# Patient Record
Sex: Male | Born: 1960 | Race: White | Hispanic: No | Smoking: Former smoker
Health system: Southern US, Community
[De-identification: ages and names within clinical notes are randomized; demographics above are authoritative.]

## PROBLEM LIST (undated history)

## (undated) DIAGNOSIS — K861 Other chronic pancreatitis: Secondary | ICD-10-CM

## (undated) DIAGNOSIS — L409 Psoriasis, unspecified: Secondary | ICD-10-CM

## (undated) DIAGNOSIS — K259 Gastric ulcer, unspecified as acute or chronic, without hemorrhage or perforation: Secondary | ICD-10-CM

## (undated) DIAGNOSIS — F329 Major depressive disorder, single episode, unspecified: Secondary | ICD-10-CM

## (undated) DIAGNOSIS — F1011 Alcohol abuse, in remission: Secondary | ICD-10-CM

## (undated) DIAGNOSIS — R29898 Other symptoms and signs involving the musculoskeletal system: Secondary | ICD-10-CM

## (undated) DIAGNOSIS — E785 Hyperlipidemia, unspecified: Secondary | ICD-10-CM

## (undated) DIAGNOSIS — F32A Depression, unspecified: Secondary | ICD-10-CM

## (undated) HISTORY — DX: Hyperlipidemia, unspecified: E78.5

## (undated) HISTORY — DX: Major depressive disorder, single episode, unspecified: F32.9

## (undated) HISTORY — DX: Psoriasis, unspecified: L40.9

## (undated) HISTORY — DX: Depression, unspecified: F32.A

## (undated) HISTORY — DX: Other chronic pancreatitis: K86.1

## (undated) HISTORY — DX: Alcohol abuse, in remission: F10.11

## (undated) HISTORY — DX: Other symptoms and signs involving the musculoskeletal system: R29.898

## (undated) HISTORY — PX: TYMPANOSTOMY TUBE PLACEMENT: SHX32

---

## 1997-11-15 ENCOUNTER — Inpatient Hospital Stay (HOSPITAL_COMMUNITY): Admission: EM | Admit: 1997-11-15 | Discharge: 1997-11-17 | Payer: Self-pay | Admitting: Emergency Medicine

## 1998-04-27 ENCOUNTER — Ambulatory Visit (HOSPITAL_COMMUNITY): Admission: RE | Admit: 1998-04-27 | Discharge: 1998-04-27 | Payer: Self-pay | Admitting: Family Medicine

## 1999-03-12 ENCOUNTER — Inpatient Hospital Stay (HOSPITAL_COMMUNITY): Admission: EM | Admit: 1999-03-12 | Discharge: 1999-03-18 | Payer: Self-pay | Admitting: Emergency Medicine

## 1999-03-12 ENCOUNTER — Encounter: Payer: Self-pay | Admitting: Emergency Medicine

## 1999-03-13 ENCOUNTER — Encounter: Payer: Self-pay | Admitting: Internal Medicine

## 1999-03-25 ENCOUNTER — Emergency Department (HOSPITAL_COMMUNITY): Admission: EM | Admit: 1999-03-25 | Discharge: 1999-03-25 | Payer: Self-pay

## 2000-02-13 ENCOUNTER — Inpatient Hospital Stay (HOSPITAL_COMMUNITY): Admission: EM | Admit: 2000-02-13 | Discharge: 2000-02-18 | Payer: Self-pay | Admitting: Emergency Medicine

## 2000-02-13 ENCOUNTER — Encounter: Payer: Self-pay | Admitting: Emergency Medicine

## 2000-02-26 ENCOUNTER — Emergency Department (HOSPITAL_COMMUNITY): Admission: EM | Admit: 2000-02-26 | Discharge: 2000-02-26 | Payer: Self-pay | Admitting: Emergency Medicine

## 2000-02-26 ENCOUNTER — Encounter: Payer: Self-pay | Admitting: Emergency Medicine

## 2000-02-28 ENCOUNTER — Encounter: Payer: Self-pay | Admitting: Internal Medicine

## 2000-02-28 ENCOUNTER — Inpatient Hospital Stay: Admission: EM | Admit: 2000-02-28 | Discharge: 2000-03-04 | Payer: Self-pay | Admitting: *Deleted

## 2000-03-21 ENCOUNTER — Inpatient Hospital Stay (HOSPITAL_COMMUNITY): Admission: EM | Admit: 2000-03-21 | Discharge: 2000-03-24 | Payer: Self-pay | Admitting: Emergency Medicine

## 2000-03-21 ENCOUNTER — Encounter: Payer: Self-pay | Admitting: Emergency Medicine

## 2000-03-25 ENCOUNTER — Emergency Department (HOSPITAL_COMMUNITY): Admission: EM | Admit: 2000-03-25 | Discharge: 2000-03-25 | Payer: Self-pay | Admitting: *Deleted

## 2000-05-19 ENCOUNTER — Inpatient Hospital Stay (HOSPITAL_COMMUNITY): Admission: EM | Admit: 2000-05-19 | Discharge: 2000-05-23 | Payer: Self-pay | Admitting: Emergency Medicine

## 2000-05-19 ENCOUNTER — Encounter: Payer: Self-pay | Admitting: Emergency Medicine

## 2000-05-20 ENCOUNTER — Encounter: Payer: Self-pay | Admitting: Internal Medicine

## 2000-05-22 ENCOUNTER — Encounter: Payer: Self-pay | Admitting: Internal Medicine

## 2000-07-13 ENCOUNTER — Encounter: Payer: Self-pay | Admitting: Emergency Medicine

## 2000-07-13 ENCOUNTER — Inpatient Hospital Stay (HOSPITAL_COMMUNITY): Admission: EM | Admit: 2000-07-13 | Discharge: 2000-07-16 | Payer: Self-pay | Admitting: Emergency Medicine

## 2000-07-19 ENCOUNTER — Inpatient Hospital Stay (HOSPITAL_COMMUNITY): Admission: EM | Admit: 2000-07-19 | Discharge: 2000-07-23 | Payer: Self-pay

## 2001-02-06 ENCOUNTER — Inpatient Hospital Stay (HOSPITAL_COMMUNITY): Admission: EM | Admit: 2001-02-06 | Discharge: 2001-02-10 | Payer: Self-pay | Admitting: Family Medicine

## 2001-02-17 ENCOUNTER — Inpatient Hospital Stay (HOSPITAL_COMMUNITY): Admission: EM | Admit: 2001-02-17 | Discharge: 2001-02-22 | Payer: Self-pay | Admitting: Emergency Medicine

## 2001-02-18 ENCOUNTER — Encounter: Payer: Self-pay | Admitting: Gastroenterology

## 2001-05-24 ENCOUNTER — Inpatient Hospital Stay (HOSPITAL_COMMUNITY): Admission: EM | Admit: 2001-05-24 | Discharge: 2001-05-27 | Payer: Self-pay | Admitting: Emergency Medicine

## 2001-06-20 ENCOUNTER — Emergency Department (HOSPITAL_COMMUNITY): Admission: EM | Admit: 2001-06-20 | Discharge: 2001-06-20 | Payer: Self-pay | Admitting: Emergency Medicine

## 2001-06-24 ENCOUNTER — Emergency Department (HOSPITAL_COMMUNITY): Admission: EM | Admit: 2001-06-24 | Discharge: 2001-06-25 | Payer: Self-pay | Admitting: Emergency Medicine

## 2001-07-06 ENCOUNTER — Inpatient Hospital Stay (HOSPITAL_COMMUNITY): Admission: EM | Admit: 2001-07-06 | Discharge: 2001-07-08 | Payer: Self-pay | Admitting: Psychiatry

## 2001-07-09 ENCOUNTER — Inpatient Hospital Stay (HOSPITAL_COMMUNITY): Admission: EM | Admit: 2001-07-09 | Discharge: 2001-07-19 | Payer: Self-pay | Admitting: *Deleted

## 2001-11-26 ENCOUNTER — Emergency Department (HOSPITAL_COMMUNITY): Admission: EM | Admit: 2001-11-26 | Discharge: 2001-11-26 | Payer: Self-pay | Admitting: Emergency Medicine

## 2002-01-13 ENCOUNTER — Emergency Department (HOSPITAL_COMMUNITY): Admission: EM | Admit: 2002-01-13 | Discharge: 2002-01-14 | Payer: Self-pay | Admitting: Emergency Medicine

## 2004-01-25 ENCOUNTER — Emergency Department (HOSPITAL_COMMUNITY): Admission: EM | Admit: 2004-01-25 | Discharge: 2004-01-25 | Payer: Self-pay | Admitting: Emergency Medicine

## 2004-02-16 ENCOUNTER — Emergency Department (HOSPITAL_COMMUNITY): Admission: EM | Admit: 2004-02-16 | Discharge: 2004-02-16 | Payer: Self-pay | Admitting: *Deleted

## 2004-03-10 ENCOUNTER — Emergency Department (HOSPITAL_COMMUNITY): Admission: EM | Admit: 2004-03-10 | Discharge: 2004-03-10 | Payer: Self-pay | Admitting: Emergency Medicine

## 2004-03-19 ENCOUNTER — Emergency Department (HOSPITAL_COMMUNITY): Admission: EM | Admit: 2004-03-19 | Discharge: 2004-03-19 | Payer: Self-pay | Admitting: Emergency Medicine

## 2004-06-18 ENCOUNTER — Emergency Department (HOSPITAL_COMMUNITY): Admission: EM | Admit: 2004-06-18 | Discharge: 2004-06-19 | Payer: Self-pay | Admitting: Emergency Medicine

## 2004-07-12 ENCOUNTER — Inpatient Hospital Stay (HOSPITAL_COMMUNITY): Admission: EM | Admit: 2004-07-12 | Discharge: 2004-07-19 | Payer: Self-pay | Admitting: Emergency Medicine

## 2004-07-25 ENCOUNTER — Inpatient Hospital Stay (HOSPITAL_COMMUNITY): Admission: EM | Admit: 2004-07-25 | Discharge: 2004-08-02 | Payer: Self-pay | Admitting: Emergency Medicine

## 2004-09-13 ENCOUNTER — Inpatient Hospital Stay (HOSPITAL_COMMUNITY): Admission: EM | Admit: 2004-09-13 | Discharge: 2004-09-15 | Payer: Self-pay | Admitting: Emergency Medicine

## 2004-10-06 ENCOUNTER — Emergency Department (HOSPITAL_COMMUNITY): Admission: EM | Admit: 2004-10-06 | Discharge: 2004-10-06 | Payer: Self-pay | Admitting: Emergency Medicine

## 2005-04-10 ENCOUNTER — Emergency Department (HOSPITAL_COMMUNITY): Admission: EM | Admit: 2005-04-10 | Discharge: 2005-04-11 | Payer: Self-pay | Admitting: Emergency Medicine

## 2005-06-25 ENCOUNTER — Emergency Department (HOSPITAL_COMMUNITY): Admission: EM | Admit: 2005-06-25 | Discharge: 2005-06-26 | Payer: Self-pay | Admitting: Emergency Medicine

## 2005-06-28 ENCOUNTER — Inpatient Hospital Stay (HOSPITAL_COMMUNITY): Admission: EM | Admit: 2005-06-28 | Discharge: 2005-07-02 | Payer: Self-pay | Admitting: Emergency Medicine

## 2005-06-28 ENCOUNTER — Encounter: Payer: Self-pay | Admitting: Internal Medicine

## 2005-07-01 ENCOUNTER — Ambulatory Visit: Payer: Self-pay | Admitting: Gastroenterology

## 2005-12-15 ENCOUNTER — Emergency Department (HOSPITAL_COMMUNITY): Admission: EM | Admit: 2005-12-15 | Discharge: 2005-12-15 | Payer: Self-pay | Admitting: Emergency Medicine

## 2005-12-17 ENCOUNTER — Inpatient Hospital Stay (HOSPITAL_COMMUNITY): Admission: EM | Admit: 2005-12-17 | Discharge: 2005-12-25 | Payer: Self-pay | Admitting: Emergency Medicine

## 2005-12-27 ENCOUNTER — Ambulatory Visit: Payer: Self-pay | Admitting: Family Medicine

## 2005-12-31 ENCOUNTER — Ambulatory Visit: Payer: Self-pay | Admitting: *Deleted

## 2006-05-27 ENCOUNTER — Ambulatory Visit (HOSPITAL_COMMUNITY): Admission: RE | Admit: 2006-05-27 | Discharge: 2006-05-27 | Payer: Self-pay | Admitting: *Deleted

## 2006-07-25 ENCOUNTER — Inpatient Hospital Stay (HOSPITAL_COMMUNITY): Admission: EM | Admit: 2006-07-25 | Discharge: 2006-08-01 | Payer: Self-pay | Admitting: Emergency Medicine

## 2006-07-25 ENCOUNTER — Ambulatory Visit: Payer: Self-pay | Admitting: *Deleted

## 2006-07-31 ENCOUNTER — Ambulatory Visit: Payer: Self-pay | Admitting: Gastroenterology

## 2006-12-28 ENCOUNTER — Emergency Department (HOSPITAL_COMMUNITY): Admission: EM | Admit: 2006-12-28 | Discharge: 2006-12-28 | Payer: Self-pay | Admitting: Emergency Medicine

## 2007-01-03 ENCOUNTER — Emergency Department (HOSPITAL_COMMUNITY): Admission: EM | Admit: 2007-01-03 | Discharge: 2007-01-03 | Payer: Self-pay | Admitting: Emergency Medicine

## 2007-04-23 DIAGNOSIS — F1010 Alcohol abuse, uncomplicated: Secondary | ICD-10-CM | POA: Insufficient documentation

## 2007-04-23 DIAGNOSIS — Z8719 Personal history of other diseases of the digestive system: Secondary | ICD-10-CM | POA: Insufficient documentation

## 2007-05-24 ENCOUNTER — Emergency Department (HOSPITAL_COMMUNITY): Admission: EM | Admit: 2007-05-24 | Discharge: 2007-05-25 | Payer: Self-pay | Admitting: Emergency Medicine

## 2007-07-26 ENCOUNTER — Emergency Department (HOSPITAL_COMMUNITY): Admission: EM | Admit: 2007-07-26 | Discharge: 2007-07-27 | Payer: Self-pay | Admitting: Emergency Medicine

## 2007-08-03 ENCOUNTER — Ambulatory Visit: Payer: Self-pay | Admitting: *Deleted

## 2007-08-03 ENCOUNTER — Inpatient Hospital Stay (HOSPITAL_COMMUNITY): Admission: EM | Admit: 2007-08-03 | Discharge: 2007-08-06 | Payer: Self-pay | Admitting: Emergency Medicine

## 2007-08-05 ENCOUNTER — Encounter (INDEPENDENT_AMBULATORY_CARE_PROVIDER_SITE_OTHER): Payer: Self-pay | Admitting: Internal Medicine

## 2007-08-12 ENCOUNTER — Ambulatory Visit: Payer: Self-pay | Admitting: Internal Medicine

## 2007-08-12 ENCOUNTER — Encounter (INDEPENDENT_AMBULATORY_CARE_PROVIDER_SITE_OTHER): Payer: Self-pay | Admitting: Internal Medicine

## 2007-08-13 DIAGNOSIS — E785 Hyperlipidemia, unspecified: Secondary | ICD-10-CM | POA: Insufficient documentation

## 2007-09-09 ENCOUNTER — Telehealth (INDEPENDENT_AMBULATORY_CARE_PROVIDER_SITE_OTHER): Payer: Self-pay | Admitting: Internal Medicine

## 2007-10-06 ENCOUNTER — Telehealth: Payer: Self-pay | Admitting: *Deleted

## 2007-10-08 ENCOUNTER — Emergency Department (HOSPITAL_COMMUNITY): Admission: EM | Admit: 2007-10-08 | Discharge: 2007-10-08 | Payer: Self-pay | Admitting: Emergency Medicine

## 2007-10-31 ENCOUNTER — Emergency Department (HOSPITAL_COMMUNITY): Admission: EM | Admit: 2007-10-31 | Discharge: 2007-11-01 | Payer: Self-pay | Admitting: Emergency Medicine

## 2007-11-02 ENCOUNTER — Ambulatory Visit: Payer: Self-pay | Admitting: *Deleted

## 2007-11-02 ENCOUNTER — Encounter (INDEPENDENT_AMBULATORY_CARE_PROVIDER_SITE_OTHER): Payer: Self-pay | Admitting: Internal Medicine

## 2007-11-18 ENCOUNTER — Emergency Department (HOSPITAL_COMMUNITY): Admission: EM | Admit: 2007-11-18 | Discharge: 2007-11-18 | Payer: Self-pay | Admitting: Emergency Medicine

## 2007-11-26 ENCOUNTER — Encounter (INDEPENDENT_AMBULATORY_CARE_PROVIDER_SITE_OTHER): Payer: Self-pay | Admitting: Internal Medicine

## 2007-11-26 ENCOUNTER — Ambulatory Visit: Payer: Self-pay | Admitting: Internal Medicine

## 2007-11-26 DIAGNOSIS — Z8719 Personal history of other diseases of the digestive system: Secondary | ICD-10-CM

## 2007-11-28 LAB — CONVERTED CEMR LAB
Cholesterol: 209 mg/dL — ABNORMAL HIGH (ref 0–200)
HDL: 44 mg/dL (ref >39)
LDL Cholesterol: 111 mg/dL — ABNORMAL HIGH (ref 0–99)
Triglycerides: 269 mg/dL — ABNORMAL HIGH (ref <150)

## 2007-12-01 ENCOUNTER — Telehealth (INDEPENDENT_AMBULATORY_CARE_PROVIDER_SITE_OTHER): Payer: Self-pay | Admitting: Internal Medicine

## 2007-12-03 ENCOUNTER — Ambulatory Visit (HOSPITAL_COMMUNITY): Admission: RE | Admit: 2007-12-03 | Discharge: 2007-12-03 | Payer: Self-pay | Admitting: Internal Medicine

## 2007-12-09 ENCOUNTER — Encounter (INDEPENDENT_AMBULATORY_CARE_PROVIDER_SITE_OTHER): Payer: Self-pay | Admitting: Internal Medicine

## 2007-12-09 ENCOUNTER — Ambulatory Visit: Payer: Self-pay | Admitting: *Deleted

## 2007-12-10 LAB — CONVERTED CEMR LAB: OCCULT 2: NEGATIVE

## 2007-12-21 ENCOUNTER — Telehealth: Payer: Self-pay | Admitting: *Deleted

## 2007-12-24 ENCOUNTER — Telehealth (INDEPENDENT_AMBULATORY_CARE_PROVIDER_SITE_OTHER): Payer: Self-pay | Admitting: Internal Medicine

## 2007-12-28 ENCOUNTER — Encounter (INDEPENDENT_AMBULATORY_CARE_PROVIDER_SITE_OTHER): Payer: Self-pay | Admitting: Internal Medicine

## 2007-12-28 ENCOUNTER — Ambulatory Visit: Payer: Self-pay | Admitting: Infectious Diseases

## 2008-01-15 ENCOUNTER — Emergency Department (HOSPITAL_COMMUNITY): Admission: EM | Admit: 2008-01-15 | Discharge: 2008-01-15 | Payer: Self-pay | Admitting: Emergency Medicine

## 2008-01-18 ENCOUNTER — Ambulatory Visit: Payer: Self-pay | Admitting: Gastroenterology

## 2008-01-18 ENCOUNTER — Encounter (INDEPENDENT_AMBULATORY_CARE_PROVIDER_SITE_OTHER): Payer: Self-pay | Admitting: Internal Medicine

## 2008-01-18 DIAGNOSIS — R195 Other fecal abnormalities: Secondary | ICD-10-CM

## 2008-01-20 ENCOUNTER — Telehealth (INDEPENDENT_AMBULATORY_CARE_PROVIDER_SITE_OTHER): Payer: Self-pay | Admitting: Internal Medicine

## 2008-01-22 ENCOUNTER — Encounter: Payer: Self-pay | Admitting: Internal Medicine

## 2008-01-26 ENCOUNTER — Telehealth (INDEPENDENT_AMBULATORY_CARE_PROVIDER_SITE_OTHER): Payer: Self-pay | Admitting: Internal Medicine

## 2008-01-26 ENCOUNTER — Encounter (INDEPENDENT_AMBULATORY_CARE_PROVIDER_SITE_OTHER): Payer: Self-pay | Admitting: Internal Medicine

## 2008-01-28 ENCOUNTER — Ambulatory Visit: Payer: Self-pay | Admitting: Gastroenterology

## 2008-02-10 ENCOUNTER — Ambulatory Visit: Payer: Self-pay | Admitting: Gastroenterology

## 2008-02-12 LAB — CONVERTED CEMR LAB
Fecal Occult Blood: NEGATIVE
OCCULT 2: POSITIVE — AB

## 2008-02-15 ENCOUNTER — Encounter: Payer: Self-pay | Admitting: Gastroenterology

## 2008-02-15 ENCOUNTER — Ambulatory Visit: Payer: Self-pay | Admitting: Gastroenterology

## 2008-02-15 ENCOUNTER — Telehealth (INDEPENDENT_AMBULATORY_CARE_PROVIDER_SITE_OTHER): Payer: Self-pay | Admitting: Internal Medicine

## 2008-02-15 LAB — CONVERTED CEMR LAB
Basophils Relative: 0.6 % (ref 0.0–3.0)
Eosinophils Relative: 2.1 % (ref 0.0–5.0)
HCT: 38.2 % — ABNORMAL LOW (ref 39.0–52.0)
Hemoglobin: 13.6 g/dL (ref 13.0–17.0)
Monocytes Absolute: 0.4 10*3/uL (ref 0.1–1.0)
Monocytes Relative: 3.7 % (ref 3.0–12.0)
Neutro Abs: 8.2 10*3/uL — ABNORMAL HIGH (ref 1.4–7.7)
RBC: 4.27 M/uL (ref 4.22–5.81)
WBC: 11.5 10*3/uL — ABNORMAL HIGH (ref 4.5–10.5)

## 2008-02-19 ENCOUNTER — Encounter: Payer: Self-pay | Admitting: Gastroenterology

## 2008-02-22 ENCOUNTER — Encounter (INDEPENDENT_AMBULATORY_CARE_PROVIDER_SITE_OTHER): Payer: Self-pay | Admitting: Internal Medicine

## 2008-02-24 ENCOUNTER — Ambulatory Visit: Payer: Self-pay | Admitting: Internal Medicine

## 2008-02-24 ENCOUNTER — Encounter (INDEPENDENT_AMBULATORY_CARE_PROVIDER_SITE_OTHER): Payer: Self-pay | Admitting: Internal Medicine

## 2008-02-24 DIAGNOSIS — K2960 Other gastritis without bleeding: Secondary | ICD-10-CM

## 2008-03-01 ENCOUNTER — Telehealth: Payer: Self-pay | Admitting: Licensed Clinical Social Worker

## 2008-03-07 ENCOUNTER — Telehealth: Payer: Self-pay | Admitting: Licensed Clinical Social Worker

## 2008-03-16 ENCOUNTER — Ambulatory Visit: Payer: Self-pay | Admitting: Internal Medicine

## 2008-03-16 ENCOUNTER — Inpatient Hospital Stay (HOSPITAL_COMMUNITY): Admission: EM | Admit: 2008-03-16 | Discharge: 2008-03-21 | Payer: Self-pay | Admitting: Emergency Medicine

## 2008-03-22 ENCOUNTER — Encounter (INDEPENDENT_AMBULATORY_CARE_PROVIDER_SITE_OTHER): Payer: Self-pay | Admitting: Internal Medicine

## 2008-03-22 ENCOUNTER — Telehealth (INDEPENDENT_AMBULATORY_CARE_PROVIDER_SITE_OTHER): Payer: Self-pay | Admitting: Internal Medicine

## 2008-03-29 ENCOUNTER — Telehealth: Payer: Self-pay | Admitting: Infectious Disease

## 2008-03-30 ENCOUNTER — Encounter: Payer: Self-pay | Admitting: Infectious Disease

## 2008-04-13 ENCOUNTER — Ambulatory Visit: Payer: Self-pay | Admitting: Internal Medicine

## 2008-04-13 ENCOUNTER — Encounter (INDEPENDENT_AMBULATORY_CARE_PROVIDER_SITE_OTHER): Payer: Self-pay | Admitting: Internal Medicine

## 2008-04-13 DIAGNOSIS — F10929 Alcohol use, unspecified with intoxication, unspecified: Secondary | ICD-10-CM

## 2008-04-13 DIAGNOSIS — K860 Alcohol-induced chronic pancreatitis: Secondary | ICD-10-CM

## 2008-04-29 ENCOUNTER — Inpatient Hospital Stay (HOSPITAL_COMMUNITY): Admission: EM | Admit: 2008-04-29 | Discharge: 2008-05-03 | Payer: Self-pay | Admitting: Emergency Medicine

## 2008-04-29 ENCOUNTER — Ambulatory Visit: Payer: Self-pay | Admitting: Internal Medicine

## 2008-05-17 ENCOUNTER — Encounter (INDEPENDENT_AMBULATORY_CARE_PROVIDER_SITE_OTHER): Payer: Self-pay | Admitting: Internal Medicine

## 2008-05-17 ENCOUNTER — Ambulatory Visit: Payer: Self-pay | Admitting: *Deleted

## 2008-05-17 LAB — CONVERTED CEMR LAB
BUN: 11 mg/dL (ref 6–23)
CO2: 18 meq/L — ABNORMAL LOW (ref 19–32)
Calcium: 9.2 mg/dL (ref 8.4–10.5)
Calcium: 9.4 mg/dL (ref 8.4–10.5)
Creatinine, Ser: 0.72 mg/dL (ref 0.40–1.50)
Glucose, Bld: 130 mg/dL — ABNORMAL HIGH (ref 70–99)
Sodium: 139 meq/L (ref 135–145)

## 2008-06-02 ENCOUNTER — Ambulatory Visit: Payer: Self-pay | Admitting: Internal Medicine

## 2008-06-02 ENCOUNTER — Encounter (INDEPENDENT_AMBULATORY_CARE_PROVIDER_SITE_OTHER): Payer: Self-pay | Admitting: Internal Medicine

## 2008-06-02 LAB — CONVERTED CEMR LAB
CO2: 19 meq/L (ref 19–32)
Calcium: 9.8 mg/dL (ref 8.4–10.5)
Chloride: 105 meq/L (ref 96–112)
Sodium: 141 meq/L (ref 135–145)

## 2008-06-08 ENCOUNTER — Encounter (INDEPENDENT_AMBULATORY_CARE_PROVIDER_SITE_OTHER): Payer: Self-pay | Admitting: Internal Medicine

## 2008-06-09 ENCOUNTER — Ambulatory Visit: Payer: Self-pay | Admitting: Internal Medicine

## 2008-06-15 ENCOUNTER — Telehealth (INDEPENDENT_AMBULATORY_CARE_PROVIDER_SITE_OTHER): Payer: Self-pay | Admitting: Internal Medicine

## 2008-06-28 ENCOUNTER — Emergency Department (HOSPITAL_COMMUNITY): Admission: EM | Admit: 2008-06-28 | Discharge: 2008-06-28 | Payer: Self-pay | Admitting: Emergency Medicine

## 2008-06-30 ENCOUNTER — Telehealth (INDEPENDENT_AMBULATORY_CARE_PROVIDER_SITE_OTHER): Payer: Self-pay | Admitting: Internal Medicine

## 2008-06-30 ENCOUNTER — Encounter (INDEPENDENT_AMBULATORY_CARE_PROVIDER_SITE_OTHER): Payer: Self-pay | Admitting: Internal Medicine

## 2008-07-13 ENCOUNTER — Telehealth: Payer: Self-pay | Admitting: *Deleted

## 2008-07-16 ENCOUNTER — Inpatient Hospital Stay (HOSPITAL_COMMUNITY): Admission: EM | Admit: 2008-07-16 | Discharge: 2008-07-21 | Payer: Self-pay | Admitting: Emergency Medicine

## 2008-07-16 ENCOUNTER — Ambulatory Visit: Payer: Self-pay | Admitting: *Deleted

## 2008-07-21 ENCOUNTER — Encounter: Payer: Self-pay | Admitting: Internal Medicine

## 2008-08-01 ENCOUNTER — Encounter: Payer: Self-pay | Admitting: Internal Medicine

## 2008-08-01 ENCOUNTER — Telehealth: Payer: Self-pay | Admitting: Internal Medicine

## 2008-08-02 ENCOUNTER — Encounter: Payer: Self-pay | Admitting: *Deleted

## 2008-08-18 ENCOUNTER — Telehealth (INDEPENDENT_AMBULATORY_CARE_PROVIDER_SITE_OTHER): Payer: Self-pay | Admitting: *Deleted

## 2008-08-18 ENCOUNTER — Emergency Department (HOSPITAL_COMMUNITY): Admission: EM | Admit: 2008-08-18 | Discharge: 2008-08-18 | Payer: Self-pay | Admitting: Emergency Medicine

## 2008-08-18 ENCOUNTER — Encounter (INDEPENDENT_AMBULATORY_CARE_PROVIDER_SITE_OTHER): Payer: Self-pay | Admitting: Internal Medicine

## 2008-08-25 ENCOUNTER — Telehealth: Payer: Self-pay | Admitting: *Deleted

## 2008-08-27 ENCOUNTER — Telehealth: Payer: Self-pay | Admitting: Internal Medicine

## 2008-08-27 ENCOUNTER — Emergency Department (HOSPITAL_COMMUNITY): Admission: EM | Admit: 2008-08-27 | Discharge: 2008-08-27 | Payer: Self-pay | Admitting: Emergency Medicine

## 2008-08-29 ENCOUNTER — Ambulatory Visit: Payer: Self-pay | Admitting: *Deleted

## 2008-08-29 ENCOUNTER — Encounter (INDEPENDENT_AMBULATORY_CARE_PROVIDER_SITE_OTHER): Payer: Self-pay | Admitting: Internal Medicine

## 2008-08-29 DIAGNOSIS — F419 Anxiety disorder, unspecified: Secondary | ICD-10-CM | POA: Insufficient documentation

## 2008-08-29 DIAGNOSIS — F411 Generalized anxiety disorder: Secondary | ICD-10-CM | POA: Insufficient documentation

## 2008-09-16 ENCOUNTER — Telehealth (INDEPENDENT_AMBULATORY_CARE_PROVIDER_SITE_OTHER): Payer: Self-pay | Admitting: Internal Medicine

## 2008-09-16 ENCOUNTER — Encounter (INDEPENDENT_AMBULATORY_CARE_PROVIDER_SITE_OTHER): Payer: Self-pay | Admitting: Internal Medicine

## 2008-09-19 ENCOUNTER — Encounter (INDEPENDENT_AMBULATORY_CARE_PROVIDER_SITE_OTHER): Payer: Self-pay | Admitting: Internal Medicine

## 2008-09-19 ENCOUNTER — Ambulatory Visit: Payer: Self-pay | Admitting: Internal Medicine

## 2008-10-03 ENCOUNTER — Telehealth: Payer: Self-pay | Admitting: *Deleted

## 2008-10-20 ENCOUNTER — Telehealth (INDEPENDENT_AMBULATORY_CARE_PROVIDER_SITE_OTHER): Payer: Self-pay | Admitting: Internal Medicine

## 2008-10-20 ENCOUNTER — Encounter (INDEPENDENT_AMBULATORY_CARE_PROVIDER_SITE_OTHER): Payer: Self-pay | Admitting: Internal Medicine

## 2008-10-20 ENCOUNTER — Ambulatory Visit: Payer: Self-pay | Admitting: Infectious Disease

## 2008-11-08 ENCOUNTER — Ambulatory Visit: Payer: Self-pay | Admitting: Gastroenterology

## 2008-11-08 LAB — CONVERTED CEMR LAB
Basophils Relative: 0 % (ref 0.0–3.0)
Eosinophils Absolute: 0 10*3/uL (ref 0.0–0.7)
Eosinophils Relative: 0.1 % (ref 0.0–5.0)
Hemoglobin: 13.4 g/dL (ref 13.0–17.0)
Lymphocytes Relative: 13.3 % (ref 12.0–46.0)
MCHC: 35.3 g/dL (ref 30.0–36.0)
MCV: 88.6 fL (ref 78.0–100.0)
Neutro Abs: 8.3 10*3/uL — ABNORMAL HIGH (ref 1.4–7.7)
Neutrophils Relative %: 85.6 % — ABNORMAL HIGH (ref 43.0–77.0)
RBC: 4.28 M/uL (ref 4.22–5.81)
WBC: 9.7 10*3/uL (ref 4.5–10.5)

## 2008-11-10 ENCOUNTER — Ambulatory Visit (HOSPITAL_COMMUNITY): Admission: RE | Admit: 2008-11-10 | Discharge: 2008-11-10 | Payer: Self-pay | Admitting: Gastroenterology

## 2008-11-17 ENCOUNTER — Encounter: Payer: Self-pay | Admitting: Internal Medicine

## 2008-11-17 ENCOUNTER — Telehealth (INDEPENDENT_AMBULATORY_CARE_PROVIDER_SITE_OTHER): Payer: Self-pay | Admitting: Internal Medicine

## 2008-11-18 ENCOUNTER — Ambulatory Visit: Payer: Self-pay | Admitting: Gastroenterology

## 2008-11-22 ENCOUNTER — Ambulatory Visit: Payer: Self-pay | Admitting: Gastroenterology

## 2008-11-24 ENCOUNTER — Telehealth: Payer: Self-pay | Admitting: Gastroenterology

## 2008-12-08 ENCOUNTER — Ambulatory Visit: Payer: Self-pay | Admitting: Gastroenterology

## 2008-12-15 ENCOUNTER — Telehealth: Payer: Self-pay | Admitting: *Deleted

## 2008-12-15 ENCOUNTER — Encounter: Payer: Self-pay | Admitting: Gastroenterology

## 2008-12-15 ENCOUNTER — Ambulatory Visit: Payer: Self-pay | Admitting: Gastroenterology

## 2008-12-15 ENCOUNTER — Encounter (INDEPENDENT_AMBULATORY_CARE_PROVIDER_SITE_OTHER): Payer: Self-pay | Admitting: Internal Medicine

## 2008-12-27 ENCOUNTER — Encounter (INDEPENDENT_AMBULATORY_CARE_PROVIDER_SITE_OTHER): Payer: Self-pay | Admitting: Internal Medicine

## 2008-12-27 ENCOUNTER — Ambulatory Visit: Payer: Self-pay | Admitting: Internal Medicine

## 2008-12-27 ENCOUNTER — Inpatient Hospital Stay (HOSPITAL_COMMUNITY): Admission: EM | Admit: 2008-12-27 | Discharge: 2008-12-30 | Payer: Self-pay | Admitting: Emergency Medicine

## 2008-12-28 ENCOUNTER — Encounter: Payer: Self-pay | Admitting: Gastroenterology

## 2008-12-28 ENCOUNTER — Encounter (INDEPENDENT_AMBULATORY_CARE_PROVIDER_SITE_OTHER): Payer: Self-pay | Admitting: *Deleted

## 2008-12-29 ENCOUNTER — Ambulatory Visit: Payer: Self-pay | Admitting: Gastroenterology

## 2008-12-29 ENCOUNTER — Encounter (INDEPENDENT_AMBULATORY_CARE_PROVIDER_SITE_OTHER): Payer: Self-pay | Admitting: Internal Medicine

## 2009-01-02 LAB — CONVERTED CEMR LAB
Fecal Occult Blood: POSITIVE
OCCULT 1: POSITIVE
OCCULT 2: POSITIVE
OCCULT 4: POSITIVE
OCCULT 5: POSITIVE

## 2009-01-13 ENCOUNTER — Telehealth (INDEPENDENT_AMBULATORY_CARE_PROVIDER_SITE_OTHER): Payer: Self-pay | Admitting: *Deleted

## 2009-01-13 ENCOUNTER — Encounter: Payer: Self-pay | Admitting: Internal Medicine

## 2009-02-10 ENCOUNTER — Telehealth: Payer: Self-pay | Admitting: *Deleted

## 2009-02-13 ENCOUNTER — Telehealth (INDEPENDENT_AMBULATORY_CARE_PROVIDER_SITE_OTHER): Payer: Self-pay | Admitting: *Deleted

## 2009-02-13 ENCOUNTER — Encounter: Payer: Self-pay | Admitting: Internal Medicine

## 2009-02-16 ENCOUNTER — Ambulatory Visit: Payer: Self-pay | Admitting: Internal Medicine

## 2009-02-16 LAB — CONVERTED CEMR LAB
BUN: 9 mg/dL (ref 6–23)
Calcium: 9.5 mg/dL (ref 8.4–10.5)
Chloride: 103 meq/L (ref 96–112)
Creatinine, Ser: 0.63 mg/dL (ref 0.40–1.50)

## 2009-03-09 ENCOUNTER — Ambulatory Visit: Payer: Self-pay | Admitting: Internal Medicine

## 2009-03-10 LAB — CONVERTED CEMR LAB
Cholesterol: 165 mg/dL (ref 0–200)
HDL: 28 mg/dL — ABNORMAL LOW (ref >39)
LDL Cholesterol: 77 mg/dL (ref 0–99)
Triglycerides: 300 mg/dL — ABNORMAL HIGH (ref <150)

## 2009-03-15 ENCOUNTER — Telehealth: Payer: Self-pay | Admitting: Internal Medicine

## 2009-03-16 ENCOUNTER — Encounter: Payer: Self-pay | Admitting: Internal Medicine

## 2009-03-17 ENCOUNTER — Ambulatory Visit: Payer: Self-pay | Admitting: Infectious Diseases

## 2009-04-04 ENCOUNTER — Emergency Department (HOSPITAL_COMMUNITY): Admission: EM | Admit: 2009-04-04 | Discharge: 2009-04-04 | Payer: Self-pay | Admitting: Emergency Medicine

## 2009-04-12 ENCOUNTER — Encounter: Payer: Self-pay | Admitting: Internal Medicine

## 2009-04-12 ENCOUNTER — Telehealth: Payer: Self-pay | Admitting: Internal Medicine

## 2009-05-05 ENCOUNTER — Ambulatory Visit: Payer: Self-pay | Admitting: Internal Medicine

## 2009-05-10 ENCOUNTER — Telehealth (INDEPENDENT_AMBULATORY_CARE_PROVIDER_SITE_OTHER): Payer: Self-pay | Admitting: *Deleted

## 2009-05-12 ENCOUNTER — Encounter: Payer: Self-pay | Admitting: Infectious Disease

## 2009-06-07 ENCOUNTER — Telehealth: Payer: Self-pay | Admitting: Internal Medicine

## 2009-06-08 ENCOUNTER — Encounter: Payer: Self-pay | Admitting: Internal Medicine

## 2009-06-09 ENCOUNTER — Emergency Department (HOSPITAL_COMMUNITY): Admission: EM | Admit: 2009-06-09 | Discharge: 2009-06-09 | Payer: Self-pay | Admitting: Emergency Medicine

## 2009-07-01 ENCOUNTER — Observation Stay (HOSPITAL_COMMUNITY): Admission: EM | Admit: 2009-07-01 | Discharge: 2009-07-01 | Payer: Self-pay | Admitting: Emergency Medicine

## 2009-07-06 ENCOUNTER — Telehealth: Payer: Self-pay | Admitting: Internal Medicine

## 2009-07-10 ENCOUNTER — Encounter: Payer: Self-pay | Admitting: Internal Medicine

## 2009-08-02 ENCOUNTER — Telehealth: Payer: Self-pay | Admitting: Internal Medicine

## 2009-08-07 ENCOUNTER — Telehealth: Payer: Self-pay | Admitting: Internal Medicine

## 2009-08-10 ENCOUNTER — Encounter: Payer: Self-pay | Admitting: Internal Medicine

## 2009-09-04 ENCOUNTER — Telehealth: Payer: Self-pay | Admitting: Internal Medicine

## 2009-09-05 ENCOUNTER — Encounter: Payer: Self-pay | Admitting: Internal Medicine

## 2009-10-05 ENCOUNTER — Ambulatory Visit: Payer: Self-pay | Admitting: Internal Medicine

## 2009-10-05 ENCOUNTER — Encounter: Payer: Self-pay | Admitting: Internal Medicine

## 2009-11-01 ENCOUNTER — Telehealth: Payer: Self-pay | Admitting: Internal Medicine

## 2009-11-03 ENCOUNTER — Ambulatory Visit: Payer: Self-pay | Admitting: Internal Medicine

## 2009-11-03 LAB — CONVERTED CEMR LAB
Amphetamine Screen, Ur: NEGATIVE
Barbiturate Quant, Ur: NEGATIVE
Cocaine Metabolites: NEGATIVE
Marijuana Metabolite: NEGATIVE
Methadone: NEGATIVE
Opiates: POSITIVE — AB

## 2009-11-29 ENCOUNTER — Telehealth: Payer: Self-pay | Admitting: Internal Medicine

## 2009-12-04 ENCOUNTER — Encounter: Payer: Self-pay | Admitting: Internal Medicine

## 2009-12-05 ENCOUNTER — Telehealth: Payer: Self-pay | Admitting: Internal Medicine

## 2009-12-05 ENCOUNTER — Emergency Department (HOSPITAL_COMMUNITY): Admission: EM | Admit: 2009-12-05 | Discharge: 2009-12-05 | Payer: Self-pay | Admitting: Emergency Medicine

## 2010-01-01 ENCOUNTER — Telehealth: Payer: Self-pay | Admitting: Internal Medicine

## 2010-01-05 ENCOUNTER — Encounter: Payer: Self-pay | Admitting: Internal Medicine

## 2010-01-29 ENCOUNTER — Telehealth: Payer: Self-pay | Admitting: Internal Medicine

## 2010-02-04 ENCOUNTER — Emergency Department (HOSPITAL_COMMUNITY): Admission: EM | Admit: 2010-02-04 | Discharge: 2010-02-05 | Payer: Self-pay | Admitting: Emergency Medicine

## 2010-02-05 ENCOUNTER — Encounter: Payer: Self-pay | Admitting: Internal Medicine

## 2010-02-05 ENCOUNTER — Telehealth: Payer: Self-pay | Admitting: Internal Medicine

## 2010-02-15 ENCOUNTER — Telehealth: Payer: Self-pay | Admitting: Gastroenterology

## 2010-02-20 ENCOUNTER — Ambulatory Visit: Payer: Self-pay | Admitting: Internal Medicine

## 2010-02-20 DIAGNOSIS — E875 Hyperkalemia: Secondary | ICD-10-CM

## 2010-02-20 LAB — CONVERTED CEMR LAB
Alkaline Phosphatase: 115 units/L (ref 39–117)
BUN: 7 mg/dL (ref 6–23)
CO2: 23 meq/L (ref 19–32)
Cholesterol: 165 mg/dL (ref 0–200)
Glucose, Bld: 107 mg/dL — ABNORMAL HIGH (ref 70–99)
HDL: 31 mg/dL — ABNORMAL LOW (ref >39)
LDL Cholesterol: 107 mg/dL — ABNORMAL HIGH (ref 0–99)
Sodium: 142 meq/L (ref 135–145)
Total Bilirubin: 0.3 mg/dL (ref 0.3–1.2)
Total Protein: 6.5 g/dL (ref 6.0–8.3)
Triglycerides: 133 mg/dL (ref <150)
VLDL: 27 mg/dL (ref 0–40)

## 2010-03-05 ENCOUNTER — Telehealth: Payer: Self-pay | Admitting: Internal Medicine

## 2010-03-08 ENCOUNTER — Encounter: Payer: Self-pay | Admitting: Internal Medicine

## 2010-03-23 ENCOUNTER — Observation Stay (HOSPITAL_COMMUNITY): Admission: EM | Admit: 2010-03-23 | Discharge: 2010-03-23 | Payer: Self-pay | Admitting: Emergency Medicine

## 2010-04-02 ENCOUNTER — Telehealth: Payer: Self-pay | Admitting: Internal Medicine

## 2010-04-05 ENCOUNTER — Encounter: Payer: Self-pay | Admitting: Internal Medicine

## 2010-04-07 ENCOUNTER — Emergency Department (HOSPITAL_COMMUNITY): Admission: EM | Admit: 2010-04-07 | Discharge: 2010-04-07 | Payer: Self-pay | Admitting: Emergency Medicine

## 2010-05-01 ENCOUNTER — Telehealth: Payer: Self-pay | Admitting: Internal Medicine

## 2010-05-07 ENCOUNTER — Encounter: Payer: Self-pay | Admitting: Internal Medicine

## 2010-05-08 ENCOUNTER — Encounter: Payer: Self-pay | Admitting: Internal Medicine

## 2010-05-08 ENCOUNTER — Ambulatory Visit: Payer: Self-pay | Admitting: Internal Medicine

## 2010-05-08 LAB — CONVERTED CEMR LAB
Amphetamine Screen, Ur: NEGATIVE
Barbiturate Quant, Ur: NEGATIVE
Cocaine Metabolites: NEGATIVE
Creatinine,U: 49.5 mg/dL
Methadone: NEGATIVE
Opiates: POSITIVE — AB

## 2010-06-01 ENCOUNTER — Telehealth: Payer: Self-pay | Admitting: Licensed Clinical Social Worker

## 2010-06-01 ENCOUNTER — Telehealth: Payer: Self-pay | Admitting: *Deleted

## 2010-06-06 ENCOUNTER — Encounter: Payer: Self-pay | Admitting: Internal Medicine

## 2010-06-28 ENCOUNTER — Encounter: Payer: Self-pay | Admitting: Licensed Clinical Social Worker

## 2010-06-29 ENCOUNTER — Encounter: Payer: Self-pay | Admitting: Licensed Clinical Social Worker

## 2010-07-02 ENCOUNTER — Telehealth: Payer: Self-pay | Admitting: Internal Medicine

## 2010-07-06 ENCOUNTER — Encounter: Payer: Self-pay | Admitting: Internal Medicine

## 2010-07-15 ENCOUNTER — Encounter: Payer: Self-pay | Admitting: *Deleted

## 2010-07-16 ENCOUNTER — Encounter: Payer: Self-pay | Admitting: Licensed Clinical Social Worker

## 2010-07-17 ENCOUNTER — Telehealth: Payer: Self-pay | Admitting: Internal Medicine

## 2010-07-24 NOTE — Letter (Signed)
Summary: Generic Letter  [facility]  [address]   [place], [place] [address]   Phone: [phone]  Fax: [phone]    10/05/2009  JAYD CADIEUX [address] [place], [place]  [address] To whom it may concern, This is to certify that Mr. Pate Aylward is a patient at the [facility] and under my care for last 2 years. 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 He also has several other chronic medical conditions. Due to severe financial constrains as well as expensive medications required for his medical care, he often has limited or no access to specialised medical care that he needs. It also puts lot of social and financial pressure and creates anxiety and depression. Any assistance which you could provide him would be greatly appreciated.  Please contact us if we can be of any further assistance.   Sincerely,   Riddhish Sherryll Burger MD

## 2010-07-24 NOTE — Medication Information (Signed)
Summary: MS.CONTIN/PERCOCET/  MS.CONTIN/PERCOCET/   Imported By: Margie Billet 03/06/2010 11:32:27  _____________________________________________________________________  External Attachment:    Type:   Image     Comment:   External Document

## 2010-07-24 NOTE — Medication Information (Signed)
Summary: PERCOCET/MS.CONTIN  PERCOCET/MS.CONTIN   Imported By: Margie Billet 03/08/2010 11:10:39  _____________________________________________________________________  External Attachment:    Type:   Image     Comment:   External Document

## 2010-07-24 NOTE — Medication Information (Signed)
Summary: Tax adviser   Imported By: Florinda Marker 08/10/2009 15:55:30  _____________________________________________________________________  External Attachment:    Type:   Image     Comment:   External Document

## 2010-07-24 NOTE — Medication Information (Signed)
Summary: PERCOCET/ MS.CONTIN  PERCOCET/ MS.CONTIN   Imported By: Margie Billet 12/05/2009 11:11:59  _____________________________________________________________________  External Attachment:    Type:   Image     Comment:   External Document

## 2010-07-24 NOTE — Progress Notes (Signed)
Summary: refill/gg     Phone Note Refill Request  on January 01, 2010 2:55 PM  Refills Requested: Medication #1:  PERCOCET 7.5-325 MG  TABS Take 1 tablet by mouth every 4 hours as needed for pain   Last Refilled: 12/04/2009  Medication #2:  MS CONTIN 60 MG XR12H-TAB Take 1 tablet by mouth two times a day   Last Refilled: 12/04/2009  Method Requested: Pick up at Office Initial call taken by: Merrie Roof RN,  January 01, 2010 2:56 PM  Follow-up for Phone Call        Not due till 15th.  Follow-up by: Clerance Lav MD,  January 03, 2010 12:37 AM     Appended Document: refill/gg       Clinical Lists Changes Scripts for one month given to patients mother. Julaine Fusi  DO  January 05, 2010 10:56 AM  Medications: Rx of PERCOCET 7.5-325 MG  TABS (OXYCODONE-ACETAMINOPHEN) Take 1 tablet by mouth every 4 hours as needed for pain;  #180 x 0;  Signed;  Entered by: Julaine Fusi  DO;  Authorized by: Julaine Fusi  DO;  Method used: Print then Give to Patient Rx of MS CONTIN 60 MG XR12H-TAB (MORPHINE SULFATE) Take 1 tablet by mouth two times a day;  #60 x 0;  Signed;  Entered by: Julaine Fusi  DO;  Authorized by: Julaine Fusi  DO;  Method used: Print then Give to Patient    Prescriptions: MS CONTIN 60 MG XR12H-TAB (MORPHINE SULFATE) Take 1 tablet by mouth two times a day  #60 x 0   Entered and Authorized by:   Julaine Fusi  DO   Signed by:   Julaine Fusi  DO on 01/05/2010   Method used:   Print then Give to Patient   RxID:   [id number] PERCOCET 7.5-325 MG  TABS (OXYCODONE-ACETAMINOPHEN) Take 1 tablet by mouth every 4 hours as needed for pain  #180 x 0   Entered and Authorized by:   Julaine Fusi  DO   Signed by:   Julaine Fusi  DO on 01/05/2010   Method used:   Print then Give to Patient   RxID:   [id number]

## 2010-07-24 NOTE — Medication Information (Signed)
Summary: Tax adviser   Imported By: Florinda Marker 07/11/2009 14:52:28  _____________________________________________________________________  External Attachment:    Type:   Image     Comment:   External Document

## 2010-07-24 NOTE — Medication Information (Signed)
Summary: [name]  PERCOCET /MSCONTIN   Imported By: Margie Billet 04/09/2010 14:56:25  _____________________________________________________________________  External Attachment:    Type:   Image     Comment:   External Document

## 2010-07-24 NOTE — Progress Notes (Signed)
Summary: refill/gg     Phone Note Refill Request  on August 07, 2009 1:32 PM  Refills Requested: Medication #1:  PERCOCET 7.5-325 MG  TABS Take 1 tablet by mouth every 4 hours as needed for pain   Last Refilled: 07/10/2009  Medication #2:  MS CONTIN 60 MG XR12H-TAB Take 1 tablet by mouth two times a day   Last Refilled: 07/10/2009 mom to pick up - alice   Method Requested: Pick up at Office Initial call taken by: Merrie Roof RN,  August 07, 2009 1:33 PM  Follow-up for Phone Call       Follow-up by: Clerance Lav MD,  August 10, 2009 10:19 AM    Prescriptions: MS CONTIN 60 MG XR12H-TAB (MORPHINE SULFATE) Take 1 tablet by mouth two times a day  #60 x 0   Entered and Authorized by:   Clerance Lav MD   Signed by:   Clerance Lav MD on 08/10/2009   Method used:   Print then Give to Patient   RxID:   [id number] PERCOCET 7.5-325 MG  TABS (OXYCODONE-ACETAMINOPHEN) Take 1 tablet by mouth every 4 hours as needed for pain  #180 x 0   Entered and Authorized by:   Clerance Lav MD   Signed by:   Clerance Lav MD on 08/10/2009   Method used:   Print then Give to Patient   RxID:   [id number]

## 2010-07-24 NOTE — Progress Notes (Signed)
Summary: pain issues/ hla  Phone Note Call from Patient   Summary of Call: pt's mother calls and states pt has been up all night "writhing" in the bed, states "i had some hydrocodone from my broken ribs and some phenergan and gave him some and it didn't do anything for him, he needs his medicine" reinterated that his narc scripts may be filled 6/15, placed her on hold and spoke w/ dr Phillips Odor, to instruct pt's mother to take pt to ED at [facility] and be evaluated there. pt's mother then states " i know we get this for free but he should not be treated bad because of that" i stated that payment or lack of ability to pay has nothing to do with the way that internal medicine pt's are treated and that she needs to take him to the ED. she states she will. Initial call taken by: Marin Roberts RN,  December 05, 2009 9:31 AM  Follow-up for Phone Call        Agree with plan. I have reviewed the history. This is recurring trend. Dr. Coralee Pesa spoke with patient yesterday. Meds are not due until tomorrow from pharmacy- he was given a post-dated prescription yesterday. If he is having this much pain then he needs to go to the ED for evaluation- ?ETOH, acute on chronic pancreatitis? Will also check [employer].  Follow-up by: Julaine Fusi  DO,  December 05, 2009 10:26 AM  Additional Follow-up for Phone Call Additional follow up Details #1::        Spoke with ED PA. Patient not in distress in ED. She will give some Ativan and send home. Additional Follow-up by: Julaine Fusi  DO,  December 05, 2009 11:30 AM     Appended Document: pain issues/ hla    Clinical Lists Changes  Directives: Removed directive of CHRONIC PANCREATITIS - CAN REFILL NARCOTICS EARLY, OTW - HE GETS MUCH WORSE AND IS ADMITTED

## 2010-07-24 NOTE — Progress Notes (Signed)
Summary: refill/ hla  Phone Note Refill Request Message from:  Patient on April 02, 2010 11:49 AM  Refills Requested: Medication #1:  PERCOCET 7.5-325 MG  TABS Take 1 tablet by mouth every 4 hours as needed for pain   Dosage confirmed as above?Dosage Confirmed   Last Refilled: 9/15  Medication #2:  MS CONTIN 60 MG XR12H-TAB Take 1 tablet by mouth two times a day   Dosage confirmed as above?Dosage Confirmed   Last Refilled: 9/15 last visit 01/2010  Initial call taken by: Marin Roberts RN,  April 02, 2010 11:49 AM  Follow-up for Phone Call        will refill and print on 10/13 Follow-up by: Clerance Lav MD,  April 03, 2010 9:13 PM  Additional Follow-up for Phone Call Additional follow up Details #1::        Rx printed with instruction not to fill earlier than 10/15    Prescriptions: MS CONTIN 60 MG XR12H-TAB (MORPHINE SULFATE) Take 1 tablet by mouth two times a day  #60 x 0   Entered and Authorized by:   Clerance Lav MD   Signed by:   Clerance Lav MD on 04/05/2010   Method used:   Print then Give to Patient   RxID:   [id number] PERCOCET 7.5-325 MG  TABS (OXYCODONE-ACETAMINOPHEN) Take 1 tablet by mouth every 4 hours as needed for pain  #180 x 0   Entered and Authorized by:   Clerance Lav MD   Signed by:   Clerance Lav MD on 04/05/2010   Method used:   Print then Give to Patient   RxID:   [id number]   Appended Document: refill/ hla RXs. ready to be pick up; pt. was called.

## 2010-07-24 NOTE — Progress Notes (Signed)
Summary: refill/ hla  Phone Note Refill Request Message from:  Patient on March 05, 2010 11:28 AM  Refills Requested: Medication #1:  PERCOCET 7.5-325 MG  TABS Take 1 tablet by mouth every 4 hours as needed for pain   Dosage confirmed as above?Dosage Confirmed   Last Refilled: 8/15  Medication #2:  MS CONTIN 60 MG XR12H-TAB Take 1 tablet by mouth two times a day   Dosage confirmed as above?Dosage Confirmed   Last Refilled: 8/15 Initial call taken by: Marin Roberts RN,  March 05, 2010 11:28 AM  Follow-up for Phone Call        Prescriptions given to pt. Follow-up by: Angelina Ok RN,  March 08, 2010 9:55 AM    Prescriptions: MS CONTIN 60 MG XR12H-TAB (MORPHINE SULFATE) Take 1 tablet by mouth two times a day  #60 x 0   Entered and Authorized by:   Zoila Shutter MD   Signed by:   Zoila Shutter MD on 03/08/2010   Method used:   Reprint   RxID:   [id number] PERCOCET 7.5-325 MG  TABS (OXYCODONE-ACETAMINOPHEN) Take 1 tablet by mouth every 4 hours as needed for pain  #180 x 0   Entered and Authorized by:   Zoila Shutter MD   Signed by:   Zoila Shutter MD on 03/08/2010   Method used:   Reprint   RxID:   [id number] MS CONTIN 60 MG XR12H-TAB (MORPHINE SULFATE) Take 1 tablet by mouth two times a day  #60 x 0   Entered by:   Zoila Shutter MD   Authorized by:   Clerance Lav MD   Signed by:   Zoila Shutter MD on 03/08/2010   Method used:   Print then Give to Patient   RxID:   [id number] PERCOCET 7.5-325 MG  TABS (OXYCODONE-ACETAMINOPHEN) Take 1 tablet by mouth every 4 hours as needed for pain  #180 x 0   Entered by:   Zoila Shutter MD   Authorized by:   Clerance Lav MD   Signed by:   Zoila Shutter MD on 03/08/2010   Method used:   Print then Give to Patient   RxID:   [id number]

## 2010-07-24 NOTE — Assessment & Plan Note (Signed)
Summary: EST-CK/FU/MEDS/CFB   Vital Signs:  Patient profile:   50 year old male Height:      68 inches (172.72 cm) Weight:      134.4 pounds (60.18 kg) BMI:     20.51 Temp:     97.4 degrees F (36.33 degrees C) oral Pulse rate:   105 / minute BP sitting:   125 / 85  (left arm) Cuff size:   regular  Vitals Entered By: Theotis Barrio NT II (October 05, 2009 9:10 AM) CC: MEDICATION REFILL- LEAVING FOR OUT OF TOWN -NEED PAIN MED(SON IS GRADUATING FROM [employer]) Is Patient Diabetic? No Pain Assessment Patient in pain? yes     Location: abdomen Intensity:       5 Type: HEAVY Onset of pain  Chronic  Have you ever been in a relationship where you felt threatened, hurt or afraid?No   Does patient need assistance? Functional Status Self care Ambulation Normal Comments MEDICATION REFILL  - LEAVING OUT OF TOWN TO DAY.   Primary Care Provider:  Clerance Lav MD  CC:  MEDICATION REFILL- LEAVING FOR OUT OF TOWN -NEED PAIN MED(SON IS GRADUATING FROM [employer]).  History of Present Illness: This is a  50  y/o man with PMH of chronic pancreatitis, h/o pancreatic pseudocyst, resolved by CT 2007 h/o CP during 07/2007 admission r/o for MI with neg. CE and EKG, tobacco abuse, emphysema psoriasis, heme + stool (followed by dr. Arlyce Dice) presenting for f/u. His psoriasis is progressive and itchy. He talks about how it impact his social life. His son is grduating from job [employer] and he needs to leave. he wants to pick up his prescription early.  he also requests financial hardship letter.   Preventive Screening-Counseling & Management  Alcohol-Tobacco     Alcohol drinks/day: 0     Smoking Status: current     Smoking Cessation Counseling: yes     Packs/Day: 1     Year Started: about 22 yrs  Caffeine-Diet-Exercise     Does Patient Exercise: yes     Type of exercise: WALKING     Times/week: 1-2  Current Medications (verified): 1)  Percocet 7.5-325 Mg  Tabs (Oxycodone-Acetaminophen) .... Take  1 Tablet By Mouth Every 4 Hours As Needed For Pain 2)  Promethazine Hcl 12.5 Mg  Tabs (Promethazine Hcl) .... Take 1 Tablet Every 4 Hours As Needed For Nausea 3)  Pancrease Mt 16 48-16-48 Mu  Cpep (Amylase-Lipase-Protease) .... Take5-6  Tablets Before Each Meal 4)  Pravachol 40 Mg  Tabs (Pravastatin Sodium) .... Take 1 Tablet By Mouth Once A Day 5)  Multivitamins   Tabs (Multiple Vitamin) .... Take 1 Tablet By Mouth Once A Day 6)  Fish Oil .... Take 3 Tablets By Mouth Once Daily 7)  Omeprazole 40 Mg Cpdr (Omeprazole) .... Take 1 Tablet By Mouth Two Times A Day 8)  Ms Contin 60 Mg Xr12h-Tab (Morphine Sulfate) .... Take 1 Tablet By Mouth Two Times A Day 9)  Ativan 0.5 Mg Tabs (Lorazepam) .... Take 1-2 Tablets By Mouth Every 2-4 Hours As Needed For Anxiety 10)  Amitriptyline Hcl 25 Mg Tabs (Amitriptyline Hcl) .... Take 1 Tablrt By Mouth At Bedtime For 1 Week, Then Increase To 2 Tablets A Day At Bedtime As Needed For Pain 11)  Ensure  Liqd (Nutritional Supplements) .... Take 1 Can By Mouth Three Times A Day (Strawberry) 12)  Clobetasol Propionate 0.05 % Oint (Clobetasol Propionate) .... Applied On Affected Area Twice A Day  Allergies (  verified): No Known Drug Allergies  Past History:  Past Medical History: Last updated: 01/18/2008 chronic pancreatitis h/o pancreatic pseudocyst, resolved by CT 2007 h/o CP during 07/2007 admission r/o for MI with neg. CE and EKG tobacco abuse  emphysema psoriazis et-oh abuse (quit 1.5 years ago) duodenitis by EGD 06/2005 RUE weakness and numbness likely from cervical spondylosis heme + stool Depression Hyperlipidemia  Past Surgical History: Last updated: 01/18/2008 Tubes in ears-age 22  Family History: Last updated: 01/18/2008 No FH of Colon Cancer: Family History of Diabetes: Mother Family History of Heart Disease: Father Father had ? kidney or liver cancer  Social History: Last updated: 11/22/2008 Occupation: unemployed Patient currently  smokes. -1 pack per day Alcohol Use - no Daily Caffeine Use-2 cups daily Illicit Drug Use - no Patient gets regular exercise.  Risk Factors: Alcohol Use: 0 (10/05/2009) Exercise: yes (10/05/2009)  Risk Factors: Smoking Status: current (10/05/2009) Packs/Day: 1 (10/05/2009)  Review of Systems      See HPI  Physical Exam  General:  alert and underweight appearing.   Head:  normocephalic and atraumatic.   Eyes:  vision grossly intact, pupils equal, pupils round, and pupils reactive to light.   Mouth:  pharynx pink and moist, no erythema, and no exudates.   Neck:  supple.   Lungs:  normal respiratory effort, no crackles, and no wheezes.  Distant heart sounds.  Heart:  regular rhythm, no murmur, no gallop, and tachycardia.  Distant HS.  Abdomen:  soft, normal bowel sounds, and no distention.  Diffusely TTP. Extremities:  no edema, extensive posriatic lesion over left legs, buttock, right elbow Neurologic:  No cranial nerve deficits noted. Station and gait are normal. Plantar reflexes are down-going bilaterally. DTRs are symmetrical throughout. Sensory, motor and coordinative functions appear intact.   Impression & Recommendations:  Problem # 1:  PSORIASIS (ICD-696.1) stable. no changes made. Financial hardship letter was given.   Problem # 2:  CHRONIC PANCREATITIS (ICD-577.1) stable. narcotic prescription given. I will do random drug screen on next refill.   Problem # 3:  TOBACCO ABUSE (ICD-305.1)  continues to smoke. I did cessation counseling. He beleives he is not ready to quit completely. I have offered help.   Complete Medication List: 1)  Percocet 7.5-325 Mg Tabs (Oxycodone-acetaminophen) .... Take 1 tablet by mouth every 4 hours as needed for pain 2)  Promethazine Hcl 12.5 Mg Tabs (Promethazine hcl) .... Take 1 tablet every 4 hours as needed for nausea 3)  Pancrease Mt 16 48-16-48 Mu Cpep (Amylase-lipase-protease) .... Take5-6  tablets before each meal 4)  Pravachol  40 Mg Tabs (Pravastatin sodium) .... Take 1 tablet by mouth once a day 5)  Multivitamins Tabs (Multiple vitamin) .... Take 1 tablet by mouth once a day 6)  Fish Oil  .... Take 3 tablets by mouth once daily 7)  Omeprazole 40 Mg Cpdr (Omeprazole) .... Take 1 tablet by mouth two times a day 8)  Ms Contin 60 Mg Xr12h-tab (Morphine sulfate) .... Take 1 tablet by mouth two times a day 9)  Ativan 0.5 Mg Tabs (Lorazepam) .... Take 1-2 tablets by mouth every 2-4 hours as needed for anxiety 10)  Amitriptyline Hcl 25 Mg Tabs (Amitriptyline hcl) .... Take 1 tablrt by mouth at bedtime for 1 week, then increase to 2 tablets a day at bedtime as needed for pain 11)  Ensure Liqd (Nutritional supplements) .... Take 1 can by mouth three times a day (strawberry) 12)  Clobetasol Propionate 0.05 % Oint (Clobetasol propionate) .Marland KitchenMarland KitchenMarland Kitchen  Applied on affected area twice a day  Patient Instructions: 1)  Please schedule a follow-up appointment in 3 months. Prescriptions: PERCOCET 7.5-325 MG  TABS (OXYCODONE-ACETAMINOPHEN) Take 1 tablet by mouth every 4 hours as needed for pain  #180 x 0   Entered and Authorized by:   Clerance Lav MD   Signed by:   Clerance Lav MD on 10/05/2009   Method used:   Print then Give to Patient   RxID:   [id number] MS CONTIN 60 MG XR12H-TAB (MORPHINE SULFATE) Take 1 tablet by mouth two times a day  #60 x 0   Entered and Authorized by:   Clerance Lav MD   Signed by:   Clerance Lav MD on 10/05/2009   Method used:   Print then Give to Patient   RxID:   [id number]    Prevention & Chronic Care Immunizations   Influenza vaccine: Not documented    Tetanus booster: Not documented   Tetanus booster due: 02/23/2015    Pneumococcal vaccine: Not documented  Other Screening   Smoking status: current  (10/05/2009)   Smoking cessation counseling: yes  (10/05/2009)  Lipids   Total Cholesterol: 165  (03/09/2009)   LDL: 77  (03/09/2009)   LDL Direct: Not documented   HDL: 28   (03/09/2009)   Triglycerides: 300  (03/09/2009)    SGOT (AST): Not documented   SGPT (ALT): Not documented   Alkaline phosphatase: Not documented   Total bilirubin: Not documented  Self-Management Support :   Personal Goals (by the next clinic visit) :      Personal LDL goal: 100  (03/17/2009)    Patient will work on the following items until the next clinic visit to reach self-care goals:     Medications and monitoring: take my medicines every day  (10/05/2009)     Eating: eat more vegetables, use fresh or frozen vegetables, eat foods that are low in salt, eat baked foods instead of fried foods, eat fruit for snacks and desserts, limit or avoid alcohol  (10/05/2009)     Activity: take a 30 minute walk every day  (10/05/2009)    Lipid self-management support: Resources for patients handout  (10/05/2009)        Resource handout printed.

## 2010-07-24 NOTE — Medication Information (Signed)
Summary: Tax adviser   Imported By: Louretta Parma 01/11/2010 16:18:09  _____________________________________________________________________  External Attachment:    Type:   Image     Comment:   External Document

## 2010-07-24 NOTE — Progress Notes (Signed)
Summary: refill/ hla  Phone Note Refill Request Message from:  Patient on July 06, 2009 5:10 PM  Refills Requested: Medication #1:  PERCOCET 7.5-325 MG  TABS Take 1 tablet by mouth every 4 hours as needed for pain   Last Refilled: 12/16  Medication #2:  MS CONTIN 60 MG XR12H-TAB Take 1 tablet by mouth two times a day   Last Refilled: 12/16 Initial call taken by: Marin Roberts RN,  July 06, 2009 5:10 PM  Follow-up for Phone Call        seems to be a stable regimen ok to refill. Come by attending office for scripts. Follow-up by: Julaine Fusi  DO,  July 07, 2009 12:55 PM    Prescriptions: MS CONTIN 60 MG XR12H-TAB (MORPHINE SULFATE) Take 1 tablet by mouth two times a day  #60 x 0   Entered and Authorized by:   Julaine Fusi  DO   Signed by:   Julaine Fusi  DO on 07/10/2009   Method used:   Print then Give to Patient   RxID:   [id number] PERCOCET 7.5-325 MG  TABS (OXYCODONE-ACETAMINOPHEN) Take 1 tablet by mouth every 4 hours as needed for pain  #180 x 0   Entered and Authorized by:   Julaine Fusi  DO   Signed by:   Julaine Fusi  DO on 07/10/2009   Method used:   Print then Give to Patient   RxID:   [id number]

## 2010-07-24 NOTE — Progress Notes (Signed)
Summary: Refill/gh  Phone Note Refill Request Message from:  Fax from Pharmacy on January 29, 2010 11:44 AM  Refills Requested: Medication #1:  AMITRIPTYLINE HCL 25 MG TABS take 1 tablrt by mouth at bedtime for 1 week   Last Refilled: 12/26/2009 Last office 10/05/2009.  Last labs were 02/16/2009.   Method Requested: Fax to Local Pharmacy Initial call taken by: Angelina Ok RN,  January 29, 2010 11:44 AM  Follow-up for Phone Call        Last seen in April. Was to F/U 3 months. Will refill. Sent flag to [facility] to make appt. Follow-up by: Blanch Media MD,  January 29, 2010 1:39 PM    New/Updated Medications: AMITRIPTYLINE HCL 25 MG TABS (AMITRIPTYLINE HCL) 2 tablets a day at bedtime by mouth as needed for pain Prescriptions: AMITRIPTYLINE HCL 25 MG TABS (AMITRIPTYLINE HCL) 2 tablets a day at bedtime by mouth as needed for pain  #62 x 2   Entered and Authorized by:   Blanch Media MD   Signed by:   Blanch Media MD on 01/29/2010   Method used:   Faxed to ...       [facility] (retail)       [address] [place]       [address]       [place], [place]  [address]       Ph: [phone]       Fax: [phone]   RxID:   [id number]

## 2010-07-24 NOTE — Assessment & Plan Note (Signed)
Summary: labs   Process Orders Check Orders Results:     [facility]: ABN not required for this insurance Tests Sent for requisitioning (May 08, 2010 11:26 AM):     05/08/2010: [facility] -- T- * Misc. Laboratory test [phone] (signed)     05/07/2010: [facility] -- T-Drug Screen-Urine, (single) [[id number]] (signed)

## 2010-07-24 NOTE — Progress Notes (Signed)
Summary: refill/ hla  Phone Note Refill Request Message from:  Fax from Pharmacy on August 02, 2009 2:01 PM  Refills Requested: Medication #1:  AMITRIPTYLINE HCL 25 MG TABS take 1 tablrt by mouth at bedtime for 1 week   Dosage confirmed as above?Dosage Confirmed   Last Refilled: 07/03/2009 Initial call taken by: Marin Roberts RN,  August 02, 2009 2:02 PM  Follow-up for Phone Call        Refill approved-nurse to complete Follow-up by: Clerance Lav MD,  August 03, 2009 9:32 AM    Prescriptions: AMITRIPTYLINE HCL 25 MG TABS (AMITRIPTYLINE HCL) take 1 tablrt by mouth at bedtime for 1 week, then increase to 2 tablets a day at bedtime as needed for pain  #60 x 5   Entered and Authorized by:   Clerance Lav MD   Signed by:   Clerance Lav MD on 08/03/2009   Method used:   Telephoned to ...       [facility] (retail)       [address] [place]       [address]       [place], [place]  [address]       Ph: [phone]       Fax: [phone]   RxID:   [id number]

## 2010-07-24 NOTE — Progress Notes (Signed)
Summary: refill/gg  Phone Note Refill Request  on February 05, 2010 11:16 AM  Refills Requested: Medication #1:  PERCOCET 7.5-325 MG  TABS Take 1 tablet by mouth every 4 hours as needed for pain   Last Refilled: 01/05/2010  Medication #2:  MS CONTIN 60 MG XR12H-TAB Take 1 tablet by mouth two times a day   Last Refilled: 01/05/2010 call when ready @  [phone] when ready.   Method Requested: Pick up at Office Initial call taken by: Merrie Roof RN,  February 05, 2010 11:17 AM  Additional Follow-up for Phone Call Additional follow up Details #1::        Pt called Rx ready Additional Follow-up by: Merrie Roof RN,  February 05, 2010 12:04 PM    Prescriptions: MS CONTIN 60 MG XR12H-TAB (MORPHINE SULFATE) Take 1 tablet by mouth two times a day  #60 x 0   Entered and Authorized by:   Zoila Shutter MD   Signed by:   Zoila Shutter MD on 02/05/2010   Method used:   Reprint   RxID:   [id number] PERCOCET 7.5-325 MG  TABS (OXYCODONE-ACETAMINOPHEN) Take 1 tablet by mouth every 4 hours as needed for pain  #180 x 0   Entered and Authorized by:   Zoila Shutter MD   Signed by:   Zoila Shutter MD on 02/05/2010   Method used:   Reprint   RxID:   [id number] MS CONTIN 60 MG XR12H-TAB (MORPHINE SULFATE) Take 1 tablet by mouth two times a day  #60 x 0   Entered by:   Zoila Shutter MD   Authorized by:   Clerance Lav MD   Signed by:   Zoila Shutter MD on 02/05/2010   Method used:   Print then Give to Patient   RxID:   [id number] PERCOCET 7.5-325 MG  TABS (OXYCODONE-ACETAMINOPHEN) Take 1 tablet by mouth every 4 hours as needed for pain  #180 x 0   Entered by:   Zoila Shutter MD   Authorized by:   Clerance Lav MD   Signed by:   Zoila Shutter MD on 02/05/2010   Method used:   Print then Give to Patient   RxID:   [id number]

## 2010-07-24 NOTE — Progress Notes (Signed)
Summary: refill/gg  Phone Note Refill Request  on Nov 01, 2009 3:51 PM  Refills Requested: Medication #1:  PERCOCET 7.5-325 MG  TABS Take 1 tablet by mouth every 4 hours as needed for pain   Last Refilled: 10/05/2009  Medication #2:  MS CONTIN 60 MG XR12H-TAB Take 1 tablet by mouth two times a day   Last Refilled: 10/05/2009 call when ready @ [phone]   Method Requested: Pick up at Office Initial call taken by: Merrie Roof RN,  Nov 01, 2009 3:52 PM  Follow-up for Phone Call        Will refill and get random UDS today as documented by Dr. Sherryll Burger at last visit. Follow-up by: Julaine Fusi  DO,  Nov 03, 2009 10:46 AM  Additional Follow-up for Phone Call Additional follow up Details #1::        Rx given to patient obtained urine for labs Additional Follow-up by: Merrie Roof RN,  Nov 03, 2009 11:00 AM    Prescriptions: MS CONTIN 60 MG XR12H-TAB (MORPHINE SULFATE) Take 1 tablet by mouth two times a day  #60 x 0   Entered and Authorized by:   Julaine Fusi  DO   Signed by:   Julaine Fusi  DO on 11/03/2009   Method used:   Print then Give to Patient   RxID:   [id number] PERCOCET 7.5-325 MG  TABS (OXYCODONE-ACETAMINOPHEN) Take 1 tablet by mouth every 4 hours as needed for pain  #180 x 0   Entered and Authorized by:   Julaine Fusi  DO   Signed by:   Julaine Fusi  DO on 11/03/2009   Method used:   Print then Give to Patient   RxID:   [id number]   Appended Document: Orders Update Pt pain contract dictates narcotics on 16th-17th. Last time I filled his prescription early with instruction to fill it on 16th. Thus he is not due till monday. Next time when he comes in, we will need to make sure that he is not early refilling his prescription. riddhish   Clinical Lists Changes  Orders: Added new Test order of T- * Misc. Laboratory test [phone]) - Signed      Process Orders Check Orders Results:     [facility]: ABN not required for this  insurance Tests Sent for requisitioning (Nov 03, 2009 11:20 AM):     11/03/2009: [facility] -- T- * Misc. Laboratory test [phone] (signed)   Appended Document: refill/gg Riddish- I would have been more than happy to write fill on the 16th on the script. I think perhaps a good way to know this would be for [place] to have a single documentation area under "chronic pain" and we should keep this info there. I did UDS him as you requested in your last note. In the long run 2-3 days wont make a huge deal- I get concerned when it is > or+ week before. I think we expect very mild alterations in how teh meds are taken. I get worried with the breakthrough meds getting filled early- which means we are definately not controlling his pain. Could I suggest going up on the dose of his long acting and significantly reducing the number of percocet pills you are giving him each month? Thanks, [employer]

## 2010-07-24 NOTE — Progress Notes (Signed)
Summary: Omeprazole refill  Phone Note Refill Request Message from:  Fax from Pharmacy on February 15, 2010 8:30 AM  Refills Requested: Medication #1:  OMEPRAZOLE 40 MG CPDR Take 1 tablet by mouth two times a day   Dosage confirmed as above?Dosage Confirmed   Brand Name Necessary? No   Supply Requested: 1 year  Method Requested: Electronic Initial call taken by: Merri Ray CMA (AAMA),  February 15, 2010 8:30 AM    New/Updated Medications: OMEPRAZOLE 40 MG CPDR (OMEPRAZOLE) Take 1 tablet by mouth two times a day Prescriptions: OMEPRAZOLE 40 MG CPDR (OMEPRAZOLE) Take 1 tablet by mouth two times a day  #60 x 11   Entered by:   Merri Ray CMA (AAMA)   Authorized by:   Louis Meckel MD   Signed by:   Merri Ray CMA (AAMA) on 02/15/2010   Method used:   Faxed to ...       [facility] (retail)       [address]       [address]       [place], [place]  [address]       Ph: [phone]       Fax: [phone]   RxID:   [id number]

## 2010-07-24 NOTE — Medication Information (Signed)
Summary: RX Folder  RX Folder   Imported By: Louretta Parma 01/11/2010 15:59:10  _____________________________________________________________________  External Attachment:    Type:   Image     Comment:   External Document

## 2010-07-24 NOTE — Medication Information (Signed)
Summary: Tax adviser   Imported By: Florinda Marker 09/11/2009 15:15:42  _____________________________________________________________________  External Attachment:    Type:   Image     Comment:   External Document

## 2010-07-24 NOTE — Progress Notes (Signed)
Summary: refill / hla    Phone Note Refill Request Message from:  Patient on May 01, 2010 2:18 PM  Refills Requested: Medication #1:  PERCOCET 7.5-325 MG  TABS Take 1 tablet by mouth every 4 hours as needed for pain   Dosage confirmed as above?Dosage Confirmed   Last Refilled: 10/13  Medication #2:  MS CONTIN 60 MG XR12H-TAB Take 1 tablet by mouth two times a day   Dosage confirmed as above?Dosage Confirmed   Last Refilled: 10/13 he was to have a f/u appt in november and has not scheduled this, last visit 01/2010  Initial call taken by: Marin Roberts RN,  May 01, 2010 2:18 PM  Follow-up for Phone Call        needs urine drug screen when comes to collect the script. Will write one on 11/13 Follow-up by: Clerance Lav MD,  May 04, 2010 2:18 PM    Prescriptions: AMITRIPTYLINE HCL 25 MG TABS (AMITRIPTYLINE HCL) 2 tablets a day at bedtime by mouth as needed for pain  #60 x 2   Entered and Authorized by:   Clerance Lav MD   Signed by:   Clerance Lav MD on 05/07/2010   Method used:   Print then Give to Patient   RxID:   [id number] MS CONTIN 60 MG XR12H-TAB (MORPHINE SULFATE) Take 1 tablet by mouth two times a day  #60 x 0   Entered and Authorized by:   Clerance Lav MD   Signed by:   Clerance Lav MD on 05/07/2010   Method used:   Print then Give to Patient   RxID:   [id number] PERCOCET 7.5-325 MG  TABS (OXYCODONE-ACETAMINOPHEN) Take 1 tablet by mouth every 4 hours as needed for pain  #180 x 0   Entered and Authorized by:   Clerance Lav MD   Signed by:   Clerance Lav MD on 05/07/2010   Method used:   Print then Give to Patient   RxID:   [id number]   Appended Document: refill / hla   Urine specimen collected; Rxs. given to pt.

## 2010-07-24 NOTE — Assessment & Plan Note (Signed)
Summary: FU VISIT/DS   Vital Signs:  Patient profile:   50 year old male Height:      68 inches (172.72 cm) Weight:      129.01 pounds (58.64 kg) BMI:     19.69 Temp:     97.5 degrees F (36.39 degrees C) oral Pulse rate:   120 / minute BP sitting:   118 / 82  (right arm) Cuff size:   regular  Vitals Entered By: Angelina Ok RN (February 20, 2010 8:34 AM) CC: Depression Is Patient Diabetic? No Pain Assessment Patient in pain? yes     Location: upper abdomen Intensity: 3 Type: aching Onset of pain  Constant Nutritional Status BMI of 19 -24 = normal  Have you ever been in a relationship where you felt threatened, hurt or afraid?No   Does patient need assistance? Functional Status Self care Ambulation Normal Comments  Said that his Potassium was high.  Needs labs for.  Needs refil on Phenergen.  Check up.    Primary Care Provider:  Clerance Lav MD  CC:  Depression.  History of Present Illness: This is a  50  y/o man with PMH of chronic pancreatitis, h/o pancreatic pseudocyst, resolved by CT 2007 h/o CP during 07/2007 admission r/o for MI with neg. CE and EKG, tobacco abuse, emphysema psoriasis, heme + stool (followed by dr. Arlyce Dice) presenting for f/u.He recently visited ER and told that he was hyperkalemic, however review of the lab data shows that the sample was hemolysed and he is never known to be heperkalemic.  His psoriasis is stable.  His pancreatic pain is stable.   Depression History:      The patient denies a depressed mood most of the day and a diminished interest in his usual daily activities.         Preventive Screening-Counseling & Management  Alcohol-Tobacco     Smoking Cessation Counseling: yes  Current Medications (verified): 1)  Percocet 7.5-325 Mg  Tabs (Oxycodone-Acetaminophen) .... Take 1 Tablet By Mouth Every 4 Hours As Needed For Pain 2)  Promethazine Hcl 12.5 Mg  Tabs (Promethazine Hcl) .... Take 1 Tablet Every 4 Hours As Needed For  Nausea 3)  Pancrease Mt 16 48-16-48 Mu  Cpep (Amylase-Lipase-Protease) .... Take5-6  Tablets Before Each Meal 4)  Multivitamins   Tabs (Multiple Vitamin) .... Take 1 Tablet By Mouth Once A Day 5)  Fish Oil .... Take 3 Tablets By Mouth Once Daily 6)  Omeprazole 40 Mg Cpdr (Omeprazole) .... Take 1 Tablet By Mouth Two Times A Day 7)  Ms Contin 60 Mg Xr12h-Tab (Morphine Sulfate) .... Take 1 Tablet By Mouth Two Times A Day 8)  Amitriptyline Hcl 25 Mg Tabs (Amitriptyline Hcl) .... 2 Tablets A Day At Bedtime By Mouth As Needed For Pain 9)  Clobetasol Propionate 0.05 % Oint (Clobetasol Propionate) .... Applied On Affected Area Twice A Day  Allergies (verified): No Known Drug Allergies  Past History:  Past Medical History: Last updated: 01/18/2008 chronic pancreatitis h/o pancreatic pseudocyst, resolved by CT 2007 h/o CP during 07/2007 admission r/o for MI with neg. CE and EKG tobacco abuse  emphysema psoriazis et-oh abuse (quit 1.5 years ago) duodenitis by EGD 06/2005 RUE weakness and numbness likely from cervical spondylosis heme + stool Depression Hyperlipidemia  Past Surgical History: Last updated: 01/18/2008 Tubes in ears-age 51  Family History: Last updated: 01/18/2008 No FH of Colon Cancer: Family History of Diabetes: Mother Family History of Heart Disease: Father Father had ? kidney or  liver cancer  Social History: Last updated: 11/22/2008 Occupation: unemployed Patient currently smokes. -1 pack per day Alcohol Use - no Daily Caffeine Use-2 cups daily Illicit Drug Use - no Patient gets regular exercise.  Risk Factors: Alcohol Use: 0 (10/05/2009) Exercise: yes (10/05/2009)  Risk Factors: Smoking Status: current (10/05/2009) Packs/Day: 1 (10/05/2009)  Review of Systems      See HPI  Physical Exam  General:  alert and underweight appearing.   Head:  normocephalic and atraumatic.   Eyes:  vision grossly intact, pupils equal, pupils round, and pupils reactive  to light.   Mouth:  pharynx pink and moist, no erythema, and no exudates.   Neck:  supple.   Lungs:  normal respiratory effort, no crackles, and no wheezes.  Distant heart sounds.  Heart:  regular rhythm, no murmur, no gallop, and tachycardia.  Distant HS.  Abdomen:  soft, normal bowel sounds, and no distention.  Diffusely TTP. Extremities:  no edema, extensive posriatic lesion over left legs, buttock, right elbow, no scaling at present Neurologic:  No cranial nerve deficits noted. Station and gait are normal. Plantar reflexes are down-going bilaterally. DTRs are symmetrical throughout. Sensory, motor and coordinative functions appear intact. Psych:  Oriented X3 and normally interactive.     Impression & Recommendations:  Problem # 1:  PSORIASIS (ICD-696.1) stable. somewhat improved. He is still waiting for the disability approval and does not have access to medications. Continue to use local steroids as needed.  Orders: T-CMP with Estimated GFR ([id number])  Problem # 2:  ABDOMINAL PAIN, CHRONIC (ICD-789.00) stable. occassional flares.   Problem # 3:  CHRONIC PANCREATITIS (ICD-577.1) stable. Nausea was prominent recently. Refilled anti-emetic.  Orders: T-CMP with Estimated GFR ([id number])  Problem # 4:  DYSLIPIDEMIA (ICD-272.4) LDL mildly elevated. No cardiac risk factor known. will not restart it.  The following medications were removed from the medication list:    Pravachol 40 Mg Tabs (Pravastatin sodium) .Marland Kitchen... Take 1 tablet by mouth once a day    HDL:28 (03/09/2009), 44 (11/26/2007)  LDL:77 (03/09/2009), 86  --  07/16/2008 (08/02/2008)  Chol:165 (03/09/2009), 209 (11/26/2007)  Trig:300 (03/09/2009), 269 (11/26/2007)  The following medications were removed from the medication list:    Pravachol 40 Mg Tabs (Pravastatin sodium) .Marland Kitchen... Take 1 tablet by mouth once a day  Orders: T-Lipid Profile ([id number])  Problem # 5:  HYPERKALEMIA (ICD-276.7) No evidence of hyperkalemia on  repeat testing. Continue to monitor.  Orders: T-CMP with Estimated GFR ([id number])  Complete Medication List: 1)  Percocet 7.5-325 Mg Tabs (Oxycodone-acetaminophen) .... Take 1 tablet by mouth every 4 hours as needed for pain 2)  Promethazine Hcl 12.5 Mg Tabs (Promethazine hcl) .... Take 1 tablet every 4 hours as needed for nausea 3)  Pancrease Mt 16 48-16-48 Mu Cpep (Amylase-lipase-protease) .... Take5-6  tablets before each meal 4)  Multivitamins Tabs (Multiple vitamin) .... Take 1 tablet by mouth once a day 5)  Fish Oil  .... Take 3 tablets by mouth once daily 6)  Omeprazole 40 Mg Cpdr (Omeprazole) .... Take 1 tablet by mouth two times a day 7)  Ms Contin 60 Mg Xr12h-tab (Morphine sulfate) .... Take 1 tablet by mouth two times a day 8)  Amitriptyline Hcl 25 Mg Tabs (Amitriptyline hcl) .... 2 tablets a day at bedtime by mouth as needed for pain 9)  Clobetasol Propionate 0.05 % Oint (Clobetasol propionate) .... Applied on affected area twice a day  Other Orders: Influenza Vaccine NON MCR ([id number])  Patient Instructions: 1)  Please schedule a follow-up appointment in 3 months. 2)  Tobacco is very bad for your health and your loved ones! You Should stop smoking!. 3)  Stop Smoking Tips: Choose a Quit date. Cut down before the Quit date. decide what you will do as a substitute when you feel the urge to smoke(gum,toothpick,exercise). Prescriptions: PROMETHAZINE HCL 12.5 MG  TABS (PROMETHAZINE HCL) take 1 tablet every 4 hours as needed for nausea  #30 x 3   Entered and Authorized by:   Clerance Lav MD   Signed by:   Clerance Lav MD on 02/20/2010   Method used:   Print then Give to Patient   RxID:   [id number]    Vital Signs:  Patient profile:   50 year old male Height:      68 inches (172.72 cm) Weight:      129.01 pounds (58.64 kg) BMI:     19.69 Temp:     97.5 degrees F (36.39 degrees C) oral Pulse rate:   120 / minute BP sitting:   118 / 82  (right arm) Cuff size:    regular  Vitals Entered By: Angelina Ok RN (February 20, 2010 8:34 AM)   Prevention & Chronic Care Immunizations   Influenza vaccine: Fluvax Non-MCR  (02/20/2010)    Tetanus booster: Not documented   Tetanus booster due: 02/23/2015    Pneumococcal vaccine: Not documented   Pneumococcal vaccine deferral: Not indicated  (02/20/2010)  Other Screening   Smoking status: current  (10/05/2009)   Smoking cessation counseling: yes  (02/20/2010)  Lipids   Total Cholesterol: 165  (03/09/2009)   Lipid panel action/deferral: Lipid Panel ordered   LDL: 77  (03/09/2009)   LDL Direct: Not documented   HDL: 28  (03/09/2009)   Triglycerides: 300  (03/09/2009)    SGOT (AST): Not documented   SGPT (ALT): Not documented   Alkaline phosphatase: Not documented   Total bilirubin: Not documented    Lipid flowsheet reviewed?: Yes   Progress toward LDL goal: Unchanged  Self-Management Support :   Personal Goals (by the next clinic visit) :      Personal LDL goal: 100  (03/17/2009)    Patient will work on the following items until the next clinic visit to reach self-care goals:     Medications and monitoring: take my medicines every day, bring all of my medications to every visit  (02/20/2010)     Eating: drink diet soda or water instead of juice or soda, eat more vegetables, use fresh or frozen vegetables, eat foods that are low in salt, eat baked foods instead of fried foods, eat fruit for snacks and desserts, limit or avoid alcohol  (02/20/2010)     Activity: take a 30 minute walk every day  (02/20/2010)    Lipid self-management support: Written self-care plan, Education handout, Pre-printed educational material, Resources for patients handout  (02/20/2010)   Lipid self-care plan printed.   Lipid education handout printed      Resource handout printed.   Nursing Instructions: Give Flu vaccine today   Process Orders Check Orders Results:     [facility]: ABN not  required for this insurance Tests Sent for requisitioning (February 21, 2010 7:28 AM):     02/20/2010: [facility] -- T-Lipid Profile [phone] (signed)     02/20/2010: [facility] -- T-CMP with Estimated GFR [[id number]] (signed)       Immunizations Administered:  Influenza Vaccine # 1:    Vaccine Type: Fluvax  Non-MCR    Site: right deltoid    Mfr: GlaxoSmithKline    Dose: 0.5 ml    Route: IM    Given by: Angelina Ok RN    Exp. Date: 12/22/2010    Lot #: [id number]    VIS given: 01/15/07 version given February 20, 2010.  Flu Vaccine Consent Questions:    Do you have a history of severe allergic reactions to this vaccine? no    Any prior history of allergic reactions to egg and/or gelatin? no    Do you have a sensitivity to the preservative Thimersol? no    Do you have a past history of Guillan-Barre Syndrome? no    Do you currently have an acute febrile illness? no    Have you ever had a severe reaction to latex? no    Vaccine information given and explained to patient? yes

## 2010-07-24 NOTE — Progress Notes (Signed)
Summary: refill/ hla  Phone Note Refill Request Message from:  Patient on September 04, 2009 10:11 AM  Refills Requested: Medication #1:  PERCOCET 7.5-325 MG  TABS Take 1 tablet by mouth every 4 hours as needed for pain   Last Refilled: 2/17  Medication #2:  MS CONTIN 60 MG XR12H-TAB Take 1 tablet by mouth two times a day   Last Refilled: 2/17 Initial call taken by: Marin Roberts RN,  September 04, 2009 10:12 AM  Follow-up for Phone Call       Follow-up by: Clerance Lav MD,  September 05, 2009 1:58 PM    Prescriptions: PERCOCET 7.5-325 MG  TABS (OXYCODONE-ACETAMINOPHEN) Take 1 tablet by mouth every 4 hours as needed for pain  #180 x 0   Entered and Authorized by:   Clerance Lav MD   Signed by:   Clerance Lav MD on 09/05/2009   Method used:   Print then Give to Patient   RxID:   [id number] MS CONTIN 60 MG XR12H-TAB (MORPHINE SULFATE) Take 1 tablet by mouth two times a day  #60 x 0   Entered and Authorized by:   Clerance Lav MD   Signed by:   Clerance Lav MD on 09/05/2009   Method used:   Print then Give to Patient   RxID:   [id number]

## 2010-07-24 NOTE — Medication Information (Signed)
Summary: PERCOCET /AMITRIPTYLINE/MS. CONTIN  PERCOCET /AMITRIPTYLINE/MS. CONTIN   Imported By: Margie Billet 05/11/2010 11:53:07  _____________________________________________________________________  External Attachment:    Type:   Image     Comment:   External Document

## 2010-07-24 NOTE — Progress Notes (Signed)
Summary: refill/gg       Phone Note Refill Request  on November 29, 2009 12:15 PM  Refills Requested: Medication #1:  PERCOCET 7.5-325 MG  TABS Take 1 tablet by mouth every 4 hours as needed for pain   Last Refilled: 11/03/2009  Medication #2:  MS CONTIN 60 MG XR12H-TAB Take 1 tablet by mouth two times a day   Last Refilled: 11/03/2009 call when ready   Method Requested: Pick up at Office Initial call taken by: Merrie Roof RN,  November 29, 2009 12:16 PM  Follow-up for Phone Call        Not due till 16th. See prior notes. He is pushing it 1-2 days early every time for various rasons. Please inform him that his next prescription will be available on 15th to be filled on 16th.  Follow-up by: Clerance Lav MD,  December 01, 2009 7:36 PM

## 2010-07-26 NOTE — Letter (Signed)
Summary: F/U Letter  [facility]  [address]   [place], [place] [address]   Phone: [phone]  Fax: [phone]    06/29/2010    EADEN HETTINGER [address] [place], [place]  [address]  Dear Mr. Muchmore,  Please call me at [phone] to reschedule your appointment for smoking cessation or we can do the session over the phone to see how ready you are to quit smoking and then figure out a plan.   I also want to share with you some great news about the [facility]uit[facility] which may of great help to you.  It's certainly OK if you are not ready to quit but if you still are, I want to support you in your efforts to get cigarettes out of your life!  Thanks and Happy New Year!!  Sincerely,    Dorothe Pea, LCSW [facility] Cessation Leader [phone]

## 2010-07-26 NOTE — Miscellaneous (Signed)
Summary: Smoking Cessation  Sent [name] a handout about the [facility]uit[facility] and that they are now offering free NRT--patches or lozenges.

## 2010-07-26 NOTE — Progress Notes (Signed)
Summary: refill/gg    due 1/14  Phone Note Refill Request  on July 02, 2010 11:53 AM  Refills Requested: Medication #1:  PERCOCET 7.5-325 MG  TABS Take 1 tablet by mouth every 4 hours as needed for pain   Last Refilled: 06/13/2010  Medication #2:  MS CONTIN 60 MG XR12H-TAB Take 1 tablet by mouth two times a day   Last Refilled: 06/06/2010 Call pt @ [phone]   Pt wants to get Friday    Method Requested: Pick up at Office Initial call taken by: Merrie Roof RN,  July 02, 2010 11:53 AM  Follow-up for Phone Call       Follow-up by: Clerance Lav MD,  July 06, 2010 2:13 PM    Prescriptions: MS CONTIN 60 MG XR12H-TAB (MORPHINE SULFATE) Take 1 tablet by mouth two times a day  #60 x 0   Entered by:   Clerance Lav MD   Authorized by:   Merrie Roof RN   Signed by:   Clerance Lav MD on 07/06/2010   Method used:   Print then Give to Patient   RxID:   [id number] PERCOCET 7.5-325 MG  TABS (OXYCODONE-ACETAMINOPHEN) Take 1 tablet by mouth every 4 hours as needed for pain  #180 x 0   Entered by:   Clerance Lav MD   Authorized by:   Merrie Roof RN   Signed by:   Clerance Lav MD on 07/06/2010   Method used:   Print then Give to Patient   RxID:   [id number]

## 2010-07-26 NOTE — Progress Notes (Signed)
  Phone Note Outgoing Call   Summary of Call: 15 minutes.  Discussed smoking cessation counseling and tips with [name].  He is interested in the patch.  He will meet with me on Jan. 5th at 10 Am for counseling and planning for a quit date.

## 2010-07-26 NOTE — Medication Information (Signed)
Summary: MSCONTIN/PERCOCET  MSCONTIN/PERCOCET   Imported By: Margie Billet 06/12/2010 13:31:13  _____________________________________________________________________  External Attachment:    Type:   Image     Comment:   External Document

## 2010-07-26 NOTE — Progress Notes (Signed)
Summary: refill/ hla  Phone Note Refill Request Message from:  Patient on June 01, 2010 11:10 AM  Refills Requested: Medication #1:  PERCOCET 7.5-325 MG  TABS Take 1 tablet by mouth every 4 hours as needed for pain   Dosage confirmed as above?Dosage Confirmed   Last Refilled: 11/15  Medication #2:  MS CONTIN 60 MG XR12H-TAB Take 1 tablet by mouth two times a day   Dosage confirmed as above?Dosage Confirmed   Last Refilled: 11/15 Initial call taken by: Marin Roberts RN,  June 01, 2010 11:11 AM  Additional Follow-up for Phone Call Additional follow up Details #1::        Rx given to patient Additional Follow-up by: Marin Roberts RN,  June 06, 2010 5:37 PM    Prescriptions: MS CONTIN 60 MG XR12H-TAB (MORPHINE SULFATE) Take 1 tablet by mouth two times a day  #60 x 0   Entered by:   Zoila Shutter MD   Authorized by:   Clerance Lav MD   Signed by:   Zoila Shutter MD on 06/06/2010   Method used:   Print then Give to Patient   RxID:   [id number] PERCOCET 7.5-325 MG  TABS (OXYCODONE-ACETAMINOPHEN) Take 1 tablet by mouth every 4 hours as needed for pain  #180 x 0   Entered by:   Zoila Shutter MD   Authorized by:   Clerance Lav MD   Signed by:   Zoila Shutter MD on 06/06/2010   Method used:   Print then Give to Patient   RxID:   [id number]

## 2010-07-26 NOTE — Medication Information (Signed)
Summary: PERCOCET/MSCONTIN  PERCOCET/MSCONTIN   Imported By: Margie Billet 07/11/2010 11:34:09  _____________________________________________________________________  External Attachment:    Type:   Image     Comment:   External Document

## 2010-07-26 NOTE — Miscellaneous (Signed)
Summary: Smoking Cessation  [name] did not show up for his smoking cessation appmt today.  I will follow-up with him re: the [place] Quitline.

## 2010-07-26 NOTE — Progress Notes (Signed)
Summary: Refill/gh  Phone Note Refill Request Message from:  Fax from Pharmacy on July 17, 2010 2:15 PM  Refills Requested: Medication #1:  CLOBETASOL PROPIONATE 0.05 % OINT applied on affected area twice a day. Original prescription written 03/17/2009 is out of date.   Method Requested: Fax to Local Pharmacy Initial call taken by: Angelina Ok RN,  July 17, 2010 2:15 PM  Follow-up for Phone Call       Follow-up by: Clerance Lav MD,  July 17, 2010 2:58 PM    Prescriptions: CLOBETASOL PROPIONATE 0.05 % OINT (CLOBETASOL PROPIONATE) applied on affected area twice a day  #1 x 6   Entered and Authorized by:   Clerance Lav MD   Signed by:   Clerance Lav MD on 07/17/2010   Method used:   Faxed to ...       [facility] (retail)       [address] [place]       [address]       [place], [place]  [address]       Ph: [phone]       Fax: [phone]   RxID:   [id number]

## 2010-07-27 ENCOUNTER — Encounter: Payer: Self-pay | Admitting: Internal Medicine

## 2010-07-27 ENCOUNTER — Ambulatory Visit: Admit: 2010-07-27 | Payer: Self-pay

## 2010-07-27 ENCOUNTER — Ambulatory Visit (INDEPENDENT_AMBULATORY_CARE_PROVIDER_SITE_OTHER): Payer: Self-pay | Admitting: Internal Medicine

## 2010-07-27 VITALS — BP 119/83 | HR 113 | Temp 97.7°F | Ht 68.0 in | Wt 124.7 lb

## 2010-07-27 DIAGNOSIS — K861 Other chronic pancreatitis: Secondary | ICD-10-CM

## 2010-07-27 DIAGNOSIS — F1010 Alcohol abuse, uncomplicated: Secondary | ICD-10-CM

## 2010-07-27 DIAGNOSIS — F17200 Nicotine dependence, unspecified, uncomplicated: Secondary | ICD-10-CM

## 2010-07-27 DIAGNOSIS — E785 Hyperlipidemia, unspecified: Secondary | ICD-10-CM

## 2010-07-27 DIAGNOSIS — L408 Other psoriasis: Secondary | ICD-10-CM

## 2010-07-27 NOTE — Progress Notes (Deleted)
Psoriasis Patient complains of psoriasis.  Symptoms have been ongoing for about a few years and have been gradually worsening. The patient reports symptoms of scaling, primarily affecting the arms, knees, legs, scalp, fingernails, trunk, feet. Lesions appear to be exacerbated by stress. Treatments tried so far include topical steroid (***), result partial clearing, with inadequate improvement. History of other significant skin problems: no. Family History of skin disease: no.  Pancreatitis Patient complains of chronic pancreatitis. Symptoms have been present 5 years. Symptoms include abdominal pain. Symptoms have been gradually worsening.  The pain is located in the epigastrium, is described as throbbing, moderate, and is rated worst day -7/ 10. The pain radiates to the LUQ. The patient states that the pain is relieved by meditation. Aggravating factors include food ingestion. The patient has tried analgesics with moderate relief.  Alcohol history:none.   No Known Allergies   Past Medical History  Diagnosis Date  . Chronic pancreatitis     No family history on file.

## 2010-07-27 NOTE — Patient Instructions (Signed)
Return in 1 month

## 2010-07-27 NOTE — Assessment & Plan Note (Signed)
Stable, no recent hospitalization. Taking replacement enzymes.

## 2010-07-27 NOTE — Assessment & Plan Note (Addendum)
Progressing over period of time, worsening over extremities. He is unable to pay for expensive therapy without disability. He is hoping to qualify for medicare but it will take long time. I will resend him to the Child psychotherapist and see if he can qualify for medicaid/medicare somehow with new health care law provisions. He is on topical creams (steroid) but that only helps control scaling. He has extensive itching and I am afraid he will get secondary infection from it.

## 2010-07-27 NOTE — Assessment & Plan Note (Signed)
Has not had any drink in last few years.

## 2010-07-27 NOTE — Assessment & Plan Note (Signed)
Patient is trying to quit on his own and has not been successful yet. Provided counseling and encouragement.

## 2010-07-27 NOTE — Progress Notes (Signed)
  Subjective:    Patient ID: [name], male    DOB: 06-08-1961, 50 y.o.   MRN: [id number]  HPI Patient complains of psoriasis. Symptoms have been ongoing for about a few years and have been gradually worsening. The patient reports symptoms of scaling, primarily affecting the arms, knees, legs, scalp, fingernails, trunk, feet. Lesions appear to be exacerbated by stress. Treatments tried so far include topical steroid, result partial clearing, with inadequate improvement. History of other significant skin problems: no. Family History of skin disease: no.  Pancreatitis  Patient complains of chronic pancreatitis. Symptoms have been present 5 years. Symptoms include abdominal pain. Symptoms have been gradually worsening. The pain is located in the epigastrium, is described as throbbing, moderate, and is rated worst day -7/ 10. The pain radiates to the LUQ. The patient states that the pain is relieved by meditation. Aggravating factors include food ingestion. The patient has tried analgesics with moderate relief. Alcohol history:none.    Review of Systems Negative, per HPI    Objective:   Physical Exam  Constitutional: He is oriented to person, place, and time. He appears well-developed.  HENT:  Head: Normocephalic and atraumatic.  Eyes: EOM are normal. Pupils are equal, round, and reactive to light.  Neck: Normal range of motion. Neck supple.  Cardiovascular: Normal rate and regular rhythm.   Pulmonary/Chest: Effort normal. He has no wheezes. He has no rales. He exhibits no tenderness.  Abdominal: Soft. He exhibits no distension and no mass. There is tenderness. There is no rebound and no guarding.  Neurological: He is alert and oriented to person, place, and time.  Skin: Rash noted.          Multiple areas of scaling and rash on flexor surfaces of arm extending to forearm, thigh on both lower extremities           Assessment & Plan:

## 2010-07-27 NOTE — Assessment & Plan Note (Signed)
Stable. Not on any meds.

## 2010-07-28 LAB — DRUGS OF ABUSE SCREEN W/O ALC, ROUTINE URINE
Benzodiazepines.: POSITIVE — AB
Methadone: NEGATIVE
Propoxyphene: NEGATIVE

## 2010-08-01 LAB — BENZODIAZEPINE, QUANTITATIVE, URINE
Alprazolam (GC/LC/MS), ur confirm: NEGATIVE NG/ML
Lorazepam: NEGATIVE NG/ML
Temazapam: NEGATIVE NG/ML

## 2010-08-01 LAB — OPIATE, QUANTITATIVE, URINE
Codeine Urine: NEGATIVE NG/ML
Morphine Urine: 22066 NG/ML — ABNORMAL HIGH
Oxymorphone: 1992 NG/ML — ABNORMAL HIGH

## 2010-08-02 ENCOUNTER — Other Ambulatory Visit: Payer: Self-pay | Admitting: *Deleted

## 2010-08-03 MED ORDER — MORPHINE SULFATE CR 60 MG PO TB12: 60.0000 mg | ORAL_TABLET | Freq: Two times a day (BID) | ORAL | Status: DC

## 2010-08-03 MED ORDER — OXYCODONE-ACETAMINOPHEN 7.5-325 MG PO TABS: 1.0000 | ORAL_TABLET | ORAL | Status: DC | PRN

## 2010-08-06 ENCOUNTER — Other Ambulatory Visit: Payer: Self-pay | Admitting: Internal Medicine

## 2010-08-06 MED ORDER — OXYCODONE-ACETAMINOPHEN 7.5-325 MG PO TABS: 1.0000 | ORAL_TABLET | ORAL | Status: DC | PRN

## 2010-08-06 NOTE — Progress Notes (Signed)
Patient given script for wrong number. New script given #180.

## 2010-08-29 ENCOUNTER — Other Ambulatory Visit: Payer: Self-pay | Admitting: *Deleted

## 2010-08-30 MED ORDER — OXYCODONE-ACETAMINOPHEN 7.5-325 MG PO TABS: 1.0000 | ORAL_TABLET | ORAL | Status: DC | PRN

## 2010-08-30 MED ORDER — MORPHINE SULFATE CR 60 MG PO TB12: 60.0000 mg | ORAL_TABLET | Freq: Two times a day (BID) | ORAL | Status: DC

## 2010-09-05 LAB — COMPREHENSIVE METABOLIC PANEL
ALT: 8 U/L (ref 0–53)
AST: 14 U/L (ref 0–37)
Alkaline Phosphatase: 96 U/L (ref 39–117)
CO2: 20 mEq/L (ref 19–32)
Chloride: 110 mEq/L (ref 96–112)
GFR calc Af Amer: >60 mL/min (ref >60)
GFR calc non Af Amer: >60 mL/min (ref >60)
Sodium: 137 mEq/L (ref 135–145)
Total Bilirubin: 0.4 mg/dL (ref 0.3–1.2)

## 2010-09-05 LAB — CBC
Hemoglobin: 14.3 g/dL (ref 13.0–17.0)
MCH: 31.9 pg (ref 26.0–34.0)
RBC: 4.48 MIL/uL (ref 4.22–5.81)

## 2010-09-05 LAB — LIPASE, BLOOD: Lipase: 42 U/L (ref 11–59)

## 2010-09-05 LAB — DIFFERENTIAL
Monocytes Absolute: 0.5 10*3/uL (ref 0.1–1.0)
Monocytes Relative: 5 % (ref 3–12)

## 2010-09-05 LAB — ETHANOL: Alcohol, Ethyl (B): <5 mg/dL (ref 0–10)

## 2010-09-05 LAB — URINALYSIS, ROUTINE W REFLEX MICROSCOPIC
Ketones, ur: NEGATIVE mg/dL
Nitrite: NEGATIVE
Protein, ur: NEGATIVE mg/dL

## 2010-09-05 LAB — PROTIME-INR: Prothrombin Time: 13.3 seconds (ref 11.6–15.2)

## 2010-09-05 LAB — APTT: aPTT: 33 seconds (ref 24–37)

## 2010-09-06 LAB — CBC
MCV: 91 fL (ref 78.0–100.0)
Platelets: 241 10*3/uL (ref 150–400)
RBC: 4.65 MIL/uL (ref 4.22–5.81)
WBC: 18 10*3/uL — ABNORMAL HIGH (ref 4.0–10.5)

## 2010-09-06 LAB — URINALYSIS, ROUTINE W REFLEX MICROSCOPIC
Bilirubin Urine: NEGATIVE
Glucose, UA: NEGATIVE mg/dL
Ketones, ur: NEGATIVE mg/dL
Leukocytes, UA: NEGATIVE
Nitrite: NEGATIVE
Protein, ur: NEGATIVE mg/dL
Specific Gravity, Urine: 1.02 (ref 1.005–1.030)
Urobilinogen, UA: 0.2 mg/dL (ref 0.0–1.0)
pH: 6 (ref 5.0–8.0)

## 2010-09-06 LAB — COMPREHENSIVE METABOLIC PANEL
ALT: <8 U/L (ref 0–53)
AST: 18 U/L (ref 0–37)
Albumin: 4 g/dL (ref 3.5–5.2)
Alkaline Phosphatase: 112 U/L (ref 39–117)
BUN: 13 mg/dL (ref 6–23)
Chloride: 109 mEq/L (ref 96–112)
GFR calc Af Amer: >60 mL/min (ref >60)
Potassium: 3.1 mEq/L — ABNORMAL LOW (ref 3.5–5.1)
Sodium: 140 mEq/L (ref 135–145)
Total Bilirubin: 0.6 mg/dL (ref 0.3–1.2)
Total Protein: 7.3 g/dL (ref 6.0–8.3)

## 2010-09-06 LAB — POCT CARDIAC MARKERS
CKMB, poc: <1 ng/mL — ABNORMAL LOW (ref 1.0–8.0)
Myoglobin, poc: 28.6 ng/mL (ref 12–200)
Troponin i, poc: <0.05 ng/mL (ref 0.00–0.09)

## 2010-09-06 LAB — DIFFERENTIAL
Basophils Absolute: 0 10*3/uL (ref 0.0–0.1)
Basophils Relative: 0 % (ref 0–1)
Eosinophils Absolute: 0.1 10*3/uL (ref 0.0–0.7)
Eosinophils Relative: 1 % (ref 0–5)
Monocytes Absolute: 0.8 10*3/uL (ref 0.1–1.0)
Monocytes Relative: 5 % (ref 3–12)

## 2010-09-06 LAB — GLUCOSE, CAPILLARY

## 2010-09-06 LAB — URINE MICROSCOPIC-ADD ON

## 2010-09-06 LAB — BASIC METABOLIC PANEL
CO2: 25 mEq/L (ref 19–32)
GFR calc Af Amer: >60 mL/min (ref >60)
Glucose, Bld: 54 mg/dL — ABNORMAL LOW (ref 70–99)
Potassium: 3.2 mEq/L — ABNORMAL LOW (ref 3.5–5.1)
Sodium: 143 mEq/L (ref 135–145)

## 2010-09-07 LAB — COMPREHENSIVE METABOLIC PANEL
ALT: 10 U/L (ref 0–53)
AST: 30 U/L (ref 0–37)
Albumin: 4.4 g/dL (ref 3.5–5.2)
CO2: 23 mEq/L (ref 19–32)
Calcium: 9.6 mg/dL (ref 8.4–10.5)
Chloride: 106 mEq/L (ref 96–112)
Creatinine, Ser: 0.64 mg/dL (ref 0.4–1.5)
GFR calc Af Amer: >60 mL/min (ref >60)
GFR calc non Af Amer: >60 mL/min (ref >60)
Sodium: 138 mEq/L (ref 135–145)

## 2010-09-07 LAB — CBC
Hemoglobin: 15.6 g/dL (ref 13.0–17.0)
MCH: 31.6 pg (ref 26.0–34.0)
MCHC: 34.7 g/dL (ref 30.0–36.0)
Platelets: 238 10*3/uL (ref 150–400)
RBC: 4.94 MIL/uL (ref 4.22–5.81)

## 2010-09-07 LAB — DIFFERENTIAL
Eosinophils Absolute: 0 10*3/uL (ref 0.0–0.7)
Eosinophils Relative: 0 % (ref 0–5)
Lymphocytes Relative: 20 % (ref 12–46)
Lymphs Abs: 2.1 10*3/uL (ref 0.7–4.0)
Monocytes Absolute: 0.5 10*3/uL (ref 0.1–1.0)

## 2010-09-07 LAB — URINALYSIS, ROUTINE W REFLEX MICROSCOPIC
Bilirubin Urine: NEGATIVE
Glucose, UA: NEGATIVE mg/dL
Hgb urine dipstick: NEGATIVE
Ketones, ur: 15 mg/dL — AB
Nitrite: NEGATIVE
Specific Gravity, Urine: 1.021 (ref 1.005–1.030)
pH: 6.5 (ref 5.0–8.0)

## 2010-09-07 LAB — RAPID URINE DRUG SCREEN, HOSP PERFORMED
Amphetamines: NOT DETECTED
Benzodiazepines: NOT DETECTED
Cocaine: NOT DETECTED
Opiates: POSITIVE — AB
Tetrahydrocannabinol: NOT DETECTED

## 2010-09-07 LAB — ETHANOL: Alcohol, Ethyl (B): <5 mg/dL (ref 0–10)

## 2010-09-09 LAB — DIFFERENTIAL
Basophils Relative: 1 % (ref 0–1)
Eosinophils Relative: 1 % (ref 0–5)
Lymphocytes Relative: 36 % (ref 12–46)
Monocytes Absolute: 0.4 10*3/uL (ref 0.1–1.0)
Monocytes Relative: 5 % (ref 3–12)
Neutro Abs: 4.7 10*3/uL (ref 1.7–7.7)

## 2010-09-09 LAB — CBC
HCT: 43.9 % (ref 39.0–52.0)
Platelets: 302 10*3/uL (ref 150–400)
RDW: 15.2 % (ref 11.5–15.5)
WBC: 8.2 10*3/uL (ref 4.0–10.5)

## 2010-09-09 LAB — COMPREHENSIVE METABOLIC PANEL
AST: 18 U/L (ref 0–37)
Albumin: 3.8 g/dL (ref 3.5–5.2)
Alkaline Phosphatase: 108 U/L (ref 39–117)
BUN: 13 mg/dL (ref 6–23)
GFR calc Af Amer: >60 mL/min (ref >60)
Potassium: 3.7 mEq/L (ref 3.5–5.1)
Total Protein: 7.5 g/dL (ref 6.0–8.3)

## 2010-09-10 LAB — POCT I-STAT, CHEM 8
BUN: 12 mg/dL (ref 6–23)
Chloride: 113 mEq/L — ABNORMAL HIGH (ref 96–112)
Creatinine, Ser: 1 mg/dL (ref 0.4–1.5)
Sodium: 144 mEq/L (ref 135–145)

## 2010-09-10 LAB — HEPATIC FUNCTION PANEL
AST: 20 U/L (ref 0–37)
Albumin: 4.9 g/dL (ref 3.5–5.2)
Alkaline Phosphatase: 118 U/L — ABNORMAL HIGH (ref 39–117)
Total Protein: 8.6 g/dL — ABNORMAL HIGH (ref 6.0–8.3)

## 2010-09-10 LAB — DIFFERENTIAL
Basophils Relative: 0 % (ref 0–1)
Eosinophils Absolute: 0 10*3/uL (ref 0.0–0.7)
Lymphs Abs: 1.5 10*3/uL (ref 0.7–4.0)
Neutro Abs: 9.8 10*3/uL — ABNORMAL HIGH (ref 1.7–7.7)
Neutrophils Relative %: 86 % — ABNORMAL HIGH (ref 43–77)

## 2010-09-10 LAB — CBC
MCV: 92.7 fL (ref 78.0–100.0)
Platelets: 275 10*3/uL (ref 150–400)
RBC: 5.27 MIL/uL (ref 4.22–5.81)
WBC: 11.4 10*3/uL — ABNORMAL HIGH (ref 4.0–10.5)

## 2010-09-10 LAB — LIPASE, BLOOD: Lipase: 34 U/L (ref 11–59)

## 2010-09-24 LAB — DIFFERENTIAL
Basophils Absolute: 0.1 10*3/uL (ref 0.0–0.1)
Basophils Relative: 1 % (ref 0–1)
Monocytes Absolute: 0.3 10*3/uL (ref 0.1–1.0)
Neutro Abs: 6.4 10*3/uL (ref 1.7–7.7)
Neutrophils Relative %: 70 % (ref 43–77)

## 2010-09-24 LAB — COMPREHENSIVE METABOLIC PANEL
Albumin: 4 g/dL (ref 3.5–5.2)
Alkaline Phosphatase: 111 U/L (ref 39–117)
BUN: 6 mg/dL (ref 6–23)
CO2: 21 mEq/L (ref 19–32)
Chloride: 108 mEq/L (ref 96–112)
GFR calc non Af Amer: >60 mL/min (ref >60)
Glucose, Bld: 92 mg/dL (ref 70–99)
Potassium: 4.2 mEq/L (ref 3.5–5.1)
Total Bilirubin: 0.6 mg/dL (ref 0.3–1.2)

## 2010-09-24 LAB — CBC
HCT: 46.1 % (ref 39.0–52.0)
Hemoglobin: 15.8 g/dL (ref 13.0–17.0)
WBC: 9.1 10*3/uL (ref 4.0–10.5)

## 2010-09-24 LAB — ETHANOL: Alcohol, Ethyl (B): <5 mg/dL (ref 0–10)

## 2010-09-27 ENCOUNTER — Encounter: Payer: Self-pay | Admitting: Internal Medicine

## 2010-09-27 ENCOUNTER — Ambulatory Visit (INDEPENDENT_AMBULATORY_CARE_PROVIDER_SITE_OTHER): Payer: Self-pay | Admitting: Internal Medicine

## 2010-09-27 VITALS — BP 123/94 | HR 110 | Temp 97.1°F | Ht 68.0 in | Wt 126.9 lb

## 2010-09-27 DIAGNOSIS — L408 Other psoriasis: Secondary | ICD-10-CM

## 2010-09-27 DIAGNOSIS — K861 Other chronic pancreatitis: Secondary | ICD-10-CM

## 2010-09-27 LAB — URINALYSIS, ROUTINE W REFLEX MICROSCOPIC
Glucose, UA: NEGATIVE mg/dL
Hgb urine dipstick: NEGATIVE
Ketones, ur: 15 mg/dL — AB
pH: 5.5 (ref 5.0–8.0)

## 2010-09-27 LAB — DIFFERENTIAL
Eosinophils Absolute: 0.1 10*3/uL (ref 0.0–0.7)
Eosinophils Relative: 1 % (ref 0–5)
Lymphs Abs: 2.7 10*3/uL (ref 0.7–4.0)
Monocytes Absolute: 0.5 10*3/uL (ref 0.1–1.0)
Monocytes Relative: 5 % (ref 3–12)

## 2010-09-27 LAB — COMPREHENSIVE METABOLIC PANEL
ALT: 8 U/L (ref 0–53)
AST: 15 U/L (ref 0–37)
CO2: 21 mEq/L (ref 19–32)
Calcium: 9.1 mg/dL (ref 8.4–10.5)
GFR calc Af Amer: >60 mL/min (ref >60)
GFR calc non Af Amer: >60 mL/min (ref >60)
Sodium: 138 mEq/L (ref 135–145)
Total Protein: 7.3 g/dL (ref 6.0–8.3)

## 2010-09-27 LAB — CBC
MCHC: 34.1 g/dL (ref 30.0–36.0)
RBC: 4.55 MIL/uL (ref 4.22–5.81)

## 2010-09-27 MED ORDER — MORPHINE SULFATE CR 60 MG PO TB12: 60.0000 mg | ORAL_TABLET | Freq: Two times a day (BID) | ORAL | Status: DC

## 2010-09-27 MED ORDER — OXYCODONE-ACETAMINOPHEN 7.5-325 MG PO TABS: 1.0000 | ORAL_TABLET | ORAL | Status: DC | PRN

## 2010-09-27 NOTE — Assessment & Plan Note (Signed)
He continues to have extensive psoriatic skin lesions. This involves this feet and hands. Patient is using steroid cream to apply topically. Patient reports that there is some benefit to it. Overall patient is waiting for disability so that he can get medical coverage. Then he can refer to dermatologist and would be able to afford medications.

## 2010-09-27 NOTE — Patient Instructions (Signed)
Follow up as needed

## 2010-09-27 NOTE — Assessment & Plan Note (Signed)
Patient continues to have chronic of bowel pain secondary to chronic pancreatitis. He has some nausea. His pain medications are working for him at this time. He says his pain is 5/10. Continue the current regimen

## 2010-09-27 NOTE — Progress Notes (Signed)
  Subjective:    Patient ID: [name], male    DOB: 1960/08/23, 50 y.o.   MRN: [id number]  HPI 50 year old male with a history of chronic pancreatitis, abdominal pain, and extensive psoriatic skin lesions presence for follow. A. persistent abdominal pain is well-controlled. He continues to have some nausea but able to function at his baseline. His final visit his grandchildren this week. Request for refill of his narcotic pain medications.  He also brings in a letter from his lawyer that he is for her filing disability. It appears that the roommate next month and decide what further paperwork as needed for filing his disability claim. He continues to have psoriatic skin lesions.   Review of Systems  All other systems reviewed and are negative.       Objective:   Physical Exam BP 123/94  Pulse 110  Temp(Src) 97.1 F (36.2 C) (Oral)  Ht 5\' 8"  (1.727 m)  Wt 126 lb 14.4 oz (57.561 kg)  BMI 19.29 kg/m2  General Appearance:    Alert, cooperative, no distress, appears stated age, thing appearing  Head:    Normocephalic, without obvious abnormality, atraumatic  Eyes:    PERRL, conjunctiva/corneas clear, EOM's intact, fundi    benign, both eyes       Ears:    Normal TM's and external ear canals, both ears  Nose:   Nares normal, septum midline, mucosa normal, no drainage   or sinus tenderness  Throat:   Lips, mucosa, and tongue normal; teeth and gums normal  Neck:   Supple, symmetrical, trachea midline, no adenopathy;       thyroid:  No enlargement/tenderness/nodules; no carotid   bruit or JVD  Back:     Symmetric, no curvature, ROM normal, no CVA tenderness  Lungs:     Clear to auscultation bilaterally, respirations unlabored  Chest wall:    No tenderness or deformity  Heart:    Tachycardia with regular rhythm, S1 and S2 normal, no murmur, rub  or gallop  Abdomen:     Soft,  Mildly tender, bowel sounds active all four quadrants,    no masses, no organomegaly          Extremities:   Extremities normal except skin lesions, atraumatic, no cyanosis or edema  Pulses:   2+ and symmetric all extremities  Skin:   Extensive, dry, shiny, scales of psoriatic skin disease on arms, feet, chest and thigh.   Lymph nodes:   Cervical, supraclavicular, and axillary nodes normal  Neurologic:   CNII-XII intact. Normal strength, sensation and reflexes      throughout         Assessment & Plan:

## 2010-09-30 LAB — CBC
HCT: 36.2 % — ABNORMAL LOW (ref 39.0–52.0)
HCT: 38.3 % — ABNORMAL LOW (ref 39.0–52.0)
Hemoglobin: 12.5 g/dL — ABNORMAL LOW (ref 13.0–17.0)
Hemoglobin: 14.8 g/dL (ref 13.0–17.0)
MCHC: 34.5 g/dL (ref 30.0–36.0)
MCHC: 35.1 g/dL (ref 30.0–36.0)
MCV: 90.9 fL (ref 78.0–100.0)
MCV: 91.1 fL (ref 78.0–100.0)
Platelets: 236 10*3/uL (ref 150–400)
RBC: 3.98 MIL/uL — ABNORMAL LOW (ref 4.22–5.81)
RBC: 4.64 MIL/uL (ref 4.22–5.81)
RDW: 14.7 % (ref 11.5–15.5)
RDW: 14.9 % (ref 11.5–15.5)
WBC: 10.1 10*3/uL (ref 4.0–10.5)
WBC: 7.9 10*3/uL (ref 4.0–10.5)

## 2010-09-30 LAB — TESTOSTERONE: Testosterone: 280.14 ng/dL — ABNORMAL LOW (ref 350–890)

## 2010-09-30 LAB — DIFFERENTIAL
Basophils Absolute: 0.1 10*3/uL (ref 0.0–0.1)
Basophils Absolute: 0.1 10*3/uL (ref 0.0–0.1)
Basophils Relative: 1 % (ref 0–1)
Basophils Relative: 1 % (ref 0–1)
Eosinophils Absolute: 0.3 10*3/uL (ref 0.0–0.7)
Eosinophils Relative: 3 % (ref 0–5)
Lymphs Abs: 3.3 10*3/uL (ref 0.7–4.0)
Monocytes Absolute: 0.4 10*3/uL (ref 0.1–1.0)
Monocytes Relative: 5 % (ref 3–12)
Neutrophils Relative %: 58 % (ref 43–77)

## 2010-09-30 LAB — BASIC METABOLIC PANEL
BUN: 3 mg/dL — ABNORMAL LOW (ref 6–23)
CO2: 22 mEq/L (ref 19–32)
CO2: 27 mEq/L (ref 19–32)
Calcium: 8.9 mg/dL (ref 8.4–10.5)
Chloride: 109 mEq/L (ref 96–112)
Chloride: 109 mEq/L (ref 96–112)
Creatinine, Ser: 0.54 mg/dL (ref 0.4–1.5)
GFR calc Af Amer: >60 mL/min (ref >60)
GFR calc Af Amer: >60 mL/min (ref >60)
GFR calc non Af Amer: >60 mL/min (ref >60)
Glucose, Bld: 94 mg/dL (ref 70–99)
Glucose, Bld: 95 mg/dL (ref 70–99)
Potassium: 4.2 mEq/L (ref 3.5–5.1)
Potassium: 4.3 mEq/L (ref 3.5–5.1)
Sodium: 137 mEq/L (ref 135–145)
Sodium: 140 mEq/L (ref 135–145)

## 2010-09-30 LAB — COMPREHENSIVE METABOLIC PANEL
ALT: 9 U/L (ref 0–53)
CO2: 26 mEq/L (ref 19–32)
Calcium: 9.3 mg/dL (ref 8.4–10.5)
Chloride: 99 mEq/L (ref 96–112)
GFR calc non Af Amer: >60 mL/min (ref >60)
Glucose, Bld: 115 mg/dL — ABNORMAL HIGH (ref 70–99)
Sodium: 133 mEq/L — ABNORMAL LOW (ref 135–145)
Total Bilirubin: 0.3 mg/dL (ref 0.3–1.2)

## 2010-09-30 LAB — URINALYSIS, ROUTINE W REFLEX MICROSCOPIC
Bilirubin Urine: NEGATIVE
Hgb urine dipstick: NEGATIVE
Specific Gravity, Urine: 1.024 (ref 1.005–1.030)
pH: 5.5 (ref 5.0–8.0)

## 2010-09-30 LAB — RAPID URINE DRUG SCREEN, HOSP PERFORMED
Barbiturates: NOT DETECTED
Benzodiazepines: POSITIVE — AB

## 2010-09-30 LAB — TSH: TSH: 2.474 u[IU]/mL (ref 0.350–4.500)

## 2010-09-30 LAB — LIPASE, BLOOD: Lipase: 24 U/L (ref 11–59)

## 2010-09-30 LAB — FOLLICLE STIMULATING HORMONE: FSH: 4.8 m[IU]/mL (ref 1.4–18.1)

## 2010-09-30 LAB — CORTISOL: Cortisol, Plasma: 7.6 ug/dL

## 2010-09-30 LAB — MAGNESIUM: Magnesium: 1.9 mg/dL (ref 1.5–2.5)

## 2010-10-04 LAB — COMPREHENSIVE METABOLIC PANEL
ALT: <8 U/L (ref 0–53)
AST: 16 U/L (ref 0–37)
CO2: 20 mEq/L (ref 19–32)
Calcium: 9.4 mg/dL (ref 8.4–10.5)
Creatinine, Ser: 0.74 mg/dL (ref 0.4–1.5)
GFR calc Af Amer: >60 mL/min (ref >60)
GFR calc non Af Amer: >60 mL/min (ref >60)
Sodium: 142 mEq/L (ref 135–145)
Total Protein: 6.4 g/dL (ref 6.0–8.3)

## 2010-10-04 LAB — CBC
MCHC: 35 g/dL (ref 30.0–36.0)
MCV: 92.8 fL (ref 78.0–100.0)
Platelets: 222 10*3/uL (ref 150–400)
RBC: 4.32 MIL/uL (ref 4.22–5.81)
RDW: 14.4 % (ref 11.5–15.5)

## 2010-10-04 LAB — DIFFERENTIAL
Eosinophils Absolute: 0.3 10*3/uL (ref 0.0–0.7)
Eosinophils Relative: 3 % (ref 0–5)
Lymphocytes Relative: 24 % (ref 12–46)
Lymphs Abs: 2.6 10*3/uL (ref 0.7–4.0)
Monocytes Relative: 4 % (ref 3–12)
Neutrophils Relative %: 68 % (ref 43–77)

## 2010-10-04 LAB — LIPASE, BLOOD: Lipase: 21 U/L (ref 11–59)

## 2010-10-08 LAB — URINALYSIS, ROUTINE W REFLEX MICROSCOPIC
Bilirubin Urine: NEGATIVE
Glucose, UA: NEGATIVE mg/dL
Hgb urine dipstick: NEGATIVE
Hgb urine dipstick: NEGATIVE
Nitrite: NEGATIVE
Protein, ur: NEGATIVE mg/dL
Protein, ur: NEGATIVE mg/dL
Specific Gravity, Urine: 1.024 (ref 1.005–1.030)
Urobilinogen, UA: 0.2 mg/dL (ref 0.0–1.0)

## 2010-10-08 LAB — DIFFERENTIAL
Basophils Absolute: 0.4 10*3/uL — ABNORMAL HIGH (ref 0.0–0.1)
Basophils Absolute: 0.2 10*3/uL — ABNORMAL HIGH (ref 0.0–0.1)
Basophils Relative: 3 % — ABNORMAL HIGH (ref 0–1)
Eosinophils Absolute: 0.4 10*3/uL (ref 0.0–0.7)
Eosinophils Relative: 3 % (ref 0–5)
Lymphocytes Relative: 27 % (ref 12–46)
Neutro Abs: 8.2 10*3/uL — ABNORMAL HIGH (ref 1.7–7.7)
Neutrophils Relative %: 66 % (ref 43–77)

## 2010-10-08 LAB — CBC
HCT: 42.4 % (ref 39.0–52.0)
HCT: 35.3 % — ABNORMAL LOW (ref 39.0–52.0)
Hemoglobin: 14.1 g/dL (ref 13.0–17.0)
Hemoglobin: 11.9 g/dL — ABNORMAL LOW (ref 13.0–17.0)
Hemoglobin: 13 g/dL (ref 13.0–17.0)
MCHC: 34.1 g/dL (ref 30.0–36.0)
MCHC: 34.4 g/dL (ref 30.0–36.0)
MCV: 91.2 fL (ref 78.0–100.0)
MCV: 89.9 fL (ref 78.0–100.0)
MCV: 90.1 fL (ref 78.0–100.0)
MCV: 90.9 fL (ref 78.0–100.0)
MCV: 91.6 fL (ref 78.0–100.0)
Platelets: 253 10*3/uL (ref 150–400)
Platelets: 284 10*3/uL (ref 150–400)
Platelets: 289 10*3/uL (ref 150–400)
RBC: 3.86 MIL/uL — ABNORMAL LOW (ref 4.22–5.81)
RBC: 4.02 MIL/uL — ABNORMAL LOW (ref 4.22–5.81)
RBC: 4.17 MIL/uL — ABNORMAL LOW (ref 4.22–5.81)
RBC: 4.23 MIL/uL (ref 4.22–5.81)
RDW: 16.2 % — ABNORMAL HIGH (ref 11.5–15.5)
WBC: 12.5 10*3/uL — ABNORMAL HIGH (ref 4.0–10.5)
WBC: 10.3 10*3/uL (ref 4.0–10.5)
WBC: 10.6 10*3/uL — ABNORMAL HIGH (ref 4.0–10.5)
WBC: 12.8 10*3/uL — ABNORMAL HIGH (ref 4.0–10.5)
WBC: 8.8 10*3/uL (ref 4.0–10.5)

## 2010-10-08 LAB — COMPREHENSIVE METABOLIC PANEL
ALT: <8 U/L (ref 0–53)
AST: 15 U/L (ref 0–37)
Alkaline Phosphatase: 80 U/L (ref 39–117)
BUN: 11 mg/dL (ref 6–23)
CO2: 25 mEq/L (ref 19–32)
Chloride: 110 mEq/L (ref 96–112)
Chloride: 109 mEq/L (ref 96–112)
Creatinine, Ser: 0.73 mg/dL (ref 0.4–1.5)
Creatinine, Ser: 0.6 mg/dL (ref 0.4–1.5)
GFR calc Af Amer: >60 mL/min (ref >60)
GFR calc non Af Amer: >60 mL/min (ref >60)
GFR calc non Af Amer: >60 mL/min (ref >60)
Glucose, Bld: 101 mg/dL — ABNORMAL HIGH (ref 70–99)
Potassium: 3.5 mEq/L (ref 3.5–5.1)
Total Bilirubin: 0.3 mg/dL (ref 0.3–1.2)
Total Bilirubin: 0.1 mg/dL — ABNORMAL LOW (ref 0.3–1.2)

## 2010-10-08 LAB — BASIC METABOLIC PANEL
BUN: 3 mg/dL — ABNORMAL LOW (ref 6–23)
BUN: 4 mg/dL — ABNORMAL LOW (ref 6–23)
CO2: 24 mEq/L (ref 19–32)
CO2: 25 mEq/L (ref 19–32)
Calcium: 8.9 mg/dL (ref 8.4–10.5)
Chloride: 104 mEq/L (ref 96–112)
Chloride: 108 mEq/L (ref 96–112)
Chloride: 111 mEq/L (ref 96–112)
Creatinine, Ser: 0.6 mg/dL (ref 0.4–1.5)
Creatinine, Ser: 0.61 mg/dL (ref 0.4–1.5)
Creatinine, Ser: 0.65 mg/dL (ref 0.4–1.5)
Creatinine, Ser: 0.7 mg/dL (ref 0.4–1.5)
GFR calc Af Amer: >60 mL/min (ref >60)
GFR calc Af Amer: >60 mL/min (ref >60)
GFR calc Af Amer: >60 mL/min (ref >60)
GFR calc Af Amer: >60 mL/min (ref >60)
GFR calc non Af Amer: >60 mL/min (ref >60)
Glucose, Bld: 99 mg/dL (ref 70–99)
Potassium: 4 mEq/L (ref 3.5–5.1)
Sodium: 141 mEq/L (ref 135–145)

## 2010-10-08 LAB — RAPID URINE DRUG SCREEN, HOSP PERFORMED
Amphetamines: NOT DETECTED
Barbiturates: NOT DETECTED

## 2010-10-08 LAB — URINE CULTURE
Culture: NO GROWTH
Special Requests: NEGATIVE

## 2010-10-08 LAB — LIPID PANEL
HDL: 27 mg/dL — ABNORMAL LOW (ref >39)
LDL Cholesterol: 86 mg/dL (ref 0–99)
Triglycerides: 272 mg/dL — ABNORMAL HIGH (ref <150)
VLDL: 54 mg/dL — ABNORMAL HIGH (ref 0–40)

## 2010-10-08 LAB — CULTURE, RESPIRATORY W GRAM STAIN: Culture: NORMAL

## 2010-10-08 LAB — ACTH STIMULATION, 3 TIME POINTS: Cortisol, 60 Min: 14.3 ug/dL (ref >20)

## 2010-10-08 LAB — PROTIME-INR: INR: 1 (ref 0.00–1.49)

## 2010-10-08 LAB — LIPASE, BLOOD
Lipase: 27 U/L (ref 11–59)
Lipase: 28 U/L (ref 11–59)

## 2010-10-08 LAB — CULTURE, BLOOD (ROUTINE X 2)
Culture: NO GROWTH
Culture: NO GROWTH

## 2010-10-08 LAB — LACTATE DEHYDROGENASE: LDH: 145 U/L (ref 94–250)

## 2010-10-08 LAB — LACTIC ACID, PLASMA: Lactic Acid, Venous: 1.2 mmol/L (ref 0.5–2.2)

## 2010-10-08 LAB — LEGIONELLA ANTIGEN, URINE

## 2010-10-08 LAB — EXPECTORATED SPUTUM ASSESSMENT W GRAM STAIN, RFLX TO RESP C

## 2010-10-09 LAB — DIFFERENTIAL
Basophils Relative: 1 % (ref 0–1)
Lymphs Abs: 2.8 10*3/uL (ref 0.7–4.0)
Monocytes Relative: 3 % (ref 3–12)
Neutro Abs: 13.4 10*3/uL — ABNORMAL HIGH (ref 1.7–7.7)
Neutrophils Relative %: 78 % — ABNORMAL HIGH (ref 43–77)

## 2010-10-09 LAB — CBC
HCT: 42.6 % (ref 39.0–52.0)
MCV: 92.2 fL (ref 78.0–100.0)
RBC: 4.62 MIL/uL (ref 4.22–5.81)
WBC: 17.2 10*3/uL — ABNORMAL HIGH (ref 4.0–10.5)

## 2010-10-09 LAB — COMPREHENSIVE METABOLIC PANEL
AST: 33 U/L (ref 0–37)
Alkaline Phosphatase: 86 U/L (ref 39–117)
CO2: 19 mEq/L (ref 19–32)
Chloride: 111 mEq/L (ref 96–112)
Creatinine, Ser: 0.67 mg/dL (ref 0.4–1.5)
GFR calc Af Amer: >60 mL/min (ref >60)
GFR calc non Af Amer: >60 mL/min (ref >60)
Potassium: 5.2 mEq/L — ABNORMAL HIGH (ref 3.5–5.1)
Total Bilirubin: 1.3 mg/dL — ABNORMAL HIGH (ref 0.3–1.2)

## 2010-10-14 ENCOUNTER — Encounter: Payer: Self-pay | Admitting: Internal Medicine

## 2010-10-23 ENCOUNTER — Other Ambulatory Visit: Payer: Self-pay | Admitting: *Deleted

## 2010-10-23 DIAGNOSIS — K861 Other chronic pancreatitis: Secondary | ICD-10-CM

## 2010-10-25 ENCOUNTER — Other Ambulatory Visit: Payer: Self-pay | Admitting: *Deleted

## 2010-10-25 DIAGNOSIS — K861 Other chronic pancreatitis: Secondary | ICD-10-CM

## 2010-10-26 ENCOUNTER — Other Ambulatory Visit: Payer: Self-pay | Admitting: Internal Medicine

## 2010-10-26 ENCOUNTER — Telehealth: Payer: Self-pay | Admitting: *Deleted

## 2010-10-26 DIAGNOSIS — K861 Other chronic pancreatitis: Secondary | ICD-10-CM

## 2010-10-26 MED ORDER — MORPHINE SULFATE CR 60 MG PO TB12: 60.0000 mg | ORAL_TABLET | Freq: Two times a day (BID) | ORAL | Status: DC

## 2010-10-26 MED ORDER — OXYCODONE-ACETAMINOPHEN 7.5-325 MG PO TABS: 1.0000 | ORAL_TABLET | ORAL | Status: DC | PRN

## 2010-10-26 NOTE — Telephone Encounter (Signed)
Pt's mom here to pick up medications.  Said that pt's skin condition has worsened.  Skin is cracking all over his body.  Said that pt is in a lot of pain when he showers.  Skin is itching, red and peeling as well with burning..  Pt's mom said it looks like a fungus.  Offered an appointment said that she could only bring pt in next Friday.  Would like to get a Dermatology referral if possible.

## 2010-10-26 NOTE — Telephone Encounter (Signed)
He has bad psoriasis, so I doubt the lesions are fungal infection. He will need to be seen. As far as derm referral, I am happy to do it but he has no insurance, he is trying for disability. He will have to be seen in the clinic before making further decision on what he needs. As far as pain meds, again he needs to be evaluated.

## 2010-11-06 NOTE — Discharge Summary (Signed)
[name], CASSETTA NO.:  [id number]   MEDICAL RECORD NO.:  [id number]          PATIENT TYPE:  INP   LOCATION:  6715                         FACILITY:  [facility]   PHYSICIAN:  Alvester Morin, M.D.  DATE OF BIRTH:  05/05/1961   DATE OF ADMISSION:  04/29/2008  DATE OF DISCHARGE:  05/03/2008                               DISCHARGE SUMMARY   CONTINUITY DOCTOR:  Carlus Pavlov, M.D.   CONSULTANTS:  None.   DISCHARGE DIAGNOSIS:  Exacerbation of acute on chronic pancreatitis.   OTHER DIAGNOSES:  1. Chronic obstructive pulmonary disease by CT chest.  2. History of alcohol abuse.  3. Gastritis, duodenitis with a recent gastrointestinal endoscopy 2      months ago.  4. History of anxiety and panic disorder.  5. Psoriasis.  6. Degenerative disk disease.   DISCHARGE MEDICATIONS:  1. Percocet 7.5/325 mg 1-2 tablets p.o. q.4 h. as needed for pain.  2. MS Contin 30 mg p.o. q.12 h.  3. Omeprazole 40 mg p.o. twice daily.  4. Pravachol 40 mg p.o. daily.  5. Promethazine 12.5 q.4 h. as needed for nausea.  6. Pancrease 16-48-6, 5 tablets by mouth before each meals.  7. Multivitamins one tablet p.o. daily.   DISPOSITION AND FOLLOWUP:  [name] has an appointment at the  Outpatient Clinic on May 17, 2008, at 3 p.m.  He will follow up  with Dr. Elvera Lennox.  During that appointment, cortisone level need to be  repeated, ACTH level also.  If values are abnormal, consider  corticotropin stimulation test.  Also, pain controlled with opioids need  to be adjusted to avoid frequent admissions.   PROCEDURE PERFORMED:  None.   HISTORY OF PRESENT ILLNESS:  A 50 year old man with past medical history  significant for chronic pancreatitis with the last admission on  March 15, 2008.  He has history of pseudocyst that resolved CT  abdomen in 2007.  He has history of alcohol abuse.  Per records the  patient has been sober for 1 year.  History of gastritis, esophagitis,  and duodenitis.  He had endoscopy and colonoscopy done in 2009 who  presents complaining of abdominal pain.  He describes the abdominal pain  as sharp 10/10 in intensity radiating to his back.  No aggravating  factor.  He relates that the pain has started 1 day prior to admission.  The pain is similar to his pancreatitis pain.  He has not been eating  for the last 2 days.  He relates nausea and couple of episodes of  vomiting.  He denied blood in the stool.  He denies also melena, chest  pain.  He does relate mild shortness of breath for the last 2 weeks now  that is getting better.  He relates worsening cough for the last 2 weeks  that is getting better also.  He has chronic cough due to emphysema.  He  relates his usual diarrhea.   PHYSICAL EXAMINATION:  VITAL SIGNS:  Temperature 97.4, blood pressure  87/61, pulse 73, respirations 20, sat 100% on room air.  GENERAL:  The patient  was lying in bed in no acute distress.  EYES:  Pupils equal and reactive to light.  Extraocular muscles intact.  ENT:  Mucous membrane moist.  No tonsillar enlargement or erythema.  RESPIRATION:  Bilateral air movement.  No wheezing, no rales.  Mild fine  rhonchi.  CARDIOVASCULAR:  S1 and S2 normal, regular rate.  No murmur.  GASTROINTESTINAL:  Bowel sounds are positive, soft, no rigidity, no  guarding, and tenderness on the epigastric area.   LABORATORY DATA:  Lipase 27, mag 2.0, INR 1.0, PT 12.9, and sodium 137,  potassium 3.9, chloride 108, bicarb 22, BUN 14, creatinine 0.75, glucose  100, white blood cell 9.5, hemoglobin 13.2, hematocrit 39.5, platelets  255, ANC is 6.2, MCV 90.1, bilirubin 0.6, alkaline phosphatase 75, AST  19, ALT 9, protein 6.2, albumin 3.4, and calcium 9.1.   HOSPITAL COURSE:  1. Acute on chronic pancreatitis.  Mr. Colbie presents complaining of      abdominal pain.  Abdominal x-ray was ordered, which showed no free      air.  No sign of perforation.  We considered that his  abdominal      pain was an exacerbation of his acute pancreatitis.  He relate that      he ran out of his pain medication prior to hospitalization.  He was      started on IV fluid for pain control.  He was n.p.o.  It is not      clear the cause of his exacerbation.  He denies alcohol.  IgG was      ordered to rule out any autoimmune cause of chronic pancreatitis      and IgG was within normal limits.  He has had a prior abdominal      ultrasound that was negative for gallstone recently.  We also      started him on Protonix 40 mg IV b.i.d. due to his history of      gastritis and duodenitis.  These also could explain his abdominal      pain.  During hospitalization, his pain was controlled with IV      fluid and pain medications and he was feeling much better on the      day prior to discharge.  Pain was controlled.  2. Hypotension.  It was considered to be secondary to dehydration.  He      relates that he was not drinking or eating for the last 2 days.      The patient responds to IV fluids.  He received a total of 2 liter.      Also, adrenal insufficiency was considered in the differential      diagnosis for hypotension.  Cortisone level was drawn as it was at      the beginning.  The first cortisol was 2.5, it was really low.  So,      we decided to do the corticotropin stimulation test.  Repeat      cortisone level was 7.0 before the corticotropin stimulation test      and then after the corticotropin stimulation test, cortisone 30      minutes after was 18.1, cortisone 60 minutes after was 21.9.  We      were not able to conclude anything from these test results because      the ACTH level was not drawn before the stimulation test.  We gave      him a simulation dose of cortisone.  We will  need to repeat      cortisone level and ACTH level as an outpatient basis.  The      patient's blood pressure increased before he received a stress dose      of cortisone.  So his blood pressure  from 87/61 increased to 113/77      only with IV fluids.  His blood pressure remained in the range of      122/82, during his hospitalization.  3. Gastritis.  We will continue with his Protonix.  4. Alcohol abuse.  We have started him on CIWA protocol, thiamine, and      folate.   DISCHARGE LABORATORY AND VITALS:  On the day of discharge, Mr. Turton was  feeling at baseline.  His abdominal pain pretty much resolved.  Vitals:  Temperature 98.1, blood pressure 136/80, pulse 69, respirations 18, sat  96% on room air.  White blood cell 7.9, hemoglobin 12.1, platelets 235.  Sodium 140, potassium 3.1, chloride 109, bicarb 23, BUN 3, creatinine  0.59, glucose 156.  The patient received potassium replacement before  discharge.      Hartley Barefoot, MD  Electronically Signed      Alvester Morin, M.D.  Electronically Signed    BR/MEDQ  D:  06/03/2008  T:  06/03/2008  Job:  [id number]

## 2010-11-06 NOTE — Discharge Summary (Signed)
[name], [name] NO.:  [id number]   MEDICAL RECORD NO.:  [id number]          PATIENT TYPE:  INP   LOCATION:  5032                         FACILITY:  [facility]   PHYSICIAN:  Alvester Morin, M.D.  DATE OF BIRTH:  Apr 25, 1961   DATE OF ADMISSION:  12/26/2008  DATE OF DISCHARGE:  12/30/2008                               DISCHARGE SUMMARY   DISCHARGE DIAGNOSES:  1. 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  2. Question of adrenal insufficiency - diagnostic evaluation negative      during this admission.  3. Duodenitis and esophagitis per esophagogastroduodenoscopy on December 15, 2008.  4. Psoriasis.  5. Remote alcohol abuse.  6. Dyslipidemia.  7. Degenerative disk disease.  8. Anxiety.  9. Tobacco dependence.   DISCHARGE MEDICATIONS:  1. Percocet 7.5/325 one tablet p.o. q.4 h. p.r.n.  2. Phenergan 12.5 mg 1 tablet p.o. q.4 h. p.r.n.  3. Pancrease 4 tabs q.a.c.  4. Pravachol 40 mg p.o. daily.  5. Fish oil 3 tabs p.o. daily.  6. Omeprazole 40 mg p.o. b.i.d.  7. MS Contin 60 mg p.o. b.i.d.  8. Ativan 0.5 mg 1-2 tablets q.8 h. p.r.n.  9. Amitriptyline 50 mg p.o. at bedtime.  10.Nicotine patch 21 mg daily.   DISPOSITION/FOLLOWUP:  The patient is to follow up with Dr. Clerance Lav in the Outpatient Clinic on February 16, 2009 at 10:20 a.m.  At that  time, a BMET needs to be checked to evaluate his potassium.  The patient  also needs routine health maintenance and laboratory work done, such as  lipids, during that office visit.  His chronic abdominal pain also needs  to be evaluated.   PROCEDURES PERFORMED:  No procedures were performed.   CONSULTATIONS:  No consultations were obtained.   ADMITTING HISTORY AND PHYSICAL:  The patient is a 50 year old male with  past medical history of chronic pancreatitis with chronic calcifications  per MRCP in May 2010, remote alcohol abuse, and recent workup for  GI  bleeding who presented to the emergency room complaining of abdominal  pain.  Over the last 2-3 days prior to admission, it radiated to his  back and feels exactly like my pancreas pain.  It has been associated  with nausea and bilious vomiting along with diarrhea.  He denies  hematemesis, hematochezia, fevers, chills, dizziness, or other systemic  symptoms.  He does feel weak and is concerned he may be starting to get  dehydrated.  He denies any recent alcohol or illegal drug use.   PHYSICAL EXAMINATION:  VITAL SIGNS:  At admission, temperature 98.4,  blood pressure 129/97, heart rate 112, respiratory rate 18, O2 sat 95%  on room air.  GENERAL:  He is alert, oriented x3, in no acute distress.  HEENT:  Eyes are anicteric.  Pupils are equally round and reactive to  light.  Extraocular motions intact.  Mouth and pharynx is pink and  moist.  No erythema or exudates.  NECK:  Supple, no thyromegaly, no JVD, no lymphadenopathy.  LUNGS:  Clear to auscultation bilaterally with normal respiratory  effort.  HEART:  Borderline tachycardic.  Regular rhythm.  No murmurs, no rubs,  no gallops.  ABDOMEN:  Positive bowel sounds, soft, tender to palpation in the upper  abdomen, worse in the epigastrium without rebound or guarding.  No  ecchymoses noted around the flanks.  EXTREMITIES:  No clubbing, cyanosis, or edema.  Eczematous changes  noted.  NEURO:  Alert and oriented x3.  Cranial nerves II through XII intact.  Strength normal in all extremities.   ADMISSION LABORATORY DATA:  White blood cell count 10.1, hemoglobin  14.8, MCV 91.1, platelet count 301, sodium 133, potassium 3.4, chloride  99, bicarb 26, BUN 14, creatinine 0.70, glucose 115, total bilirubin  0.3, alkaline phosphatase 106, AST 18, ALT 9, total protein 7.6, albumin  3.8, calcium 9.3, lipase 24.  Urinalysis completely within normal  limits.  Urine drug screen positive for benzodiazepines and opiates.  Magnesium 1.9, TSH 2.474.   Random cortisol 7.6, FSH 4.8.   DIAGNOSTIC IMAGING:  Chest x-ray was obtained to evaluate PICC line  placement and notable for emphysema without acute findings.   HOSPITAL COURSE:  1. Acute-on-chronic pancreatitis.  The patient's pain and history was      most consistent with a flare of chronic pancreatitis.  He has known      pancreatic tail calcifications per MRCP in May 2010 and has had      multiple pseudocysts in the past.  The patient was treated      conservatively with aggressive IV fluid hydration, n.p.o., and a      morphine PCA pump.  He was continued on this regimen for greater      than 24 hours and after this time was subsequently converted to      p.o. pain medicines and started on clear liquids.  He tolerated      this without problem and was subsequently discharged.  At the time      of discharge, he was tolerating a full diet and was on his home      regimen of narcotics.  2. Question of adrenal insufficiency.  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  Repeat diagnostic workup for adrenal deficiency including a      random cortisol level along with other evidence of pituitary      insufficiency such as a TSH and FSH were unrevealing and so it was      felt like the patient did not likely have adrenal insufficiency and      so he was not started on steroids.  3. Duodenitis/esophagitis per EGD.  The patient was continued on a PPI      during this hospitalization.  4. Tobacco abuse.  The patient was provided a nicotine patch during      this hospitalization and counseled on cessation.  5. Anxiety.  The patient was started on intravenous benzo's p.r.n. and      subsequently converted to oral at his home dose once he was      tolerating p.o..   DISCHARGE VITAL SIGNS:  Temperature 98.1, blood pressure 98/66, heart  rate 70,  respiratory rate 18, and O2 sat 96% on room air.   DISCHARGE LABORATORY DATA:  White blood cell count  7.9, hemoglobin 13.4,  MCV 90.1, platelets 236.  Sodium 140, potassium 4.3, chloride 109,  bicarb 27, BUN 3, creatinine 0.54, glucose 95, and calcium 8.9.      Joaquin Courts, MD  Electronically Signed      Alvester Morin, M.D.  Electronically Signed    VW/MEDQ  D:  01/03/2009  T:  01/03/2009  Job:  [id number]   cc:   Clerance Lav, MD PhD  Barbette Hair. Arlyce Dice, MD,FACG

## 2010-11-06 NOTE — Discharge Summary (Signed)
NAMEJARREL, KNOKE NO.:  [id number]   MEDICAL RECORD NO.:  [id number]          PATIENT TYPE:  INP   LOCATION:  5126                         FACILITY:  [facility]   PHYSICIAN:  Alvester Morin, M.D.  DATE OF BIRTH:  [birth year]   DATE OF ADMISSION:  03/15/2008  DATE OF DISCHARGE:  03/21/2008                               DISCHARGE SUMMARY   ATTENDING PHYSICIAN:  Steele Berg Phifer, MD   DISCHARGE DIAGNOSES:  1. Acute-on-chronic pancreatitis.  2. Normocytic anemia.  3. Tobacco dependence.  4. Chronic obstructive pulmonary disease.  5. History of alcohol abuse.  6. Gastritis/esophagitis/duodenitis with recent gastrointestinal bleed      2 months ago status post esophagogastroduodenoscopy and      colonoscopy.  7. Peptic ulcer disease.  8. History of depression and generalized anxiety with panic disorder.  9. Dyslipidemia.  10.Psoriasis.  11.Degenerative disk disease.  12.Drug reaction: history of significant hypotension with morphine.   Discharge medications are as follows:  1. Percocet 7.5/325 one-two tablets p.o. q.4 p.r.n.  2. OxyContin 15 mg 1 tablet p.o. b.i.d.  3. Pancrease 4-5 tablets p.o. with each meal.  4. Fish oil 1 tablet p.o. daily.  5. Ambien 5 mg 1 tablet p.o. nightly p.r.n.  6. Phenergan 25 mg 1 pill p.r.n. for nausea.  7. Omeprazole 20 mg 1 pill p.o. b.i.d.  8. Pravachol 40 mg 1 tablet p.o. daily.  9. Aspirin 81 mg 1 tablet p.o. daily.   DISPOSITION AND FOLLOWUP:  The patient is discharged home with followup  the patient at [facility] Medicine Outpatient Clinic at [facility] with  Dr. Robb Matar on April 13, 2008, at 2 o'clock p.m. to follow up on his  abdominal pain.   Mr. Swigart is a 50 year old male with a longstanding history of chronic  pancreatitis secondary to alcohol abuse, also with history of  esophagitis, gastritis, and duodenitis with a recent GI bleed in July.  He presented to the emergency department complaining of worsening  abdominal pain over the course of 3 days that was radiating to his back  as well as associated diarrhea, nausea, and vomiting.  He stated that  the pain worsens after any p.o. intake and was no longer controlled with  his home medications of OxyContin and Percocet.  He denies using any  alcohol and states that he quit over a year ago.  He describes the pain  as being similar to his pancreatitis flares in the past.  He denied any  fevers, chills, dizziness, syncope, or any other associated signs and  symptoms.   Vital Signs on admission are as follows:  Temperature 98.9, blood  pressure 105/68, heart rate 99, respiratory rate 18, O2 saturation 98%  on room air   Laboratory studies obtained in the emergency room are as follows:  Lipase of 34, CBC with white blood count of 10.5 and CE of 7.3,  hemoglobin of 13.2, hematocrit 93.6, MCV 90.4, platelets 285.  Basic  metabolic panel:  Sodium 143, potassium 3.9, chloride 111, CO2 25, BUN  12, creatinine 0.65, glucose 110.  Liver function tests:  Bilirubin  0.5,  alk phos 74, AST 17, ALT 10, total protein 6.6, albumin 3.4, calcium  9.4.  Mr. Kroh was subsequently admitted to a regular bed at inpatient  status to Internal Medicine Teaching Team at [facility].  He was placed  n.p.o. with IV fluids.  He was started on morphine PCA for pain control  and started on the Zofran and Reglan for nausea.   HOSPITAL COURSE:  1. Acute-on-chronic pancreatitis.  Pt had an acute abdominal series      and abdominal ultrasound that was unrevealing.  The patient      continued to improve throughout the course of his hospital stay.      His pain was adequately controlled with a morphine PCA and he did      not develop any vomiting or diarrhea or worsening abdominal pain.      He was placed on Phenergan for persistent nausea, as this is the      medication he takes at home and that worked well to control his      symptoms.  He developed a transient leukocytosis  with a white count      of 14.7 but remained afebrile with stable vital signs and his white      count ultimately came down over the course of 24 hours.  He also      developed some hypokalemia likely secondary to his n.p.o. status      that was adequately treated with p.o. potassium reactive and was      stable at the time of discharge.  2. Normocytic anemia.  His hemoglobin and hematocrit remained mildly      decreased throughout the course of this hospital stay.  Folate,      ferritin, and B12 were all checked and found to be within normal      limits.  It was felt that his anemia could be secondary to his      recent GI bleed as well as history of chronic gastritis,      esophagitis, and duodenitis.  hemoglobin was stable during this      admission.  3. Tobacco dependence.  The patient was placed on the nicotine patch.      He tolerated this well but did not elect to continue it at the time      of discharge.  4. COPD.  This was considered a stable and chronic condition.  Mr.      Lagace was given albuterol nebs as well as Atrovent nebulizer      treatments but did not develop any shortness of breath or acute      COPD exacerbation.  Discussed the importance of smoking cessation.  5. History of alcohol abuse.  Mr. Cloe states that he quit      approximately a year ago and no longer consumes alcohol.  6. Esophagitis/gastritis/duodenitis with history of recent GI bleed.      Mr. Acebo remained stable without any acute symptoms of GI      bleeding.  He was given Protonix for his gastritis throughout the      course of admission and resumed his omeprazole and discharged home.      He has a followup appointment with his gastroenterologist, Dr.      Arlyce Dice next month.  7. Peptic ulcer disease.  This was a stable condition.  Mr. Routh will      follow up with his gastroenterologist in 1 month.  8. History of  depression/generalized anxiety disorder.  Mr. Parke      states he is currently  doing well while not on any treatment for      depression or anxiety.  His affect was within normal limits      throughout the course of his hospital stay.  9. Dyslipidemia.  Cholesterol panel was obtained with the following      results:  Cholesterol 148, triglycerides 285, HDL 19, LDL 72.  He      is on Pravachol 40 mg 1 tab daily.  This was held throughout the      course of the hospitalization, as it was not known that he was      taking any medication for his dyslipidemia.  He will follow up      regarding this in the outpatient setting.   Vital signs on discharge are as follows:  Temperature 97.8, blood  pressure 97/66, heart rate 84, respiratory rate 18, O2 saturation 96% on  room air.   Dictation by Nelda Bucks, MS IV for Joaquin Courts, MD.      Joaquin Courts, MD  Electronically Signed      Alvester Morin, M.D.  Electronically Signed    VW/MEDQ  D:  03/21/2008  T:  03/22/2008  Job:  [id number]   cc:   Barbette Hair. Arlyce Dice, MD,FACG

## 2010-11-06 NOTE — Discharge Summary (Signed)
NAMEBREYLIN, DOM NO.:  [id number]   MEDICAL RECORD NO.:  [id number]          PATIENT TYPE:  INP   LOCATION:  5531                         FACILITY:  [facility]   PHYSICIAN:  Manning Charity, MD     DATE OF BIRTH:  [birth year]   DATE OF ADMISSION:  08/03/2007  DATE OF DISCHARGE:  08/06/2007                               DISCHARGE SUMMARY   There were no consultants.   DISCHARGE DIAGNOSES:  1. Acute on chronic pancreatitis.  2. Pancreatic pseudocyst (discovered by CT in 2007 and resolved by CT      on July 26, 2006).  3. Chest pain during this admission with negative cardiac enzymes and      EKG.  4. Tobacco abuse.  5. Emphysema.  6. Psoriasis.  7. Ethanol abuse (quit 1-1/2 years ago).  8. Duodenitis by esophagogastroduodenoscopy on June 24, 2005.  9. Hypotension during this admission, likely secondary to pain      medication, resolved.  10.Right upper extremity weakness and numbness, likely secondary to      cervical spondylosis.   MEDICATIONS AT DISCHARGE:  1. Pepcid 40 mg p.o. nightly.  2. Percocet 7.5/500 p.o. q.4h. p.r.n. pain.  3. Phenergan 12.5 mg p.o. q.4h. p.r.n. nausea.  4. OxyContin 10 mg p.o. b.i.d. p.r.n. chest pain.  5. Aspirin 81 mg p.o. daily.  6. Pancrease 1 to 3 tablets p.o. before meals.   The patient will have an appointment with Dr. Carlus Pavlov in the  outpatient clinic on August 12, 2007, at 2:30 p.m.  At that time, Dr.  Elvera Lennox should start anticholesterol medication.   CONDITION AT DISCHARGE:  Much improved.   PROCEDURES:  Chest x-ray on admission showed severe chronic lung disease  and no identifiable acute findings.   CHIEF COMPLAINT:  Nausea, vomiting, abdominal pain.   HISTORY OF PRESENT ILLNESS:  This is a 50 year old white man with  history of chronic pancreatitis and pseudocyst formation (resolved on a  CT in 2008) with calcification presumed secondary to alcohol abuse and  multiple admissions for acute  exacerbation of his chronic pancreatitis  in the past.  He presented to [facility] ED with 1 week's history of  epigastric and right upper quadrant abdominal pain radiating to the back  in a band-like fashion, also had dry heaving for a week.  He has not  been able to keep any p.o. down.  He does not have regular medical care,  and actually, he ran out of his pain medicines 2 days prior to symptoms  starting.   ALLERGIES:  No known drug allergies.   PHYSICAL EXAMINATION:  VITAL SIGNS:  Temperature 96.9, blood pressure  154/98, pulse 87, respiratory rate 20, oxygen saturation 100% on room  air.  GENERAL:  He was alert and oriented x3, nauseous, in mild distress.  HEENT:  Eyes:  PERRLA, extraocular movements intact.  Clear  oropharyngeal membranes.  NECK:  Supple neck, no JVD.  RESPIRATORY:  Good air entry, no wheezes, rhonchi or rales.  CARDIOVASCULAR:  Very difficult to auscultate, no murmurs, rubs, or  gallops.  GI:  Tender  to palpation in epigastrium and right upper quadrant,  nondistended, plus voluntary guarding, negative Murphy's sign, no  rebound, hyperactive bowel sounds.  EXTREMITIES:  No edema.  SKIN:  Psoriasis, more pronounced in upper extremities.  LYMPHS:  No peripheral lymphadenopathy.  NEURO:  Grossly intact, nonfocal.  PSYCH:  Appropriate.   LABORATORIES:  Sodium 137, potassium 4, chloride 105, bicarb 24, BUN 12,  creatinine 0.8, glucose 101, anion gap 8, bilirubin 0.7, alkaline  phosphatase 64, AST 20, ALT 12, protein 7, albumin 3.7, calcium 9.4,  white blood count 10.9 with an ANC of 7.6, hemoglobin 14.9, platelets  227, MCV 97.1, lipase normal at 34.  UDS was positive for propoxyphene,  benzodiazepines and opiates, all expected (patient ran out of his  clonazepam several days before admission).  Urinalysis was negative.  Three sets of cardiac enzymes were negative.  Blood alcohol level was  less than 5, BNP less than 30, lactic acid 1.  Fasting lipid profile:   Cholesterol 154, triglycerides 363, HDL 30, LDL 51, DLDL 73.  Magnesium  was 2 the day post-admission.   ASSESSMENT/PLAN:  1. Acute on chronic pancreatitis.  The patient had a normal lipase on      admission.  However, this was considered to be an acute      exacerbation due to the presence of his chronic pancreatitis and      typical symptoms, abdominal pain radiating to the back.  He also      had negative Murphy's sign.  Of note, a right upper quadrant      ultrasound during the previous admission in February, 2008, was      normal.  We admitted the patient to regular floor.  We placed him      on nothing per mouth.  We placed him on morphine patient-controlled      analgesia pump for pain control.  We gave him Zofran and then      Phenergan for control of nausea.  We started him on Protonix      intravenously for possible peptic ulcer disease (patient has a      history of duodenitis).  We considered reordering a CT of the      abdomen if the pain would not improve during this hospitalization.      However, this was not the case.  The patient gradually improved,      and by the time of discharge, he was tolerating his food.  2. Hypotension.  The patient's blood pressure went as low as 88/52.      However, this was considered to be secondary effect of his morphine      treatment, and the blood pressure was easily restored with fluid      boluses (D5 normal saline or normal saline).  3. Chest pain/shortness of breath.  The patient complained of chest      pain, probably as radiation from his right upper quadrant abdominal      pain.  He also has a history of chronic obstructive pulmonary      disease/emphysema and gastroesophageal reflux disease.  He has a      family history of early coronary artery disease in father who died      in his 71s from a massive acute myocardial infarction.  He also is      a smoker.  So, we checked cardiac enzymes and EKG, and they were      negative.  A  lipid panel was  abnormal, and this should be addressed      as outpatient.  He also may need a cardiac evaluation as outpatient      due to his risk factors.  4. Right upper extremity weakness and numbness.  This is stable.  He      might need cervical spine x-ray to rule out spondylosis (as      outpatient).  5. Transient hypokalemia, likely secondary to nausea and vomiting,      resolved by the time of discharge.   Vital signs at discharge:  Temperature 98.1, blood pressure 115/74,  pulse 65 to 79, respiration rate 18 to 20, oxygen saturation 92% to 97%  on room air.  White blood count 7.2, hemoglobin 14, hematocrit 40.2,  sodium 138, potassium 4, chloride 103, bicarb 27, BUN 3, creatinine 0.7,  glucose 102, calcium 9.2.      Carlus Pavlov, M.D.  Electronically Signed      Manning Charity, MD  Electronically Signed    CG/MEDQ  D:  08/10/2007  T:  08/11/2007  Job:  [id number]

## 2010-11-06 NOTE — Discharge Summary (Signed)
[name], [name] NO.:  [id number]   MEDICAL RECORD NO.:  [id number]          PATIENT TYPE:  INP   LOCATION:  5125                         FACILITY:  [facility]   PHYSICIAN:  Samuel Dickens, MD     DATE OF BIRTH:  [birth year]   DATE OF ADMISSION:  07/16/2008  DATE OF DISCHARGE:  07/21/2008                               DISCHARGE SUMMARY   DISCHARGE DIAGNOSES:  1. Acute on chronic pancreatitis.  2. Adrenal insufficiency.  3. Psoriasis.  4. Gastritis.  5. History of anxiety and panic disorder.  6. Degenerative disk disease.  7. Alcohol abuse.   DISCHARGE MEDICATIONS:  1. Percocet 7.5/325 mg 1 tablet p.o. q.4 h. as needed for pain.  2. Promethazine 12.5 mg 1 tablet p.o. q.4 h. as needed for nausea.  3. Pravachol 40 mg 1 tablet p.o. daily.  4. Multivitamin 1 tablet p.o. daily.  5. Fish oil 3 tablets p.o. daily.  6. Omeprazole 40 mg 1 tablet p.o. b.i.d.  7. MS Contin 30 mg 1 tablet p.o. b.i.d.  8. Dexamethasone 0.5 mg 1 tablet po daily  9. Pancrease 48/16/48 five-six tablets by mouth with each meal.   CONDITION AT DISCHARGE:  Stable and medically ready to be discharged.   The patient will follow up at [facility] with Dr. Elvera Lennox  on August 05, 2008, at 2:30 p.m.   PROCEDURES:  1. CT of the abdomen with contrast.  Impression:  a.  Negative for acute abdominal process.  b.  Stable course of pancreatic head calcification consistent with  chronic pancreatitis.   1. CT of the pelvis with contrast.  Impression:  a.  Normal appendix.  b.  Arterial calcifications.   1. Chest x-ray.  Impression:  1. Stable chronic lung disease.  2. No acute cardiopulmonary abnormalities.   HISTORY OF PRESENT ILLNESS:  A 50 year old man with past medical history  of chronic pancreatitis, gastritis, adrenal insufficiency comes to the  ED complaining of abdominal pain.  Abdominal pain started the day before  admission, located in epigastric region and radiated  to the back, 10/10  in intensity, pressure like, progressively got worsened the morning of  admission.  Associated with nausea, 3 episodes of diarrhea, and no blood  in stool.  Three episodes of vomiting, no blood.  The patient stated  that he has had a cough with greenish sputum production since Tuesday  for which he is taking Mucinex.  Denies fever, chills, chest pain,  palpitations, wheezing, or dysuria.   PHYSICAL EXAMINATION:  VITAL SIGNS:  Temperature 96.9, blood pressure  107/72, pulse 91, respiratory rate 18, O2 sat 100 on room air.  GENERAL:  Distress secondary to abdominal pain.  EYES:  EOMI, PERRLA, anicteric.  ENT:  Dry mucous membranes, nonerythematous pharynx.  NECK:  No JVD, no carotid bruits.  RESPIRATORY:  Clear to auscultation bilaterally.  CARDIOVASCULAR:  Regular rate and rhythm.  No murmurs, rubs, or gallops.  GI:  Bowel sounds are positive.  Voluntary guarding, tender to palpation  in epigastric and right upper quadrant, no rebound.  EXTREMITIES:  No  edema.  Pulses +2 bilaterally.  MUSCULOSKELETAL:  Normal range of motion throughout.  NEUROLOGIC:  Alert and oriented x3.  Cranial nerves II through XII  intact.  A 5/5 strength throughout.  Normal light touch sensation.  Cerebellar intact.   ADMISSION LABORATORY:  Sodium 139, potassium 3.8, chloride 109, bicarb  25, BUN 10, creatinine 0.6, glucose 85, white blood cells 12.8,  hemoglobin 13, hematocrit 38.4, platelets 307.  Alcohol level less than  5.  Lipase 28.   HOSPITAL COURSE:  1. Acute on chronic pancreatitis.  Etiology most likely due to      cigarette smoking versus idiopathic.  No history of trauma.  No      signs of ductal obstruction on CT of the abdomen.  Alcohol level      less than 5.  The patient did not seem to have systemic disease.      The patient was placed n.p.o., given aggressive IV fluids and      appropriate pain control.  On 4th day of admission, the patient's      pain had decreased,  and the patient was willing to start liquid      diet, which he tolerated well.  Then the patient's diet was      advanced, which he tolerated well.  At day of discharge, the      patient states that his abdominal pain was at baseline, it was 4-5      in intensity.  The patient will be discharged on home pain regimen.      The patient will follow up at [facility].  At that      time, abdominal pain will be reassessed.  2. Adrenal insufficiency.  The patient was kept on Decadron throughout      the hospitalization.  ACTH stimulation test was done to      differentiate between primary versus secondary.  Results as stated      in system.   DISCHARGE VITALS:  Temperature 97.6, blood pressure 94/64, pulse 73,  respiratory rate 18, O2 sat 97 on room air.   DISCHARGE LABORATORIES:  Sodium 140, potassium 3.6, chloride 104, bicarb  26, BUN 5, creatinine 0.7, glucose 121.  White blood cells 8.8,  hemoglobin 12.8, hematocrit 38.2, platelets 253.      Danne Harbor, MD  Electronically Signed      Samuel Dickens, MD  Electronically Signed    RV/MEDQ  D:  09/05/2008  T:  09/06/2008  Job:  [id number]

## 2010-11-08 ENCOUNTER — Other Ambulatory Visit: Payer: Self-pay | Admitting: *Deleted

## 2010-11-08 NOTE — Telephone Encounter (Signed)
RTC to pt message left for pt to call the Clinics.

## 2010-11-09 MED ORDER — AMITRIPTYLINE HCL 25 MG PO TABS: 50.0000 mg | ORAL_TABLET | Freq: Every evening | ORAL | Status: DC | PRN

## 2010-11-09 NOTE — Discharge Summary (Signed)
[name], [name] NO.:  [id number]   MEDICAL RECORD NO.:  [id number]          PATIENT TYPE:  INP   LOCATION:  5504                         FACILITY:  [facility]   PHYSICIAN:  Lonia Blood, M.D.DATE OF BIRTH:  1960/08/08   DATE OF PROCEDURE:  DATE OF DISCHARGE:                      STAT - MUST CHANGE TO CORRECT WORK TYPE   Date of discharge is to be determined.   PRIMARY CARE PHYSICIAN:  Unassigned.   PAST MEDICAL HISTORY:  1.  Acute flare of chronic pancreatitis with multiple hospitalizations for      same.  2.  Pancreatic pseudocyst in the pancreatic head.  3.  Pancreatic nodule near the pancreatic tail.      1.  Unable to define with CT or MRI clearly.      2.  Gastrin level pending to evaluate for possible Zollinger-Ellison.  4.  Chronic anxiety.  5.  Psoriasis.  6.  Depression.  7.  Long-standing history of alcohol abuse/alcoholism.  8.  Peptic ulcer disease.   DISCHARGE MEDICATIONS:  To be determined at the time of discharge.   CONSULTATIONS:  None.   PROCEDURES:  1.  Ultrasound of the abdomen on December 16, 2005 - no acute abnormalities.      Inhomogeneous cystic and solid area in the region of head of the      pancreas as noted on prior CT scans. No biliary ductal dilatation. Mild      fatty infiltrate of the liver.  2.  CT of abdomen and pelvis on December 17, 2005 - interval enlargement cystic      lesion between the head of the pancreas and the duodenum which is most      likely a pancreatic pseudocyst. Measures 3.3 x 2.8 cm. No evidence for      an acute pancreatitis. Questionable small hypervascular lesion in the      pancreatic tail near the splenic hilum.  No pelvic findings.  3.  MRI of the abdomen on December 19, 2005 - cystic lesion between the      pancreatic head and duodenum is most likely a pancreatic pseudocyst.      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 Not      well seen on post-contrast images. Follow  up dedicated pancreatic CT in      4 months recommended.   HISTORY OF PRESENT ILLNESS:  For details concerning this patient's  presentation to the hospital, please see dictated history and physical from  December 16, 2005 (job #[id number]).   HOSPITAL COURSE:  [name] was admitted for severe crampy abdominal  pain. This pain was worse with eating but present constantly. This was felt  to be an acute exacerbation of his known chronic pancreatitis.  The patient  did freely admit that he continues to abuse alcohol on a daily basis. He was  advised of the direct connection between his alcohol abuse and his pain and  chronic pancreatitis.  He was advised of the multiple other deleterious  effects of ongoing alcohol abuse. He was advised that he should discontinue  alcohol abuse immediately. He was offered counseling and assistance in doing  so. Work-up began with an ultrasound of the abdomen. This revealed the  patient's known pancreatic pseudocyst. The question was raised of a region  in the tail of the pancreas.  CT scan was obtained but was not able to  provide further information regarding this region. As a result, MRI of the  abdomen was obtained.  This lesion is sufficiently small but unfortunately  it was not able to be further defined on MRI.  Recommendation was for  reevaluation in 3 to 4 months with a dedicated pancreatic CT scan.  This  small nodular area in this patient with his symptom complex raises the  concern of a possibility of a gastrinoma/Zollinger-Ellison syndrome. As a  result, a fasting gastrin level was obtained. At the time of this dictation  it is not yet available.  Remainder of hospitalization covered by this  dictation was dedicated to the titration of pain medications.  Pancreatic  enzymes replacement was added as was Elavil for a synergistic effect.   At the time of this dictation the patient is not yet able to tolerate a full  diet and continues to be IV  fluid dependent. We are further titrating his  medications.  At such time that he is able to tolerate dietary intake, he  will be deemed stable for discharge. We will attempt to set the patient up  at [facility]   This patient has been specifically counseled by this physician that he needs  to discontinue alcohol and tobacco use immediately. He has been educated on  the multiple deleterious effects of both of these substances.  Social work  has also assisted in educating the patient on resources available to  discontinue both of these vices.      Lonia Blood, M.D.  Electronically Signed     JTM/MEDQ  D:  12/22/2005  T:  12/22/2005  Job:  [id number]

## 2010-11-09 NOTE — Discharge Summary (Signed)
[name], [name] NO.:  [id number]   MEDICAL RECORD NO.:  [id number]          PATIENT TYPE:  INP   LOCATION:  5504                         FACILITY:  [facility]   PHYSICIAN:  Elliot Cousin, M.D.    DATE OF BIRTH:  03-13-1961   DATE OF ADMISSION:  12/16/2005  DATE OF DISCHARGE:  12/25/2005                                 DISCHARGE SUMMARY   ADDENDUM:  Please see the previous discharge summary dictated by Dr. Jetty Duhamel on [phone].   HOSPITAL COURSE:  1.  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  Over the past few days, the patient's abdominal pain subsided      but did not completely resolve. A KUB was ordered yesterday to evaluate      the persistence of the patient's abdominal discomfort.  The KUB revealed      constipation, mild ileus, and a mildly distended bladder.  The patient      was therefore treated with a series of laxatives with good results.      Following the laxative therapy, the patient's abdominal discomfort      resolved.  As previously stated by Dr. Sharon Seller, a gastrin level was      ordered.  It was within normal limits at 52 (13 to 115-normal range).      The patient will need to have a follow-up CT scan of the abdomen in      three to four months for reevaluation of the pancreatic pseudocyst and      nodule. He was advised to follow up with his new primary care physician,      Dr. Audria Nine at the [facility] to establish continuity of care.   1.  CONSTIPATION/MILD ILEUS.  As stated above, the KUB performed on      July3,2007 revealed a mild ileus and stool in the colon.  The patient      was started on laxative therapy with good results.  Although the KUB      revealed that the patient had a distended bladder, he had no difficulty      voiding.   1.  RIGHT PERIHILAR DENSITY (REPEAT CHEST X-RAY OR CT SCAN OF THE  CHEST      RECOMMENDED IN THREE MONTHS).  The patient developed a fever two days      ago.  Blood cultures were ordered as well as a chest x-ray and      urinalysis.  The blood cultures have been negative thus far.  The      urinalysis was completely negative.  The chest x-ray, however, did      reveal severe COPD and a right perihilar density.  Given this finding,      the patient was treated for pneumonia with Levaquin 500 mg IV daily.      Following the initiation of Levaquin, the patient's fever completely      resolved.  At the time  of hospital discharge, he was completely      afebrile.  The patient was advised to stop smoking.  A follow-up/repeat      chest x-ray and/or CT scan of the chest is recommended in three months.      This will be deferred to the patient's new primary care physician at      [facility].   DISCHARGE MEDICATIONS:  1.  Phenergan 25 mg every six hours as needed.  2.  Amitriptyline 25 mg q.h.s.  3.  Percocet 7.5 mg every four hours as needed.  4.  Pepcid 20 mg b.i.d.  5.  Pancrease/Creon 10, one to three tablets t.i.d. with meals.  6.  Levaquin 500 mg for an additional five more days.  7.  Milk of magnesia and/or Senokot-S as needed and as directed.   The case manager was consulted to assist the patient with medications.  The  patient received several days of all medications with exception of Percocet.  Given that the patient is unemployed and uninsured, MS Contin was  discontinued as he could not afford it.  The patient has been receiving help  with acquiring Percocet from his mother  and grandmother and, therefore, this medication was continued for pain  relief.  The patient was strongly encouraged and advised to discontinue  alcohol and tobacco.  An appointment was made for him to follow up with Dr.  Audria Nine at the [facility] on Friday,  July6,2007 at 9:15 a.m.Marland Kitchen      Elliot Cousin, M.D.  Electronically Signed     DF/MEDQ  D:   12/25/2005  T:  12/25/2005  Job:  [id number]   cc:   Maurice March, M.D.  Fax: [phone]

## 2010-11-09 NOTE — Consult Note (Signed)
[name], Samuel NO.:  [name]   MEDICAL RECORD NO.:  [id number]          PATIENT TYPE:  INP   LOCATION:  5729                         FACILITY:  [facility]   PHYSICIAN:  Rachael Fee, MD   DATE OF BIRTH:  [birth year]   DATE OF CONSULTATION:  DATE OF DISCHARGE:                                 CONSULTATION   CONSULTATIONS:  Consulted by the teaching service, Dr. Darrick Huntsman.   REASON FOR CONSULTATION:  Abdominal pain, history of chronic  pancreatitis.   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  Imaging done in 2007, showed calcifications  in the head of his pancreas.  He had a waxing and waning pseudocyst in  the region of the head of this pancreas.  First seen in January 2007.  Imaging this month shows this has resolved.  He was found to have a  small 9 mm tail of the pancreas density, approximately one year ago.  Imaging this admission showed this is unchanged.  He quit drinking  completely 6 months ago when he heard about these cysts in his pancreas  and has successfully abstained from drinking since then.  He has chronic  abdominal discomfort for which he takes Endocet 2 to 3, sometimes 4  times a day.  For the most part he can manage at home, but sometimes the  pain gets so intense that he requires hospitalization with IV medicine.  Nothing particular seems to bring on the pains and as I had said above,  he has not had anything to drink in over 6 months.  His weight has been  essentially stable.  He does have fairly loose stools regularly.  He has  tried pancreatic enzymes, but says when he takes this he gets too  constipated.   One year ago, he had an EGD by Dr. Stan Head who was covering the  teaching service.  This found a somewhat inflamed duodenum that was  somewhat stenotic, this was attributed to the nearby pancreatic  fluid  collection and pancreatitis.   Beginning 4 to 5 days ago, he started to have his usual flare of pain.  He tried to cut this out for a couple of days with oral pain medicines,  but eventually presented to the emergency room.   LABORATORY DATA:  Initial lab tests show a normal CBC and normal  complete metabolic profile, normal amylase and lipase.   IMAGING STUDIES:  Showed the head of the pancreas fluid collection had  resolved to two new calcifications in the head of his pancreas, a stable  tail of the pancreas lesion (stable over approximately one year).   Since admission he has been on IV fluids with bowel rest.  He is slowly  improving.  Today he took some clear Jello and says this went down okay.   REVIEW OF SYSTEMS:  Notable for stable weight.  No fevers or chills.  No  rashes or lesions on skin.  No shortness of breath.  No chest pains.  No  eye symptoms.  No dysuria.   PAST MEDICAL HISTORY:  1. Chronic calcific pancreatitis as above.  2. Borderline diabetes.  3. Tobacco abuse.  4. psoriasis.  5. Emphysema.   HOME MEDICATIONS:  1. Percocet.  2. Pepcid.  3. Pancrease, which he does not take very regularly.   ALLERGIES:  NO KNOWN DRUG ALLERGIES.   SOCIAL HISTORY:  Divorced on disability, unable to keep a job due to his  intermittent pain.  Pain associated admissions.  Quit drinking 6 months  ago.  Smokes cigarettes.   FAMILY HISTORY:  No pancreatic disease in the family.   PHYSICAL EXAMINATION:  VITAL SIGNS:  Temperature 98.7, pulse 86,  respirations 16, blood pressure 103/71, satting 97% on room air.  CONSTITUTIONAL:  Generally well-appearing, pleasant man.  NEUROLOGIC:  Alert and oriented x3.  EYES:  Extraocular movements are intact.  MOUTH:  Oropharynx moist, no lesions.  NECK:  Supple.  No lymphadenopathy.  CARDIOVASCULAR:  Heart regular rate and rhythm.  LUNGS:  Clear to auscultation bilaterally.  ABDOMEN:  Soft, mildly tender midepigastrium.   Nondistended.  Normal  bowel sounds.  No ascites.  EXTREMITIES:  No lower extremity edema.  SKIN:  No rash or lesions on visible extremities.   ASSESSMENT:  A 50 year old man with chronic calcific pancreatitis.   I think most important here is that he get on a regular chronic pain  regimen. He would be best suited to be referred to the pain clinic.  I  think until he gets situated there, perhaps starting a fentanyl patch or  getting him on longer-acting OxyContin twice a day is reasonable.  He is  unable to hold down a job because of his admissions for his intermittent  pain and really seems to be trying to do everything right.  He in fact  quit drinking 6 months ago and seems very committed to continuing that.  The pseudocyst has resolved in the head of his pancreas.  The tail of  the pancreas lesion has been stable for over a year and do not think  there is anymore investigating given no interval growth.  He did have  some edema in his duodenum, possibly associated with the pancreatitis in  the head of his pancreas.  If he is not able to get back on a normal  diet in the next couple of days, I think an EGD should be performed to  reevaluate this area.  Perhaps he has some more significant stenosis.  I  think for now, advancing his diet as tolerated and continuing him on  pain medicines, weaning him to a better chronic pain regimen is  reasonable.      Rachael Fee, MD  Electronically Signed     DPJ/MEDQ  D:  07/27/2006  T:  07/28/2006  Job:  [id number]

## 2010-11-09 NOTE — Consult Note (Signed)
NAMEMIHCAEL, LEDEE NO.:  [id number]   MEDICAL RECORD NO.:  [id number]          PATIENT TYPE:  INP   LOCATION:  5737                         FACILITY:  [facility]   PHYSICIAN:  Iva Boop, M.D. LHCDATE OF BIRTH:  1961/04/15   DATE OF CONSULTATION:  06/28/2005  DATE OF DISCHARGE:                                   CONSULTATION   REQUESTING PHYSICIAN:  Dr. Sharon Seller   REASON FOR CONSULTATION:  Abnormal duodenum on CT.   ASSESSMENT:  Chronic recurrent pancreatitis admitted with abdominal pain.  There is an abnormal area of the duodenum with soft tissue changes in the  second and third portion of the duodenum with a dilated stomach.  This may  represent edema from pancreatitis versus pseudo cyst impression as there is  a fluid collection adjacent to the head of the pancreas.  The radiologist  could not discern whether this is a choledochocyst or a pseudo cyst, but  most likely would be a pseudo cyst.  There is no intrahepatic biliary duct  dilation.  He also has calcification in the head of the pancreas compatible  with chronic pancreatitis.  I have reviewed this scan personally.   RECOMMENDATIONS AND PLAN:  1.  Proceed with an EGD to investigate the abnormal duodenum whether or not      there is a mass there.  Look for gastric outlet obstruction.  2.  May need endoscopic ultrasound versus MRI versus MRCP.  I do not think      an ERCP is indicated at this time.  3.  He needs to stop drinking.  He has been told that.  4.  He has been on pancreatic enzymes in the past and they have not really      helped.  5.  Given his chronic alcohol history we will check his LFTs.  He certainly      could have some alcoholic hepatitis as well.  The liver did appear      normal on the CT scan.  6.  He has had some rectal bleeding, apparently.  A rectal examination shows      brown hemoccult-negative stool today.  There are some changes of      psoriasis around the buttocks and  that may have caused the bright red      blood.  It is possible he has had some melena per history, though he had      brown stool today again.  His hemoglobin is 12 so it is unlikely he has      had any major GI bleed.  The EGD will help sort that out.   HISTORY:  This is a 50 year old white man that moved to [place] from  [place] recently to look for work.  He is chronic alcoholic with known  chronic recurrent pancreatitis and history of peptic ulcer disease in the  late 1990s.  He has been on Phenergan and Percocet chronically.  There is  chronic nausea, intermittent vomiting, mainly dry heaves.  He has been on a  binge lately, most recently in October prior  to that.  Over the past few  months he has lost from 144 to 136 pounds.  He says his symptoms really are  about the same but he came in with some problems with increasing pain and  the CT scan showed the changes as outlined above.  Describes some bright red  blood per rectum at times and a question of dark stools.   ALLERGIES:  None known.   MEDICATIONS:  1.  Phenergan p.r.n.  2.  Morphine p.r.n.  3.  Protonix 40 mg twice daily.  4.  Reglan p.r.n.   SOCIAL HISTORY:  He is unemployed.  Smokes one pack per day.  Divorced.  Alcoholic binge over New Year's.   FAMILY HISTORY:  Father had an MI at age 36.  Father had renal cell  carcinoma as well.  Mother had diabetes.   REVIEW OF SYSTEMS:  Itching of the feet and legs.  Skin problems with the  elbows with the psoriasis.  Has had a tattoo.  Weight loss as described  above.  Some upper abdominal pain as described above.  All other systems  appear negative at this time.   PAST MEDICAL HISTORY:  1.  Chronic pancreatitis with apparent history of pseudo cysts.  2.  Peptic Ulcer Disease  3.  Anxiety.  4.  Psoriasis versus eczema.   No surgery or trauma.   PHYSICAL EXAMINATION:  GENERAL:  Somewhat chronically ill-appearing man  looking a little bit older than stated age.   VITAL SIGNS:  Weight 136 pounds, blood pressure 111/73, temperature 97.4,  pulse 71, respirations 20.  HEENT:  Anicteric.  Mouth:  He has missing teeth.  Pharynx and oral mucosa  otherwise clear.  NECK:  Supple.  No mass.  CHEST:  Decreased breath sounds at the bases, but otherwise clear.  HEART:  S1, S2.  I hear no rubs, murmurs, or gallops.  ABDOMEN:  He has some moderate right upper quadrant and side tenderness  without organomegaly or mass.  RECTAL:  Brown hemoccult-negative stool without mass.  Prostate is normal.  SKIN:  Changes of psoriasis in the elbows and presacral area, i.e., he has  pustular excoriated lesions, mainly erythematous with crusts and scabs.  NEUROLOGIC:  He is alert and oriented x3.  No asterixis.  There are no  spider angiomata or signs of chronic liver disease in the skin.  He has some  tattoos.  He has some excoriations in the lower legs and his dorsal feet as  well.   LABORATORY DATA:  Hemoglobin 12.4, white count 6.6.  Normal LFTs.  BMET  normal.  Alcohol level less than 5.  Positive opiates on his tox screen,  though he does take Percocet.  Amylase and lipase are 69 and 24,  respectively.  Albumin 3.1.  Coags normal.   I appreciate the opportunity to care for this patient.      Iva Boop, M.D. [facility]  Electronically Signed     CEG/MEDQ  D:  06/28/2005  T:  06/28/2005  Job:  [id number]

## 2010-11-09 NOTE — Discharge Summary (Signed)
[name], POLICE NO.:  [id number]   MEDICAL RECORD NO.:  [id number]          [name] TYPE:  INP   LOCATION:  5729                         FACILITY:  [facility]   PHYSICIAN:  [name], D.O.  DATE OF BIRTH:  1961/03/06   DATE OF ADMISSION:  07/25/2006  DATE OF DISCHARGE:  08/01/2006                               DISCHARGE SUMMARY   DISCHARGE DIAGNOSES:  1. Abdominal pain and nausea likely secondary to an acute exacerbation      of chronic pancreatitis.  2. Pancreatic pseudocyst.  3. Stable pancreatic lesion.  4. Chest pain.  5. Emphysema.  6. Psoriasis.  7. Tobacco abuse.   DISCHARGE MEDICATIONS:  Include:  1. Aspirin 81 mg p.o. daily.  2. Pancrease 1-3 tablets p.o. q.a.c. as needed for diarrhea.  3. MS Contin 30 mg 1 tab p.o. q.12 h. as needed for pain.  4. Percocet 5/325 mg 1 tablet up to every four hours as needed for      breakthrough pain.  5. Dovonex cream applied daily to psoriatic rash.  6. Protonix 40 mg take one tab p.o. daily.   PROCEDURES:  While in house [name] underwent:  1. A CT of his chest on July 25, 2006 which revealed chronic lung      disease with emphysema and multiple large paraseptal blebs.  2. No evidence of superimposed right perihilar mass or infiltrate and      Samuel aeration in this area appears improved from radiographs done      December 24, 2005.  He also had an ultrasound of Samuel abdomen performed      July 25, 2006 which revealed mild dilatation of Samuel common duct      but was an otherwise negative exam.  He had a CT of Samuel abdomen      performed on February 2 which revealed interval resolution of Samuel      pseudocyst adjacent to Samuel pancreatic head on Samuel prior study.  3. No evidence for progression of a mass lesion in Samuel tail of Samuel      pancreas.  4. No new or acute interval findings.   CONSULTATIONS:  During this hospitalization a consult was obtained by  Dr. Rob [name] for refractory abdominal  pain.   ADMITTING HISTORY AND PHYSICAL:  [name] is a 50 year old white [name]  with a past medical history significant for chronic pancreatitis who  presents with worsening abdominal pain and inability to tolerate p.o.  for several days prior to admission.  He attributes this increased pain  to Samuel lack of pain medication as he ran out of his prescription two  days previous.  He has been unable to keep down any solid foods and has  also developed diarrhea.  For more detailed assessment, refer to Samuel  admitting H&P.   HOSPITAL COURSE:  1. Abdominal pain and nausea.  This was attributed to an acute      exacerbation of his chronic pancreatitis; however, during Samuel      initial workup an ultrasound of Samuel right upper quadrant was  obtained to evaluate for cholecystitis.  Also during this      hospitalization a CT of Samuel abdomen was obtained to look for any      other acute abdominal process; however, none was found.  His acute      pancreatitis was treated by maintaining [name] n.p.o. and      treating with appropriate analgesia.  This included a morphine PCA      pump which was eventually  transitioned to oral medication, MS      Contin.  During Samuel course of this hospitalization, he was      successfully transitioned to p.o. medications and his diet was      advanced as tolerated.  His nausea was treated initially      symptomatically with Phenergan and this also eventually resolved.      Of note his admission labs:  His CBC includes a white blood cell      count of 9.5, an H&H of 15.6 and 45.1, platelets of 199, MCV of      96.3.  His basic metabolic panel includes sodium of 137, a      potassium of 4.3, a chloride of 104, bicarb of 21, BUN of 11,      creatinine 0.8, glucose of 113, bilirubin of 0.7, alkaline      phosphatase of 87, AST of 24, ALT of 14, protein of 7, albumin of      4, calcium of 9.5, lipase of 28.  His UA was remarkable for 15 of      ketones.  His UDS  was remarkable for positive opiates.  He was      hemoccult negative.  His lipid profile revealed a total cholesterol      of 213, triglycerides of 353, HDL of 50 and an LDL of 92.  2. Chest pain.  During Samuel course of this [name]'s hospitalization,      he noted a midsternal pressure which was exacerbated by exertion      simply walking to Samuel nurses' desk.  He has not experienced a      similar type of chest pain before and this pain was relieved by      rest.  [name] has a significant family history with Samuel father      undergoing an MI at age 45, so [name] was worked up for an MI;      however, EKGs and three sets of cardiac enzymes were negative.      Thus, [name] was arranged followup for an outpatient stress      test as well as an outpatient cardiology appointment as there did      not appear to be any reason for an in house consultation.  During      Samuel remainder of his hospitalization, [name] experienced no      repeat episodes of this chest pain and was discharged pain free.  3. Psoriasis.  This skin condition was stable throughout his      hospitalization.  [name] was discharged on his home      medications.  4. Pancreatic pseudocyst.  This was shown to be resolved by both      ultrasound and CT of his abdomen.  5. Pancreatic lesion, approximately 9 mm in size in Samuel tail of Samuel      pancreas.  This was unchanged from previous CT scans and was      determined  to be likely benign in nature.  6. Emphysema.  [name]'s CT of his chest reveals multiple bullae      consistent with emphysema.  [name] has not yet received a      diagnosis of emphysema.  This needs to be followed up by his      outpatient physician, and he likely needs to undergo PFTs to      definitively diagnose COPD as well as starting on any necessary      medications.  7. Tobacco abuse.  [name] was counseled on Samuel importance of     smoking cessation.  8. Lung nodule.   There is previous mention of a solitary lung nodule      on a chest x-ray.  However, Samuel CT of Samuel chest revealed complete      resolution of this finding.   On discharge [name]'s labs included a white blood cell count of  7.7, an H&H of 14.5 and 40.7, platelet count of 212, sodium of 140,  potassium of 3.9, chloride of 107, a bicarb of 28, a BUN of 2,  creatinine of 0.8, glucose of 105, calcium of 9.1.  [name] was  instructed to follow up with Dr. Dow Adolph on February 11 at  10:30, and [facility] was to call him to set up an outpatient  stress test.     ______________________________  Gypsy Decant    ______________________________  [name], D.O.    CS/MEDQ  D:  08/02/2006  T:  08/03/2006  Job:  [id number]   cc:   Maurice March, M.D.  [facility]

## 2010-11-09 NOTE — H&P (Signed)
NAMEHELAMAN, MECCA NO.:  [id number]   MEDICAL RECORD NO.:  [id number]          PATIENT TYPE:  INP   LOCATION:  1825                         FACILITY:  [facility]   PHYSICIAN:  Hillery Aldo, M.D.   DATE OF BIRTH:  23-Mar-1961   DATE OF ADMISSION:  12/16/2005  DATE OF DISCHARGE:                                HISTORY & PHYSICAL   PRIMARY CARE PHYSICIAN:  The patient is unassigned.   CHIEF COMPLAINT:  Pain and nausea.   HISTORY OF PRESENT ILLNESS:  The patient is a 50 year old male with a past  medical history of chronic pancreatitis who originally presented to the  emergency department last p.m. with complaints of abdominal pain accompanied  by nausea and vomiting.  He was discharged from the emergency department,  and, despite being sent out with antiemetics and pain medications, he  returns with ongoing problems with abdominal pain and nausea.  He states he  is no longer vomiting because he has not been able to eat anything.  He  denies any fever or chills.  He denies any melena or hematochezia.   PAST MEDICAL HISTORY:  1.  Chronic pancreatitis with multiple hospitalizations for acute      exacerbations.  2.  Pancreatic pseudocyst formation.  3.  Chronic anxiety.  4.  Psoriasis.  5.  Depression.  6.  Alcohol abuse with history of delirium tremens.  7.  Polysubstance abuse.  8.  History of peptic ulcer disease.   FAMILY HISTORY:  The patient's father died at age 69 from cancer of the lung  that metastasized to the liver.  He also had an MI at age 34.  The patient's  mother is alive at age 52 and has thyroid disease.  He has several siblings,  and he reports they are all healthy.  He has 1 son who is also healthy.   SOCIAL HISTORY:  The patient is divorced and lives in [place] with his  mother.  He has a long-standing history of polysubstance abuse.  He  currently admits that his last alcohol use was last week when he had 6  beers.  He is currently a  sporadic social drinker, but reports that he has  considerably cut down on his alcohol intake.  He used to drink a 12-pack of  beer on a fairly regular basis, and now he only drinks socially and in less  quantity.  He currently smokes a pack of cigarettes daily.  He has had a 1-2  pack history of smoking.   DRUG ALLERGIES:  None.   CURRENT MEDICATIONS:  None, although he has been prescribed Percocet and  Phenergan in the past, he has not taken these secondary not having any  prescription drug coverage.   REVIEW OF SYSTEMS:  The patient admits to weight loss secondary to decreased  appetite.  Denies any chest pain.  He has occasional shortness of breath and  cough, which he attributes to his smoking.  No dysuria, no musculoskeletal  components.   PHYSICAL EXAMINATION:  VITAL SIGNS:  Temperature of 97.6, pulse 85,  respirations 20, blood  pressure 104/67, O2 saturations 99% on room air.  GENERAL:  Well-developed, well-nourished male in no acute distress.  HEENT:  Normocephalic and atraumatic.  Pupils equal, round and reactive to  light.  Extraocular movements intact.  Oropharynx is clear.  NECK:  Supple.  No thyromegaly.  No lymphadenopathy.  No jugular venous  distention.  CHEST:  Decreased breath sounds but clear.  HEART:  Regular rate and rhythm.  No murmurs, rubs or gallops.  ABDOMEN:  Diffusely tender, especially in the epigastric area.  He has  hyperactive bowel sounds.  No masses.  EXTREMITIES:  No clubbing, edema or cyanosis.  SKIN:  Warm and dry.  No rashes.  NEUROLOGIC:  The patient is alert and oriented x3.  Nonfocal.   DATA REVIEWED:  Laboratory data revealed alcohol level of less than 5.  Urine drug screen positive for opiates.  Lipase is 40.  White blood cell  count is 9.8, hemoglobin 13.7, hematocrit 39.9, platelet count 214 with an  MCV of 99.1 and absolute neutrophil count of 6.5.  Sodium of 137, potassium  3.8, chloride 107, bicarbonate 26, BUN 14, creatinine 0.7,  glucose 115,  calcium 8.8, total protein 6, albumin 3.7.  Total bilirubin of 0.6.  AST 19,  ALT 12, alkaline phosphatase 53.   ASSESSMENT AND PLAN:  1.  Acute exacerbation of chronic pancreatitis.  We will admit the patient      and attempt conservative therapy.  We will keep the patient NPO and      administer IV fluids, pain medications, and antiemetics.  We will      continue to monitor his clinical course, and if indicated pursue a GI      consultation.  2.  Alcohol abuse.  We will watch the patient closely for signs of alcohol      withdrawal.  We will supplement him with thiamine and folic acid.  We      will obtain a care management consult for consideration of substance      abuse counseling.  3.  Tobacco abuse.  Will initiate tobacco cessation counseling.  4.  Prophylaxis.  Will initial deep vein thrombosis and gastrointestinal      prophylaxis with PAS hose and Protonix.      Hillery Aldo, M.D.  Electronically Signed     CR/MEDQ  D:  12/17/2005  T:  12/17/2005  Job:  [id number]

## 2010-11-09 NOTE — Discharge Summary (Signed)
NAMEYU, PEGGS NO.:  [id number]   MEDICAL RECORD NO.:  [id number]          PATIENT TYPE:  INP   LOCATION:  5737                         FACILITY:  [facility]   PHYSICIAN:  Michaelyn Barter, M.D. DATE OF BIRTH:  01-Dec-1960   DATE OF ADMISSION:  06/27/2005  DATE OF DISCHARGE:  07/02/2005                                 DISCHARGE SUMMARY   PRIMARY CARE PHYSICIAN:  Unassigned.   FINAL DIAGNOSES AT THE TIME OF DISCHARGE:  1.  Acute on chronic pancreatitis.  2.  History of alcoholism.  3.  Pancreatic pseudocyst.  4.  Duodenal edema seen on esophagogastroduodenoscopy.   CONSULTATIONS:  Gastroenterology with Dr. Iva Boop.   PROCEDURE:  1.  EGD completed on June 28, 2005, by Dr. Leone Payor.  2.  CT of the abdomen and pelvis completed on June 27, 2005.   HISTORY OF PRESENT ILLNESS:  Mr. Riebel is a 50 year old gentleman who  arrived with a chief complaint of abdominal pain, accompanied by nausea and  vomiting over the 24 hours leading up to this admission.  He went on to  state that he drank heavily over [place] Year[place] Day and developed pain right  after he began to drink.   PAST MEDICAL HISTORY:  Please see that dictated by Dr. Danae Chen  on June 28, 2005.   HOSPITAL COURSE:  Problem #1:  Acute on chronic pancreatitis. Following the  patient's presentation he had a CAT scan completed of his abdomen which was  done on June 27, 2005, and revealed:  (1) Soft tissue lesion along the  second and third portions of the duodenum, appearances worrisome for mass.  His stomach was also noted to be markedly distended.  There was also an  abnormal-appearing fluid collection on the head of the pancreas which may  have represented a choledochocyst; a pseudocyst  could also not be excluded;  and there was also a calcification noted at the head compatible with chronic  pancreatitis. There was no evidence of acute pancreatitis on that CT scan of  the abdomen.  The patient was admitted for pain control.  On June 28, 2005,  he underwent an EGD performed by Dr. Leone Payor.  His final assessment was that  the proximal esophagus, and distal esophagus revealed an irregular Z-line at  36 cm. Otherwise, the region extending from the fundus to the antrum  appeared to be normal. There was mucosal abnormality observed at the  duodenal bulb to the duodenal apex. Edema was present. Edema and stenosis  were seen with a final impression that there was stenotic and edematous  duodenum likely from pancreatitis and probable pseudocyst  effects. The  patient was started on PTIs empirically the following day. GI note indicated  that the patient suffered from chronic calcific pancreatitis with a  questionable pseudocyst versus choledochocyst. The patient had been started  on IV morphine on a p.r.n. basis and he began to consume a full liquid diet.  It was noted that he required continuous p.r.n. dosages of his morphine  sulfate.  He was started on a morphine PCA pump  on June 30, 2005,  secondary to his constant request for pain medication. By January 9th, GI  discontinued the patient's PCA pump, starting him on p.r.n. Tylox. He was  provided with a Duragesic patch and his diet was recommended to be advanced.  They went on to state that the patient would need a repeat CT scan in eight  weeks and recommended no additional workup while the patient was in the  hospital. Later in the day, July 02, 2005, the patient signed himself out  of the hospital AMA.   Problem #2:  History of alcoholism.  The patient was monitored very closely  over the course of his hospitalization signs of alcohol withdrawal. He did  not show any overt signs of alcohol withdrawal throughout the course of his  hospitalization. He was counselled regarding ceasing his alcohol  consumption.   Problem #3:  Anemia. The patient's hemoglobin remained stable.      Michaelyn Barter, M.D.   Electronically Signed     OR/MEDQ  D:  08/26/2005  T:  08/27/2005  Job:  [id number]

## 2010-11-09 NOTE — H&P (Signed)
[name], FRASIER NO.:  [id number]   MEDICAL RECORD NO.:  [id number]          PATIENT TYPE:  EMS   LOCATION:  MAJO                         FACILITY:  [facility]   PHYSICIAN:  Danae Chen, M.D.DATE OF BIRTH:  1961-05-11   DATE OF ADMISSION:  06/27/2005  DATE OF DISCHARGE:                                HISTORY & PHYSICAL   CHIEF COMPLAINT:  Abdominal pain.   HISTORY OF PRESENT ILLNESS:  The patient is a 50 year old white gentleman  with a history of recurrent acute on chronic pancreatitis, who presents with  complaints of abdominal pain, nausea, vomiting of 24 hours' duration.  He  has recently moved to the area from [place] and is not established with a  doctor here.  He reports that in 2003, he had his first episode of  pancreatitis.  This was related to heavy alcohol use that he admits to.  He  said that he has cut back on his drinking, but did drink over the [place]  and developed the pain right after that.  He has been told that he has some  gallbladder sludge, but never has had any evidence of gallstones.  No  medications that he has been on that he can recall that could have caused  pancreatitis.  No trauma to the area as well.   PAST MEDICAL HISTORY:  He has no significant past medical history other than  his general anxiety, panic attacks and the recurring chronic pancreatitis.   PAST SURGICAL HISTORY:  None.   FAMILY HISTORY:  Cancer only.   SOCIAL HISTORY:  He does continue to smoke one to two packs per day.  He is  currently unemployed.  He has one son.  He is divorced.  He denies any  alcohol use, but as noted, did drink over the holidays.   REVIEW OF SYSTEMS:  Reports that he has had some hematemesis in the past and  has had some dark-colored stool recently with some bright red blood as well.  He has had weight loss as well and positive for nausea and vomiting as  noted.   MEDICATIONS:  1.  Hydromorphone p.r.n.  2.  Reflux  medications as needed.   ALLERGIES:  No known drug allergies.   PHYSICAL EXAMINATION:  GENERAL:  He is alert and oriented.  Afebrile with  temperature 98.1 degrees, blood pressure 112/74, pulse 68, O2 saturation 98%  on room air.  HEENT:  Oropharynx clear.  Pupils equal and reactive.  Head is normocephalic  and atraumatic.  NECK:  Supple, no lymphadenopathy.  HEART:  Heart rate is regular.  Normal S1 and S2.  LUNGS:  Lung fields are clear to auscultation.  ABDOMEN:  He has diffuse abdominal tenderness, mostly around the mid-  epigastric area.  He has decreased bowel sounds.  No rebound, no guarding.  He has some slight peri-umbilical distention.  EXTREMITIES:  With 2+ femoral pulses, 2+ dorsalis pedis pulses.  No  peripheral edema.  NEUROLOGIC:  Nonfocal exam.   LABORATORY DATA:  INR 0.9.  White count 9900, hemoglobin 14.1, platelets  209.  Sodium 138, potassium 3.8, BUN 11, creatinine 0.7.  AST 19, ALT 11,  alkaline phosphatase 53, amylase 69, lipase 24.  Urine drug screen and  alcohol level are pending.   A CT of the abdomen shows a duodenal mass lesion in the upper two-thirds  part of the duodenum, read as a choledocho-cyst versus pancreatic pseudo-  cyst.  Followup with EGD is recommended.   IMPRESSION:  A 50 year old with a history of recurrent acute on chronic  pancreatitis with now CT evidence of possible pseudo-cyst versus choledocho-  cyst.   PLAN:  We will admit the patient for IV hydration and pain control.  It  appears that his pancreatic enzyme levels are not elevated, but this could  be related to the fact that his pancreas is burnt out, and that is why it  is not elevated.  It is most likely that he has had longstanding heavy  alcohol use which has caused this, but he certainly could have concomitant  gallstones and sludge which are exacerbating the problem.  In relation to  his hematemesis, he may have some evidence of gastritis as well.  At this  point will  recommend a GI consultation in the morning for an EGD, possible  ultrasound and possible ultrasound-guided paracentesis and/or ultrasound-  guided biopsy of the pancreas pseudo-cyst for possible drainage.      Danae Chen, M.D.  Electronically Signed     RLK/MEDQ  D:  06/28/2005  T:  06/28/2005  Job:  [id number]

## 2010-11-15 ENCOUNTER — Encounter: Payer: Self-pay | Admitting: Internal Medicine

## 2010-11-15 ENCOUNTER — Ambulatory Visit (INDEPENDENT_AMBULATORY_CARE_PROVIDER_SITE_OTHER): Payer: Self-pay | Admitting: Internal Medicine

## 2010-11-15 VITALS — BP 118/79 | HR 99 | Temp 96.0°F | Ht 68.0 in | Wt 133.0 lb

## 2010-11-15 DIAGNOSIS — L408 Other psoriasis: Secondary | ICD-10-CM

## 2010-11-15 DIAGNOSIS — L409 Psoriasis, unspecified: Secondary | ICD-10-CM

## 2010-11-15 DIAGNOSIS — K861 Other chronic pancreatitis: Secondary | ICD-10-CM

## 2010-11-15 NOTE — Assessment & Plan Note (Signed)
Pain is still the same about 7/10. Continues to take MS Contin and Percocet.

## 2010-11-15 NOTE — Assessment & Plan Note (Signed)
Lesions getting worse and so as planned before by Dr. Sherryll Burger, we'll refer to dermatology. He has [employer] and is waiting for his disability approval. At present he is using steroid ointment.

## 2010-11-15 NOTE — Patient Instructions (Signed)
Please make a f/u appointment in 2-3 months. Please make to the appointment with Dermatology for your Psoriasis. Please continue taking all your medications regularly.

## 2010-11-15 NOTE — Progress Notes (Signed)
  Subjective:    Patient ID: [name], male    DOB: Jun 04, 1961, 50 y.o.   MRN: [id number]  HPI [name] is a pleasant 50 year old man with a past with a history of chronic pancreatitis, psoriasis who comes to the clinic for dermatology referral for his extensive psoriasis. He is having psoriatic skin lesions for about 4 years now. He started with his elbows and knee and then spread to his forearm, eyes, back, legs. He itches constantly and is also painful when he walks due stretching of the chronic dry lesions. He says that taking shower is getting painful lately and so he wants to get evaluated and treated by dermatologist. He was considered for dermatologist her for but due to lack of insurance and financial issue it was deferred. At present he has [employer] card and is waiting for his disability to come throug. He also complaints of cough and phlegm production along with that which is going on for long time and he attributes that to his smoking, which he says is not able to quit. Denies any fever, chills, recent weight loss, night sweats, chest pain, headache, vision changes, muscle weakness, sensation changes. He gets short of breath while walking up in the mailbox very frequently now, and is not diagnosed of COPD.    Review of Systems    As per history of present illness  Objective:   Physical Exam    Constitutional: Vital signs reviewed.  Patient is a well-developed and well-nourished in no acute distress and cooperative with exam. Alert and oriented x3.  Head: Normocephalic and atraumatic Mouth: no erythema or exudates, MMM Eyes: PERRL, EOMI, conjunctivae normal, No scleral icterus.  Neck: Supple, Trachea midline normal ROM, No JVD Cardiovascular: RRR, S1 normal, S2 normal, no MRG, pulses symmetric and intact bilaterally Pulmonary/Chest: CTAB, no wheezes, rales, or rhonchi Abdominal: Soft. Mildly tender to palpation in epigastric region. non-distended, bowel sounds are normal, no  masses, organomegaly, or guarding present.  Musculoskeletal: No joint deformities, erythema, or stiffness, ROM full and no nontender Hematology: no cervical, inginal, or axillary adenopathy.  Neurological: A&O x3, Strenght is normal and symmetric bilaterally, cranial nerve II-XII are grossly intact, no focal motor deficit, sensory intact to light touch bilaterally.  Skin: Dry scaly, chronic lesions of psoriasis on the extensor surface of the elbow, forearm, thighs, legs, back.       Assessment & Plan:

## 2010-11-21 ENCOUNTER — Other Ambulatory Visit: Payer: Self-pay | Admitting: *Deleted

## 2010-11-21 DIAGNOSIS — K861 Other chronic pancreatitis: Secondary | ICD-10-CM

## 2010-11-21 MED ORDER — OXYCODONE-ACETAMINOPHEN 7.5-325 MG PO TABS: 1.0000 | ORAL_TABLET | ORAL | Status: DC | PRN

## 2010-11-21 MED ORDER — MORPHINE SULFATE CR 60 MG PO TB12: 60.0000 mg | ORAL_TABLET | Freq: Two times a day (BID) | ORAL | Status: DC

## 2010-11-21 NOTE — Telephone Encounter (Signed)
Pt may pick up friday

## 2010-11-23 ENCOUNTER — Inpatient Hospital Stay (HOSPITAL_COMMUNITY)
Admission: EM | Admit: 2010-11-23 | Discharge: 2010-11-27 | DRG: 440 | Disposition: A | Discharge status: Home / Self Care | Payer: Medicaid Other | Attending: Internal Medicine | Admitting: Internal Medicine

## 2010-11-23 ENCOUNTER — Emergency Department (HOSPITAL_COMMUNITY): Payer: Medicaid Other

## 2010-11-23 ENCOUNTER — Encounter: Payer: Self-pay | Admitting: Internal Medicine

## 2010-11-23 DIAGNOSIS — Z79899 Other long term (current) drug therapy: Secondary | ICD-10-CM

## 2010-11-23 DIAGNOSIS — K861 Other chronic pancreatitis: Secondary | ICD-10-CM | POA: Diagnosis present

## 2010-11-23 DIAGNOSIS — K859 Acute pancreatitis without necrosis or infection, unspecified: Principal | ICD-10-CM | POA: Diagnosis present

## 2010-11-23 DIAGNOSIS — R1110 Vomiting, unspecified: Secondary | ICD-10-CM

## 2010-11-23 DIAGNOSIS — K319 Disease of stomach and duodenum, unspecified: Secondary | ICD-10-CM | POA: Diagnosis present

## 2010-11-23 DIAGNOSIS — R1900 Intra-abdominal and pelvic swelling, mass and lump, unspecified site: Secondary | ICD-10-CM

## 2010-11-23 DIAGNOSIS — E876 Hypokalemia: Secondary | ICD-10-CM | POA: Diagnosis present

## 2010-11-23 DIAGNOSIS — K5900 Constipation, unspecified: Secondary | ICD-10-CM | POA: Diagnosis present

## 2010-11-23 DIAGNOSIS — J4489 Other specified chronic obstructive pulmonary disease: Secondary | ICD-10-CM | POA: Diagnosis present

## 2010-11-23 DIAGNOSIS — J449 Chronic obstructive pulmonary disease, unspecified: Secondary | ICD-10-CM | POA: Diagnosis present

## 2010-11-23 DIAGNOSIS — F411 Generalized anxiety disorder: Secondary | ICD-10-CM | POA: Diagnosis present

## 2010-11-23 DIAGNOSIS — R109 Unspecified abdominal pain: Secondary | ICD-10-CM

## 2010-11-23 LAB — CBC
Hemoglobin: 13.8 g/dL (ref 13.0–17.0)
MCH: 30.9 pg (ref 26.0–34.0)
RBC: 4.46 MIL/uL (ref 4.22–5.81)

## 2010-11-23 LAB — DIFFERENTIAL
Basophils Relative: 1 % (ref 0–1)
Lymphs Abs: 2.9 10*3/uL (ref 0.7–4.0)
Monocytes Relative: 5 % (ref 3–12)
Neutro Abs: 4.8 10*3/uL (ref 1.7–7.7)
Neutrophils Relative %: 57 % (ref 43–77)

## 2010-11-23 LAB — COMPREHENSIVE METABOLIC PANEL
ALT: 6 U/L (ref 0–53)
AST: 15 U/L (ref 0–37)
Alkaline Phosphatase: 98 U/L (ref 39–117)
CO2: 22 mEq/L (ref 19–32)
Chloride: 105 mEq/L (ref 96–112)
Creatinine, Ser: 0.72 mg/dL (ref 0.4–1.5)
GFR calc Af Amer: >60 mL/min (ref >60)
GFR calc non Af Amer: >60 mL/min (ref >60)
Potassium: 3.9 mEq/L (ref 3.5–5.1)
Total Bilirubin: 0.2 mg/dL — ABNORMAL LOW (ref 0.3–1.2)

## 2010-11-23 LAB — URINALYSIS, ROUTINE W REFLEX MICROSCOPIC
Hgb urine dipstick: NEGATIVE
Ketones, ur: NEGATIVE mg/dL
Protein, ur: NEGATIVE mg/dL
Urobilinogen, UA: 0.2 mg/dL (ref 0.0–1.0)

## 2010-11-23 MED ORDER — IOHEXOL 300 MG/ML  SOLN
80.0000 mL | Freq: Once | INTRAMUSCULAR | Status: AC | PRN
  Administered 2010-11-23: 80 mL via INTRAVENOUS

## 2010-11-23 NOTE — Progress Notes (Signed)
Hospital Admission Note Date: 11/23/2010  Patient name: [name] Medical record number: [id number] Date of birth: 08-28-1960 Age: 50 y.o. Gender: male PCP: Clerance Lav, MD  Medical Service: IMTS  Attending physician:  Dr. Anderson Malta  Pager: Resident [phone]): Dr. Gilford Rile   Pager: [phone] Resident (R1): Dr. Anselm Jungling    Pager: [phone]  Chief Complaint: abdominal pain/n/v  History of Present Illness: Patient is a 50 y/o man w/ h/o chronic pancreatitis with recurrent admissions for acute flares, depression and HL presenting with abdominal pain, nausea and vomiting that started this am. Patient describes the abdominal pain as sharp, located diffusely but more so around the epigastrium and associated with nausea, 2-3 episodes of non bloody non bilious emesis and 1 episode of watery diarrhea, now resolved. He cannot identify any triggers and has not had any alcohol in the past 5 years. He states that this feels like his previous episodes of acute flares. He denies any recent trauma to the area, fever, chills, sob, chest pain, diaphoresis, melena, hematochezia, dysuria, headache, dizziness or any other systemic symptoms. He takes MS Contin and Percocet at home for his chronic pain but reported there was no relief this time around 2/2 vomiting and so decided to come into the ED for further evaluation.  No current facility-administered medications for this visit.   Current Outpatient Prescriptions  Medication Sig Dispense Refill  . amitriptyline (ELAVIL) 25 MG tablet Take 2 tablets (50 mg total) by mouth at bedtime as needed. For pain.   60 tablet  5  . amylase-lipase-protease (PANGESTYME MT) 48-16-48 MU per capsule Take 5-6 capsules by mouth. Before each meal.       . clobetasol (TEMOVATE) 0.05 % ointment Apply topically 2 (two) times daily. To the affected area.        Marland Kitchen morphine (MS CONTIN) 60 MG 12 hr tablet Take 1 tablet (60 mg total) by mouth 2 (two) times daily.  60 tablet  0  .  Multiple Vitamin (MULTIVITAMIN) tablet Take 1 tablet by mouth daily.        . Omega-3 Fatty Acids (FISH OIL CONCENTRATE PO) Take 3 tablets by mouth daily.        Marland Kitchen omeprazole (PRILOSEC) 40 MG capsule Take 40 mg by mouth 2 (two) times daily.        Marland Kitchen oxyCODONE-acetaminophen (PERCOCET) 7.5-325 MG per tablet Take 1 tablet by mouth every 4 (four) hours as needed for pain. For pain.  180 tablet  0  . promethazine (PHENERGAN) 12.5 MG tablet Take 12.5 mg by mouth every 4 (four) hours as needed. For nausea.         Facility-Administered Medications Ordered in Other Visits  Medication Dose Route Frequency Provider Last Rate Last Dose  . iohexol (OMNIPAQUE) 300 MG/ML injection 80 mL  80 mL Intravenous Once PRN Medication Radiologist   80 mL at 11/23/10 2104    Allergies: Review of patient's allergies indicates no known allergies.  Past Medical History  Diagnosis Date  . Chronic pancreatitis   . Psoriasis   . Chronic pancreatitis   . Psoriasis     extensive, responds to strong steroids, unable to start molecular therapies due to cost  . Depression   . Hyperlipidemia   . Heme positive stool     neg colonoscopies 11/2008 and 01/2008  . Duodenitis     by EGD 1/07  . RUE weakness     2/2 cervical spondylosis  . Emphysema     unclear diagnosis noted in  previous EMR  . Smoking   . History of alcohol abuse   ? History of adrenal insufficiency - is/was supposed to be on Decadron on outpatient basis. Workup for this   During 2010 admission was non revealing (random cortisol, ACTH stim test, TSH, FSH)  Past Surgical History  Procedure Date  . Tympanoplasty     Family History  Problem Relation Age of Onset  . Diabetes Mother   . Heart disease Father   . Cancer Father     ?kidney or liver cancer    History   Social History  . Marital Status: Legally Separated    Spouse Name: N/A    Number of Children: N/A  . Years of Education: N/A   Occupational History  . Not on file.   Social  History Main Topics  . Smoking status: Current Everyday Smoker -- 1.0 packs/day for 15 years    Types: Cigarettes  . Smokeless tobacco: Not on file   Comment: PATIENT HAS CUT DOWN ON THE AMOUNT HE SMOKES  . Alcohol Use: No     quit about 2-3 years ago  . Drug Use: Yes    Special: Marijuana  . Sexually Active: Not on file     trying for disability to gain medicare coverage, has pain contract and warned that if tested positive again for cocaine will not be given narcotics   Other Topics Concern  . Not on file   Social History Narrative   Financial assistance approved for 100% discount at [facility] and [facility]  March 08, 2010 2:17 PM    Review of Systems: Pertinent items are noted in HPI.  Physical Exam: Vitals: t 96.9, p 60-80, rr 18, bp 90-117/59-69, O2 sat 97%RA General: Vital signs reviewed and noted. Well-developed, well-nourished, in no acute distress; alert, appropriate and cooperative throughout examination.  Head: Normocephalic, atraumatic.        Throat: Oropharynx nonerythematous, no exudate appreciated.   Neck: No deformities, masses, or tenderness noted.  Lungs:  Normal respiratory effort. Clear to auscultation BL without crackles or wheezes.  Heart: RRR. S1 and S2 normal without gallop, murmur, or rubs.  Abdomen:  BS hypoactive. Soft, Nondistended, tender to palpation diffusely with voluntary guarding.  No masses or organomegaly.  Extremities: No pretibial edema.    Lab results:   WBC                                      8.4               4.0-10.5         K/uL  RBC                                      4.46              4.22-5.81        MIL/uL  Hemoglobin (HGB)                         13.8              13.0-17.0        g/dL  Hematocrit (HCT)  40.1              39.0-52.0        %  MCV                                      89.9              78.0-100.0       fL  MCH -                                    30.9               26.0-34.0        pg  MCHC                                     34.4              30.0-36.0        g/dL  RDW                                      12.8              11.5-15.5        %  Platelet Count (PLT)                     237               150-400          K/uL  Neutrophils, %                           57                43-77            %  Lymphocytes, %                           35                12-46            %  Monocytes, %                             5                 3-12             %  Eosinophils, %                           3                 0-5              %  Basophils, %                             1  0-1              %  Neutrophils, Absolute                    4.8               1.7-7.7          K/uL  Lymphocytes, Absolute                    2.9               0.7-4.0          K/uL  Monocytes, Absolute                      0.4               0.1-1.0          K/uL  Eosinophils, Absolute                    0.3               0.0-0.7          K/uL  Basophils, Absolute                      0.0               0.0-0.1          K/uL   Sodium (NA)                              139               135-145          mEq/L  Potassium (K)                            3.9               3.5-5.1          mEq/L  Chloride                                 105               96-112           mEq/L  CO2                                      22                19-32            mEq/L  Glucose                                  121        h      70-99            mg/dL  BUN  12                6-23             mg/dL  Creatinine                               0.72              0.4-1.5          mg/dL  GFR, Est Non African American            >60               >60              mL/min  GFR, Est African American                >60               >60              mL/min    Oversized comment, see footnote  1  Bilirubin, Total                         0.2        l      0.3-1.2           mg/dL  Alkaline Phosphatase                     98                39-117           U/L  SGOT (AST)                               15                0-37             U/L  SGPT (ALT)                               6                 0-53             U/L  Total  Protein                           7.4               6.0-8.3          g/dL  Albumin-Blood                            3.8               3.5-5.2          g/dL  Calcium                                  9.4               8.4-10.5         mg/dL   Lipase  33                11-59            U/L   Appearance                               CLEAR             CLEAR  Specific Gravity                         1.042      h      1.005-1.030  pH                                       5.0               5.0-8.0  Urine Glucose                            NEGATIVE          NEG              mg/dL  Bilirubin                                SMALL      a      NEG  Ketones                                  NEGATIVE          NEG              mg/dL  Blood                                    NEGATIVE          NEG  Protein                                  NEGATIVE          NEG              mg/dL  Urobilinogen                             0.2               0.0-1.0          mg/dL  Nitrite                                  NEGATIVE          NEG  Leukocytes                               NEGATIVE          NEG    MICROSCOPIC NOT DONE ON URINES WITH NEGATIVE PROTEIN, BLOOD, LEUKOCYTES,  NITRITE, OR GLUCOSE <1000 mg/dL.  Imaging results:   CT ABDOMEN AND PELVIS WITH CONTRAST    Technique:  Multidetector CT imaging of the abdomen and pelvis was   performed following the standard protocol during bolus   administration of intravenous contrast.    Contrast: 80 ml Omnipaque-300    Comparison: CT abdomen pelvis of 03/23/2010    Findings: Linear scarring in the right lower lobe and bullous   formation in the right middle lobe appear  stable.  The liver   enhances with no focal abnormality and no ductal dilatation is   seen.  The gallbladder is contracted and no calcified gallstones   are seen.  There is calcification within the head of the pancreas   in this patient with history of chronic pancreatitis.  The more   distal pancreas is unremarkable and the pancreatic duct is not   significantly dilated.  The adrenal glands and spleen are   unremarkable.  The stomach is moderately fluid distended with   contrast and air.  However there is a rounded filling defect within   the distal antrum of the stomach which may extend in the to the   duodenum.  Although this could represent retained food debris, the   attenuation is very similar to adjacent hepatic and splenic   parenchyma, and a soft tissue mass cannot be excluded.  If further   assessment is warranted endoscopy would be recommended.  The   kidneys enhance with no calculus or mass and no hydronephrosis is   seen.  The abdominal aorta is normal in caliber with moderate   atheromatous change.    The appendix appears normal within the right lower quadrant,   filling with air.  The terminal ileum also is unremarkable.  The   urinary bladder is moderately urine distended and unremarkable.   The prostate is normal in size.  No pelvic mass or fluid is seen.   Degenerative disc disease is present at the L5-S1 level.    IMPRESSION:    1.  Rounded soft tissue mass-like opacity within the distal antrum   of the stomach possibly extending into the duodenum suspicious for   soft tissue mass.  Correlate clinically and consider endoscopy to   assess further if warranted.   2.  Changes of chronic calcific pancreatitis.   Assessment & Plan by Problem:  1. Abdominal pain/ nausea/vomiting - Likely 2/2 acute on chronic pancreatitis, although etiology unclear. LFTs also normal, ruling out possible hepatitis or cholecystitis/choledocholithiasis. This could also be 2/2  GERD/duodenitis (EGD in 2007 consistent with this).  He is also hemodynamically stable, afebrile and without a white count, so infection/ infectious colitis is less likely. However, CT scan is concerning for a possible mass, although clinical exam is not overwhelmingly concerning for an obstruction. Discussed with radiology on call attending, suggested an acute abdominal series to further evaluate. - admit to regular bed - Conservative management with IVF-NS @ 125cc/hr, bowel rest, Morphine PCA for pain - discuss with attending plans for ?abdominal mass - check UDS, alcohol level and FLP (triglyceride level 133 in 01/2010) - Protonix 40mg  IV BID - monitor for worsening signs of obstruction and obtain EGD/abdominal series as indicated  2. H/O Gastritis - continue PPI  3. Anxiety - Ativan 1mg  IV q4h prn and switch to po once able to tolerate po  4. ? H/o COPD - not in respiratory distress, no is he symptomatic - Albuterol inhaler prn - can be worked up  further in the outpatient setting as indicated  5. DVT ppx - Heparin

## 2010-11-24 LAB — LIPID PANEL
Cholesterol: 157 mg/dL (ref 0–200)
LDL Cholesterol: 86 mg/dL (ref 0–99)
VLDL: 51 mg/dL — ABNORMAL HIGH (ref 0–40)

## 2010-11-24 LAB — CBC
HCT: 34.6 % — ABNORMAL LOW (ref 39.0–52.0)
Hemoglobin: 11.8 g/dL — ABNORMAL LOW (ref 13.0–17.0)
MCH: 30.8 pg (ref 26.0–34.0)
MCHC: 34.1 g/dL (ref 30.0–36.0)
RBC: 3.83 MIL/uL — ABNORMAL LOW (ref 4.22–5.81)

## 2010-11-24 LAB — APTT: aPTT: 30 seconds (ref 24–37)

## 2010-11-24 LAB — TYPE AND SCREEN

## 2010-11-24 LAB — DRUGS OF ABUSE SCREEN W/O ALC, ROUTINE URINE
Amphetamine Screen, Ur: NEGATIVE
Barbiturate Quant, Ur: NEGATIVE
Cocaine Metabolites: NEGATIVE
Creatinine,U: 47.7 mg/dL
Methadone: NEGATIVE
Opiate Screen, Urine: POSITIVE — AB

## 2010-11-24 LAB — BASIC METABOLIC PANEL
BUN: 9 mg/dL (ref 6–23)
Calcium: 7.9 mg/dL — ABNORMAL LOW (ref 8.4–10.5)
Creatinine, Ser: 0.65 mg/dL (ref 0.4–1.5)
GFR calc Af Amer: >60 mL/min (ref >60)

## 2010-11-24 LAB — ABO/RH: ABO/RH(D): O POS

## 2010-11-24 LAB — PROTIME-INR: Prothrombin Time: 13.1 seconds (ref 11.6–15.2)

## 2010-11-25 LAB — BASIC METABOLIC PANEL
BUN: 5 mg/dL — ABNORMAL LOW (ref 6–23)
CO2: 18 mEq/L — ABNORMAL LOW (ref 19–32)
Calcium: 8.1 mg/dL — ABNORMAL LOW (ref 8.4–10.5)
Creatinine, Ser: 0.55 mg/dL (ref 0.4–1.5)
GFR calc non Af Amer: >60 mL/min (ref >60)
Glucose, Bld: 79 mg/dL (ref 70–99)

## 2010-11-25 LAB — CBC
HCT: 36.8 % — ABNORMAL LOW (ref 39.0–52.0)
Hemoglobin: 12.9 g/dL — ABNORMAL LOW (ref 13.0–17.0)
MCH: 31 pg (ref 26.0–34.0)
MCHC: 35.1 g/dL (ref 30.0–36.0)
MCV: 88.5 fL (ref 78.0–100.0)
RDW: 12.3 % (ref 11.5–15.5)

## 2010-11-26 DIAGNOSIS — R1900 Intra-abdominal and pelvic swelling, mass and lump, unspecified site: Secondary | ICD-10-CM

## 2010-11-26 DIAGNOSIS — R109 Unspecified abdominal pain: Secondary | ICD-10-CM

## 2010-11-26 DIAGNOSIS — R1110 Vomiting, unspecified: Secondary | ICD-10-CM

## 2010-11-26 LAB — CBC
Hemoglobin: 12.6 g/dL — ABNORMAL LOW (ref 13.0–17.0)
MCH: 30.1 pg (ref 26.0–34.0)
MCHC: 34.3 g/dL (ref 30.0–36.0)
Platelets: 225 10*3/uL (ref 150–400)
RDW: 12.5 % (ref 11.5–15.5)

## 2010-11-26 LAB — BASIC METABOLIC PANEL
CO2: 24 mEq/L (ref 19–32)
Calcium: 8.3 mg/dL — ABNORMAL LOW (ref 8.4–10.5)
Creatinine, Ser: 0.52 mg/dL (ref 0.4–1.5)
GFR calc Af Amer: >60 mL/min (ref >60)
GFR calc non Af Amer: >60 mL/min (ref >60)
Sodium: 139 mEq/L (ref 135–145)

## 2010-11-27 ENCOUNTER — Inpatient Hospital Stay (HOSPITAL_COMMUNITY): Payer: Medicaid Other

## 2010-11-27 DIAGNOSIS — R109 Unspecified abdominal pain: Secondary | ICD-10-CM

## 2010-11-27 DIAGNOSIS — R1900 Intra-abdominal and pelvic swelling, mass and lump, unspecified site: Secondary | ICD-10-CM

## 2010-11-27 DIAGNOSIS — R1110 Vomiting, unspecified: Secondary | ICD-10-CM

## 2010-11-27 LAB — CBC
MCH: 31 pg (ref 26.0–34.0)
Platelets: 223 10*3/uL (ref 150–400)
RBC: 4.03 MIL/uL — ABNORMAL LOW (ref 4.22–5.81)

## 2010-11-27 LAB — BASIC METABOLIC PANEL
Calcium: 8.4 mg/dL (ref 8.4–10.5)
Chloride: 108 mEq/L (ref 96–112)
Creatinine, Ser: 0.67 mg/dL (ref 0.4–1.5)
GFR calc Af Amer: >60 mL/min (ref >60)
Sodium: 140 mEq/L (ref 135–145)

## 2010-11-28 ENCOUNTER — Telehealth: Payer: Self-pay | Admitting: *Deleted

## 2010-11-28 LAB — OPIATE, QUANTITATIVE, URINE
Codeine Urine: NEGATIVE NG/ML
Hydromorphone GC/MS Conf: 833 NG/ML — ABNORMAL HIGH
Morphine, Confirm: 11644 NG/ML — ABNORMAL HIGH

## 2010-11-28 NOTE — Telephone Encounter (Signed)
Pt. was given  rxs for Percocet 7.5/325 and Morphine 60mg  upon d/c from the hospital. He just received rxs. From the clinic so his mother bought the ones from the hospital  to the clinic to be destroyed.  Also not to violate his contract. Rxs destroyed;witnessed by Angelina Ok.

## 2010-12-06 ENCOUNTER — Encounter: Payer: Self-pay | Admitting: Internal Medicine

## 2010-12-06 ENCOUNTER — Ambulatory Visit (INDEPENDENT_AMBULATORY_CARE_PROVIDER_SITE_OTHER): Payer: Self-pay | Admitting: Internal Medicine

## 2010-12-06 DIAGNOSIS — L408 Other psoriasis: Secondary | ICD-10-CM

## 2010-12-06 DIAGNOSIS — K861 Other chronic pancreatitis: Secondary | ICD-10-CM

## 2010-12-06 NOTE — Assessment & Plan Note (Signed)
Using steroid creams. Applied for disability and medicaid. Hopefully he will get approved and then can be started on new biological agents to treat his psoriasis.

## 2010-12-06 NOTE — Assessment & Plan Note (Signed)
Stable after discharge. Continue on current pain regimen

## 2010-12-06 NOTE — Progress Notes (Signed)
  Subjective:    Patient ID: [name], male    DOB: 1960-09-12, 50 y.o.   MRN: [id number]  HPI  Mr. Filippi is a pleasant 50 year old man with a past with a history of chronic pancreatitis, psoriasis who comes to the clinic for follow up. He was recently hospitalised for pancreatitis and is doing well after the discharge. He is having psoriatic skin lesions for about 4 years now. He started with his elbows and knee and then spread to his forearm, eyes, back, legs. He itches constantly and is also painful when he walks due stretching of the chronic dry lesions. He says that taking shower is getting painful lately and so he wants to get evaluated and treated by dermatologist. His referral is still in works.  he is smoking, which he says is not able to quit. Denies any fever, chills, recent weight loss, night sweats, chest pain, headache, vision changes, muscle weakness, sensation changes. He gets short of breath while walking up in the mailbox very frequently now, and is not diagnosed of COPD.    Review of Systems     As per history of present illness  Objective:   Physical Exam     Constitutional: Vital signs reviewed.  Patient is a well-developed and well-nourished in no acute distress and cooperative with exam. Alert and oriented x3.  Head: Normocephalic and atraumatic Mouth: no erythema or exudates, MMM Eyes: PERRL, EOMI, conjunctivae normal, No scleral icterus.  Neck: Supple, Trachea midline normal ROM, No JVD Cardiovascular: RRR, S1 normal, S2 normal, no MRG, pulses symmetric and intact bilaterally Pulmonary/Chest: CTAB, no wheezes, rales, or rhonchi Abdominal: Soft. Mildly tender to palpation in epigastric region. non-distended, bowel sounds are normal, no masses, organomegaly, or guarding present.  Musculoskeletal: No joint deformities, erythema, or stiffness, ROM full and no nontender Hematology: no cervical, inginal, or axillary adenopathy.  Neurological: A&O x3, Strenght  is normal and symmetric bilaterally, cranial nerve II-XII are grossly intact, no focal motor deficit, sensory intact to light touch bilaterally.  Skin: Dry scaly, chronic lesions of psoriasis on the extensor surface of the elbow, forearm, thighs, legs, back.       Assessment & Plan:

## 2010-12-06 NOTE — Patient Instructions (Signed)
Return in one month.  

## 2010-12-19 ENCOUNTER — Other Ambulatory Visit: Payer: Self-pay | Admitting: *Deleted

## 2010-12-19 DIAGNOSIS — K861 Other chronic pancreatitis: Secondary | ICD-10-CM

## 2010-12-19 MED ORDER — OXYCODONE-ACETAMINOPHEN 7.5-325 MG PO TABS: 1.0000 | ORAL_TABLET | ORAL | Status: DC | PRN

## 2010-12-19 MED ORDER — MORPHINE SULFATE CR 60 MG PO TB12: 60.0000 mg | ORAL_TABLET | Freq: Two times a day (BID) | ORAL | Status: DC

## 2010-12-24 ENCOUNTER — Other Ambulatory Visit: Payer: Self-pay | Admitting: Internal Medicine

## 2010-12-24 DIAGNOSIS — K861 Other chronic pancreatitis: Secondary | ICD-10-CM

## 2010-12-24 MED ORDER — MORPHINE SULFATE CR 60 MG PO TB12: 60.0000 mg | ORAL_TABLET | Freq: Two times a day (BID) | ORAL | Status: DC

## 2010-12-24 MED ORDER — OXYCODONE-ACETAMINOPHEN 7.5-325 MG PO TABS: 1.0000 | ORAL_TABLET | ORAL | Status: DC | PRN

## 2011-01-09 NOTE — Discharge Summary (Signed)
[name], [name] NO.:  [id number]  MEDICAL RECORD NO.:  [id number]  LOCATION:  5125                         FACILITY:  [facility]  PHYSICIAN:  [name], D.O.DATE OF BIRTH:  12-Mar-1961  DATE OF ADMISSION:  11/23/2010 DATE OF DISCHARGE:  11/27/2010                              DISCHARGE SUMMARY   DISCHARGE DIAGNOSES: 1. Acute on chronic pancreatitis. 2. History of gastritis. 3. Anxiety. 4. Questionable gastric mass which was likely secondary to food or     debris as confirmed with upper gastrointestinal series.  DISCHARGE MEDICATIONS: 1. Phenergan 12 mg p.o. q.6 h. as needed for nausea and vomiting. 2. Sorbitol 70% solution take 1 teaspoon with water or juice every     morning for constipation, the patient may take a maximum of 5     teaspoons per day if needed for constipation. 3. Pancrease 5 capsules t.i.d. with meals. 4. Amitriptyline 25 mg 2 tablets by mouth at bedtime p.r.n. 5. Fish oil 1 tablet by mouth daily. 6. Multivitamin daily. 7. Morphine sulfate CR 60 mg p.o. b.i.d. 8. Prilosec 40 mg p.o. daily. 9. Percocet 7.5/325 p.o. q.4 h. p.r.n. for pain. 10.Xanax 0.5 mg b.i.d. p.r.n. anxiety.  DISPOSITION AND FOLLOWUP:  [name] was discharged from [facility] on November 27, 2010, in stable and improved condition.  His acute abdominal pain has resolved.  No nausea, vomiting, or fever.  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 He will have an appointment with his primary care physician, [name], on December 06, 2010, at 11:15 a.m.  At that time, he will need to reevaluate his abdominal pain.  Please consider starting medication for his elevated triglycerides of 255 as the patient does not have insurance at this time.  CONSULTATION:  None.  PROCEDURE PERFORMED: 1. Acute abdomen on November 23, 2010, shows COPD/chronic  changes.  No     acute finding in the abdomen. 2. November 23, 2010, CT of abdomen and pelvis with contrast shows rounded     soft tissue mass like opacity within the distal antrum of his     stomach, possibly extending into the duodenum, suspicious for soft     tissue mass.  Correlate clinically and consider endoscopy to assess     further if warranted.  Changes of chronic calcific pancreatitis. 3. Upper GI series on November 27, 2010, shows prominent cricopharyngeus     muscle impression.  No Zenker diverticulum.  On the previous CT     examination, there was filling defect within the distal stomach and     the proximal duodenum.  Apparently, this represented food debris.     No filling defect could be identified on examination of the stomach     or duodenum with barium and air.  Images were obtained of this area     of the distal stomach and proximal duodenum with contrast alone and     distended with air with double contrast technique.  No filling     defects or mass could be appreciated.  Mucosal pattern of the  stomach and duodenum appeared normal.  No mass, inflammation,     obstruction, or ulceration was evident.  ADMISSION HISTORY:  [name] is a 50 year old man with past medical history of chronic pancreatitis with recurrent admission for acute flare, depression, hyperlipidemia, psoriasis, and history of gastritis presents for abdominal pain, nausea, and vomiting that started on the day of admission.  He described the abdominal pain as sharp, located diffusely but more so around the epigastrium and associated with nausea, 2 to 3 episode of nonbloody and nonbilious emesis and one episodes of watery diarrhea, now resolved.  He cannot identify any triggers and has not had any alcohol in the past 5 years.  He states that this feels like his previous episode of acute flare.  He denies any recent trauma to the area, fever, chills, shortness of breath, chest pain, diaphoresis, melena,  hematochezia, dysuria, headache, dizziness, or any systemic symptoms.  He takes MS Contin and Percocet at home with this chronic pain but reported there was no relief this time secondary to vomiting.  ADMISSION PHYSICAL EXAMINATION:  VITAL SIGNS:  Temperature 96.9, pulse 60 to 80, respiration 18, blood pressure 90 to 117 over 59 to 69, O2 sat 97% on room air. GENERAL:  Well developed, well-nourished, in no acute distress, alert, and appropriate and cooperative throughout examination HEAD:  Normocephalic, atraumatic. THROAT:  Oropharynx nonerythematous.  No exudate appreciated. NECK:  No deformities, masses, or tenderness noted. LUNGS:  Normal respiratory effort, clear to auscultation bilaterally without crackle, wheezes. HEART:  Regular rate and rhythm.  S1 and S2 normal without murmur, gallop, or rub. ABDOMEN:  Bowel sounds hypoactive, soft, nondistended, tender to palpation diffusely with voluntary guarding.  No masses or organomegaly. EXTREMITIES:  No edema or cyanosis. NEUROLOGIC:  Alert and oriented x3, cranial nerves II through XII grossly intact.  Motor strength 5/5 upper and lower extremity bilaterally, intact sensation to light touch.  Normal reflexes.  ADMISSION LABS:  WBC 8.4, hemoglobin 13.8, hematocrit 40.1, MCV 89.9, platelet 239.  Differential was within normal limits.  Sodium 139, potassium 3.9, chloride 105, bicarb 22, BUN 12, creatinine 0.72, glucose 121.  Total bili 0.2, alk phos 98, AST 15, ALT 6, total protein 7.4, albumin 3.8, calcium 9.4, lipase 33.  Urinalysis shows small bilirubin.  HOSPITAL COURSE: 1. Abdominal pain/nausea and vomiting likely secondary to acute on     chronic pancreatitis.  LFTs were within normal limits ruling out     possible hepatitis or cholecystitis, choledocholelithiasis.  His     presentation could also be secondary to GERD, duodenitis (EGD in     2007 consistent with this).  The patient was hemodynamically     stable, remained  afebrile and without a leukocytosis throughout the     hospital course, therefore, infectious etiology was less likely.     His CT scan on admission shows a questionable soft tissue mass at     the distal of the stomach and the proximal duodenum, which likely     represents food or debris.  Subsequently, we obtained a upper GI     series on November 27, 2010, for further evaluation which shows no     filling defects or mass.  The patient was placed on PCA morphine     pump for pain control which was later switched to his home pain     medication regimen which includes MS Contin 60 mg p.o. b.i.d. and     Percocet 7.5/325 q.4 h. p.r.n. pain.  His pain continued to improve     as well as nausea and vomiting, which was controlled with Zofran.     The patient tolerated clear liquids and subsequently regular diet     on day 2 and 3 of hospital admission, and that his acute abdominal     pain has resolved and is now back to his chronic baseline.  We also     checked his UDS which only shows positive for opiates.  Alcohol     level was less than 11 and urinalysis showed small bilirubin.  The     patient was also placed on Protonix 40 mg IV b.i.d.  His abdominal     pain can also be secondary to constipation which can also be seen     on his acute abdomen x-ray.  The patient was started on sorbitol     70% 1 teaspoon up to maximum of 5 teaspoons q.a.m. to relieve his     constipation.  The patient also reports relief after he has bowel     movements.  Sorbitol may be indicated for long-term treatment as     the patient is on chronic narcotics opiates.  We also checked     fasting lipid profile panel which shows a cholesterol of 157,     triglyceride 255, HDL 20, LDL 86. 2. History of gastritis.  We continued PPI. 3. Anxiety.  We gave Ativan 1 mg IV q.4 h. p.r.n. for anxiety. 4. DVT prophylaxis, heparin 5000 units subcu t.i.d. 5. Hypokalemia, which is likely from poor oral intake.  Because on      admission his potassium was within normal limit of 3.9, the patient     was placed on bowel rest and n.p.o. for his upper GI series,     therefore he had poor oral intake.  We repleted potassium chloride 40 mEq     x1.  DISCHARGE VITALS:  Temperature 98.1, pulse 75, blood pressure 121/69, respiration 18, pulse 75, O2 sat 94% on room air.  DISCHARGE LABS:  CBC, WBC 7.2, hemoglobin 12.5, hematocrit 35.5, platelet 223.  Sodium 140, potassium 3.4, chloride 108, bicarb 24, BUN 6, creatinine 0.67, glucose 114, calcium 8.4.    ______________________________ Carrolyn Meiers, MD   ______________________________ [name], D.O.    MH/MEDQ  D:  11/27/2010  T:  11/28/2010  Job:  [id number]  cc:   Clerance Lav, MD PhD  Electronically Signed by Carrolyn Meiers MD on 12/11/2010 01:53:16 PM Electronically Signed by Anderson Malta D.O. on 01/09/2011 08:57:56 AM

## 2011-01-21 ENCOUNTER — Other Ambulatory Visit: Payer: Self-pay | Admitting: *Deleted

## 2011-01-21 DIAGNOSIS — K861 Other chronic pancreatitis: Secondary | ICD-10-CM

## 2011-01-21 MED ORDER — OXYCODONE-ACETAMINOPHEN 7.5-325 MG PO TABS: 1.0000 | ORAL_TABLET | ORAL | Status: DC | PRN

## 2011-01-21 MED ORDER — MORPHINE SULFATE CR 60 MG PO TB12: 60.0000 mg | ORAL_TABLET | Freq: Two times a day (BID) | ORAL | Status: DC

## 2011-01-21 NOTE — Telephone Encounter (Signed)
Pt would like to pick up on Friday.

## 2011-02-01 ENCOUNTER — Other Ambulatory Visit: Payer: Self-pay | Admitting: *Deleted

## 2011-02-01 MED ORDER — PROMETHAZINE HCL 12.5 MG PO TABS: 12.5000 mg | ORAL_TABLET | ORAL | Status: DC | PRN

## 2011-02-01 NOTE — Telephone Encounter (Signed)
Last refill 6/5 

## 2011-02-18 ENCOUNTER — Other Ambulatory Visit: Payer: Self-pay | Admitting: *Deleted

## 2011-02-18 DIAGNOSIS — K861 Other chronic pancreatitis: Secondary | ICD-10-CM

## 2011-02-18 NOTE — Telephone Encounter (Signed)
States he would like scripts early, he is going out of town

## 2011-02-19 NOTE — Telephone Encounter (Signed)
No early refills. This is a red flag for misuse. He has received early scripts before for this reason.

## 2011-02-19 NOTE — Telephone Encounter (Signed)
If he HAS to pick it up early to go out of town we will not fill until 5 days later the next time. Will also require pill bottle for count.

## 2011-02-21 ENCOUNTER — Telehealth: Payer: Self-pay | Admitting: Internal Medicine

## 2011-02-21 MED ORDER — OXYCODONE-ACETAMINOPHEN 7.5-325 MG PO TABS: ORAL_TABLET | ORAL | Status: DC

## 2011-02-21 NOTE — Telephone Encounter (Signed)
Patient's mother arrived to pick up his medication. Last UDS shoed no oxycodone, was positive for hydrocodone, metabolites oxymorphone and hydromorphone were both positive. Before this he has multiple inappropriate UDS screens. I called patient on speaker phone and also spoke with his mother about concerns about his opiate prescriptions.  1. His narcotic database report does not indicate that he is filling his medications- [facility] has supplied a list of his medications-obviously [facility] is not reporting to the [place] database or his scripts are disappearing? I cannot explain. 2. Hydrocodone was in his urine not prescribed. Urine did not have primary meds only small amopunts of metabolites-this may suggest misuse or psuedoaddiction.  I elected to prescribe him a taper of opiates and that we would need to get him into a pain center for pain management. I discussed our opiate policy in detail with he and his mother-they seemed strangely calm- his mother looked slightly tearful. He wanted to know why we have been prescribing to him for 8 years and all of a sudden would stop-I told him we had a new policy regarding prescription opiates and much tighter guidelines. He will need to discuss this with Dr. Tonny Branch at his next scheduled [facility] appointment,

## 2011-02-21 NOTE — Telephone Encounter (Signed)
Pt's mother came in to get narcotic Rx's early.  Talked with Dr Phillips Odor and she talked with pt and mother about early refill, last UDS results data base findings.  Rx was given for percocet taper, signed out by mother.  Pt has scheduled appointment 9/20 in clinic.  See note per Dr Nehemiah Massed.

## 2011-03-06 ENCOUNTER — Emergency Department (HOSPITAL_COMMUNITY)
Admission: EM | Admit: 2011-03-06 | Discharge: 2011-03-06 | Disposition: A | Discharge status: Home / Self Care | Payer: Medicaid Other | Attending: Emergency Medicine | Admitting: Emergency Medicine

## 2011-03-06 DIAGNOSIS — Z8711 Personal history of peptic ulcer disease: Secondary | ICD-10-CM | POA: Insufficient documentation

## 2011-03-06 DIAGNOSIS — R112 Nausea with vomiting, unspecified: Secondary | ICD-10-CM | POA: Insufficient documentation

## 2011-03-06 DIAGNOSIS — R1013 Epigastric pain: Secondary | ICD-10-CM | POA: Insufficient documentation

## 2011-03-06 DIAGNOSIS — R05 Cough: Secondary | ICD-10-CM | POA: Insufficient documentation

## 2011-03-06 DIAGNOSIS — R059 Cough, unspecified: Secondary | ICD-10-CM | POA: Insufficient documentation

## 2011-03-06 LAB — DIFFERENTIAL
Eosinophils Relative: 2 % (ref 0–5)
Lymphocytes Relative: 30 % (ref 12–46)
Lymphs Abs: 2.6 10*3/uL (ref 0.7–4.0)
Monocytes Absolute: 0.4 10*3/uL (ref 0.1–1.0)

## 2011-03-06 LAB — COMPREHENSIVE METABOLIC PANEL
Albumin: 3.6 g/dL (ref 3.5–5.2)
BUN: 11 mg/dL (ref 6–23)
Calcium: 9.5 mg/dL (ref 8.4–10.5)
Creatinine, Ser: 0.72 mg/dL (ref 0.50–1.35)
Total Bilirubin: 0.2 mg/dL — ABNORMAL LOW (ref 0.3–1.2)
Total Protein: 7.2 g/dL (ref 6.0–8.3)

## 2011-03-06 LAB — LIPASE, BLOOD: Lipase: 24 U/L (ref 11–59)

## 2011-03-06 LAB — CBC
HCT: 42 % (ref 39.0–52.0)
MCH: 30.9 pg (ref 26.0–34.0)
MCHC: 35 g/dL (ref 30.0–36.0)
MCV: 88.2 fL (ref 78.0–100.0)
RDW: 13.5 % (ref 11.5–15.5)

## 2011-03-08 ENCOUNTER — Emergency Department (HOSPITAL_COMMUNITY)
Admission: EM | Admit: 2011-03-08 | Discharge: 2011-03-08 | Disposition: A | Discharge status: Home / Self Care | Payer: Medicaid Other | Attending: Emergency Medicine | Admitting: Emergency Medicine

## 2011-03-08 DIAGNOSIS — R197 Diarrhea, unspecified: Secondary | ICD-10-CM | POA: Insufficient documentation

## 2011-03-08 DIAGNOSIS — Z8711 Personal history of peptic ulcer disease: Secondary | ICD-10-CM | POA: Insufficient documentation

## 2011-03-08 DIAGNOSIS — R112 Nausea with vomiting, unspecified: Secondary | ICD-10-CM | POA: Insufficient documentation

## 2011-03-08 DIAGNOSIS — R630 Anorexia: Secondary | ICD-10-CM | POA: Insufficient documentation

## 2011-03-08 DIAGNOSIS — R1013 Epigastric pain: Secondary | ICD-10-CM | POA: Insufficient documentation

## 2011-03-08 DIAGNOSIS — G8929 Other chronic pain: Secondary | ICD-10-CM | POA: Insufficient documentation

## 2011-03-09 ENCOUNTER — Encounter: Payer: Self-pay | Admitting: Internal Medicine

## 2011-03-09 ENCOUNTER — Observation Stay (HOSPITAL_COMMUNITY)
Admission: EM | Admit: 2011-03-09 | Discharge: 2011-03-10 | DRG: 440 | Disposition: A | Discharge status: Home / Self Care | Payer: Medicaid Other | Attending: Internal Medicine | Admitting: Internal Medicine

## 2011-03-09 DIAGNOSIS — E869 Volume depletion, unspecified: Secondary | ICD-10-CM | POA: Insufficient documentation

## 2011-03-09 DIAGNOSIS — K861 Other chronic pancreatitis: Secondary | ICD-10-CM | POA: Insufficient documentation

## 2011-03-09 DIAGNOSIS — Z23 Encounter for immunization: Secondary | ICD-10-CM | POA: Insufficient documentation

## 2011-03-09 DIAGNOSIS — R109 Unspecified abdominal pain: Secondary | ICD-10-CM

## 2011-03-09 DIAGNOSIS — R195 Other fecal abnormalities: Secondary | ICD-10-CM | POA: Insufficient documentation

## 2011-03-09 DIAGNOSIS — I959 Hypotension, unspecified: Secondary | ICD-10-CM | POA: Insufficient documentation

## 2011-03-09 DIAGNOSIS — F1010 Alcohol abuse, uncomplicated: Secondary | ICD-10-CM | POA: Insufficient documentation

## 2011-03-09 DIAGNOSIS — R1013 Epigastric pain: Principal | ICD-10-CM | POA: Insufficient documentation

## 2011-03-09 DIAGNOSIS — F17200 Nicotine dependence, unspecified, uncomplicated: Secondary | ICD-10-CM | POA: Insufficient documentation

## 2011-03-09 LAB — COMPREHENSIVE METABOLIC PANEL
ALT: 8 U/L (ref 0–53)
AST: 18 U/L (ref 0–37)
CO2: 19 mEq/L (ref 19–32)
Chloride: 100 mEq/L (ref 96–112)
Creatinine, Ser: 0.65 mg/dL (ref 0.50–1.35)
GFR calc non Af Amer: >60 mL/min (ref >60)
Total Bilirubin: 0.3 mg/dL (ref 0.3–1.2)

## 2011-03-09 LAB — DIFFERENTIAL
Eosinophils Absolute: 0.1 10*3/uL (ref 0.0–0.7)
Lymphocytes Relative: 31 % (ref 12–46)
Lymphs Abs: 3.4 10*3/uL (ref 0.7–4.0)
Neutrophils Relative %: 63 % (ref 43–77)

## 2011-03-09 LAB — CBC
MCV: 87.5 fL (ref 78.0–100.0)
Platelets: 234 10*3/uL (ref 150–400)
RBC: 5.05 MIL/uL (ref 4.22–5.81)
WBC: 11 10*3/uL — ABNORMAL HIGH (ref 4.0–10.5)

## 2011-03-09 NOTE — H&P (Signed)
Hospital Admission Note Date: 03/09/2011  Patient name: [name] Medical record number: [id number] Date of birth: 05/17/1961 Age: 50 y.o. Gender: male PCP: Blanca Friend, MD, MD  Medical Service: Teaching Service  Attending physician: Dr. Phillips Odor Resident (R2): Dr. Anselm Jungling    Pager: [phone] Resident (R1): Dr. Dorise Hiss    Pager: [phone]  Chief Complaint: Abdominal pain, "pancreatitis"  History of Present Illness: Patient is a 50 year old man with PMH of chronic pancreatitis, gallstones, nephrolithiasis, PUD p/w complaints of abdominal pain which he attributes to pancreatitis.  He notes that the pain has been worsening x 5 days.  He reports coming to the ED 5 days prior to admission and received 20 pills of percocet, which has managed his pain well, but he has now completed that supply.  He notes that the pain is constant & nonradiating but is unable to clarify quality.  The pain is 10+/10 at worst, 3/10 at best (he notes that because of chronic pancreatitis he is at 3/10 baseline) and 8/10 currently in the ED, after receiving 5mg  dilaudid/3h.  He reports that his last episode of acute pancreatitis 1 year ago.  He has not eaten anything since pain onset. He reports cold sweats, but no fever.  He endorses dry heaving and tarry diarrhea 5 days ago.  His last bout of diarrhea was 4 days ago.  He has not had any alcohol in 4 years.    Of note, he has not been taking supplementary pancreatic enzymes x 1 year 2/2 financial constraints, but his medicaid has just started.  He has been on a pain regimen from the [facility] that he reports to be long acting morphine 60mg  BID with percocet for breakthrough pain, but he notes that he has recently been taken off of the pain medications.  Meds: Non compliant with medications currently.  Current Outpatient Prescriptions  Medication Sig Dispense Refill  . amitriptyline (ELAVIL) 25 MG tablet Take 2 tablets (50 mg total) by mouth at bedtime as needed. For pain.  60 tablet  5  . amylase-lipase-protease (PANGESTYME MT) 48-16-48 MU per capsule Take 5-6 capsules by mouth. Before each meal.       . clobetasol (TEMOVATE) 0.05 % ointment Apply topically 2 (two) times daily. To the affected area.        Marland Kitchen morphine (MS CONTIN) 60 MG 12 hr tablet Take 1 tablet (60 mg total) by mouth 2 (two) times daily. May fill 01/23/11  60 tablet  0  . Multiple Vitamin (MULTIVITAMIN) tablet Take 1 tablet by mouth daily.        . Omega-3 Fatty Acids (FISH OIL CONCENTRATE PO) Take 3 tablets by mouth daily.        Marland Kitchen omeprazole (PRILOSEC) 40 MG capsule Take 40 mg by mouth 2 (two) times daily.        Marland Kitchen oxyCODONE-acetaminophen (PERCOCET) 7.5-325 MG per tablet Use 1 tablet every 4 hours for 1 week, 1 tablet every 8 hours for 1 week, then 1 tablet every 12 hours for 1 week, then 1 tablet daily for 1 week then stop.  98 tablet  0  . promethazine (PHENERGAN) 12.5 MG tablet Take 1 tablet (12.5 mg total) by mouth every 4 (four) hours as needed. For nausea.   30 tablet  3    Allergies: Review of patient's allergies indicates no known allergies.  Past Medical History  Diagnosis Date  . Chronic pancreatitis   . Psoriasis     extensive, responds to strong  steroids, unable to start molecular therapies due to cost  . Depression   . Hyperlipidemia   . Heme positive stool     neg colonoscopies 11/2008 and 01/2008  . Duodenitis     by EGD 1/07  . RUE weakness     2/2 cervical spondylosis  . Emphysema     unclear diagnosis noted in previous EMR  . Smoking   . History of alcohol abuse    Past Surgical History  Procedure Date  . Tympanoplasty    Family History  Problem Relation Age of Onset  . Diabetes Mother   . Heart disease Father   . Cancer Father     ?kidney or liver cancer   History   Social History  . Marital Status: Legally Separated   Occupational History  . On disability, pipe fitter in the past   Social History Main Topics  . Smoking status: Current Everyday  Smoker -- 1.0 packs/day for 15 years    Types: Cigarettes  . Smokeless tobacco: Not on file   Comment: PATIENT HAS CUT DOWN ON THE AMOUNT HE SMOKES  . Alcohol Use: No     quit about 4 years ago  . Drug Use: Yes    Special: Marijuana   Social History Narrative   [facility] assistance [facility] and [facility]  March 08, 2010 2:17 PM    Review of Systems: General: no fevers, changes in weight, changes in appetite Skin: no rash HEENT: no blurry vision, hearing changes, sore throat Pulm: no dyspnea, chronic cough, no wheezing CV: no chest pain, palpitations, shortness of breath GU: no dysuria, hematuria, polyuria Ext: no arthralgias, myalgias Neuro: no weakness, numbness, or tingling  Physical Exam: Vitals: T:97.9   HR:97   BP:104/64   RR:18   O2 saturation: 96% RA General: resting in bed HEENT: PERRL, EOMI, no scleral icterus, no conjunctival pallor or injection Cardiac: RRR, no rubs, murmurs or gallops Pulm: clear to auscultation bilaterally, moving normal volumes of air Abd: soft, nondistended, BS normoactive, tender to palpation of epigastric area Ext: warm and well perfused, no pedal edema Neuro: alert and oriented X3, cranial nerves II-XII grossly intact, strength and sensation to light touch equal in bilateral upper and lower extremities  Lab results: Basic Metabolic Panel:  Basename 03/09/11 1811  NA 135  K 4.2  CL 100  CO2 19  GLUCOSE 75  BUN 11  CREATININE 0.65  CALCIUM 9.6  MG --  PHOS --   Liver Function Tests:  Basename 03/09/11 1811  AST 18  ALT 8  ALKPHOS 100  BILITOT 0.3  PROT 7.8  ALBUMIN 4.1    Basename 03/09/11 1811  LIPASE 29  AMYLASE --   CBC:  Basename 03/09/11 1811  WBC 11.0*  NEUTROABS 6.9  HGB 15.6  HCT 44.2  MCV 87.5  PLT 234   Imaging results:  CT Abd/Pelvis with contrast (November 23, 2010):     Findings: Linear scarring in the right lower lobe and bullous   formation in the right  middle lobe appear stable.  The liver   enhances with no focal abnormality and no ductal dilatation is   seen.  The gallbladder is contracted and no calcified gallstones   are seen.  There is calcification within the head of the pancreas   in this patient with history of chronic pancreatitis.  The more   distal pancreas is unremarkable and the pancreatic duct is not  significantly dilated.  The adrenal glands and spleen are   unremarkable.  The stomach is moderately fluid distended with   contrast and air.  However there is a rounded filling defect within   the distal antrum of the stomach which may extend in the to the   duodenum.  Although this could represent retained food debris, the   attenuation is very similar to adjacent hepatic and splenic   parenchyma, and a soft tissue mass cannot be excluded.  If further   assessment is warranted endoscopy would be recommended.  The   kidneys enhance with no calculus or mass and no hydronephrosis is   seen.  The abdominal aorta is normal in caliber with moderate   atheromatous change.    The appendix appears normal within the right lower quadrant,   filling with air.  The terminal ileum also is unremarkable.  The   urinary bladder is moderately urine distended and unremarkable.   The prostate is normal in size.  No pelvic mass or fluid is seen.   Degenerative disc disease is present at the L5-S1 level.    IMPRESSION:    1.  Rounded soft tissue mass-like opacity within the distal antrum   of the stomach possibly extending into the duodenum suspicious for   soft tissue mass.  Correlate clinically and consider endoscopy to   assess further if warranted.   2.  Changes of chronic calcific pancreatitis.  Assessment & Plan by Problem: 50 y/o m with pmh of chronic pancreatitis from alcoholism with recurrent acute episodes and hospital admissions presents with acute epigastric pain   1. Abdominal pain: Most likely acute pancreatitis given  symptoms similar to previous episodes.  - Bowel rest, Iv dilaudid Q2 prn, may change to pca if requiring more pain medications.  - IVF at 200 cc/hr  - May restart pancrease once taking po. Patient has not been taking these for over a year due to cost but has got medicaid now and will be able to afford it outpatient.  - no abdominal imaging needed at this time as had CT abd/pelvis in 6/12  - check UDS and blood alcohol level.   2. Hypotension- probably secondary to volume loss from diarrhea, vomiting and poor po intake.  There is a mention of adrenal insufficiency in his records, will check random cortisol.  - IV fluids at 200 cc/hr, caution with dilaudid.   3. Smoking: SW consult   4. Tarry stool: check FOBT. had colonoscopy before which showed hemorrhoids and EGD which showed gastritis. Will give iv ppi   5. DVT Px- lovenox  R3______________________________ (Dr. Scot Dock, [phone])   R1________________________________ (Dr. Milbert Coulter, [phone])  ATTENDING: I performed and/or observed a history and physical examination of the patient.  I discussed the case with the residents as noted and reviewed the residents' notes.  I agree with the findings and plan--please refer to the attending physician note for more details.  Signature________________________________  Printed Name_____________________________

## 2011-03-10 LAB — CBC
MCH: 30.7 pg (ref 26.0–34.0)
MCHC: 34.1 g/dL (ref 30.0–36.0)
MCV: 90.2 fL (ref 78.0–100.0)
Platelets: 210 10*3/uL (ref 150–400)
RDW: 13.6 % (ref 11.5–15.5)
WBC: 8.5 10*3/uL (ref 4.0–10.5)

## 2011-03-10 LAB — BASIC METABOLIC PANEL
Chloride: 106 mEq/L (ref 96–112)
Creatinine, Ser: 0.67 mg/dL (ref 0.50–1.35)
GFR calc Af Amer: >60 mL/min (ref >60)
Potassium: 4 mEq/L (ref 3.5–5.1)
Sodium: 138 mEq/L (ref 135–145)

## 2011-03-10 LAB — CORTISOL: Cortisol, Plasma: 1.3 ug/dL

## 2011-03-13 NOTE — Discharge Summary (Signed)
Pt seen for abdominal pain/ran out of pain meds. Will be put on pain contract and get to pain management clinic. Pt current dose is updated in meds, ms contin 60 mg BID, percocet 7.5/325 mg 3-4 per day. Sign pain contract with him and work on pain management clinic. Dr. Phillips Odor saw him and is okay with this dose, we are restarting pancrease which he has been off of for 1 yr, now has medicaid and can afford it. Hopefully will lessen pain medication burden.

## 2011-03-14 ENCOUNTER — Encounter: Payer: Self-pay | Admitting: Internal Medicine

## 2011-03-14 ENCOUNTER — Ambulatory Visit (INDEPENDENT_AMBULATORY_CARE_PROVIDER_SITE_OTHER): Payer: Medicaid Other | Admitting: Internal Medicine

## 2011-03-14 VITALS — BP 127/82 | HR 130 | Temp 97.1°F | Ht 68.0 in | Wt 135.8 lb

## 2011-03-14 DIAGNOSIS — K861 Other chronic pancreatitis: Secondary | ICD-10-CM

## 2011-03-14 MED ORDER — MORPHINE SULFATE CR 60 MG PO TB12: 60.0000 mg | ORAL_TABLET | Freq: Two times a day (BID) | ORAL | Status: DC

## 2011-03-14 MED ORDER — PANCRELIPASE (LIP-PROT-AMYL) 4200 UNITS PO CPEP: 1.0000 | ORAL_CAPSULE | Freq: Three times a day (TID) | ORAL | Status: DC

## 2011-03-14 MED ORDER — OXYCODONE-ACETAMINOPHEN 7.5-325 MG PO TABS: 1.0000 | ORAL_TABLET | ORAL | Status: DC | PRN

## 2011-03-14 NOTE — Patient Instructions (Signed)
We will get the referral to the pain management clinic.  If you haven't had an appointment with them before the medication runs out please call the clinic to be seen.  Follow up in about 3 weeks to see how things are going at the new pain clinic and how the enzymes are working.

## 2011-03-14 NOTE — Progress Notes (Signed)
Subjective:   Patient ID: [name] male   DOB: 1961-04-24 50 y.o.   MRN: [id number]  HPI: [name] is a 50 y.o. man who presents to clinic today for hospital follow up.  He has a PMH of chronic pancreatitis and has been on a medication contract.  On 8/30 his mother had come to pick up his prescription and Dr. Phillips Odor was concerned because his UDS had hydrocodone and very little oxycodone and metabolites in it.  It was discussed that he was in violation of his contract and that he was to be given a tapering dose of opiates.  He subsequently ran out of his medication and was admitted for pain control.  During his hospitalization his UDS and contract were discussed and it was decided to refer him to a pain clinic at this visit.  He states that he isn't sure why his contract was violated.  He does state that has been taking his mother Vicodin because of an abscessed tooth.  He was previously on MS contin 60 mg BID and Percocet 7.5/325 every 4 hours for his pain.    Past Medical History  Diagnosis Date  . Chronic pancreatitis   . Psoriasis   . Chronic pancreatitis   . Psoriasis     extensive, responds to strong steroids, unable to start molecular therapies due to cost  . Depression   . Hyperlipidemia   . Heme positive stool     neg colonoscopies 11/2008 and 01/2008  . Duodenitis     by EGD 1/07  . RUE weakness     2/2 cervical spondylosis  . Emphysema     unclear diagnosis noted in previous EMR  . Smoking   . History of alcohol abuse    Current Outpatient Prescriptions  Medication Sig Dispense Refill  . amitriptyline (ELAVIL) 25 MG tablet Take 2 tablets (50 mg total) by mouth at bedtime as needed. For pain.   60 tablet  5  . amylase-lipase-protease (PANGESTYME MT) 48-16-48 MU per capsule Take 5-6 capsules by mouth. Before each meal.       . morphine (MS CONTIN) 60 MG 12 hr tablet Take 1 tablet (60 mg total) by mouth 2 (two) times daily. May fill 01/23/11  60 tablet  0  .  Multiple Vitamin (MULTIVITAMIN) tablet Take 1 tablet by mouth daily.        Marland Kitchen oxyCODONE-acetaminophen (PERCOCET) 7.5-325 MG per tablet 1 tablet every 4 (four) hours as needed. Dr. Phillips Odor preascribed taper, now using up to 3 per day. I gave #20 for 5 days until clinic visit. This was the taper he was supposed to do: Use 1 tablet every 4 hours for 1 week, 1 tablet every 8 hours for 1 week, then 1 tablet every 12 hours for 1 week, then 1 tablet daily for 1 week then stop.        Family History  Problem Relation Age of Onset  . Diabetes Mother   . Heart disease Father   . Cancer Father     ?kidney or liver cancer   History   Social History  . Marital Status: Legally Separated    Spouse Name: N/A    Number of Children: N/A  . Years of Education: N/A   Social History Main Topics  . Smoking status: Current Everyday Smoker -- 1.0 packs/day for 15 years    Types: Cigarettes  . Smokeless tobacco: None   Comment: PATIENT HAS CUT DOWN ON THE AMOUNT HE  SMOKES  . Alcohol Use: No     quit about 2-3 years ago  . Drug Use: Yes    Special: Marijuana  . Sexually Active: None     trying for disability to gain medicare coverage, has pain contract and warned that if tested positive again for cocaine will not be given narcotics   Other Topics Concern  . None   Social History Narrative   Financial assistance approved for 100% discount at [facility] and has [facility] card[facility]  March 08, 2010 2:17 PM   Review of Systems: Negative except for as noted in the HPI.    Objective:  Physical Exam: Filed Vitals:   03/14/11 1118  BP: 127/82  Pulse: 130  Temp: 97.1 F (36.2 C)  TempSrc: Oral  Height: 5\' 8"  (1.727 m)  Weight: 135 lb 12.8 oz (61.598 kg)   Constitutional: Vital signs reviewed.  Patient is a thin, well developed man in no acute distress and cooperative with exam. Alert and oriented x3.  Head: Normocephalic and atraumatic Ear: TM normal bilaterally Mouth: no erythema or exudates,  MMM Eyes: PERRL, EOMI, conjunctivae normal, No scleral icterus.  Neck: Supple, Trachea midline normal ROM, No JVD, mass, thyromegaly, or carotid bruit present.  Cardiovascular: RRR, S1 normal, S2 normal, no MRG, pulses symmetric and intact bilaterally Pulmonary/Chest: CTAB, no wheezes, rales, or rhonchi Abdominal: Soft. Mild diffuse tenderness, non-distended, bowel sounds are normal, no masses, organomegaly, or guarding present.  GU: no CVA tenderness Musculoskeletal: No joint deformities, erythema, or stiffness, ROM full and no nontender Hematology: no cervical, inginal, or axillary adenopathy.  Neurological: A&O x3, Strenght is normal and symmetric bilaterally, cranial nerve II-XII are grossly intact, no focal motor deficit, sensory intact to light touch bilaterally.  Skin: Warm, dry and intact. No rash, cyanosis, or clubbing.  Psychiatric: Normal mood and affect. speech and behavior is normal. Judgment and thought content normal. Cognition and memory are normal.   Assessment & Plan:

## 2011-03-15 LAB — DIFFERENTIAL
Basophils Relative: 1
Basophils Relative: 0
Eosinophils Absolute: 0.3
Eosinophils Absolute: 0.1
Eosinophils Relative: 2
Lymphs Abs: 2.6
Monocytes Absolute: 0.6
Monocytes Absolute: 0.4
Monocytes Relative: 5
Monocytes Relative: 3
Neutrophils Relative %: 69
Neutrophils Relative %: 73

## 2011-03-15 LAB — CBC
HCT: 38.7 — ABNORMAL LOW
Hemoglobin: 13.5
Hemoglobin: 14
Hemoglobin: 14.9
Hemoglobin: 14.8
MCHC: 34.7
MCHC: 34
MCV: 97.7
Platelets: 219
Platelets: 220
RBC: 4.11 — ABNORMAL LOW
RBC: 4.14 — ABNORMAL LOW
RBC: 4.45
RDW: 12.7
RDW: 12.4
WBC: 7.2
WBC: 7.7
WBC: 7.8

## 2011-03-15 LAB — LACTIC ACID, PLASMA: Lactic Acid, Venous: 1

## 2011-03-15 LAB — CARDIAC PANEL(CRET KIN+CKTOT+MB+TROPI)
CK, MB: 1
Relative Index: INVALID
Relative Index: INVALID
Total CK: 71
Troponin I: 0.03
Troponin I: <0.01

## 2011-03-15 LAB — URINE CULTURE
Colony Count: NO GROWTH
Culture: NO GROWTH

## 2011-03-15 LAB — RAPID URINE DRUG SCREEN, HOSP PERFORMED
Amphetamines: NOT DETECTED
Barbiturates: NOT DETECTED
Benzodiazepines: POSITIVE — AB
Tetrahydrocannabinol: NOT DETECTED

## 2011-03-15 LAB — URINALYSIS, ROUTINE W REFLEX MICROSCOPIC
Glucose, UA: NEGATIVE
Glucose, UA: NEGATIVE
Hgb urine dipstick: NEGATIVE
Hgb urine dipstick: NEGATIVE
Ketones, ur: NEGATIVE
Protein, ur: NEGATIVE
Protein, ur: NEGATIVE
pH: 5.5
pH: 5.5

## 2011-03-15 LAB — DRUGS OF ABUSE SCREEN W/O ALC, ROUTINE URINE
Benzodiazepines.: POSITIVE — AB
Cocaine Metabolites: NEGATIVE
Methadone: NEGATIVE
Opiate Screen, Urine: POSITIVE — AB
Phencyclidine (PCP): NEGATIVE

## 2011-03-15 LAB — BASIC METABOLIC PANEL
CO2: 26
CO2: 27
Calcium: 8.6
Calcium: 9.2
Chloride: 103
Chloride: 110
Creatinine, Ser: 0.77
GFR calc Af Amer: >60
GFR calc Af Amer: >60
GFR calc Af Amer: >60
GFR calc non Af Amer: >60
Potassium: 3.4 — ABNORMAL LOW
Potassium: 3.9
Sodium: 138
Sodium: 138

## 2011-03-15 LAB — COMPREHENSIVE METABOLIC PANEL
ALT: 12
ALT: 11
AST: 20
Albumin: 3.7
Albumin: 4.2
Alkaline Phosphatase: 64
Alkaline Phosphatase: 78
Calcium: 9.4
GFR calc Af Amer: >60
Glucose, Bld: 101 — ABNORMAL HIGH
Glucose, Bld: 122 — ABNORMAL HIGH
Potassium: 4
Potassium: 3.9
Sodium: 137
Sodium: 138
Total Protein: 7
Total Protein: 7.4

## 2011-03-15 LAB — BENZODIAZEPINE, QUANTITATIVE, URINE
Alprazolam (GC/LC/MS), ur confirm: 300 ng/mL
Flurazepam GC/MS Conf: NEGATIVE
Nordiazepam GC/MS Conf: NEGATIVE

## 2011-03-15 LAB — PROPOXYPHENE, CONFIRMATION: Propoxyphene or Metab. GC/MS: 2300 ng/mL

## 2011-03-15 LAB — MAGNESIUM
Magnesium: 1.8
Magnesium: 2

## 2011-03-15 LAB — B-NATRIURETIC PEPTIDE (CONVERTED LAB): Pro B Natriuretic peptide (BNP): <30

## 2011-03-15 LAB — LIPID PANEL
Total CHOL/HDL Ratio: 5.1
VLDL: 73 — ABNORMAL HIGH

## 2011-03-15 LAB — OPIATE, QUANTITATIVE, URINE: Oxycodone, ur: NEGATIVE ng/mL

## 2011-03-19 LAB — COMPREHENSIVE METABOLIC PANEL
ALT: 12
AST: 17
Alkaline Phosphatase: 63
CO2: 21
Calcium: 9.2
GFR calc Af Amer: >60
Potassium: 4.1
Sodium: 137
Total Protein: 7.1

## 2011-03-19 LAB — DIFFERENTIAL
Basophils Relative: 0
Eosinophils Absolute: 0.2
Eosinophils Relative: 2
Lymphs Abs: 3
Monocytes Absolute: 0.6
Monocytes Relative: 5

## 2011-03-19 LAB — URINALYSIS, ROUTINE W REFLEX MICROSCOPIC
Glucose, UA: NEGATIVE
Hgb urine dipstick: NEGATIVE
Ketones, ur: NEGATIVE
pH: 5

## 2011-03-19 LAB — CBC
MCHC: 35.2
RBC: 3.41 — ABNORMAL LOW
RDW: 13.1

## 2011-03-20 LAB — COMPREHENSIVE METABOLIC PANEL
ALT: 12
BUN: 9
CO2: 22
CO2: 21
Calcium: 9.1
Calcium: 8.7
Creatinine, Ser: 0.72
GFR calc non Af Amer: >60
GFR calc non Af Amer: >60
Glucose, Bld: 96
Glucose, Bld: 96
Sodium: 138
Total Protein: 6.2

## 2011-03-20 LAB — DIFFERENTIAL
Basophils Relative: 1
Eosinophils Absolute: 0.3
Lymphocytes Relative: 17
Lymphs Abs: 3.2
Lymphs Abs: 2.7
Monocytes Relative: 3
Neutro Abs: 12.3 — ABNORMAL HIGH
Neutrophils Relative %: 54
Neutrophils Relative %: 78 — ABNORMAL HIGH

## 2011-03-20 LAB — CBC
HCT: 37.5 — ABNORMAL LOW
HCT: 34.8 — ABNORMAL LOW
Hemoglobin: 12.8 — ABNORMAL LOW
MCHC: 34.2
MCHC: 35.9
MCV: 96
MCV: 93.5
RBC: 3.91 — ABNORMAL LOW
RDW: 12.9
WBC: 15.7 — ABNORMAL HIGH

## 2011-03-20 LAB — URINALYSIS, ROUTINE W REFLEX MICROSCOPIC
Ketones, ur: NEGATIVE
Nitrite: NEGATIVE
Protein, ur: NEGATIVE
pH: 5.5

## 2011-03-20 LAB — LIPASE, BLOOD
Lipase: 23
Lipase: 27

## 2011-03-20 NOTE — Assessment & Plan Note (Signed)
He has been on pain medication for sometime due to his chronic pancreatitis but has some concern for misuse.  His last UDS opiate screen had hydrocodone on it which he was not prescribed and his levels of oxycodone indicate that he is not taking the amount that he states he is.  He was prescribed a tapering dose and then presented to the ER because of lack of pain control.  Because of his narcotic contract we are forced to stop his narcotics from our clinic and refer him to a pain clinic which he is agreeable to.  We will give him a supply of medication to make it to his appointment with the pain medicine clinic to avoid withdrawal but if he makes the appointment and misses it we will have to give him a taper off of his long acting medication until he is either seen in the pain medicine clinic or he is off opiate pain medication.

## 2011-03-22 LAB — CBC
Hemoglobin: 14
RBC: 4.43
RDW: 14.1
WBC: 14.2 — ABNORMAL HIGH

## 2011-03-22 LAB — DIFFERENTIAL
Basophils Relative: 1
Eosinophils Absolute: 0.2
Monocytes Absolute: 0.4
Monocytes Relative: 3
Neutrophils Relative %: 73

## 2011-03-22 LAB — URINALYSIS, ROUTINE W REFLEX MICROSCOPIC
Ketones, ur: NEGATIVE
Nitrite: NEGATIVE
Specific Gravity, Urine: 1.023
pH: 5.5

## 2011-03-22 LAB — COMPREHENSIVE METABOLIC PANEL
ALT: 12
Alkaline Phosphatase: 77
Chloride: 110
Glucose, Bld: 100 — ABNORMAL HIGH
Potassium: 3.8
Sodium: 139
Total Protein: 7.1

## 2011-03-25 ENCOUNTER — Telehealth: Payer: Self-pay | Admitting: *Deleted

## 2011-03-25 LAB — CBC
HCT: 34.7 — ABNORMAL LOW
HCT: 38.4 — ABNORMAL LOW
HCT: 38.5 — ABNORMAL LOW
Hemoglobin: 11.7 — ABNORMAL LOW
Hemoglobin: 12.9 — ABNORMAL LOW
MCHC: 34
MCHC: 34.1
MCHC: 34.7
MCV: 90.4
MCV: 91.1
Platelets: 233
Platelets: 285
RBC: 3.74 — ABNORMAL LOW
RBC: 4.19 — ABNORMAL LOW
RBC: 4.22
RDW: 15.2
RDW: 15.3
WBC: 6.3
WBC: 6.6

## 2011-03-25 LAB — MAGNESIUM: Magnesium: 2

## 2011-03-25 LAB — COMPREHENSIVE METABOLIC PANEL
ALT: 9
AST: 17
Albumin: 2.8 — ABNORMAL LOW
Albumin: 3.4 — ABNORMAL LOW
Alkaline Phosphatase: 61
Calcium: 8.4
Calcium: 9.4
Creatinine, Ser: 0.65
GFR calc Af Amer: >60
GFR calc Af Amer: >60
Potassium: 3.2 — ABNORMAL LOW
Sodium: 139
Total Protein: 5.4 — ABNORMAL LOW

## 2011-03-25 LAB — BASIC METABOLIC PANEL
BUN: 2 — ABNORMAL LOW
CO2: 28
Chloride: 107
Creatinine, Ser: 0.69
GFR calc Af Amer: >60
GFR calc Af Amer: >60
GFR calc non Af Amer: >60
GFR calc non Af Amer: >60
Glucose, Bld: 103 — ABNORMAL HIGH
Potassium: 3.4 — ABNORMAL LOW
Potassium: 3.7
Potassium: 3.9
Sodium: 139
Sodium: 139

## 2011-03-25 LAB — DIFFERENTIAL
Basophils Relative: 1
Eosinophils Absolute: 0.4
Eosinophils Relative: 1
Lymphocytes Relative: 24
Lymphs Abs: 2.5
Lymphs Abs: 2.6
Monocytes Absolute: 0.3
Monocytes Absolute: 0.4
Monocytes Relative: 5

## 2011-03-25 LAB — LIPID PANEL
HDL: 19 — ABNORMAL LOW
Total CHOL/HDL Ratio: 7.8
Triglycerides: 285 — ABNORMAL HIGH
VLDL: 57 — ABNORMAL HIGH

## 2011-03-25 LAB — URINALYSIS, ROUTINE W REFLEX MICROSCOPIC
Bilirubin Urine: NEGATIVE
Hgb urine dipstick: NEGATIVE
Nitrite: NEGATIVE
Specific Gravity, Urine: 1.031 — ABNORMAL HIGH
pH: 6

## 2011-03-25 LAB — LIPASE, BLOOD: Lipase: 34

## 2011-03-25 LAB — VITAMIN B12: Vitamin B-12: 396 (ref 211–911)

## 2011-03-25 NOTE — Telephone Encounter (Signed)
Aneta Mins calls from [facility] to get a PA done for pt on pancreatic med, ph# [phone]. Will try to do this pm if time permits. Sending to [place]

## 2011-03-26 LAB — IRON AND TIBC
Iron: 60
UIBC: 166

## 2011-03-26 LAB — CBC
HCT: 34.8 — ABNORMAL LOW
HCT: 35.7 — ABNORMAL LOW
HCT: 37.9 — ABNORMAL LOW
HCT: 39.5
Hemoglobin: 13.2
MCHC: 33.5
MCHC: 34.5
MCHC: 34.9
MCV: 89
MCV: 89.3
MCV: 91
Platelets: 235
RBC: 4 — ABNORMAL LOW
RBC: 4.16 — ABNORMAL LOW
RDW: 14.2
WBC: 7.3
WBC: 7.9
WBC: 8.1

## 2011-03-26 LAB — BASIC METABOLIC PANEL
BUN: 3 — ABNORMAL LOW
CO2: 21
CO2: 23
Chloride: 109
Chloride: 109
Chloride: 111
Creatinine, Ser: 0.59
Creatinine, Ser: 0.63
Creatinine, Ser: 0.64
GFR calc Af Amer: >60
GFR calc Af Amer: >60
GFR calc non Af Amer: >60
Glucose, Bld: 166 — ABNORMAL HIGH
Potassium: 3 — ABNORMAL LOW
Potassium: 3.1 — ABNORMAL LOW
Potassium: 3.8

## 2011-03-26 LAB — LIPASE, BLOOD: Lipase: 27

## 2011-03-26 LAB — CORTISOL
Cortisol, Plasma: 2.5
Cortisol, Plasma: 7

## 2011-03-26 LAB — URINALYSIS, ROUTINE W REFLEX MICROSCOPIC
Bilirubin Urine: NEGATIVE
Ketones, ur: NEGATIVE
Nitrite: NEGATIVE
Protein, ur: NEGATIVE
Urobilinogen, UA: 0.2

## 2011-03-26 LAB — RETICULOCYTES
RBC.: 4.32
Retic Count, Absolute: 43.2
Retic Ct Pct: 1

## 2011-03-26 LAB — DIFFERENTIAL
Basophils Absolute: 0
Basophils Relative: 0
Monocytes Relative: 4
Neutro Abs: 6.2
Neutrophils Relative %: 65

## 2011-03-26 LAB — COMPREHENSIVE METABOLIC PANEL
Alkaline Phosphatase: 75
BUN: 14
Calcium: 9.1
Glucose, Bld: 100 — ABNORMAL HIGH
Potassium: 3.9
Total Protein: 6.2

## 2011-03-26 LAB — PROTIME-INR
INR: 1
Prothrombin Time: 12.9

## 2011-03-26 LAB — VITAMIN B12: Vitamin B-12: 734 (ref 211–911)

## 2011-03-26 LAB — MAGNESIUM
Magnesium: 1.7
Magnesium: 1.9

## 2011-03-26 LAB — RAPID URINE DRUG SCREEN, HOSP PERFORMED
Barbiturates: NOT DETECTED
Benzodiazepines: POSITIVE — AB
Cocaine: NOT DETECTED

## 2011-03-26 LAB — ACTH STIMULATION, 3 TIME POINTS: Cortisol, 60 Min: 21.9 (ref >20)

## 2011-04-01 NOTE — Discharge Summary (Signed)
NAMESCOTTI, KOSTA NO.:  [id number]  MEDICAL RECORD NO.:  [id number]  LOCATION:  [facility]                         FACILITY:  [facility]  PHYSICIAN:  Dr. Phillips Odor            DATE OF BIRTH:  [birth year]  DATE OF ADMISSION:  03/09/2011 DATE OF DISCHARGE:  03/10/2011                              DISCHARGE SUMMARY   DISCHARGE DIAGNOSES: 1. Abdominal pain. 2. Hypotension. 3. Smoking abuse. 4. Tarry stools.  DISCHARGE MEDICATIONS: 1. Pancrease 42,000 units, take 16109 lipase units with meals at every     meal by mouth 3 times a day before meals. 2. Amitriptyline 25 mg 2 capsules by mouth daily at bedtime. 3. Multivitamin 1 tablet by mouth daily. 4. Morphine sulfate controlled release 60 mg one tablet by mouth twice     daily. I did prescribe 12. 5. Percocet 7.5/325 mg 1 tablet by mouth every 4 hours as needed, I     did prescribe 20.  DISPOSITION AND FOLLOWUP:  He will see Dr. Tonny Branch at clinic on March 14, 2011, at 11:15 a.m.  I will try to make a referral to [facility] with Dr. Vear Clock.  At this point, Dr. Tonny Branch should please talk to the patient about his medication contract and please sign his medication contract with him, please inform him he is only to go to one pharmacy.  UDS will be done.  Please be firm with him that he does need to go the pain clinic.  I will start the paper work. I will send Dr. Tonny Branch a message detailing what he needs to do to get this pain management referal done.  PROCEDURES PERFORMED:  None.  COMPLICATIONS:  None.  BRIEF ADMITTING HISTORY AND PHYSICAL:  This is a 50 year old man with past medical history of chronic pancreatitis, gallstones, nephrolithiasis, PUD, complains of abdominal pain, he attributes to pancreatitis, he notes the pain has been worsening for 5 days, so he came to the emergency department 5 days ago and got 20 pills which did manage his pain well but now he has run out of pills.  He states  the pain is constant, nonradiating,  unable to clarify quality, pain 10/10 at worse, 3/10 at best due to his chronic pancreatitis.  It was primarily controlled on his Pancrease, however for 1 year, he has not been on it due to financial constraints, however, he does have Medicaid now and so he will be able to start taking it again.  Currently an 8/10 pain after getting 5 mg of Dilaudid.  The last episode of acute pancreatitis that he knows was 1 year ago.  He has not eaten anything since the pain started.  He reports of cold sweats, no fevers.  He states he has been having dry heaves and he did have some tarry diarrhea 5 days ago.  No diarrhea for 4 days prior to admission.  No alcohol in 4 years per him.  He states that recently he was taken off his chronic pain regimen.  Medications not really compliant with medications. home medications are Elavil 25 mg 2 tablets at bedtime as needed.  His Pancrease which he has  been off for 1 year, morphine 60 mg 1 tablet by mouth twice daily, controlled release and multivitamin, he is supposed to be taking omeprazole 40 mg p.o. 2 times daily, Percocet 7.5/325 one every 4 hours with a taper, so 1 tablet every 4 hours for 1 week, 1 tablet every 8 hours for 1 week, then 1 tablet every 12 hours for 1 week, and then 1 tablet daily for 1 week and then stop.  He also was supposed to be taking Phenergan 12.5 mg 1 tablet by mouth every 4 hours as needed for nausea.  ALLERGIES:  No known drug allergies.  PAST MEDICAL HISTORY:  Chronic pancreatitis supposedly some psoriasis, depression, hyperlipidemia, emphysema, smoking, history of alcohol abuse, so alcohol use is current.  PHYSICAL EXAMINATION:  VITAL SIGNS:  Temperature 97.9, heart rate 97, blood pressure 104/64, respirations 18, O2 saturation 96% on room air. GENERAL:  Resting in bed. HEENT:  Pupils equal, round, reactive to light and accommodation. Extraocular motions intact.  No scleral icterus.  No  conjunctival pallor or injection. CARDIAC:  Regular rate and rhythm.  No rubs, murmurs, or gallops. PULMONARY:  Moving good amounts of air, clear to auscultation bilaterally. ABDOMEN:  Soft and nondistended.  Bowel sounds are normoactive, tender to palpation at the epigastric area. EXTREMITIES:  Warm, well-perfused.  No pedal edema. NEUROLOGICAL:  Awake and alert x3.  Cranial nerves II through XII are intact, equal strength.  Sensation to light touch in bilateral upper and lower extremities.  LABORATORY RESULTS:  Sodium 135, potassium 4.2, chloride 100, bicarb 19, BUN 11, creatinine 0.65, glucose 75, calcium 9.6.  Liver function test AST 18, ALT 8, alk phos 100, total bilirubin 0.3, protein 7.8, albumin 4.2, lipase was 29.  CBC, white count 11, hemoglobin 16.6, and platelets 234.  Imaging results, shows a CT of abdomen and pelvis that did show rounded soft tissue mass like opacity within distal antrum of the stomach possibly turning into duodenum suspicious for soft tissue mass and changes of chronic calcific pancreatitis.  HOSPITAL COURSE: 1. Abdominal pain, most likely acute pancreatitis.  Possibly pain     seeking medication behavior.  We did get bowel rest, IV Dilaudid,     and we did start IV fluids.  We did send him home with Pancreas.     Once he was taking p.o., we did switch him over to p.o. in the next     day or so, his pain was resolved well and was able to take p.o. No     abdominal imaging was needed because he did just have the CT     abdomen.  We did check UDS and blood alcohol level.  During this     hospitalization, his pain did resolve significantly fairly quickly,     once he was getting his pain medications and we did restart his     pain medications on discharge with a plan that he will be seen in     our clinic, and he will be on very strict pain contract and he is     open to this.  We did talk to him about seeing a pain clinic and he     is also open to  this possibility, so we will refer him to the     pain management. Hopefully once he gets started on Pancrease again he will need less and less of pain medication chronically.     The patient was stable at the time of  discharge. 2. Hypotension was secondary to volume loss.  We did check a random     cortisol, which was normal.  We did start IV fluids and hold     parameters, he did become normotensive throughout this     administration and did not require any further intervention.  Was     stable at the time of discharge. 3. Tobacco abuse.  We did actually counsel him that he should stop     smoking.  He does not wish to quit at this time.  He was stable at     the time of discharge. 4. Tarry stools.  We did check FOBT which was positive.  We gave IV     PPI during this hospitalization.  He has had colonoscopy in the     past which showed hemorrhoids and EGD which showed gastritis,     probably exacerbated by his alcohol drinking at home, and we did     advise him to be off the alcohol however, if this is a consistent     problem in the future it may warrant outpatient evaluation. 5. Alcohol abuse.  We did strongly encourage him to stop drinking at     home.  However, we will see if this happens.  The patient was stable at the time of discharge, not requiring any sedation and did     not have any withdrawal symptoms.  DISCHARGE LABS AND VITALS:  Vitals on the day of discharge, temperature 97.7, pulse 84, respirations 18, blood pressure 116/74, satting 96% on room air.  Other laboratory results during this admission;  random cortisol was 1.3.  Basic metabolic sodium of 138, potassium 4, chloride 106, bicarb 18, BUN 14, creatinine 0.67, glucose 51, calcium 8.2, amylase was 40 which was normal and alcohol level was less than 11 which was undetected.  CBC:  White count 8.5, hemoglobin 12.5, and a platelet count 210.  There were no other imaging results during this admission. At this point,  the patient was stable for discharge.  He will be seen in clinic in followup this Thursday with Dr. Tonny Branch and we will start the referral process for pain management clinic.    ______________________________ Genella Mech, MD   ______________________________ Dr. Phillips Odor    EK/MEDQ  D:  03/11/2011  T:  03/11/2011  Job:  [id number]  cc:   Outpatient Clinic  Electronically Signed by Genella Mech MD on 03/13/2011 82:95:62 AM Electronically Signed by Anderson Malta D.O. on 04/01/2011 04:55:47 PM

## 2011-04-02 ENCOUNTER — Telehealth: Payer: Self-pay | Admitting: *Deleted

## 2011-04-02 ENCOUNTER — Other Ambulatory Visit: Payer: Self-pay | Admitting: *Deleted

## 2011-04-02 DIAGNOSIS — K861 Other chronic pancreatitis: Secondary | ICD-10-CM

## 2011-04-02 LAB — COMPREHENSIVE METABOLIC PANEL
ALT: 16
AST: 25
CO2: 21
Calcium: 9.2
Creatinine, Ser: 0.73
GFR calc Af Amer: >60
GFR calc non Af Amer: >60
Glucose, Bld: 106 — ABNORMAL HIGH
Sodium: 139
Total Protein: 7

## 2011-04-02 LAB — DIFFERENTIAL
Lymphocytes Relative: 32
Lymphs Abs: 3
Monocytes Relative: 5
Neutrophils Relative %: 61

## 2011-04-02 LAB — CBC
MCHC: 34.5
MCV: 97.4
RBC: 4.26
RDW: 13.5

## 2011-04-02 LAB — LIPASE, BLOOD: Lipase: 44

## 2011-04-02 NOTE — Telephone Encounter (Signed)
I'm okay with the 5000U enteric coated capsules 1 with each meal daily.  I will forward this to Dr. Yaakov Guthrie and if he agrees he can change it since this is his patient.

## 2011-04-02 NOTE — Telephone Encounter (Signed)
Pancreaze 4200U is on the non-preferred list. Creon and Pancrelipase are the only two on the preferred list.  Pharmacy unable to locate the generic pancrelipase 4200U.  The 4200U sold under brand name Pancreaze only according to the pharmacy.  He was able to find the generic pancrelipase 5000U if MD would like to change to it.  Will forward message to both ordering Dr Tonny Branch and PCP Dr Yaakov Guthrie for review.  If pt is to stay on the 4200U dose one? capsule TID (please confirm dose). Then MD will need to complete a prior authorization form.  Pt would like to get medication today. Please advise.Samuel Spittle Cassady10/9/20129:59 AM

## 2011-04-02 NOTE — Telephone Encounter (Signed)
Received fax from pharmacy stating pt's insurance will not pay for Pancreaze 4200U #900 because it's on the

## 2011-04-03 ENCOUNTER — Telehealth: Payer: Self-pay | Admitting: *Deleted

## 2011-04-03 DIAGNOSIS — K861 Other chronic pancreatitis: Secondary | ICD-10-CM

## 2011-04-03 MED ORDER — PANCRELIPASE (LIP-PROT-AMYL) 5000 UNITS PO CPEP: 1.0000 | ORAL_CAPSULE | Freq: Three times a day (TID) | ORAL | Status: DC

## 2011-04-03 MED ORDER — PANCRELIPASE (LIP-PROT-AMYL) 6000-19000 UNITS PO CPEP: 1.0000 | ORAL_CAPSULE | Freq: Three times a day (TID) | ORAL | Status: DC

## 2011-04-03 NOTE — Telephone Encounter (Signed)
Spoke to Dr Phillips Odor (who was attending when opioids were restarted) who would like pt to have monthly appts with PCP to receive controlled substances. I continue to refuse to prescribe controlled substances myself at this time, therefore pt will have to go without until Monday's appt as that is the earliest appt available. We will need a special contract that he must come every month to an appt and will not phone in for refills.   Will request that front desk make an appt with Dr Denton Meek on Monday and monthly appt with PCP thereafter.

## 2011-04-03 NOTE — Telephone Encounter (Signed)
Prescription for Enzymes fixed.

## 2011-04-03 NOTE — Telephone Encounter (Signed)
i have spoken to pt's mother, she is not happy with the message but did not argue greatly, she stated that it was going to be hard for her to get pt here on all the scheduled appts through jan because she is sure she will have to work on those days. i suggested medicaid transportation or pt coming to work w/ her and sitting in the lobby at the hospital and reading or if weather permits sitting outside or finding a friend to help out, she states it would be inconvenient for him to have to do that, i informed her these were the only dates that were available for pt and i was sorry but maybe if pt starts now he could find a route more convenient for him to be here those dates. i had also spoken to dr Tonny Branch and changed his panreatic meds to 5000 from 6000, i relayed this to phillip at the pharmacy

## 2011-04-03 NOTE — Telephone Encounter (Signed)
With active FYI of no controlled substances from the [facility] I am uncomfortable prescribing controlled substances as I do not know the pt and have not been involved in his care. I have reviewed the D.C summary and last office note and opioids were prescribed on both occassions for chronic pancreatitis with the intention of transferring opioid prescribing to pain control. However I checked with Debbie and pain mgmt denied the pt. There is a possibility that another pain mgmt center might accept the patient but I am concerned that this is unlikely to happen, esp in a timely manner. Therefore, I personally will not prescribe controlled substances but will forward to other providers involved in his care who can individually decide whether to take the responsibility for prescribing opioids, in which could turn out to be an long term situation.

## 2011-04-03 NOTE — Telephone Encounter (Signed)
Pt's mother calls and ask for narc scripts, she was informed of the answer to the request and it was explained that at last appt pt was to taper off, she states he became ill because of the taper she took him to the hosp and was told by the dr's there that "you all can't take his medicine away from him because it would make him sick every time" she states that she is tired of pt not getting what he needs, like his panrelipase meds and his pain clinic appt. Now he is being refused his pain meds and " he will be in the hospital" She would like these script written today and she will pick them up

## 2011-04-03 NOTE — Telephone Encounter (Signed)
I wrote him a script at his last appointment that should last him 20 days which I figured would be long enough to get him in to see a pain clinic doctor.  If he doesn't have an appointment the arrangement was to prescribe him medication until he gets his appointment at which point we would stop prescribing them.  Please check to see if he has his appointment for his pain clinic.  If he has an appointment then we can prescribe him enough to make it to that appointment.  As for his lipase enzymes I have sent a note to his PCP about that.  I'm okay with using the Creon 5000U one tablet.    I can send through the Creon 5000U for him but the narcotics will have to be written by the attending once it has been confirmed he has an appointment with the pain clinic.

## 2011-04-04 ENCOUNTER — Other Ambulatory Visit: Payer: Self-pay | Admitting: *Deleted

## 2011-04-04 MED ORDER — OMEPRAZOLE 40 MG PO CPDR: 40.0000 mg | DELAYED_RELEASE_CAPSULE | Freq: Two times a day (BID) | ORAL | Status: DC

## 2011-04-04 NOTE — Telephone Encounter (Signed)
Refilled pts Omeprazole with 1 refill. Explained to pt he needs to be seen in the office for any further refills Pt stated he has been without insurance and just have gotten Medicaid. Will call back to schedule an appointment

## 2011-04-08 ENCOUNTER — Encounter: Payer: Self-pay | Admitting: Internal Medicine

## 2011-04-08 ENCOUNTER — Ambulatory Visit (INDEPENDENT_AMBULATORY_CARE_PROVIDER_SITE_OTHER): Payer: Medicaid Other | Admitting: Internal Medicine

## 2011-04-08 VITALS — BP 130/86 | HR 106 | Temp 97.3°F | Ht 68.0 in | Wt 135.6 lb

## 2011-04-08 DIAGNOSIS — K861 Other chronic pancreatitis: Secondary | ICD-10-CM

## 2011-04-08 DIAGNOSIS — K860 Alcohol-induced chronic pancreatitis: Secondary | ICD-10-CM

## 2011-04-08 NOTE — Progress Notes (Signed)
  Subjective:    Patient ID: [name], male    DOB: 09-11-1960, 50 y.o.   MRN: [id number]  HPI  1. Patient is here "to get his vicodin." Hx of alcoholic pancreatitis. States that has been sober for 4 years. Reports chroic abdominal pain 9/10 in intensity and constant.  Patient states 'that nothing relieves his pain except Vicodin or percocet."  Patient was referred to pain clinic ->states that "they never called" him. States that his last Vicodin dose administration was on 04/05/11. Patient seems to be irritated by the questions asked both by myself and Dr. Carie Caddy.  Review of Systems  Constitutional: Positive for fatigue. Negative for fever, chills, diaphoresis, activity change, appetite change and unexpected weight change.  Eyes: Negative for visual disturbance.  Respiratory: Negative for cough, chest tightness, shortness of breath and wheezing.   Cardiovascular: Negative for chest pain, palpitations and leg swelling.  Gastrointestinal: Positive for abdominal pain. Negative for nausea, vomiting, diarrhea, constipation, blood in stool, abdominal distention and anal bleeding.  Genitourinary: Negative for dysuria, urgency, hematuria, flank pain, decreased urine volume, discharge, penile swelling, enuresis, difficulty urinating, genital sores, penile pain and testicular pain.  Musculoskeletal: Negative.   Neurological: Negative.  Negative for dizziness, tremors, seizures, syncope, weakness, light-headedness, numbness and headaches.  Hematological: Negative.   Psychiatric/Behavioral: Negative for suicidal ideas, hallucinations, behavioral problems, confusion, sleep disturbance, self-injury, dysphoric mood, decreased concentration and agitation. The patient is not nervous/anxious and is not hyperactive.    .ros    Objective:   Physical Exam Vitals: reviewed General: alert, well-developed, and cooperative to examination.  Head: normocephalic and atraumatic.  Eyes: vision grossly intact,  pupils equal, pupils round, pupils reactive to light, no injection and anicteric.  Mouth: pharynx pink and moist, no erythema, and no exudates.  Neck: supple, full ROM, no thyromegaly, no JVD, and no carotid bruits.  Lungs: normal respiratory effort, no accessory muscle use, normal breath sounds, no crackles, and no wheezes. Heart: normal rate, regular rhythm, no murmur, no gallop, and no rub.  Abdomen: soft, non-tender, normal bowel sounds, no distention, no guarding, no rebound tenderness, no hepatomegaly, and no splenomegaly.  Msk: no joint swelling, no joint warmth, and no redness over joints.  Pulses: 2+ DP/PT pulses bilaterally Extremities: No cyanosis, clubbing, edema Neurologic: alert & oriented X3, cranial nerves II-XII intact, strength normal in all extremities, sensation intact to light touch, and gait normal.  Skin: turgor normal and no rashes.  Psych: Oriented X3, memory intact for recent and remote, normally interactive, good eye contact, not anxious appearing, and not depressed appearing.          Assessment & Plan:  1. Chronic alcoholic pancreatitis.  - no objective findings of acute flare. -Reviewed patient's records: Hx of narcotic abuse and inconsistent UDS tests. Spoke with Dr. Rogelia Boga and Dr. Coralee Pesa. - Will NOT  Refill "Morphine, percocet and/or Vicodin" per patient's request despite his statements "that he will go into withdrawal and end up in the emergency room." - He was also re-referred to a different pain management clinic to reevaluate of his pain management needs. (1st pain clinic refused to accept  the patient).  - Patient was assured that he would get the best care possible if needed whether in ED, Urgent care or our clinic. Also reiterated that our clinic would always be available for his health care needs with an exception of Opioids prescriptions.

## 2011-04-08 NOTE — Patient Instructions (Signed)
Please, note that our clinic WILL NOT be prescribing pain medication. You are welcome to call pain management clinics on your own. Our clinic will be happy to take care of your other health care needs. Please, call with any questions.

## 2011-04-08 NOTE — Progress Notes (Signed)
  Subjective:    Patient ID: [name], male    DOB: 06/19/1961, 50 y.o.   MRN: [id number]  HPI    Review of Systems     Objective:   Physical Exam        Assessment & Plan:

## 2011-04-09 ENCOUNTER — Telehealth: Payer: Self-pay | Admitting: *Deleted

## 2011-04-09 LAB — DIFFERENTIAL
Basophils Absolute: 0
Basophils Relative: 0
Eosinophils Absolute: 0.2
Eosinophils Relative: 2
Lymphs Abs: 2.6
Monocytes Absolute: 0.4
Monocytes Relative: 6
Neutro Abs: 6
Neutrophils Relative %: 70

## 2011-04-09 LAB — CBC
HCT: 48.6
Hemoglobin: 16.8
MCHC: 34.9
MCV: 95.9
RBC: 4.62
WBC: 11.2 — ABNORMAL HIGH
WBC: 8.5

## 2011-04-09 LAB — COMPREHENSIVE METABOLIC PANEL
ALT: 16
AST: 20
Alkaline Phosphatase: 85
BUN: 11
CO2: 22
CO2: 23
Calcium: 9.6
Chloride: 100
Creatinine, Ser: 0.81
GFR calc Af Amer: >60
GFR calc non Af Amer: >60
GFR calc non Af Amer: >60
Glucose, Bld: 109 — ABNORMAL HIGH
Potassium: 3.9
Potassium: 4
Sodium: 141
Total Bilirubin: 0.7
Total Protein: 7.1

## 2011-04-09 LAB — LIPASE, BLOOD: Lipase: 19

## 2011-04-09 NOTE — Telephone Encounter (Signed)
Pt's mother called and wants to know why pt was taken off all pain meds.  Also questions why he was told he needed to find a pain clinic on his own. I talked with Dr Denton Meek to verified that all pain meds were stopped.  I asked pt's mother to talk with pt about the reasons his meds were stopped, as it was explained to him.  Also I told her we were working on getting him into a pain clinic. She seemed satisfied with the conversation and asked if we had tried [facility] - pain clinic.  I will pass this on to nurse working on referral.

## 2011-04-11 ENCOUNTER — Encounter: Payer: Medicaid Other | Admitting: Internal Medicine

## 2011-04-17 NOTE — Progress Notes (Signed)
Addended by: Neomia Dear on: 04/17/2011 06:48 PM   Modules accepted: Orders

## 2011-04-17 NOTE — Progress Notes (Signed)
Addended by: Neomia Dear on: 04/17/2011 06:49 PM   Modules accepted: Orders

## 2011-04-18 NOTE — Telephone Encounter (Signed)
Agree, patient will need to demonstrate responsibility for his pain control or it is not safe for [place] to prescribe at t5hese dose - he need to be seen. It is no longer safe for his mother to continue to pick up his medications.

## 2011-05-13 ENCOUNTER — Encounter: Payer: Medicaid Other | Admitting: Internal Medicine

## 2011-05-20 ENCOUNTER — Encounter: Payer: Self-pay | Admitting: Internal Medicine

## 2011-06-10 ENCOUNTER — Encounter: Payer: Medicaid Other | Admitting: Internal Medicine

## 2011-07-09 ENCOUNTER — Encounter: Payer: Medicaid Other | Admitting: Internal Medicine

## 2011-09-20 ENCOUNTER — Other Ambulatory Visit: Payer: Self-pay | Admitting: Nephrology

## 2011-09-20 ENCOUNTER — Ambulatory Visit
Admission: RE | Admit: 2011-09-20 | Discharge: 2011-09-20 | Disposition: A | Discharge status: Home / Self Care | Payer: Medicaid Other | Source: Ambulatory Visit | Attending: Nephrology | Admitting: Nephrology

## 2011-09-20 DIAGNOSIS — M549 Dorsalgia, unspecified: Secondary | ICD-10-CM

## 2012-02-02 ENCOUNTER — Emergency Department (HOSPITAL_COMMUNITY)
Admission: EM | Admit: 2012-02-02 | Discharge: 2012-02-02 | Disposition: A | Discharge status: Home / Self Care | Payer: Medicaid Other | Attending: Emergency Medicine | Admitting: Emergency Medicine

## 2012-02-02 ENCOUNTER — Encounter (HOSPITAL_COMMUNITY): Payer: Self-pay | Admitting: *Deleted

## 2012-02-02 DIAGNOSIS — F17200 Nicotine dependence, unspecified, uncomplicated: Secondary | ICD-10-CM | POA: Insufficient documentation

## 2012-02-02 DIAGNOSIS — L408 Other psoriasis: Secondary | ICD-10-CM | POA: Insufficient documentation

## 2012-02-02 DIAGNOSIS — K861 Other chronic pancreatitis: Secondary | ICD-10-CM | POA: Insufficient documentation

## 2012-02-02 DIAGNOSIS — R1013 Epigastric pain: Secondary | ICD-10-CM

## 2012-02-02 DIAGNOSIS — F329 Major depressive disorder, single episode, unspecified: Secondary | ICD-10-CM | POA: Insufficient documentation

## 2012-02-02 DIAGNOSIS — F3289 Other specified depressive episodes: Secondary | ICD-10-CM | POA: Insufficient documentation

## 2012-02-02 DIAGNOSIS — E785 Hyperlipidemia, unspecified: Secondary | ICD-10-CM | POA: Insufficient documentation

## 2012-02-02 DIAGNOSIS — R112 Nausea with vomiting, unspecified: Secondary | ICD-10-CM

## 2012-02-02 LAB — CBC WITH DIFFERENTIAL/PLATELET
Basophils Absolute: 0.1 10*3/uL (ref 0.0–0.1)
Basophils Relative: 1 % (ref 0–1)
Eosinophils Relative: 2 % (ref 0–5)
HCT: 44.6 % (ref 39.0–52.0)
MCHC: 33.6 g/dL (ref 30.0–36.0)
MCV: 91.2 fL (ref 78.0–100.0)
Monocytes Absolute: 0.4 10*3/uL (ref 0.1–1.0)
RDW: 13.8 % (ref 11.5–15.5)

## 2012-02-02 LAB — BASIC METABOLIC PANEL
Calcium: 9.5 mg/dL (ref 8.4–10.5)
Creatinine, Ser: 0.75 mg/dL (ref 0.50–1.35)
GFR calc Af Amer: >90 mL/min (ref >90)

## 2012-02-02 LAB — HEPATIC FUNCTION PANEL
ALT: 5 U/L (ref 0–53)
Albumin: 3.8 g/dL (ref 3.5–5.2)
Alkaline Phosphatase: 99 U/L (ref 39–117)
Total Protein: 7.4 g/dL (ref 6.0–8.3)

## 2012-02-02 LAB — LIPASE, BLOOD: Lipase: 30 U/L (ref 11–59)

## 2012-02-02 MED ORDER — MORPHINE SULFATE 4 MG/ML IJ SOLN
4.0000 mg | Freq: Once | INTRAMUSCULAR | Status: AC
  Administered 2012-02-02: 4 mg via INTRAVENOUS
  Filled 2012-02-02: qty 1

## 2012-02-02 MED ORDER — ONDANSETRON HCL 4 MG/2ML IJ SOLN
4.0000 mg | Freq: Once | INTRAMUSCULAR | Status: AC
  Administered 2012-02-02: 4 mg via INTRAVENOUS
  Filled 2012-02-02: qty 2

## 2012-02-02 MED ORDER — SODIUM CHLORIDE 0.9 % IV BOLUS (SEPSIS)
1000.0000 mL | Freq: Once | INTRAVENOUS | Status: AC
  Administered 2012-02-02: 1000 mL via INTRAVENOUS

## 2012-02-02 MED ORDER — PANTOPRAZOLE SODIUM 40 MG IV SOLR
40.0000 mg | Freq: Once | INTRAVENOUS | Status: AC
  Administered 2012-02-02: 40 mg via INTRAVENOUS
  Filled 2012-02-02: qty 40

## 2012-02-02 NOTE — ED Notes (Signed)
Pt medicated for pain, ride on the way to hospital. To be discharged when ride arrives. Pt had no further questions. States he feels much better.

## 2012-02-02 NOTE — ED Notes (Signed)
Patient with onset of abd pain and n/v on Friday.  He states he is unable to keep his medications down due to sx.

## 2012-02-02 NOTE — ED Notes (Signed)
The patient is AOx4 and comfortable with his discharge instructions.  His ride home should arrive in "five to ten minutes."

## 2012-02-02 NOTE — ED Notes (Signed)
Pt presents to department for evaluation of diffuse abdominal pain. States chronic pancreatitis. Onset yesterday. 10/10 pain at the time. Also states nausea/vomiting. Denies urinary symptoms. Denies fever. He is alert and oriented x4. States he is taking medications, but no relief.

## 2012-02-02 NOTE — ED Provider Notes (Signed)
Medical screening examination/treatment/procedure(s) were conducted as a shared visit with non-physician practitioner(s) and myself.  I personally evaluated the patient during the encounter Has history of pancreatitis, and peptic ulcer disease.  He complains of abdominal pain, with nausea, and vomiting.  No diarrhea.  No respiratory symptoms, or fever.  He has not had any alcohol for the past 5 years.  On, examination.  He is in no distress.  He has mild epigastric tenderness, with no peritoneal signs.  His lipase is normal.  His electrolytes are normal.  We will give him analgesics and release him to home.  Cheri Guppy, MD 02/02/12 [phone]

## 2012-02-02 NOTE — ED Notes (Signed)
Pt states he feels a little better. 5/10 abdominal pain. Requesting pain medication. Vital signs stable. No nausea/vomiting. No signs of distress noted.

## 2012-02-02 NOTE — ED Provider Notes (Signed)
History     CSN: [id number]  Arrival date & time 02/02/12  1458   First MD Initiated Contact with Patient 02/02/12 1738      Chief Complaint  Patient presents with  . Abdominal Pain    (Consider location/radiation/quality/duration/timing/severity/associated sxs/prior treatment) HPI Comments: Patient reports gradual onset of NVD and abdominal pain that started yesterday. He reports the pain being located in his epigastric area and also RUQ. It does not radiate and is dull and cramping in nature. He rates the pain 8/10 currently. He has a medical history of chronic pancreatitis that is managed on an outpatient basis by Dr. Jamey Ripa. He has not had an acute exacerbation of his pancreatitis "in years." He says this is what his acute pancreatitis feels like. He reports associated nausea, vomiting, and diarrhea.   Patient is a 51 y.o. male presenting with abdominal pain.  Abdominal Pain The primary symptoms of the illness include abdominal pain, nausea, vomiting and diarrhea. The primary symptoms of the illness do not include fever, fatigue, shortness of breath or dysuria.  Symptoms associated with the illness do not include chills, diaphoresis, constipation or back pain.    Past Medical History  Diagnosis Date  . Chronic pancreatitis   . Psoriasis   . Chronic pancreatitis   . Psoriasis     extensive, responds to strong steroids, unable to start molecular therapies due to cost  . Depression   . Hyperlipidemia   . Heme positive stool     neg colonoscopies 11/2008 and 01/2008  . Duodenitis     by EGD 1/07  . RUE weakness     2/2 cervical spondylosis  . Emphysema     unclear diagnosis noted in previous EMR  . Smoking   . History of alcohol abuse     Past Surgical History  Procedure Date  . Tympanoplasty     Family History  Problem Relation Age of Onset  . Diabetes Mother   . Heart disease Father   . Cancer Father     ?kidney or liver cancer    History  Substance Use  Topics  . Smoking status: Current Everyday Smoker -- 1.0 packs/day for 15 years    Types: Cigarettes  . Smokeless tobacco: Not on file   Comment: PATIENT HAS CUT DOWN ON THE AMOUNT HE SMOKES  . Alcohol Use: No     quit about 2-3 years ago      Review of Systems  Constitutional: Negative for fever, chills, diaphoresis and fatigue.  Respiratory: Negative for cough, shortness of breath and wheezing.   Cardiovascular: Negative for chest pain.  Gastrointestinal: Positive for nausea, vomiting, abdominal pain and diarrhea. Negative for constipation and blood in stool.  Genitourinary: Negative for dysuria and flank pain.  Musculoskeletal: Negative for back pain.  Skin: Negative for rash and wound.  Neurological: Negative for dizziness, weakness, light-headedness and headaches.    Allergies  Review of patient's allergies indicates no known allergies.  Home Medications   Current Outpatient Rx  Name Route Sig Dispense Refill  . MORPHINE SULFATE ER 60 MG PO TB12 Oral Take 1 tablet (60 mg total) by mouth 2 (two) times daily. 40 tablet 0  . ADULT MULTIVITAMIN W/MINERALS CH Oral Take 1 tablet by mouth daily.    Marland Kitchen OMEPRAZOLE 20 MG PO CPDR Oral Take 20 mg by mouth 2 (two) times daily.    . OXYCODONE-ACETAMINOPHEN 7.5-325 MG PO TABS Oral Take 1 tablet by mouth every 4 (four) hours as needed.  For pain    . PANCRELIPASE (LIP-PROT-AMYL) PO Oral Take 5 capsules by mouth 3 (three) times daily with meals.      BP 107/69  Pulse 109  Temp 98.2 F (36.8 C) (Oral)  Resp 14  Ht 5\' 8"  (1.727 m)  Wt 127 lb (57.607 kg)  BMI 19.31 kg/m2  SpO2 98%  Physical Exam  Nursing note and vitals reviewed. Constitutional: He is oriented to person, place, and time. He appears well-developed. No distress.       Thin male who appears uncomfortable.   HENT:  Head: Normocephalic and atraumatic.  Mouth/Throat: No oropharyngeal exudate.  Eyes: Conjunctivae are normal. Pupils are equal, round, and reactive to  light. No scleral icterus.  Neck: Normal range of motion.  Cardiovascular: Normal rate and regular rhythm.  Exam reveals no gallop and no friction rub.   No murmur heard. Pulmonary/Chest: Effort normal. No respiratory distress. He has no wheezes. He has no rales. He exhibits no tenderness.  Abdominal: Soft. He exhibits no distension. There is no rebound and no guarding.       Notable tenderness to palpation in epigastrium and RUQ. Soft and nontender in all other quadrants.   Musculoskeletal: Normal range of motion.  Neurological: He is alert and oriented to person, place, and time.  Skin: Skin is warm and dry. He is not diaphoretic.  Psychiatric: He has a normal mood and affect. His behavior is normal.    ED Course  Procedures (including critical care time)  Labs Reviewed  BASIC METABOLIC PANEL - Abnormal; Notable for the following:    Glucose, Bld 129 (*)     All other components within normal limits  CBC WITH DIFFERENTIAL  LIPASE, BLOOD  HEPATIC FUNCTION PANEL   No results found.   No diagnosis found.    MDM  6:21 PM Patient given morphine for pain control. Lipase not elevated. I will check a hepatic function panel.   7:00 PM Labs are all in normal range. Low suspicion for acute pancreatitis with labs in normal range. Patient can be discharged home with pain control and follow up with Dr. Jamey Ripa for outpatient management of chronic pancreatitis. Plan discussed with Dr. Weldon Inches who is agreeable.        Emilia Beck, PA-C 02/02/12 1901

## 2012-02-03 NOTE — ED Provider Notes (Signed)
Medical screening examination/treatment/procedure(s) were conducted as a shared visit with non-physician practitioner(s) and myself.  I personally evaluated the patient during the encounter  Cheri Guppy, MD 02/03/12 [phone]

## 2012-08-11 ENCOUNTER — Emergency Department (HOSPITAL_COMMUNITY)
Admission: EM | Admit: 2012-08-11 | Discharge: 2012-08-11 | Disposition: A | Discharge status: Home / Self Care | Payer: Medicaid Other | Attending: Emergency Medicine | Admitting: Emergency Medicine

## 2012-08-11 ENCOUNTER — Encounter (HOSPITAL_COMMUNITY): Payer: Self-pay | Admitting: Cardiology

## 2012-08-11 DIAGNOSIS — K861 Other chronic pancreatitis: Secondary | ICD-10-CM | POA: Insufficient documentation

## 2012-08-11 DIAGNOSIS — Z8659 Personal history of other mental and behavioral disorders: Secondary | ICD-10-CM | POA: Insufficient documentation

## 2012-08-11 DIAGNOSIS — R197 Diarrhea, unspecified: Secondary | ICD-10-CM | POA: Insufficient documentation

## 2012-08-11 DIAGNOSIS — Z8719 Personal history of other diseases of the digestive system: Secondary | ICD-10-CM | POA: Insufficient documentation

## 2012-08-11 DIAGNOSIS — R1013 Epigastric pain: Secondary | ICD-10-CM | POA: Insufficient documentation

## 2012-08-11 DIAGNOSIS — R059 Cough, unspecified: Secondary | ICD-10-CM | POA: Insufficient documentation

## 2012-08-11 DIAGNOSIS — Z872 Personal history of diseases of the skin and subcutaneous tissue: Secondary | ICD-10-CM | POA: Insufficient documentation

## 2012-08-11 DIAGNOSIS — R112 Nausea with vomiting, unspecified: Secondary | ICD-10-CM | POA: Insufficient documentation

## 2012-08-11 DIAGNOSIS — R05 Cough: Secondary | ICD-10-CM | POA: Insufficient documentation

## 2012-08-11 DIAGNOSIS — R109 Unspecified abdominal pain: Secondary | ICD-10-CM

## 2012-08-11 DIAGNOSIS — Z8639 Personal history of other endocrine, nutritional and metabolic disease: Secondary | ICD-10-CM | POA: Insufficient documentation

## 2012-08-11 DIAGNOSIS — Z862 Personal history of diseases of the blood and blood-forming organs and certain disorders involving the immune mechanism: Secondary | ICD-10-CM | POA: Insufficient documentation

## 2012-08-11 DIAGNOSIS — F17200 Nicotine dependence, unspecified, uncomplicated: Secondary | ICD-10-CM | POA: Insufficient documentation

## 2012-08-11 LAB — CBC WITH DIFFERENTIAL/PLATELET
Basophils Absolute: 0 10*3/uL (ref 0.0–0.1)
Basophils Relative: 0 % (ref 0–1)
Eosinophils Absolute: 0.1 10*3/uL (ref 0.0–0.7)
Hemoglobin: 14.2 g/dL (ref 13.0–17.0)
MCH: 30.9 pg (ref 26.0–34.0)
MCHC: 34.3 g/dL (ref 30.0–36.0)
Monocytes Relative: 5 % (ref 3–12)
Neutro Abs: 4.2 10*3/uL (ref 1.7–7.7)
Neutrophils Relative %: 65 % (ref 43–77)
Platelets: 137 10*3/uL — ABNORMAL LOW (ref 150–400)

## 2012-08-11 LAB — COMPREHENSIVE METABOLIC PANEL
ALT: 10 U/L (ref 0–53)
AST: 19 U/L (ref 0–37)
CO2: 23 mEq/L (ref 19–32)
Calcium: 9.6 mg/dL (ref 8.4–10.5)
Chloride: 101 mEq/L (ref 96–112)
Creatinine, Ser: 0.6 mg/dL (ref 0.50–1.35)
GFR calc Af Amer: >90 mL/min (ref >90)
GFR calc non Af Amer: >90 mL/min (ref >90)
Glucose, Bld: 101 mg/dL — ABNORMAL HIGH (ref 70–99)
Total Bilirubin: 0.2 mg/dL — ABNORMAL LOW (ref 0.3–1.2)

## 2012-08-11 LAB — LIPASE, BLOOD: Lipase: 29 U/L (ref 11–59)

## 2012-08-11 MED ORDER — OXYCODONE-ACETAMINOPHEN 5-325 MG PO TABS: 1.0000 | ORAL_TABLET | ORAL | Status: DC | PRN

## 2012-08-11 MED ORDER — OXYCODONE-ACETAMINOPHEN 5-325 MG PO TABS
2.0000 | ORAL_TABLET | Freq: Once | ORAL | Status: AC
  Administered 2012-08-11: 2 via ORAL
  Filled 2012-08-11: qty 2

## 2012-08-11 MED ORDER — ONDANSETRON HCL 8 MG PO TABS: 8.0000 mg | ORAL_TABLET | ORAL | Status: DC | PRN

## 2012-08-11 MED ORDER — HYDROMORPHONE HCL PF 2 MG/ML IJ SOLN
2.0000 mg | Freq: Once | INTRAMUSCULAR | Status: AC
  Administered 2012-08-11: 2 mg via INTRAMUSCULAR
  Filled 2012-08-11: qty 1

## 2012-08-11 MED ORDER — ONDANSETRON 4 MG PO TBDP
8.0000 mg | ORAL_TABLET | Freq: Once | ORAL | Status: AC
  Administered 2012-08-11: 8 mg via ORAL
  Filled 2012-08-11: qty 2

## 2012-08-11 NOTE — ED Notes (Signed)
Pt reports abd pain and n/v that started on Sunday. No distress noted at this time. Reports hx of pancreatitis.

## 2012-08-11 NOTE — ED Provider Notes (Signed)
History     CSN: [id number]  Arrival date & time 08/11/12  0819   First MD Initiated Contact with Patient 08/11/12 [phone]      Chief Complaint  Patient presents with  . Abdominal Pain     Patient is a 52 y.o. male presenting with abdominal pain. The history is provided by the patient.  Abdominal Pain Pain location:  Epigastric Pain quality: sharp   Pain radiation: back. Onset quality:  Gradual Duration:  3 days Timing:  Constant Progression:  Worsening Chronicity:  Recurrent Relieved by:  Nothing Exacerbated by: palpation. Associated symptoms: cough, diarrhea, nausea and vomiting   Associated symptoms: no chest pain, no fever, no hematemesis, no hematochezia, no melena and no shortness of breath   pt reports he has h/o chronic pancreatitis He reports it "flared up" about 3 days ago No cp/sob Denies etoh use recently   Past Medical History  Diagnosis Date  . Chronic pancreatitis   . Psoriasis   . Chronic pancreatitis   . Psoriasis     extensive, responds to strong steroids, unable to start molecular therapies due to cost  . Depression   . Hyperlipidemia   . Heme positive stool     neg colonoscopies 11/2008 and 01/2008  . Duodenitis     by EGD 1/07  . RUE weakness     2/2 cervical spondylosis  . Emphysema     unclear diagnosis noted in previous EMR  . Smoking   . History of alcohol abuse     Past Surgical History  Procedure Laterality Date  . Tympanoplasty      Family History  Problem Relation Age of Onset  . Diabetes Mother   . Heart disease Father   . Cancer Father     ?kidney or liver cancer    History  Substance Use Topics  . Smoking status: Current Every Day Smoker -- 1.00 packs/day for 15 years    Types: Cigarettes  . Smokeless tobacco: Not on file     Comment: PATIENT HAS CUT DOWN ON THE AMOUNT HE SMOKES  . Alcohol Use: No     Comment: quit about 2-3 years ago      Review of Systems  Constitutional: Negative for fever.  Respiratory:  Positive for cough. Negative for shortness of breath.   Cardiovascular: Negative for chest pain.  Gastrointestinal: Positive for nausea, vomiting, abdominal pain and diarrhea. Negative for melena, hematochezia and hematemesis.  All other systems reviewed and are negative.    Allergies  Review of patient's allergies indicates no known allergies.  Home Medications   Current Outpatient Rx  Name  Route  Sig  Dispense  Refill  . morphine (MS CONTIN) 60 MG 12 hr tablet   Oral   Take 1 tablet (60 mg total) by mouth 2 (two) times daily.   40 tablet   0   . Multiple Vitamin (MULTIVITAMIN WITH MINERALS) TABS   Oral   Take 1 tablet by mouth daily.         Marland Kitchen omeprazole (PRILOSEC) 20 MG capsule   Oral   Take 20 mg by mouth 2 (two) times daily.         Marland Kitchen oxyCODONE-acetaminophen (PERCOCET) 7.5-325 MG per tablet   Oral   Take 1 tablet by mouth every 4 (four) hours as needed. For pain         . PANCRELIPASE, LIP-PROT-AMYL, PO   Oral   Take 5 capsules by mouth 3 (three) times daily with  meals.           BP 106/79  Pulse 101  Temp(Src) 97.8 F (36.6 C) (Oral)  SpO2 92%  Physical Exam CONSTITUTIONAL:thin appearing HEAD AND FACE: Normocephalic/atraumatic EYES: EOMI/PERRL, no icterus ENMT: Mucous membranes moist NECK: supple no meningeal signs SPINE:entire spine nontender CV: S1/S2 noted, no murmurs/rubs/gallops noted LUNGS: Lungs are clear to auscultation bilaterally, no apparent distress ABDOMEN: soft, epigastric tenderness, tenderness is moderate, no rebound or guarding GU:no cva tenderness NEURO: Pt is awake/alert, moves all extremitiesx4 EXTREMITIES: pulses normal, full ROM SKIN: warm, color normal PSYCH: no abnormalities of mood noted  ED Course  Procedures   Labs Reviewed  COMPREHENSIVE METABOLIC PANEL  CBC WITH DIFFERENTIAL  LIPASE, BLOOD     8:46 AM Pt with h/o chronic pancreatitis He admits he recent ran out of pain meds, and this is confirmed via  narcotic database He reports this pain is similar to prior episodes of his pancreatitis He has f/u next month with new PCP for pain control Will check labs/ekg and reassess Do not feel imaging warranted at this time He reports h/o COPD, he has had some cough but lungs clear.  No signs of resp. Distress at this time  11:33 AM Pt improved, he is stable I did write short term Rx for pain meds while he is awaiting new physician  MDM  Nursing notes including past medical history and social history reviewed and considered in documentation Labs/vital reviewed and considered Previous records reviewed and considered - h/o chronic pancreatitis Narcotic database reviewed       Date: 08/11/2012  Rate: 85  Rhythm: normal sinus rhythm  QRS Axis: normal  Intervals: normal  ST/T Wave abnormalities: normal  Conduction Disutrbances:none  Narrative Interpretation:   Old EKG Reviewed: unchanged    Joya Gaskins, MD 08/11/12 1133

## 2012-11-04 ENCOUNTER — Emergency Department (HOSPITAL_COMMUNITY)
Admission: EM | Admit: 2012-11-04 | Discharge: 2012-11-04 | Disposition: A | Discharge status: Home / Self Care | Payer: Medicaid Other | Attending: Emergency Medicine | Admitting: Emergency Medicine

## 2012-11-04 ENCOUNTER — Encounter (HOSPITAL_COMMUNITY): Payer: Self-pay | Admitting: Emergency Medicine

## 2012-11-04 DIAGNOSIS — G8929 Other chronic pain: Secondary | ICD-10-CM | POA: Insufficient documentation

## 2012-11-04 DIAGNOSIS — Z79899 Other long term (current) drug therapy: Secondary | ICD-10-CM | POA: Insufficient documentation

## 2012-11-04 DIAGNOSIS — Z8719 Personal history of other diseases of the digestive system: Secondary | ICD-10-CM | POA: Insufficient documentation

## 2012-11-04 DIAGNOSIS — Z8659 Personal history of other mental and behavioral disorders: Secondary | ICD-10-CM | POA: Insufficient documentation

## 2012-11-04 DIAGNOSIS — Z8709 Personal history of other diseases of the respiratory system: Secondary | ICD-10-CM | POA: Insufficient documentation

## 2012-11-04 DIAGNOSIS — R109 Unspecified abdominal pain: Secondary | ICD-10-CM | POA: Insufficient documentation

## 2012-11-04 DIAGNOSIS — R197 Diarrhea, unspecified: Secondary | ICD-10-CM | POA: Insufficient documentation

## 2012-11-04 DIAGNOSIS — Z872 Personal history of diseases of the skin and subcutaneous tissue: Secondary | ICD-10-CM | POA: Insufficient documentation

## 2012-11-04 DIAGNOSIS — Z862 Personal history of diseases of the blood and blood-forming organs and certain disorders involving the immune mechanism: Secondary | ICD-10-CM | POA: Insufficient documentation

## 2012-11-04 DIAGNOSIS — F329 Major depressive disorder, single episode, unspecified: Secondary | ICD-10-CM | POA: Insufficient documentation

## 2012-11-04 DIAGNOSIS — R110 Nausea: Secondary | ICD-10-CM | POA: Insufficient documentation

## 2012-11-04 DIAGNOSIS — Z8639 Personal history of other endocrine, nutritional and metabolic disease: Secondary | ICD-10-CM | POA: Insufficient documentation

## 2012-11-04 DIAGNOSIS — F3289 Other specified depressive episodes: Secondary | ICD-10-CM | POA: Insufficient documentation

## 2012-11-04 DIAGNOSIS — F17200 Nicotine dependence, unspecified, uncomplicated: Secondary | ICD-10-CM | POA: Insufficient documentation

## 2012-11-04 LAB — CBC WITH DIFFERENTIAL/PLATELET
Basophils Absolute: 0 10*3/uL (ref 0.0–0.1)
Eosinophils Relative: 1 % (ref 0–5)
Lymphocytes Relative: 34 % (ref 12–46)
Lymphs Abs: 2.6 10*3/uL (ref 0.7–4.0)
Neutro Abs: 4.6 10*3/uL (ref 1.7–7.7)
Neutrophils Relative %: 60 % (ref 43–77)
Platelets: 164 10*3/uL (ref 150–400)
RBC: 4.65 MIL/uL (ref 4.22–5.81)
RDW: 13 % (ref 11.5–15.5)
WBC: 7.8 10*3/uL (ref 4.0–10.5)

## 2012-11-04 LAB — COMPREHENSIVE METABOLIC PANEL
ALT: 8 U/L (ref 0–53)
AST: 16 U/L (ref 0–37)
Alkaline Phosphatase: 111 U/L (ref 39–117)
CO2: 22 mEq/L (ref 19–32)
Calcium: 9.3 mg/dL (ref 8.4–10.5)
Chloride: 108 mEq/L (ref 96–112)
GFR calc non Af Amer: >90 mL/min (ref >90)
Glucose, Bld: 105 mg/dL — ABNORMAL HIGH (ref 70–99)
Potassium: 4.3 mEq/L (ref 3.5–5.1)
Sodium: 140 mEq/L (ref 135–145)

## 2012-11-04 LAB — URINALYSIS, ROUTINE W REFLEX MICROSCOPIC
Bilirubin Urine: NEGATIVE
Glucose, UA: NEGATIVE mg/dL
Hgb urine dipstick: NEGATIVE
Protein, ur: NEGATIVE mg/dL

## 2012-11-04 MED ORDER — ONDANSETRON 4 MG PO TBDP
8.0000 mg | ORAL_TABLET | Freq: Once | ORAL | Status: AC
  Administered 2012-11-04: 8 mg via ORAL
  Filled 2012-11-04: qty 2

## 2012-11-04 MED ORDER — MORPHINE SULFATE 4 MG/ML IJ SOLN
4.0000 mg | Freq: Once | INTRAMUSCULAR | Status: AC
  Administered 2012-11-04: 4 mg via INTRAMUSCULAR
  Filled 2012-11-04 (×2): qty 1

## 2012-11-04 MED ORDER — OXYCODONE-ACETAMINOPHEN 5-325 MG PO TABS: ORAL_TABLET | ORAL | Status: DC

## 2012-11-04 MED ORDER — SODIUM CHLORIDE 0.9 % IV BOLUS (SEPSIS)
1000.0000 mL | Freq: Once | INTRAVENOUS | Status: AC
  Administered 2012-11-04: 1000 mL via INTRAVENOUS

## 2012-11-04 NOTE — ED Notes (Addendum)
Pt reports has history of pancreatitis. Pt reports right upper abdominal pain with nausea onset yesterday. Pt reports pain different then his normal pancreatitis pain. Pt took percocet last night without relief. Pt denies blood in stools.

## 2012-11-04 NOTE — ED Provider Notes (Signed)
History     CSN: [id number]  Arrival date & time 11/04/12  7829   First MD Initiated Contact with Patient 11/04/12 1114      Chief Complaint  Patient presents with  . Abdominal Pain    (Consider location/radiation/quality/duration/timing/severity/associated sxs/prior treatment) HPI Comments: 52 y.o. Male with PMHx of chronic pancreatitis, heme positive stool, and COPD presents today with what he describes as a flare up as his pancreatitis. To clarify nursing note, he states it is not radiating around to his back like it usually does. Pt says he recognizes the pain and it felt similar onset yesterday morning. Pt states he took his last percocet which helped a little. Was able to keep down some broth which was comforting as well. Pain has progressed from 5/10 at that point to 9/10 today. Pain is sharp, constant, and localized to the central abdomen. Pt admits nausea, some mild diarrhea. Denies fever, vomiting, SOB, chest pain, hematuria, dysuria, hematochezia.   Pt denies daily drinking stating he quit 6 years ago. Pt states he is a "hard stick" and last time they had to put in a PICC line. Pt was admitted for pancreatitis about a year ago.   Pt is between PCPs - used to see Dr. Audrie Lia who retired and is scheduled to see his new PCP this Friday (does not know name or practice). Pt does not see a GI doc.   Patient is a 52 y.o. male presenting with abdominal pain.  Abdominal Pain Associated symptoms: diarrhea and nausea   Associated symptoms: no chest pain, no constipation, no dysuria, no fever, no hematuria, no shortness of breath and no vomiting     Past Medical History  Diagnosis Date  . Chronic pancreatitis   . Psoriasis   . Chronic pancreatitis   . Psoriasis     extensive, responds to strong steroids, unable to start molecular therapies due to cost  . Depression   . Hyperlipidemia   . Heme positive stool     neg colonoscopies 11/2008 and 01/2008  . Duodenitis     by EGD 1/07   . RUE weakness     2/2 cervical spondylosis  . Emphysema     unclear diagnosis noted in previous EMR  . Smoking   . History of alcohol abuse     Past Surgical History  Procedure Laterality Date  . Tympanoplasty      Family History  Problem Relation Age of Onset  . Diabetes Mother   . Heart disease Father   . Cancer Father     ?kidney or liver cancer    History  Substance Use Topics  . Smoking status: Current Every Day Smoker -- 1.00 packs/day for 15 years    Types: Cigarettes  . Smokeless tobacco: Not on file     Comment: PATIENT HAS CUT DOWN ON THE AMOUNT HE SMOKES  . Alcohol Use: No     Comment:  quit 2008      Review of Systems  Constitutional: Negative for fever and diaphoresis.  HENT: Negative for neck pain and neck stiffness.   Eyes: Negative for visual disturbance.  Respiratory: Negative for apnea, chest tightness and shortness of breath.   Cardiovascular: Negative for chest pain and palpitations.  Gastrointestinal: Positive for nausea, abdominal pain and diarrhea. Negative for vomiting, constipation, blood in stool and abdominal distention.       Central, localized abdominal pain  Genitourinary: Negative for dysuria and hematuria.  Musculoskeletal: Negative for arthralgias and gait problem.  Skin: Negative for rash.  Neurological: Negative for dizziness, weakness, light-headedness, numbness and headaches.    Allergies  Review of patient's allergies indicates no known allergies.  Home Medications   Current Outpatient Rx  Name  Route  Sig  Dispense  Refill  . morphine (MS CONTIN) 60 MG 12 hr tablet   Oral   Take 1 tablet (60 mg total) by mouth 2 (two) times daily.   40 tablet   0   . Multiple Vitamin (MULTIVITAMIN WITH MINERALS) TABS   Oral   Take 1 tablet by mouth daily.         Marland Kitchen omeprazole (PRILOSEC) 20 MG capsule   Oral   Take 20 mg by mouth 2 (two) times daily.         . ondansetron (ZOFRAN) 8 MG tablet   Oral   Take 1 tablet (8  mg total) by mouth every 4 (four) hours as needed for nausea.   4 tablet   0   . oxyCODONE-acetaminophen (PERCOCET) 7.5-325 MG per tablet   Oral   Take 1 tablet by mouth every 4 (four) hours as needed for pain.          Marland Kitchen oxyCODONE-acetaminophen (PERCOCET/ROXICET) 5-325 MG per tablet   Oral   Take 1 tablet by mouth every 4 (four) hours as needed for pain.   15 tablet   0   . PANCRELIPASE, LIP-PROT-AMYL, PO   Oral   Take 5 capsules by mouth 3 (three) times daily with meals.           BP 114/76  Pulse 88  Temp(Src) 97.6 F (36.4 C) (Oral)  Resp 17  Ht 5\' 8"  (1.727 m)  Wt 130 lb (58.968 kg)  BMI 19.77 kg/m2  SpO2 99%  Physical Exam  Nursing note and vitals reviewed. Constitutional: He is oriented to person, place, and time. He appears well-developed and well-nourished. No distress.  HENT:  Head: Normocephalic and atraumatic.  Eyes: Conjunctivae and EOM are normal.  Neck: Normal range of motion. Neck supple.  No meningeal signs  Cardiovascular: Normal rate, regular rhythm and normal heart sounds.  Exam reveals no gallop and no friction rub.   No murmur heard. Pulmonary/Chest: Effort normal and breath sounds normal. No respiratory distress. He has no wheezes. He has no rales. He exhibits no tenderness.  Abdominal: Soft. Bowel sounds are normal. He exhibits no distension and no mass. There is tenderness. There is no rebound and no guarding.  Mild central abdominal tenderness. No tenderness at McBurney's point. No rovsing sign. No obturator sign.   Musculoskeletal: Normal range of motion. He exhibits no edema and no tenderness.  Neurological: He is alert and oriented to person, place, and time. No cranial nerve deficit.  Skin: Skin is warm and dry. He is not diaphoretic. No erythema.  Psychiatric: He has a normal mood and affect.    ED Course  Procedures (including critical care time)  Labs Reviewed  COMPREHENSIVE METABOLIC PANEL - Abnormal; Notable for the  following:    Glucose, Bld 105 (*)    Total Bilirubin 0.2 (*)    All other components within normal limits  CBC WITH DIFFERENTIAL  LIPASE, BLOOD  URINALYSIS, ROUTINE W REFLEX MICROSCOPIC   No results found. Medications  morphine 4 MG/ML injection 4 mg (4 mg Intramuscular Given 11/04/12 1155)  ondansetron (ZOFRAN-ODT) disintegrating tablet 8 mg (8 mg Oral Given 11/04/12 1217)  sodium chloride 0.9 % bolus 1,000 mL (1,000 mLs Intravenous New Bag/Given 11/04/12  1218)    New Prescriptions   OXYCODONE-ACETAMINOPHEN (PERCOCET) 5-325 MG PER TABLET    Take one or two tablets by mouth every 4 to 6 hours as needed for pain.     1. Chronic pain       MDM  52 y.o. Male with PMHx of chronic pancreatitis who states this pain is similar to previous attacks. Will check lipase. Previous and notes imaging reviewed. Will give fluids and manage pain. Labs returned within normal limits to suggest not a flare up of pancreatitis. Liver enzymes within normal limits as well. Abdominal exam fairly benign. Not suspecting for appendicitis, small bowel obstruction, diverticulitis, cholecystitis. Discussed pt case with Dr. Silverio Lay along with the fact that pt has follow up appt with new PCP on Friday.  Will provide minimal pain meds until pt can be seen by his doctor.  At this time there does not appear to be any evidence of an acute emergency medical condition and the patient appears stable for discharge with appropriate outpatient follow up. Diagnosis was discussed with patient who verbalizes understanding and is agreeable to discharge.      Glade Nurse, PA-C 11/04/12 1323

## 2012-11-04 NOTE — ED Provider Notes (Signed)
Medical screening examination/treatment/procedure(s) were performed by non-physician practitioner and as supervising physician I was immediately available for consultation/collaboration.   David H Yao, MD 11/04/12 1640 

## 2013-03-28 ENCOUNTER — Emergency Department (HOSPITAL_COMMUNITY)
Admission: EM | Admit: 2013-03-28 | Discharge: 2013-03-28 | Discharge status: Against Medical Advice | Payer: Medicaid Other | Attending: Emergency Medicine | Admitting: Emergency Medicine

## 2013-03-28 ENCOUNTER — Encounter (HOSPITAL_COMMUNITY): Payer: Self-pay | Admitting: *Deleted

## 2013-03-28 DIAGNOSIS — Z862 Personal history of diseases of the blood and blood-forming organs and certain disorders involving the immune mechanism: Secondary | ICD-10-CM | POA: Insufficient documentation

## 2013-03-28 DIAGNOSIS — Z8639 Personal history of other endocrine, nutritional and metabolic disease: Secondary | ICD-10-CM | POA: Insufficient documentation

## 2013-03-28 DIAGNOSIS — Z8659 Personal history of other mental and behavioral disorders: Secondary | ICD-10-CM | POA: Insufficient documentation

## 2013-03-28 DIAGNOSIS — Z872 Personal history of diseases of the skin and subcutaneous tissue: Secondary | ICD-10-CM | POA: Insufficient documentation

## 2013-03-28 DIAGNOSIS — F17200 Nicotine dependence, unspecified, uncomplicated: Secondary | ICD-10-CM | POA: Insufficient documentation

## 2013-03-28 DIAGNOSIS — K861 Other chronic pancreatitis: Secondary | ICD-10-CM | POA: Insufficient documentation

## 2013-03-28 DIAGNOSIS — F1010 Alcohol abuse, uncomplicated: Secondary | ICD-10-CM | POA: Insufficient documentation

## 2013-03-28 DIAGNOSIS — Z79899 Other long term (current) drug therapy: Secondary | ICD-10-CM | POA: Insufficient documentation

## 2013-03-28 DIAGNOSIS — Z8709 Personal history of other diseases of the respiratory system: Secondary | ICD-10-CM | POA: Insufficient documentation

## 2013-03-28 LAB — COMPREHENSIVE METABOLIC PANEL
AST: 21 U/L (ref 0–37)
Albumin: 3.8 g/dL (ref 3.5–5.2)
Alkaline Phosphatase: 98 U/L (ref 39–117)
CO2: 21 mEq/L (ref 19–32)
Chloride: 107 mEq/L (ref 96–112)
Creatinine, Ser: 0.76 mg/dL (ref 0.50–1.35)
GFR calc non Af Amer: >90 mL/min (ref >90)
Glucose, Bld: 93 mg/dL (ref 70–99)
Potassium: 3.2 mEq/L — ABNORMAL LOW (ref 3.5–5.1)
Total Bilirubin: 0.3 mg/dL (ref 0.3–1.2)
Total Protein: 6.8 g/dL (ref 6.0–8.3)

## 2013-03-28 LAB — CBC WITH DIFFERENTIAL/PLATELET
Basophils Absolute: 0 10*3/uL (ref 0.0–0.1)
Basophils Relative: 1 % (ref 0–1)
HCT: 37.3 % — ABNORMAL LOW (ref 39.0–52.0)
Hemoglobin: 13.1 g/dL (ref 13.0–17.0)
Lymphocytes Relative: 28 % (ref 12–46)
MCV: 89.4 fL (ref 78.0–100.0)
Monocytes Absolute: 0.3 10*3/uL (ref 0.1–1.0)
Monocytes Relative: 4 % (ref 3–12)
Neutro Abs: 4.8 10*3/uL (ref 1.7–7.7)
Neutrophils Relative %: 65 % (ref 43–77)
RBC: 4.17 MIL/uL — ABNORMAL LOW (ref 4.22–5.81)
RDW: 13.3 % (ref 11.5–15.5)
WBC: 7.4 10*3/uL (ref 4.0–10.5)

## 2013-03-28 MED ORDER — OXYCODONE-ACETAMINOPHEN 7.5-325 MG PO TABS: 1.0000 | ORAL_TABLET | ORAL | Status: DC | PRN

## 2013-03-28 MED ORDER — ONDANSETRON HCL 4 MG/2ML IJ SOLN
4.0000 mg | Freq: Once | INTRAMUSCULAR | Status: AC
  Administered 2013-03-28: 4 mg via INTRAVENOUS
  Filled 2013-03-28: qty 2

## 2013-03-28 MED ORDER — SODIUM CHLORIDE 0.9 % IV BOLUS (SEPSIS)
1000.0000 mL | Freq: Once | INTRAVENOUS | Status: AC
  Administered 2013-03-28: 1000 mL via INTRAVENOUS

## 2013-03-28 MED ORDER — MORPHINE SULFATE 4 MG/ML IJ SOLN
4.0000 mg | Freq: Once | INTRAMUSCULAR | Status: AC
  Administered 2013-03-28: 4 mg via INTRAVENOUS
  Filled 2013-03-28: qty 1

## 2013-03-28 MED ORDER — POTASSIUM CHLORIDE CRYS ER 20 MEQ PO TBCR
20.0000 meq | EXTENDED_RELEASE_TABLET | Freq: Once | ORAL | Status: DC
  Filled 2013-03-28: qty 1

## 2013-03-28 NOTE — ED Provider Notes (Signed)
CSN: [id number]     Arrival date & time 03/28/13  0252 History   First MD Initiated Contact with Patient 03/28/13 0302     Chief Complaint  Patient presents with  . Abdominal Pain   (Consider location/radiation/quality/duration/timing/severity/associated sxs/prior Treatment) HPI Comments: Patient with a history of chronic pancreatitis due to  alcoholism.  States, that he had drank alcohol tonight, and developed right upper, right lower quadrant pain, and diarrhea consistent with his pancreatitis flares Patient states he is out of his Percocet since yesterday.  He is followed at a pain clinic.  Patient is a 52 y.o. male presenting with abdominal pain. The history is provided by the patient.  Abdominal Pain Pain location:  RUQ and RLQ Pain quality: aching and sharp   Pain radiates to:  Does not radiate Pain severity:  Moderate Onset quality:  Sudden Timing:  Constant Progression:  Worsening Chronicity:  Recurrent Context: alcohol use   Relieved by:  None tried Exacerbated by: Alcohol. Ineffective treatments:  None tried Associated symptoms: diarrhea and nausea   Associated symptoms: no constipation, no fever and no vomiting   Risk factors: alcohol abuse     Past Medical History  Diagnosis Date  . Chronic pancreatitis   . Psoriasis   . Chronic pancreatitis   . Psoriasis     extensive, responds to strong steroids, unable to start molecular therapies due to cost  . Depression   . Hyperlipidemia   . Heme positive stool     neg colonoscopies 11/2008 and 01/2008  . Duodenitis     by EGD 1/07  . RUE weakness     2/2 cervical spondylosis  . Emphysema     unclear diagnosis noted in previous EMR  . Smoking   . History of alcohol abuse    Past Surgical History  Procedure Laterality Date  . Tympanoplasty     Family History  Problem Relation Age of Onset  . Diabetes Mother   . Heart disease Father   . Cancer Father     ?kidney or liver cancer   History  Substance Use  Topics  . Smoking status: Current Every Day Smoker -- 1.00 packs/day for 15 years    Types: Cigarettes  . Smokeless tobacco: Not on file     Comment: PATIENT HAS CUT DOWN ON THE AMOUNT HE SMOKES  . Alcohol Use: No     Comment:  quit 2008    Review of Systems  Constitutional: Negative for fever.  Gastrointestinal: Positive for nausea, abdominal pain and diarrhea. Negative for vomiting and constipation.  Neurological: Negative for dizziness.  All other systems reviewed and are negative.    Allergies  Review of patient's allergies indicates no known allergies.  Home Medications   Current Outpatient Rx  Name  Route  Sig  Dispense  Refill  . Multiple Vitamin (MULTIVITAMIN WITH MINERALS) TABS   Oral   Take 1 tablet by mouth daily.         Marland Kitchen PANCRELIPASE, LIP-PROT-AMYL, PO   Oral   Take 5 capsules by mouth 3 (three) times daily with meals.         Marland Kitchen oxyCODONE-acetaminophen (PERCOCET) 7.5-325 MG per tablet   Oral   Take 1 tablet by mouth every 4 (four) hours as needed for pain.   8 tablet   0    BP 122/79  Pulse 96  Resp 21  SpO2 99% Physical Exam  Vitals reviewed. Constitutional: He appears well-developed and well-nourished.  HENT:  Head:  Normocephalic and atraumatic.  Eyes: Pupils are equal, round, and reactive to light.  Neck: Normal range of motion.  Cardiovascular: Normal rate and regular rhythm.   Pulmonary/Chest: Breath sounds normal.  Abdominal: Soft. Bowel sounds are normal. He exhibits no distension. There is tenderness in the right upper quadrant and right lower quadrant.    ED Course  Procedures (including critical care time) Labs Review Labs Reviewed  CBC WITH DIFFERENTIAL - Abnormal; Notable for the following:    RBC 4.17 (*)    HCT 37.3 (*)    All other components within normal limits  COMPREHENSIVE METABOLIC PANEL - Abnormal; Notable for the following:    Potassium 3.2 (*)    All other components within normal limits  LIPASE, BLOOD    Imaging Review No results found.  MDM   1. Chronic pancreatitis   2. Alcohol abuse    Patient's labs are unremarkable.  His pain is gone from a 10-6.  He was supplemented with 20 mEq of potassium for his potassium level of 3.2.  His been given a prescription for 8 tablets of oxycodone 7.5 mg to bridge him until he can see his primary care physician, as well as his pain clinic on Monday patient in sentences a one time proposition     Arman Filter, NP 03/28/13 0556  Arman Filter, NP 03/28/13 [phone]

## 2013-03-28 NOTE — ED Provider Notes (Signed)
Medical screening examination/treatment/procedure(s) were performed by non-physician practitioner and as supervising physician I was immediately available for consultation/collaboration.  Sunnie Nielsen, MD 03/28/13 [phone]

## 2013-03-28 NOTE — ED Notes (Signed)
Pt has hx of pancreatis. Pt states that his pain got worse today and therefore he is here for relief. Pt states that he is out of percocet. Pt also seen at pain clinic.

## 2014-03-09 ENCOUNTER — Encounter: Payer: Self-pay | Admitting: Gastroenterology

## 2016-09-02 ENCOUNTER — Emergency Department (HOSPITAL_COMMUNITY): Payer: Medicaid Other

## 2016-09-02 ENCOUNTER — Encounter (HOSPITAL_COMMUNITY): Payer: Self-pay | Admitting: Emergency Medicine

## 2016-09-02 ENCOUNTER — Emergency Department (HOSPITAL_COMMUNITY)
Admission: EM | Admit: 2016-09-02 | Discharge: 2016-09-02 | Disposition: A | Discharge status: Home / Self Care | Payer: Medicaid Other | Attending: Emergency Medicine | Admitting: Emergency Medicine

## 2016-09-02 DIAGNOSIS — J449 Chronic obstructive pulmonary disease, unspecified: Secondary | ICD-10-CM | POA: Insufficient documentation

## 2016-09-02 DIAGNOSIS — R1013 Epigastric pain: Secondary | ICD-10-CM

## 2016-09-02 DIAGNOSIS — F17210 Nicotine dependence, cigarettes, uncomplicated: Secondary | ICD-10-CM | POA: Insufficient documentation

## 2016-09-02 DIAGNOSIS — K861 Other chronic pancreatitis: Secondary | ICD-10-CM | POA: Diagnosis not present

## 2016-09-02 LAB — LIPASE, BLOOD: LIPASE: 11 U/L (ref 11–51)

## 2016-09-02 LAB — COMPREHENSIVE METABOLIC PANEL
ALT: 15 U/L — AB (ref 17–63)
AST: 28 U/L (ref 15–41)
Albumin: 4.3 g/dL (ref 3.5–5.0)
Alkaline Phosphatase: 92 U/L (ref 38–126)
Anion gap: 10 (ref 5–15)
BUN: 14 mg/dL (ref 6–20)
CO2: 22 mmol/L (ref 22–32)
CREATININE: 0.77 mg/dL (ref 0.61–1.24)
Calcium: 9.6 mg/dL (ref 8.9–10.3)
Chloride: 106 mmol/L (ref 101–111)
GFR calc Af Amer: >60 mL/min (ref >60)
GFR calc non Af Amer: >60 mL/min (ref >60)
Glucose, Bld: 104 mg/dL — ABNORMAL HIGH (ref 65–99)
Potassium: 4.1 mmol/L (ref 3.5–5.1)
SODIUM: 138 mmol/L (ref 135–145)
Total Bilirubin: 0.6 mg/dL (ref 0.3–1.2)
Total Protein: 7.4 g/dL (ref 6.5–8.1)

## 2016-09-02 LAB — CBC
HCT: 42.4 % (ref 39.0–52.0)
Hemoglobin: 14.2 g/dL (ref 13.0–17.0)
MCH: 30.7 pg (ref 26.0–34.0)
MCHC: 33.5 g/dL (ref 30.0–36.0)
MCV: 91.8 fL (ref 78.0–100.0)
Platelets: 196 10*3/uL (ref 150–400)
RBC: 4.62 MIL/uL (ref 4.22–5.81)
RDW: 13.1 % (ref 11.5–15.5)
WBC: 6.8 10*3/uL (ref 4.0–10.5)

## 2016-09-02 MED ORDER — OXYCODONE-ACETAMINOPHEN 7.5-325 MG PO TABS: 1.0000 | ORAL_TABLET | ORAL | 0 refills | Status: DC | PRN

## 2016-09-02 MED ORDER — HYDROMORPHONE HCL 2 MG/ML IJ SOLN
1.0000 mg | Freq: Once | INTRAMUSCULAR | Status: AC
  Administered 2016-09-02: 1 mg via INTRAVENOUS
  Filled 2016-09-02: qty 1

## 2016-09-02 MED ORDER — SODIUM CHLORIDE 0.9 % IV BOLUS (SEPSIS)
1000.0000 mL | Freq: Once | INTRAVENOUS | Status: AC
  Administered 2016-09-02: 1000 mL via INTRAVENOUS

## 2016-09-02 MED ORDER — IOPAMIDOL (ISOVUE-300) INJECTION 61%
INTRAVENOUS | Status: AC
  Administered 2016-09-02: 100 mL
  Filled 2016-09-02: qty 100

## 2016-09-02 MED ORDER — ONDANSETRON HCL 4 MG/2ML IJ SOLN
4.0000 mg | Freq: Once | INTRAMUSCULAR | Status: AC
  Administered 2016-09-02: 4 mg via INTRAVENOUS
  Filled 2016-09-02: qty 2

## 2016-09-02 NOTE — ED Provider Notes (Signed)
[facility]-EMERGENCY DEPT Provider Note   CSN: [id number] Arrival date & time: 09/02/16  1042     History   Chief Complaint Chief Complaint  Patient presents with  . Pancreatitis    HPI [name] is a 56 y.o. male.  HPI 56 year old Caucasian male past medical history significant for depression, chronic pancreatitis, pancreatic mass, psoriasis, emphysema, COPD presents to the ED today with complaints of epigastric pain. Patient states this pain is similar to his chronic pancreatitis pain. He is on chronic pain medicine at home however he ran out 2 days ago when his symptoms started.  Describes the pain in the epigastric region of his abdomen that radiates to his back. Typical of his pancreatitis pain. Endorses nausea but denies any emesis. Denies any diarrhea. Denies any melena or hematochezia. Denies any alcohol use. States that he quit drinking approximately 10 years ago. He is a chronic smoker though. States he is usually able to control his pain with his pain medicine at home. However he cannot get his medicines filled until tomorrow. Has not had an acute attack for approximately 3 years. Last CAT scan was 2 years ago. Does note that he's had a possible mass on his pancreas and they have been watching. Patient denies any chest pain, shortness of breath, fever, chills, lightheadedness, dizziness, cough, urinary symptoms, change in bowel habits, paresthesias. Past Medical History:  Diagnosis Date  . Chronic pancreatitis (HCC)   . Chronic pancreatitis (HCC)   . Depression   . Duodenitis    by EGD 1/07  . Emphysema    unclear diagnosis noted in previous EMR  . Heme positive stool    neg colonoscopies 11/2008 and 01/2008  . History of alcohol abuse   . Hyperlipidemia   . Psoriasis   . Psoriasis    extensive, responds to strong steroids, unable to start molecular therapies due to cost  . RUE weakness    2/2 cervical spondylosis  . Smoking     Patient Active Problem List   Diagnosis Date Noted  . HYPERKALEMIA 02/20/2010  . PSORIASIS 02/16/2009  . ABDOMINAL PAIN, CHRONIC 11/22/2008  . CHRONIC PANCREATITIS 04/13/2008  . EROSIVE GASTRITIS 02/24/2008  . BLOOD IN STOOL, OCCULT 01/18/2008  . MELENA, HX OF 11/26/2007  . DYSLIPIDEMIA 08/13/2007  . TOBACCO ABUSE 04/23/2007    Past Surgical History:  Procedure Laterality Date  . TYMPANOPLASTY         Home Medications    Prior to Admission medications   Medication Sig Start Date End Date Taking? Authorizing Provider  Multiple Vitamin (MULTIVITAMIN WITH MINERALS) TABS Take 1 tablet by mouth daily.    Historical Provider, MD  oxyCODONE-acetaminophen (PERCOCET) 7.5-325 MG per tablet Take 1 tablet by mouth every 4 (four) hours as needed for pain. 03/28/13   Earley FavorGail Schulz, NP  PANCRELIPASE, LIP-PROT-AMYL, PO Take 5 capsules by mouth 3 (three) times daily with meals.    Historical Provider, MD    Family History Family History  Problem Relation Age of Onset  . Diabetes Mother   . Heart disease Father   . Cancer Father     ?kidney or liver cancer    Social History Social History  Substance Use Topics  . Smoking status: Current Every Day Smoker    Packs/day: 1.00    Years: 15.00    Types: Cigarettes  . Smokeless tobacco: Not on file     Comment: PATIENT HAS CUT DOWN ON THE AMOUNT HE SMOKES  . Alcohol use No  Comment:  quit 2008     Allergies   Patient has no known allergies.   Review of Systems Review of Systems  Constitutional: Negative for activity change, appetite change, chills and fever.  Respiratory: Negative for cough and shortness of breath.   Cardiovascular: Negative for chest pain and palpitations.  Gastrointestinal: Positive for abdominal pain (epigastric) and nausea. Negative for blood in stool, diarrhea and vomiting.  Genitourinary: Negative for dysuria, frequency, hematuria and urgency.  Musculoskeletal: Negative for back pain, neck pain and neck stiffness.  Skin: Positive for  rash (chronic psoriasis.). Negative for wound.  Neurological: Negative for dizziness, syncope, weakness, light-headedness, numbness and headaches.     Physical Exam Updated Vital Signs BP 103/74 (BP Location: Left Arm)   Pulse 93   Temp 97.7 F (36.5 C) (Oral)   Resp 18   Ht 5\' 8"  (1.727 m)   Wt 59 kg   SpO2 94%   BMI 19.77 kg/m   Physical Exam  Constitutional: He is oriented to person, place, and time. He appears well-developed and well-nourished. No distress.  Nontoxic-appearing.  HENT:  Head: Normocephalic and atraumatic.  Mouth/Throat: Oropharynx is clear and moist.  Eyes: Conjunctivae and EOM are normal. Pupils are equal, round, and reactive to light. Right eye exhibits no discharge. Left eye exhibits no discharge. No scleral icterus.  Neck: Normal range of motion. Neck supple. No JVD present. No thyromegaly present.  Cardiovascular: Normal rate, regular rhythm, normal heart sounds and intact distal pulses.  Exam reveals no gallop and no friction rub.   No murmur heard. Pulmonary/Chest: Effort normal. No respiratory distress. He has no wheezes. He has no rales. He exhibits no tenderness.  Abdominal: Soft. Bowel sounds are normal. He exhibits no distension and no ascites. There is no hepatosplenomegaly. There is tenderness in the epigastric area and left upper quadrant. There is no rigidity, no rebound, no guarding, no CVA tenderness, no tenderness at McBurney's point and negative Murphy's sign.  Negative rosvings sign.  Musculoskeletal: Normal range of motion.  No lower extremiity edema  Lymphadenopathy:    He has no cervical adenopathy.  Neurological: He is alert and oriented to person, place, and time.  Skin: Skin is warm and dry. Capillary refill takes less than 2 seconds.  No jaundice  Nursing note and vitals reviewed.    ED Treatments / Results  Labs (all labs ordered are listed, but only abnormal results are displayed) Labs Reviewed  COMPREHENSIVE METABOLIC  PANEL - Abnormal; Notable for the following:       Result Value   Glucose, Bld 104 (*)    ALT 15 (*)    All other components within normal limits  LIPASE, BLOOD  CBC    EKG  EKG Interpretation None       Radiology Ct Abdomen Pelvis W Contrast  Result Date: 09/02/2016 CLINICAL DATA:  Mid abdominal pain EXAM: CT ABDOMEN AND PELVIS WITH CONTRAST TECHNIQUE: Multidetector CT imaging of the abdomen and pelvis was performed using the standard protocol following bolus administration of intravenous contrast. CONTRAST:  100 mL ISOVUE-300 IOPAMIDOL (ISOVUE-300) INJECTION 61% COMPARISON:  CT abdomen pelvis 11/23/2010 FINDINGS: Lower chest: Severe emphysema with large bulla within the anterior right lung base. Hepatobiliary: Hypoattenuation of the liver relative to the spleen indicates hepatic steatosis. Normal gallbladder. Pancreas: There is calcification within the pancreatic head. There is a faint hyperattenuating focus at the most distal aspect of the pancreatic tail, measuring 7 mm (series 3, image 23). This lesion was not visible  on the prior study. Spleen: Normal. Adrenals/Urinary Tract: Normal adrenal glands. No hydronephrosis or solid renal mass. Stomach/Bowel: No abnormal bowel dilatation. No bowel wall thickening or adjacent fat stranding to indicate acute inflammation. No abdominal fluid collection. Normal appendix. Vascular/Lymphatic: There is atherosclerotic calcification of the non aneurysmal abdominal aorta. No abdominal or pelvic adenopathy. Reproductive: Normal prostate and seminal vesicles. Musculoskeletal: Sclerotic focus in the left femoral head is likely a benign enostosis. No lytic or blastic lesion. Normal visualized extrathoracic and extraperitoneal soft tissues. Other: No contributory non-categorized findings. IMPRESSION: 1. No acute abnormality of the abdomen or pelvis. 2. Faint hyperattenuating focus within the pancreatic tail may be a small pancreatic mass, such as a pancreatic  neuroendocrine tumor. Further evaluation with abdominal MRI with and without contrast might be helpful. 3. Calcifications within the pancreatic head are consistent with chronic pancreatitis. 4. Aortic atherosclerosis. 5. Severe emphysema. Electronically Signed   By: Deatra Robinson M.D.   On: 09/02/2016 15:32    Procedures Procedures (including critical care time)  Medications Ordered in ED Medications  sodium chloride 0.9 % bolus 1,000 mL (0 mLs Intravenous Stopped 09/02/16 1650)  HYDROmorphone (DILAUDID) injection 1 mg (1 mg Intravenous Given 09/02/16 1325)  ondansetron (ZOFRAN) injection 4 mg (4 mg Intravenous Given 09/02/16 1324)  iopamidol (ISOVUE-300) 61 % injection (100 mLs  Contrast Given 09/02/16 1449)  HYDROmorphone (DILAUDID) injection 1 mg (1 mg Intravenous Given 09/02/16 1558)     Initial Impression / Assessment and Plan / ED Course  I have reviewed the triage vital signs and the nursing notes.  Pertinent labs & imaging results that were available during my care of the patient were reviewed by me and considered in my medical decision making (see chart for details).     Pt presents with [facility] sig for chronic pancreatitis who states this is a similar attack who ran out of pain medications and can not get meds filled until tomorrow who has pcp to follow up for epigastric pain. Lipase was normal. Liver enzymes normal. No leukocytosis. Labs wnl limit that does not suggest acute pancreatitis flare. Pt has not had a ct scan in 3 years. CT was performed that showed no acute changes. Chronic pancreatitis noticed. Normal gallbladder. Possible mass identified on tail of pancreas. Pt has history of same. Recommend MRI outpatient. Talked with [facility] who states this is something that can be followed up out paitent have given him their number to establish care as he does not have a gi doctor. Fluids were given and pain was controlled. Patient is nontoxic, nonseptic appearing, in no apparent  distress.  Patient's pain and other symptoms adequately managed in emergency department.  Fluid bolus given.  Labs, imaging and vitals reviewed.  Patient does not meet the SIRS or Sepsis criteria.  On repeat exam patient does not have a surgical abdomin and there are no peritoneal signs.  No indication of appendicitis, bowel obstruction, bowel perforation, cholecystitis, diverticulitis. Will give 4 pain medicine until he can get his prescription filled tomorrow.  Patient discharged home with symptomatic treatment and given strict instructions for follow-up with their primary care physician.  I have also discussed reasons to return immediately to the ER.  Patient expresses understanding and agrees with plan.      Final Clinical Impressions(s) / ED Diagnoses   Final diagnoses:  Epigastric pain  Chronic pancreatitis, unspecified pancreatitis type [facility])    New Prescriptions Discharge Medication List as of 09/02/2016  4:44 PM  TREVIS EDEN, PA-C 09/03/16 1050    Gwyneth Sprout, MD 09/03/16 [phone]

## 2016-09-02 NOTE — ED Notes (Signed)
Unable to obtain blood

## 2016-09-02 NOTE — Discharge Instructions (Signed)
Please continue your pain medication. Please follow-up with a gastroenterologist. I have discussed her CT findings with the. Return if you're unable to control your symptoms or develop any worsening nausea or vomiting.

## 2016-09-02 NOTE — ED Triage Notes (Signed)
Pt reports hx of chronic pancreatitis, reports worsening nausea and abdominal pain 2 days ago. Pt a/ox4, resp e/u, nad.

## 2016-09-10 ENCOUNTER — Other Ambulatory Visit: Payer: Self-pay | Admitting: Gastroenterology

## 2016-09-10 DIAGNOSIS — K860 Alcohol-induced chronic pancreatitis: Secondary | ICD-10-CM | POA: Diagnosis not present

## 2016-09-10 DIAGNOSIS — R9389 Abnormal findings on diagnostic imaging of other specified body structures: Secondary | ICD-10-CM

## 2016-09-10 DIAGNOSIS — R938 Abnormal findings on diagnostic imaging of other specified body structures: Secondary | ICD-10-CM | POA: Diagnosis not present

## 2016-09-10 DIAGNOSIS — K219 Gastro-esophageal reflux disease without esophagitis: Secondary | ICD-10-CM | POA: Diagnosis not present

## 2016-09-21 ENCOUNTER — Ambulatory Visit
Admission: RE | Admit: 2016-09-21 | Discharge: 2016-09-21 | Disposition: A | Discharge status: Home / Self Care | Payer: Medicaid Other | Source: Ambulatory Visit | Attending: Gastroenterology | Admitting: Gastroenterology

## 2016-09-21 DIAGNOSIS — R9389 Abnormal findings on diagnostic imaging of other specified body structures: Secondary | ICD-10-CM

## 2016-09-21 MED ORDER — GADOBENATE DIMEGLUMINE 529 MG/ML IV SOLN
15.0000 mL | Freq: Once | INTRAVENOUS | Status: AC | PRN
  Administered 2016-09-21: 11 mL via INTRAVENOUS

## 2016-10-03 ENCOUNTER — Ambulatory Visit: Payer: Self-pay | Admitting: Surgery

## 2016-10-03 NOTE — H&P (Signed)
[name] 10/03/2016 11:46 AM Location: [facility] Patient #: [id number] DOB: 02-Apr-1961 Undefined / Language: [place] / Race: White Male  History of Present Illness [name] MD; 10/03/2016 2:14 PM) The patient is a 56 year old male who presents with pancreatitis. Note for "Chronic pancreatitis": ` ` ` Patient sent for surgical consultation at the request of his gastroenterologist, Dr. Georgiann Cocker with [facility] gastroenterology. Concern for postprandial right upper quadrant pain and benefit of cholecystectomy in the setting of chronic pancreatitis.  56 year old smoking male. He comes today with his mother. He is on disability for numerous health and pain issues. Chronic pain. Chronic pancreatitis seems to be mainly isolated to the head of the pancreas. 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 Has had intermittent episodes of right upper quadrant abdominal pain for many years. Attacks can be severe. 8-9 out of 10. A point where he has chronic soreness now. Diffley word after he's eating heavier foods such as spicy foods. He no evidence of any gallstones by ultrasound or MRI/MRCP. Has chronic constipation moves his bowels about twice a week. Followed by gastroenterology intermittently. Trying to reestablish outpatient care given improvement in his insurance/financial status. He is also on disability. Establish primary care physician. Patient claims he can walk maybe a block before he has to stop. Claims he has COPD. He is tried to quit smoking but he could not. He is aware of pancreatitis. He's been absent alcohol. Looks like radiographically that been stable for the past 7 years or so.  However the attack or pains persistent are becoming more intense now. He does have a history of chronic heartburn and reflux. He had esophagitis and gastritis based on prior endoscopies many years ago. He is not on any antacid medication at this time. Just  occasionally takes Tums. Tries to avoid nonsteroidals. Patient also has rather significant psoriasis. He is been taking topical moisturizers. He recalls being told by someone that he is not allowed to take any more aggressive or systemic immunosuppressive medications for his psoriasis based on his chronic pancreatitis. He moves his bowels maybe twice a week. Not any bowel regimen at all.  Because of the persistent upper abdominal pain with component suspicious for biliary colic, surgical consultation requested to see if patient would benefit from cholecystectomy.   Past Surgical History (Janette Ranson, CMA; 10/03/2016 11:46 AM) No pertinent past surgical history  Diagnostic Studies History (Janette Ranson, CMA; 10/03/2016 11:46 AM) Colonoscopy 1-5 years ago  Allergies (Janette Ranson, CMA; 10/03/2016 11:53 AM) No Known Drug Allergies 10/03/2016 Allergies Reconciled  Medication History (Janette Ranson, CMA; 10/03/2016 11:53 AM) Oxycodone-Acetaminophen (7.5-325MG  Tablet, Oral) Active.  Social History (Janette Ranson, CMA; 10/03/2016 11:46 AM) Alcohol use Remotely quit alcohol use. Tobacco use Current every day smoker.  Family History (Janette Ranson, CMA; 10/03/2016 11:46 AM) Alcohol Abuse Father. Arthritis Mother. Depression Mother. Thyroid problems Mother.  Other Problems (Janette Ranson, CMA; 10/03/2016 11:46 AM) Alcohol Abuse Back Pain Chronic Obstructive Lung Disease Depression Gastric Ulcer Pancreatitis     Review of Systems (Janette Ranson CMA; 10/03/2016 11:46 AM) General Present- Appetite Loss. Not Present- Chills, Fatigue, Fever, Night Sweats, Weight Gain and Weight Loss. Skin Present- Rash. Not Present- Change in Wart/Mole, Dryness, Hives, Jaundice, New Lesions, Non-Healing Wounds and Ulcer. HEENT Not Present- Earache, Hearing Loss, Hoarseness, Nose Bleed, Oral Ulcers, Ringing in the Ears, Seasonal Allergies, Sinus Pain, Sore Throat, Visual  Disturbances, Wears glasses/contact lenses and Yellow Eyes. Respiratory Present- Difficulty Breathing. Not Present- Bloody sputum, Chronic Cough,  Snoring and Wheezing. Breast Not Present- Breast Mass, Breast Pain, Nipple Discharge and Skin Changes. Cardiovascular Present- Shortness of Breath. Not Present- Chest Pain, Difficulty Breathing Lying Down, Leg Cramps, Palpitations, Rapid Heart Rate and Swelling of Extremities. Gastrointestinal Present- Abdominal Pain, Indigestion and Nausea. Not Present- Bloating, Bloody Stool, Change in Bowel Habits, Chronic diarrhea, Constipation, Difficulty Swallowing, Excessive gas, Gets full quickly at meals, Hemorrhoids, Rectal Pain and Vomiting. Male Genitourinary Not Present- Blood in Urine, Change in Urinary Stream, Frequency, Impotence, Nocturia, Painful Urination, Urgency and Urine Leakage.  Vitals (Janette Ranson CMA; 10/03/2016 11:54 AM) 10/03/2016 11:53 AM Weight: 138 lb Height: 68in Body Surface Area: 1.75 m Body Mass Index: 20.98 kg/m  Temp.: 62F  Pulse: 97 (Regular)  BP: 100/68 (Sitting, Left Arm, Standard)      Assessment & Plan [name] MD; 10/03/2016 2:15 PM)  CHRONIC CHOLECYSTITIS WITHOUT CALCULUS (K81.1) Impression: History and physical suspicious for biliary colic in a patient with no definite gallstones.  Would like to get a HIDA scan with gallbladder ejection fraction. See if there is some objective evidence of biliary dyskinesia/colic.  Unless that is completely normal, I think it is reasonable to offer cholecystectomy. Reasonable outpatient single site laparoscopic approach. I think his biggest operative risk is that does not solve his abdominal complaints. I did try and frame expectations in that I think he has some chronic heartburn, gastritis, constipation, abdominal pain. I'm skeptical that cholecystectomy is solve all of that. However his postprandial right upper quadrant symptoms seems more consistent with  biliary dyskinesia/colic and chronic pancreatitis per se.  Consider dermatology evaluation for significant psoriasis. He recalls being told that systemic treatment is not a good idea with his pancreatitis. We'll defer to them.  Would recommend he controls constipation, consider trial of proton pump inhibitors given history of gastritis, see if that helps. If not, consider repeat endoscopy. I am skeptical that explains all his symptoms.  Would like to get medical and cardiac clearance given his poor exercise tolerance. Patient recalls that his primary care physician is ordering an echocardiogram on him. We'll see if we get cardiology to have further insights on the that. We'll hold off on any request for pulmonary clearance unless medicine or cardiology is concerned that formal pulmonary evaluation is warranted.  Current Plans You are being scheduled for surgery- Our schedulers will call you.  You should hear from our office's scheduling department within 5 working days about the location, date, and time of surgery. We try to make accommodations for patient's preferences in scheduling surgery, but sometimes the OR schedule or the surgeon's schedule prevents [place] from making those accommodations.  If you have not heard from our office [phone]) in 5 working days, call the office and ask for your surgeon's nurse.  If you have other questions about your diagnosis, plan, or surgery, call the office and ask for your surgeon's nurse.  Written instructions provided Pt Education - Pamphlet Given - Laparoscopic Gallbladder Surgery: discussed with patient and provided information. The anatomy & physiology of hepatobiliary & pancreatic function was discussed. The pathophysiology of gallbladder dysfunction was discussed. Natural history risks without surgery was discussed. I feel the risks of no intervention will lead to serious problems that outweigh the operative risks; therefore, I recommended  cholecystectomy to remove the pathology. I explained laparoscopic techniques with possible need for an open approach. Probable cholangiogram to evaluate the bilary tract was explained as well.  Risks such as bleeding, infection, abscess, leak, injury to other organs, need  for further treatment, heart attack, death, and other risks were discussed. I noted a good likelihood this will help address the problem. Possibility that this will not correct all abdominal symptoms was explained. Goals of post-operative recovery were discussed as well. We will work to minimize complications. An educational handout further explaining the pathology and treatment options was given as well. Questions were answered. The patient expresses understanding & wishes to proceed with surgery.  Pt Education - CCS Laparosopic Post Op HCI (Lenell Mcconnell) Pt Education - Laparoscopic Cholecystectomy: gallbladder I recommended obtaining preoperative cardiac clearance. I am concerned about the health of the patient and the ability to tolerate the operation. Therefore, we will request clearance by cardiology to better assess operative risk & see if a reevaluation, further workup, etc is needed. Also recommendations on how medications such as for anticoagulation and blood pressure should be managed/held/restarted after surgery. I recommended obtaining preoperative medical clearance. I am concerned about the health of the patient and the ability to tolerate the operation. Therefore, we will request clearance by medicine to better assess operative risk & see if a reevaluation, further workup, etc is needed. Also recommendations on how medications should be managed/held/restarted after surgery. HEARTBURN (R12) Impression: History of esophagitis and gastritis with persistent heartburn.  Consider trial of over-the-counter omeprazole twice a day. See if that helps improve things. I am skeptical explains his episodes of biliary colic, but  might help with some his abdominal pain. Defer to his gastroenterologist.  PSORIASIS (L40.9) Impression: I think he would benefit from medical therapy. He is more interested in just Eucerin. He recalls being told more aggressive regimens would go gets his chronic pancreatitis. I'm somewhat skeptical that that worth considering with a dermatology evaluation. Defer to primary care physician.  CHRONIC ALCOHOLIC PANCREATITIS (K86.0) Impression: Question of pancreatic insufficiency. Remains abstinent on alpha call soft without stable.  We'll offer cholecystectomy in hopes that that can help further prevent exacerbation his pancreatitis. Guarded in that it would not solve all problems.  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 Reassuring. A few small tiny cysts on the pancreatic head. No other pancreatic lesions of concern. Stable chronic pancreatitis isolated mainly to the head.  CHRONIC CONSTIPATION (K59.09)  Current Plans Pt Education - CCS Constipation (AT) Pt Education - CCS Good Bowel Health (Raivyn Kabler) TOBACCO ABUSE (Z72.0) Impression: I strongly recommended he quit smoking. He noted he's had family members passed away from complications related to smoking. He had he's convinced he cannot quit. Doesn't sound like he seriously tried with regimen are gone to support systems. He has tolerated patches when he is in the hospital.  Current Plans Pt Education - CCS STOP SMOKING!  [name], M.D., F.A.C.S. Gastrointestinal and Minimally Invasive Surgery [facility] [address], [address] [place], [place] [address] [phone] Main / Paging

## 2016-10-07 ENCOUNTER — Other Ambulatory Visit (HOSPITAL_COMMUNITY): Payer: Self-pay | Admitting: Surgery

## 2016-10-07 ENCOUNTER — Telehealth: Payer: Self-pay

## 2016-10-07 DIAGNOSIS — K811 Chronic cholecystitis: Secondary | ICD-10-CM

## 2016-10-07 NOTE — Telephone Encounter (Signed)
SENT NOTES TO SCHEDULING 

## 2016-10-11 ENCOUNTER — Encounter (HOSPITAL_COMMUNITY)
Admission: RE | Admit: 2016-10-11 | Discharge: 2016-10-11 | Disposition: A | Discharge status: Home / Self Care | Payer: Medicaid Other | Source: Ambulatory Visit | Attending: Surgery | Admitting: Surgery

## 2016-10-11 DIAGNOSIS — K811 Chronic cholecystitis: Secondary | ICD-10-CM | POA: Diagnosis present

## 2016-10-11 MED ORDER — TECHNETIUM TC 99M MEBROFENIN IV KIT
5.0000 | PACK | Freq: Once | INTRAVENOUS | Status: AC | PRN
  Administered 2016-10-11: 5 via INTRAVENOUS

## 2016-10-14 ENCOUNTER — Encounter: Payer: Self-pay | Admitting: Internal Medicine

## 2016-10-14 ENCOUNTER — Ambulatory Visit (INDEPENDENT_AMBULATORY_CARE_PROVIDER_SITE_OTHER): Payer: Medicaid Other | Admitting: Internal Medicine

## 2016-10-14 ENCOUNTER — Encounter (INDEPENDENT_AMBULATORY_CARE_PROVIDER_SITE_OTHER): Payer: Self-pay

## 2016-10-14 VITALS — BP 120/76 | HR 100 | Ht 68.0 in | Wt 138.4 lb

## 2016-10-14 DIAGNOSIS — J42 Unspecified chronic bronchitis: Secondary | ICD-10-CM

## 2016-10-14 DIAGNOSIS — Z720 Tobacco use: Secondary | ICD-10-CM

## 2016-10-14 DIAGNOSIS — R0609 Other forms of dyspnea: Secondary | ICD-10-CM | POA: Diagnosis not present

## 2016-10-14 DIAGNOSIS — E785 Hyperlipidemia, unspecified: Secondary | ICD-10-CM | POA: Diagnosis not present

## 2016-10-14 DIAGNOSIS — Z01810 Encounter for preprocedural cardiovascular examination: Secondary | ICD-10-CM

## 2016-10-14 NOTE — Patient Instructions (Signed)
Your physician recommends that you continue on your current medications as directed. Please refer to the Current Medication list given to you today.  Your physician recommends that you return for lab work today (LIPIDS)  Please get your echocardiogram disc and bring to the office for Dr. Tenny Craw to review.    Further follow up will be determined after that time.

## 2016-10-15 ENCOUNTER — Telehealth: Payer: Self-pay | Admitting: Internal Medicine

## 2016-10-15 DIAGNOSIS — E785 Hyperlipidemia, unspecified: Secondary | ICD-10-CM

## 2016-10-15 LAB — LIPID PANEL
CHOLESTEROL TOTAL: 189 mg/dL (ref 100–199)
Chol/HDL Ratio: 6.5 ratio — ABNORMAL HIGH (ref 0.0–5.0)
HDL: 29 mg/dL — AB (ref >39)
TRIGLYCERIDES: 793 mg/dL — AB (ref 0–149)

## 2016-10-15 MED ORDER — ATORVASTATIN CALCIUM 40 MG PO TABS: 40.0000 mg | ORAL_TABLET | Freq: Every day | ORAL | 3 refills | Status: DC

## 2016-10-15 NOTE — Telephone Encounter (Signed)
-----   Message from Dietrich Pates V, MD sent at 10/15/2016  1:53 PM EDT ----- WOuld recomm lipitor 40 mg to lower cholesterol F/U lipids in 8 wks Watch sweets, carbs. (sweet tea, sugary foods)

## 2016-10-15 NOTE — Telephone Encounter (Signed)
Notes recorded by Lendon Ka, RN on 10/15/2016 at 2:55 PM EDT Spoke with patient. Reviewed results with him. He will start Lipitor and return for blood work on 12/12/16. Advised to reduce carbs.

## 2016-10-15 NOTE — Telephone Encounter (Signed)
New Message  ° ° ° °Pt is returning your call for test results °

## 2016-10-15 NOTE — Progress Notes (Signed)
Cardiology Office Note   Date:  10/15/2016   ID:  [name], DOB [birth year], MRN [id number]  PCP:  Jackie Plum, MD  Cardiologist:   Dietrich Pates, MD   Pt referred by Dr Michaell Cowing for preop cardiac eval     History of Present Illness: [name] is a 56 y.o. male with no known hx of CAD  Referred for preop risk stratification.  Pt with hsitory of tob use, EtOH use, COPD, chronic pancreatitis.  Being evaluated for choleycystectomy. The pt denies CP Does get SOB with activity.  Significant   Did get winded coming in form parking lot         Current Meds  Medication Sig  . oxyCODONE-acetaminophen (PERCOCET) 7.5-325 MG tablet Take 1 tablet by mouth every 4 (four) hours as needed.     Allergies:   Patient has no known allergies.   Past Medical History:  Diagnosis Date  . Chronic pancreatitis (HCC)   . Chronic pancreatitis (HCC)   . Depression   . Duodenitis    by EGD 1/07  . Emphysema    unclear diagnosis noted in previous EMR  . Heme positive stool    neg colonoscopies 11/2008 and 01/2008  . History of alcohol abuse   . Hyperlipidemia   . Psoriasis   . Psoriasis    extensive, responds to strong steroids, unable to start molecular therapies due to cost  . RUE weakness    2/2 cervical spondylosis  . Smoking     Past Surgical History:  Procedure Laterality Date  . TYMPANOPLASTY       Social History:  The patient  reports that he has been smoking Cigarettes.  He has a 15.00 pack-year smoking history. He has never used smokeless tobacco. He reports that he does not drink alcohol or use drugs.   QUit EtOH    Family History:  The patient's family history includes Cancer in his father; Diabetes in his mother; Heart disease in his father.    ROS:  Please see the history of present illness. All other systems are reviewed and  Negative to the above problem except as noted.    PHYSICAL EXAM: VS:  BP 120/76   Pulse 100   Ht  (1.727 m)   Wt 138 lb 6.4  oz (62.8 kg)   BMI 21.04 kg/m   Sats at rest 98%  Decrease to 93% with walking    GEN: Well nourished, well developed, in no acute distress  HEENT: normal  Neck: no JVD, carotid bruits, or masses Cardiac: RRR; no murmurs, rubs, or gallops,no edema  Respiratory:  clear to auscultation  Decreased airflow   GI: soft, nontender, nondistended, + BS  No hepatomegaly  MS: no deformity Moving all extremities   Skin: warm and dry, no rash Neuro:  Strength and sensation are intact Psych: euthymic mood, full affect   EKG:  EKG is not  ordered today. 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  The patient does not have concerns regarding medicines.  Signed, Dietrich Pates, MD  10/15/2016 8:34 AM    [facility] [address] [place], [place], [place]  [address] Phone: [phone]; Fax: [phone]

## 2016-10-18 NOTE — Addendum Note (Signed)
Addended by: Madalyn Rob A on: 10/18/2016 10:59 AM   Modules accepted: Orders

## 2016-10-25 ENCOUNTER — Telehealth: Payer: Self-pay | Admitting: Internal Medicine

## 2016-10-25 DIAGNOSIS — Z01818 Encounter for other preprocedural examination: Secondary | ICD-10-CM

## 2016-10-25 NOTE — Telephone Encounter (Signed)
New message     Request for surgical clearance:  What type of surgery is being performed?  Gallbladder removal 1. When is this surgery scheduled?  Pending clearance  2. Are there any medications that need to be held prior to surgery and how long? No.  However, PCP did echo and faxed it to Dr Tenny Crawoss.  Pt was seen in April.  Please fax clearance if he is now cleared  3. Name of physician performing surgery? Dr Michaell CowingGross  What is your office phone and fax number?  Fax [phone]

## 2016-10-25 NOTE — Telephone Encounter (Signed)
I do not see echo in EPIC.  Will contact PCP for copy.

## 2016-10-28 NOTE — Telephone Encounter (Signed)
Follow up  Pts mom voiced calling about Echo  Please f/u

## 2016-10-28 NOTE — Telephone Encounter (Signed)
Attempted to call the pts Mother back at contact information provided,  and no answer and automated VM picked-up stating "this number has been changed to a non-published number and no further information will be provided." Will route this message to Dr Tenny Crawoss and covering RN for further follow-up with the pt and Mother.

## 2016-10-29 NOTE — Telephone Encounter (Signed)
Echo report received from [facility].  Report scanned into patient's chart (10/11/16) for Dr. Tenny Crawoss to review.

## 2016-10-30 NOTE — Telephone Encounter (Signed)
Echo showed normal pumping function of heart MIld diastolic dysfunction I would recomm pt have Lexiscan myovue to eval for ischemia

## 2016-11-01 NOTE — Telephone Encounter (Signed)
Spoke with patient and informed Dr. Tenny Crawoss reviewed echo and that she recommends stress test prior to clearing for surgery.  He is aware a scheduler will be calling to set up the test.

## 2016-11-05 ENCOUNTER — Telehealth (HOSPITAL_COMMUNITY): Payer: Self-pay | Admitting: *Deleted

## 2016-11-05 NOTE — Telephone Encounter (Signed)
Patient given detailed instructions per Myocardial Perfusion Study Information Sheet for the test on 11/06/16 at 0715. Patient notified to arrive 15 minutes early and that it is imperative to arrive on time for appointment to keep from having the test rescheduled.  If you need to cancel or reschedule your appointment, please call the office within 24 hours of your appointment. Failure to do so may result in a cancellation of your appointment, and a $50 no show fee. Patient verbalized understanding.Kendell Gammon W     

## 2016-11-06 ENCOUNTER — Encounter (INDEPENDENT_AMBULATORY_CARE_PROVIDER_SITE_OTHER): Payer: Self-pay

## 2016-11-06 ENCOUNTER — Ambulatory Visit (HOSPITAL_COMMUNITY): Payer: Medicaid Other | Attending: Cardiovascular Disease

## 2016-11-06 DIAGNOSIS — Z01818 Encounter for other preprocedural examination: Secondary | ICD-10-CM

## 2016-11-06 LAB — MYOCARDIAL PERFUSION IMAGING
CHL CUP NUCLEAR SDS: 0
CHL CUP NUCLEAR SRS: 14
CHL CUP NUCLEAR SSS: 14
CHL CUP RESTING HR STRESS: 74 {beats}/min
LV sys vol: 20 mL
LVDIAVOL: 63 mL (ref 62–150)
Peak HR: 86 {beats}/min
RATE: 0.36
TID: 1.01

## 2016-11-06 MED ORDER — TECHNETIUM TC 99M TETROFOSMIN IV KIT
10.8000 | PACK | Freq: Once | INTRAVENOUS | Status: AC | PRN
  Administered 2016-11-06: 10.8 via INTRAVENOUS
  Filled 2016-11-06: qty 11

## 2016-11-06 MED ORDER — TECHNETIUM TC 99M TETROFOSMIN IV KIT
31.6000 | PACK | Freq: Once | INTRAVENOUS | Status: AC | PRN
  Administered 2016-11-06: 31.6 via INTRAVENOUS
  Filled 2016-11-06: qty 32

## 2016-11-06 MED ORDER — REGADENOSON 0.4 MG/5ML IV SOLN
0.4000 mg | Freq: Once | INTRAVENOUS | Status: AC
  Administered 2016-11-06: 0.4 mg via INTRAVENOUS

## 2016-11-07 ENCOUNTER — Telehealth: Payer: Self-pay | Admitting: *Deleted

## 2016-11-07 NOTE — Telephone Encounter (Signed)
Patient informed and copy sent to Dr. Karie SodaSteven Gross.

## 2016-11-07 NOTE — Telephone Encounter (Signed)
-----   Message from Pricilla RifflePaula Ross V, MD sent at 11/06/2016 10:19 PM EDT ----- Stress test is normal Pt is at low risk for major cardiac event with surgery  OK to proceed Please forward to surgeon

## 2016-11-22 NOTE — Progress Notes (Signed)
11-06-16 (EPIC) Surgical clearance from Dr. Tenny Crawoss under CV procedure, and 11-07-16 telephone encounter   11-06-16 [facility](EPIC) Stress 10-14-16 (EPIC) EKG 10-11-16 (EPIC) ECHO

## 2016-11-22 NOTE — Patient Instructions (Addendum)
Dreama SaaKenneth T Hammitt  11/22/2016   Your procedure is scheduled on: 11-28-16  [facility]  Entrance Take [place] Elevators to 3rd floor to  Short Stay Center at 09:15 AM.   Call this number if you have problems the morning of surgery [phone]   Remember: ONLY 1 PERSON MAY GO WITH YOU TO SHORT STAY TO GET  READY MORNING OF YOUR SURGERY.  Do not eat food or drink liquids :After Midnight.     Take these medicines the morning of surgery with A SIP OF WATER: Oxycodone-Acetaminophen (Percocet) as needed.                                You may not have any metal on your body including hair pins and              piercings  Do not wear jewelry, make-up, lotions, powders or perfumes, deodorant             Men may shave face and neck.   Do not bring valuables to the hospital. [facility] IS NOT             RESPONSIBLE   FOR VALUABLES.  Contacts, dentures or bridgework may not be worn into surgery.    Special Instructions: Driver [place] [phone]               Please read over the following fact sheets you were given: _____________________________________________________________________             [facility] - Preparing for [facility] Before surgery, you can play an important role.  Because skin is not sterile, your skin needs to be as free of germs as possible.  You can reduce the number of germs on your skin by washing with CHG (chlorahexidine gluconate) soap before surgery.  CHG is an antiseptic cleaner which kills germs and bonds with the skin to continue killing germs even after washing. Please DO NOT use if you have an allergy to CHG or antibacterial soaps.  If your skin becomes reddened/irritated stop using the CHG and inform your nurse when you arrive at Short Stay. Do not shave (including legs and underarms) for at least 48 hours prior to the first CHG shower.  You may shave your face/neck. Please follow these instructions carefully:  1.  Shower with  CHG Soap the night before surgery and the  morning of Surgery.  2.  If you choose to wash your hair, wash your hair first as usual with your  normal  shampoo.  3.  After you shampoo, rinse your hair and body thoroughly to remove the  shampoo.                           4.  Use CHG as you would any other liquid soap.  You can apply chg directly  to the skin and wash                       Gently with a scrungie or clean washcloth.  5.  Apply the CHG Soap to your body ONLY FROM THE NECK DOWN.   Do not use on face/ open  Wound or open sores. Avoid contact with eyes, ears mouth and genitals (private parts).                       Wash face,  Genitals (private parts) with your normal soap.             6.  Wash thoroughly, paying special attention to the area where your surgery  will be performed.  7.  Thoroughly rinse your body with warm water from the neck down.  8.  DO NOT shower/wash with your normal soap after using and rinsing off  the CHG Soap.                9.  Pat yourself dry with a clean towel.            10.  Wear clean pajamas.            11.  Place clean sheets on your bed the night of your first shower and do not  sleep with pets. Day of Surgery : Do not apply any lotions/deodorants the morning of surgery.  Please wear clean clothes to the hospital/surgery center.  FAILURE TO FOLLOW THESE INSTRUCTIONS MAY RESULT IN THE CANCELLATION OF YOUR SURGERY PATIENT SIGNATURE_________________________________  NURSE SIGNATURE__________________________________  ________________________________________________________________________

## 2016-11-26 ENCOUNTER — Encounter (HOSPITAL_COMMUNITY)
Admission: RE | Admit: 2016-11-26 | Discharge: 2016-11-26 | Disposition: A | Discharge status: Home / Self Care | Payer: Medicaid Other | Source: Ambulatory Visit | Attending: Surgery | Admitting: Surgery

## 2016-11-26 ENCOUNTER — Encounter (HOSPITAL_COMMUNITY): Payer: Self-pay

## 2016-11-26 DIAGNOSIS — K811 Chronic cholecystitis: Secondary | ICD-10-CM | POA: Insufficient documentation

## 2016-11-26 DIAGNOSIS — Z01812 Encounter for preprocedural laboratory examination: Secondary | ICD-10-CM | POA: Diagnosis present

## 2016-11-26 LAB — BASIC METABOLIC PANEL
Anion gap: 9 (ref 5–15)
BUN: 17 mg/dL (ref 6–20)
CALCIUM: 9.9 mg/dL (ref 8.9–10.3)
CO2: 27 mmol/L (ref 22–32)
CREATININE: 0.89 mg/dL (ref 0.61–1.24)
Chloride: 103 mmol/L (ref 101–111)
GFR calc Af Amer: >60 mL/min (ref >60)
Glucose, Bld: 98 mg/dL (ref 65–99)
Potassium: 4.3 mmol/L (ref 3.5–5.1)
SODIUM: 139 mmol/L (ref 135–145)

## 2016-11-26 LAB — CBC
HCT: 43.9 % (ref 39.0–52.0)
Hemoglobin: 15.1 g/dL (ref 13.0–17.0)
MCH: 31.6 pg (ref 26.0–34.0)
MCHC: 34.4 g/dL (ref 30.0–36.0)
MCV: 91.8 fL (ref 78.0–100.0)
PLATELETS: 154 10*3/uL (ref 150–400)
RBC: 4.78 MIL/uL (ref 4.22–5.81)
RDW: 12.7 % (ref 11.5–15.5)
WBC: 10.1 10*3/uL (ref 4.0–10.5)

## 2016-11-28 ENCOUNTER — Encounter (HOSPITAL_BASED_OUTPATIENT_CLINIC_OR_DEPARTMENT_OTHER): Payer: Self-pay | Admitting: *Deleted

## 2016-11-28 ENCOUNTER — Ambulatory Visit (HOSPITAL_BASED_OUTPATIENT_CLINIC_OR_DEPARTMENT_OTHER)
Admission: RE | Admit: 2016-11-28 | Discharge: 2016-11-29 | Disposition: A | Discharge status: Home / Self Care | Payer: Medicaid Other | Source: Ambulatory Visit | Attending: Surgery | Admitting: Surgery

## 2016-11-28 ENCOUNTER — Encounter (HOSPITAL_COMMUNITY)
Admission: RE | Disposition: A | Discharge status: Home / Self Care | Payer: Self-pay | Source: Ambulatory Visit | Attending: Surgery

## 2016-11-28 ENCOUNTER — Ambulatory Visit (HOSPITAL_COMMUNITY): Payer: Medicaid Other | Admitting: Anesthesiology

## 2016-11-28 ENCOUNTER — Ambulatory Visit (HOSPITAL_COMMUNITY): Payer: Medicaid Other

## 2016-11-28 DIAGNOSIS — K8010 Calculus of gallbladder with chronic cholecystitis without obstruction: Secondary | ICD-10-CM | POA: Diagnosis present

## 2016-11-28 DIAGNOSIS — Z419 Encounter for procedure for purposes other than remedying health state, unspecified: Secondary | ICD-10-CM

## 2016-11-28 DIAGNOSIS — K811 Chronic cholecystitis: Secondary | ICD-10-CM | POA: Diagnosis not present

## 2016-11-28 DIAGNOSIS — E785 Hyperlipidemia, unspecified: Secondary | ICD-10-CM | POA: Diagnosis not present

## 2016-11-28 DIAGNOSIS — F17210 Nicotine dependence, cigarettes, uncomplicated: Secondary | ICD-10-CM | POA: Diagnosis not present

## 2016-11-28 DIAGNOSIS — F10929 Alcohol use, unspecified with intoxication, unspecified: Secondary | ICD-10-CM

## 2016-11-28 DIAGNOSIS — K5909 Other constipation: Secondary | ICD-10-CM | POA: Insufficient documentation

## 2016-11-28 DIAGNOSIS — F329 Major depressive disorder, single episode, unspecified: Secondary | ICD-10-CM | POA: Diagnosis not present

## 2016-11-28 DIAGNOSIS — L409 Psoriasis, unspecified: Secondary | ICD-10-CM | POA: Diagnosis not present

## 2016-11-28 DIAGNOSIS — K700 Alcoholic fatty liver: Secondary | ICD-10-CM

## 2016-11-28 DIAGNOSIS — K860 Alcohol-induced chronic pancreatitis: Secondary | ICD-10-CM | POA: Diagnosis present

## 2016-11-28 DIAGNOSIS — K7581 Nonalcoholic steatohepatitis (NASH): Secondary | ICD-10-CM | POA: Insufficient documentation

## 2016-11-28 DIAGNOSIS — K2980 Duodenitis without bleeding: Secondary | ICD-10-CM | POA: Diagnosis present

## 2016-11-28 HISTORY — PX: LIVER BIOPSY: SHX301

## 2016-11-28 HISTORY — PX: LAPAROSCOPIC CHOLECYSTECTOMY SINGLE SITE WITH INTRAOPERATIVE CHOLANGIOGRAM: SHX6538

## 2016-11-28 HISTORY — PX: LAPAROSCOPIC CHOLECYSTECTOMY: SUR755

## 2016-11-28 SURGERY — LAPAROSCOPIC CHOLECYSTECTOMY SINGLE SITE WITH INTRAOPERATIVE CHOLANGIOGRAM
Anesthesia: General

## 2016-11-28 MED ORDER — MIDAZOLAM HCL 2 MG/2ML IJ SOLN
INTRAMUSCULAR | Status: AC
  Filled 2016-11-28: qty 2

## 2016-11-28 MED ORDER — FENTANYL CITRATE (PF) 250 MCG/5ML IJ SOLN
INTRAMUSCULAR | Status: AC
  Filled 2016-11-28: qty 5

## 2016-11-28 MED ORDER — OXYCODONE-ACETAMINOPHEN 7.5-325 MG PO TABS: 1.0000 | ORAL_TABLET | ORAL | 0 refills | Status: DC | PRN

## 2016-11-28 MED ORDER — SODIUM CHLORIDE 0.9% FLUSH: 3.0000 mL | INTRAVENOUS | Status: DC | PRN

## 2016-11-28 MED ORDER — ONDANSETRON HCL 4 MG PO TABS: 4.0000 mg | ORAL_TABLET | Freq: Three times a day (TID) | ORAL | 5 refills | Status: DC | PRN

## 2016-11-28 MED ORDER — METOPROLOL TARTRATE 5 MG/5ML IV SOLN: 5.0000 mg | Freq: Four times a day (QID) | INTRAVENOUS | Status: DC | PRN

## 2016-11-28 MED ORDER — PROPOFOL 10 MG/ML IV BOLUS
INTRAVENOUS | Status: DC | PRN
  Administered 2016-11-28: 150 mg via INTRAVENOUS

## 2016-11-28 MED ORDER — ENOXAPARIN SODIUM 40 MG/0.4ML ~~LOC~~ SOLN
40.0000 mg | SUBCUTANEOUS | Status: DC
  Administered 2016-11-29: 40 mg via SUBCUTANEOUS
  Filled 2016-11-28: qty 0.4

## 2016-11-28 MED ORDER — BISACODYL 10 MG RE SUPP: 10.0000 mg | Freq: Every day | RECTAL | Status: DC | PRN

## 2016-11-28 MED ORDER — ALBUTEROL SULFATE (2.5 MG/3ML) 0.083% IN NEBU: 2.5000 mg | INHALATION_SOLUTION | Freq: Four times a day (QID) | RESPIRATORY_TRACT | Status: DC | PRN

## 2016-11-28 MED ORDER — METHOCARBAMOL 1000 MG/10ML IJ SOLN
1000.0000 mg | Freq: Four times a day (QID) | INTRAVENOUS | Status: DC | PRN
  Filled 2016-11-28: qty 10

## 2016-11-28 MED ORDER — IOPAMIDOL (ISOVUE-300) INJECTION 61%
INTRAVENOUS | Status: AC
  Filled 2016-11-28: qty 50

## 2016-11-28 MED ORDER — GABAPENTIN 300 MG PO CAPS
300.0000 mg | ORAL_CAPSULE | Freq: Every day | ORAL | Status: DC
  Administered 2016-11-28: 300 mg via ORAL
  Filled 2016-11-28: qty 1

## 2016-11-28 MED ORDER — NICOTINE 14 MG/24HR TD PT24
14.0000 mg | MEDICATED_PATCH | Freq: Every day | TRANSDERMAL | Status: DC
  Administered 2016-11-29: 14 mg via TRANSDERMAL
  Filled 2016-11-28: qty 1

## 2016-11-28 MED ORDER — SUGAMMADEX SODIUM 200 MG/2ML IV SOLN
INTRAVENOUS | Status: AC
  Filled 2016-11-28: qty 2

## 2016-11-28 MED ORDER — HYDROMORPHONE HCL 1 MG/ML IJ SOLN
INTRAMUSCULAR | Status: AC
Start: 2016-11-28 — End: 2016-11-29
  Filled 2016-11-28: qty 1

## 2016-11-28 MED ORDER — GABAPENTIN 300 MG PO CAPS
300.0000 mg | ORAL_CAPSULE | ORAL | Status: AC
  Administered 2016-11-28: 300 mg via ORAL
  Filled 2016-11-28 (×2): qty 1

## 2016-11-28 MED ORDER — PROMETHAZINE HCL 25 MG/ML IJ SOLN: 6.2500 mg | INTRAMUSCULAR | Status: DC | PRN

## 2016-11-28 MED ORDER — CHLORHEXIDINE GLUCONATE CLOTH 2 % EX PADS
6.0000 | MEDICATED_PAD | Freq: Once | CUTANEOUS | Status: DC
  Filled 2016-11-28: qty 6

## 2016-11-28 MED ORDER — ENSURE SURGERY PO LIQD
237.0000 mL | Freq: Two times a day (BID) | ORAL | Status: DC
  Administered 2016-11-28: 237 mL via ORAL
  Filled 2016-11-28 (×3): qty 237

## 2016-11-28 MED ORDER — DIPHENHYDRAMINE HCL 50 MG/ML IJ SOLN: 12.5000 mg | Freq: Four times a day (QID) | INTRAMUSCULAR | Status: DC | PRN

## 2016-11-28 MED ORDER — BUPIVACAINE-EPINEPHRINE 0.25% -1:200000 IJ SOLN
INTRAMUSCULAR | Status: DC | PRN
  Administered 2016-11-28: 50 mL

## 2016-11-28 MED ORDER — PROCHLORPERAZINE EDISYLATE 5 MG/ML IJ SOLN: 5.0000 mg | INTRAMUSCULAR | Status: DC | PRN

## 2016-11-28 MED ORDER — METHOCARBAMOL 750 MG PO TABS: 750.0000 mg | ORAL_TABLET | Freq: Four times a day (QID) | ORAL | 2 refills | Status: DC | PRN

## 2016-11-28 MED ORDER — GUAIFENESIN-DM 100-10 MG/5ML PO SYRP: 10.0000 mL | ORAL_SOLUTION | ORAL | Status: DC | PRN

## 2016-11-28 MED ORDER — BUPIVACAINE-EPINEPHRINE (PF) 0.25% -1:200000 IJ SOLN
INTRAMUSCULAR | Status: AC
  Filled 2016-11-28: qty 30

## 2016-11-28 MED ORDER — LIDOCAINE 2% (20 MG/ML) 5 ML SYRINGE
INTRAMUSCULAR | Status: AC
  Filled 2016-11-28: qty 5

## 2016-11-28 MED ORDER — METRONIDAZOLE IN NACL 5-0.79 MG/ML-% IV SOLN
500.0000 mg | INTRAVENOUS | Status: AC
  Administered 2016-11-28: 500 mg via INTRAVENOUS
  Filled 2016-11-28 (×2): qty 100

## 2016-11-28 MED ORDER — HYDROCORTISONE 2.5 % RE CREA: 1.0000 "application " | TOPICAL_CREAM | Freq: Four times a day (QID) | RECTAL | Status: DC | PRN

## 2016-11-28 MED ORDER — DIPHENHYDRAMINE HCL 12.5 MG/5ML PO ELIX: 12.5000 mg | ORAL_SOLUTION | Freq: Four times a day (QID) | ORAL | Status: DC | PRN

## 2016-11-28 MED ORDER — IPRATROPIUM-ALBUTEROL 0.5-2.5 (3) MG/3ML IN SOLN
3.0000 mL | RESPIRATORY_TRACT | Status: DC
  Filled 2016-11-28: qty 3

## 2016-11-28 MED ORDER — SUGAMMADEX SODIUM 200 MG/2ML IV SOLN
INTRAVENOUS | Status: DC | PRN
  Administered 2016-11-28: 150 mg via INTRAVENOUS

## 2016-11-28 MED ORDER — ALUM & MAG HYDROXIDE-SIMETH 200-200-20 MG/5ML PO SUSP: 30.0000 mL | Freq: Four times a day (QID) | ORAL | Status: DC | PRN

## 2016-11-28 MED ORDER — LIP MEDEX EX OINT
1.0000 "application " | TOPICAL_OINTMENT | Freq: Two times a day (BID) | CUTANEOUS | Status: DC
  Administered 2016-11-28: 1 via TOPICAL
  Filled 2016-11-28: qty 7

## 2016-11-28 MED ORDER — BISMUTH SUBSALICYLATE 262 MG/15ML PO SUSP: 30.0000 mL | Freq: Three times a day (TID) | ORAL | Status: DC | PRN

## 2016-11-28 MED ORDER — PROPOFOL 10 MG/ML IV BOLUS
INTRAVENOUS | Status: AC
  Filled 2016-11-28: qty 20

## 2016-11-28 MED ORDER — HYDROMORPHONE HCL 1 MG/ML IJ SOLN
INTRAMUSCULAR | Status: AC
  Filled 2016-11-28: qty 1

## 2016-11-28 MED ORDER — ROCURONIUM BROMIDE 50 MG/5ML IV SOSY
PREFILLED_SYRINGE | INTRAVENOUS | Status: AC
  Filled 2016-11-28: qty 5

## 2016-11-28 MED ORDER — PHENYLEPHRINE 40 MCG/ML (10ML) SYRINGE FOR IV PUSH (FOR BLOOD PRESSURE SUPPORT)
PREFILLED_SYRINGE | INTRAVENOUS | Status: AC
  Filled 2016-11-28: qty 10

## 2016-11-28 MED ORDER — METHOCARBAMOL 500 MG PO TABS
1000.0000 mg | ORAL_TABLET | Freq: Four times a day (QID) | ORAL | Status: DC | PRN
  Administered 2016-11-29: 1000 mg via ORAL
  Filled 2016-11-28: qty 2

## 2016-11-28 MED ORDER — ONDANSETRON 4 MG PO TBDP: 4.0000 mg | ORAL_TABLET | Freq: Four times a day (QID) | ORAL | Status: DC | PRN

## 2016-11-28 MED ORDER — LACTATED RINGERS IV SOLN
INTRAVENOUS | Status: DC
  Administered 2016-11-28 (×3): via INTRAVENOUS
  Filled 2016-11-28: qty 1000

## 2016-11-28 MED ORDER — FENTANYL CITRATE (PF) 100 MCG/2ML IJ SOLN
50.0000 ug | INTRAMUSCULAR | Status: DC | PRN
  Administered 2016-11-28: 50 ug via INTRAVENOUS
  Filled 2016-11-28: qty 1

## 2016-11-28 MED ORDER — LACTATED RINGERS IV SOLN
1000.0000 mL | Freq: Three times a day (TID) | INTRAVENOUS | Status: DC | PRN
  Administered 2016-11-29: 1000 mL via INTRAVENOUS

## 2016-11-28 MED ORDER — ALBUTEROL SULFATE HFA 108 (90 BASE) MCG/ACT IN AERS
INHALATION_SPRAY | RESPIRATORY_TRACT | Status: DC | PRN
  Administered 2016-11-28: 2 via RESPIRATORY_TRACT

## 2016-11-28 MED ORDER — FENTANYL CITRATE (PF) 100 MCG/2ML IJ SOLN
INTRAMUSCULAR | Status: AC
  Administered 2016-11-28: 50 ug via INTRAVENOUS
  Filled 2016-11-28: qty 2

## 2016-11-28 MED ORDER — PHENYLEPHRINE HCL 10 MG/ML IJ SOLN
INTRAMUSCULAR | Status: DC | PRN
  Administered 2016-11-28 (×2): 80 ug via INTRAVENOUS
  Administered 2016-11-28 (×2): 40 ug via INTRAVENOUS
  Administered 2016-11-28 (×2): 80 ug via INTRAVENOUS

## 2016-11-28 MED ORDER — LORAZEPAM 2 MG/ML IJ SOLN: 0.5000 mg | Freq: Three times a day (TID) | INTRAMUSCULAR | Status: DC | PRN

## 2016-11-28 MED ORDER — HYDROCORTISONE 1 % EX CREA: 1.0000 "application " | TOPICAL_CREAM | Freq: Three times a day (TID) | CUTANEOUS | Status: DC | PRN

## 2016-11-28 MED ORDER — ALBUTEROL SULFATE (2.5 MG/3ML) 0.083% IN NEBU
INHALATION_SOLUTION | RESPIRATORY_TRACT | Status: AC
  Filled 2016-11-28: qty 3

## 2016-11-28 MED ORDER — LIDOCAINE HCL (CARDIAC) 20 MG/ML IV SOLN
INTRAVENOUS | Status: DC | PRN
  Administered 2016-11-28: 50 mg via INTRAVENOUS

## 2016-11-28 MED ORDER — SODIUM CHLORIDE 0.9% FLUSH: 3.0000 mL | Freq: Two times a day (BID) | INTRAVENOUS | Status: DC

## 2016-11-28 MED ORDER — CEFAZOLIN SODIUM-DEXTROSE 2-4 GM/100ML-% IV SOLN
2.0000 g | INTRAVENOUS | Status: AC
  Administered 2016-11-28: 2 g via INTRAVENOUS
  Filled 2016-11-28 (×2): qty 100

## 2016-11-28 MED ORDER — EPHEDRINE SULFATE 50 MG/ML IJ SOLN
INTRAMUSCULAR | Status: DC | PRN
  Administered 2016-11-28: 5 mg via INTRAVENOUS
  Administered 2016-11-28: 10 mg via INTRAVENOUS

## 2016-11-28 MED ORDER — SODIUM CHLORIDE 0.9 % IV SOLN: 250.0000 mL | INTRAVENOUS | Status: DC | PRN

## 2016-11-28 MED ORDER — ONDANSETRON HCL 4 MG/2ML IJ SOLN
INTRAMUSCULAR | Status: AC
  Filled 2016-11-28: qty 2

## 2016-11-28 MED ORDER — FENTANYL CITRATE (PF) 100 MCG/2ML IJ SOLN
INTRAMUSCULAR | Status: DC | PRN
  Administered 2016-11-28: 25 ug via INTRAVENOUS
  Administered 2016-11-28: 50 ug via INTRAVENOUS
  Administered 2016-11-28: 100 ug via INTRAVENOUS

## 2016-11-28 MED ORDER — ROCURONIUM BROMIDE 100 MG/10ML IV SOLN
INTRAVENOUS | Status: DC | PRN
  Administered 2016-11-28: 50 mg via INTRAVENOUS
  Administered 2016-11-28: 10 mg via INTRAVENOUS

## 2016-11-28 MED ORDER — OXYCODONE HCL 5 MG PO TABS
5.0000 mg | ORAL_TABLET | ORAL | Status: DC | PRN
  Administered 2016-11-29: 10 mg via ORAL
  Filled 2016-11-28: qty 2

## 2016-11-28 MED ORDER — PHENOL 1.4 % MT LIQD: 1.0000 | OROMUCOSAL | Status: DC | PRN

## 2016-11-28 MED ORDER — ONDANSETRON HCL 4 MG/2ML IJ SOLN
4.0000 mg | Freq: Four times a day (QID) | INTRAMUSCULAR | Status: DC | PRN
  Administered 2016-11-28 – 2016-11-29 (×2): 4 mg via INTRAVENOUS
  Filled 2016-11-28 (×2): qty 2

## 2016-11-28 MED ORDER — HYDROMORPHONE HCL 1 MG/ML IJ SOLN
0.2500 mg | INTRAMUSCULAR | Status: AC | PRN
  Administered 2016-11-28: 0.25 mg via INTRAVENOUS
  Administered 2016-11-28: 0.5 mg via INTRAVENOUS
  Administered 2016-11-28: 0.25 mg via INTRAVENOUS
  Administered 2016-11-28: 0.5 mg via INTRAVENOUS
  Administered 2016-11-28 (×2): 0.25 mg via INTRAVENOUS
  Administered 2016-11-28: 0.5 mg via INTRAVENOUS
  Administered 2016-11-28: 0.25 mg via INTRAVENOUS

## 2016-11-28 MED ORDER — CELECOXIB 200 MG PO CAPS
200.0000 mg | ORAL_CAPSULE | Freq: Once | ORAL | Status: AC
  Administered 2016-11-28: 200 mg via ORAL
  Filled 2016-11-28 (×2): qty 1

## 2016-11-28 MED ORDER — SIMETHICONE 80 MG PO CHEW: 40.0000 mg | CHEWABLE_TABLET | Freq: Four times a day (QID) | ORAL | Status: DC | PRN

## 2016-11-28 MED ORDER — MAGIC MOUTHWASH
15.0000 mL | Freq: Four times a day (QID) | ORAL | Status: DC | PRN
  Filled 2016-11-28: qty 15

## 2016-11-28 MED ORDER — GABAPENTIN 300 MG PO CAPS: 300.0000 mg | ORAL_CAPSULE | Freq: Two times a day (BID) | ORAL | 1 refills | Status: DC

## 2016-11-28 MED ORDER — HYDRALAZINE HCL 20 MG/ML IJ SOLN
5.0000 mg | INTRAMUSCULAR | Status: DC | PRN
  Filled 2016-11-28: qty 1

## 2016-11-28 MED ORDER — ACETAMINOPHEN 500 MG PO TABS
1000.0000 mg | ORAL_TABLET | Freq: Three times a day (TID) | ORAL | Status: DC
  Administered 2016-11-28 – 2016-11-29 (×2): 1000 mg via ORAL
  Filled 2016-11-28 (×2): qty 2

## 2016-11-28 MED ORDER — CELECOXIB 400 MG PO CAPS
400.0000 mg | ORAL_CAPSULE | ORAL | Status: AC
  Administered 2016-11-28: 200 mg via ORAL
  Filled 2016-11-28: qty 2
  Filled 2016-11-28: qty 1

## 2016-11-28 MED ORDER — ATORVASTATIN CALCIUM 40 MG PO TABS
40.0000 mg | ORAL_TABLET | Freq: Every day | ORAL | Status: DC
  Administered 2016-11-28: 40 mg via ORAL
  Filled 2016-11-28: qty 1

## 2016-11-28 MED ORDER — ONDANSETRON HCL 4 MG/2ML IJ SOLN
INTRAMUSCULAR | Status: DC | PRN
  Administered 2016-11-28: 4 mg via INTRAVENOUS

## 2016-11-28 MED ORDER — DEXAMETHASONE SODIUM PHOSPHATE 10 MG/ML IJ SOLN
INTRAMUSCULAR | Status: AC
  Filled 2016-11-28: qty 1

## 2016-11-28 MED ORDER — HYDROMORPHONE HCL 1 MG/ML IJ SOLN
0.5000 mg | INTRAMUSCULAR | Status: DC | PRN
  Administered 2016-11-28 – 2016-11-29 (×2): 1 mg via INTRAVENOUS
  Administered 2016-11-29: 2 mg via INTRAVENOUS
  Administered 2016-11-29: 1 mg via INTRAVENOUS
  Filled 2016-11-28: qty 1
  Filled 2016-11-28: qty 2
  Filled 2016-11-28 (×2): qty 1

## 2016-11-28 MED ORDER — IBUPROFEN 200 MG PO TABS: 400.0000 mg | ORAL_TABLET | Freq: Four times a day (QID) | ORAL | Status: DC | PRN

## 2016-11-28 MED ORDER — MIDAZOLAM HCL 5 MG/5ML IJ SOLN
INTRAMUSCULAR | Status: DC | PRN
  Administered 2016-11-28: 2 mg via INTRAVENOUS

## 2016-11-28 MED ORDER — ACETAMINOPHEN 500 MG PO TABS
1000.0000 mg | ORAL_TABLET | ORAL | Status: AC
  Administered 2016-11-28: 1000 mg via ORAL
  Filled 2016-11-28 (×2): qty 2

## 2016-11-28 MED ORDER — MENTHOL 3 MG MT LOZG
1.0000 | LOZENGE | OROMUCOSAL | Status: DC | PRN
Start: 2016-11-28 — End: 2016-11-29
  Filled 2016-11-28: qty 9

## 2016-11-28 MED ORDER — POLYETHYLENE GLYCOL 3350 17 G PO PACK: 17.0000 g | PACK | Freq: Two times a day (BID) | ORAL | Status: DC | PRN

## 2016-11-28 SURGICAL SUPPLY — 44 items
APPLIER CLIP 5 13 M/L LIGAMAX5 (MISCELLANEOUS) ×3
APR CLP MED LRG 5 ANG JAW (MISCELLANEOUS) ×1
BAG SPEC RTRVL LRG 6X4 10 (ENDOMECHANICALS)
CABLE HIGH FREQUENCY MONO STRZ (ELECTRODE) ×3 IMPLANT
CHLORAPREP W/TINT 26ML (MISCELLANEOUS) ×3 IMPLANT
CLIP APPLIE 5 13 M/L LIGAMAX5 (MISCELLANEOUS) ×1 IMPLANT
COVER MAYO STAND STRL (DRAPES) ×3 IMPLANT
COVER SURGICAL LIGHT HANDLE (MISCELLANEOUS) ×3 IMPLANT
DECANTER SPIKE VIAL GLASS SM (MISCELLANEOUS) ×3 IMPLANT
DRAIN CHANNEL 19F RND (DRAIN) IMPLANT
DRAPE C-ARM 42X120 X-RAY (DRAPES) ×3 IMPLANT
DRAPE WARM FLUID 44X44 (DRAPE) ×3 IMPLANT
DRSG TEGADERM 4X4.75 (GAUZE/BANDAGES/DRESSINGS) ×3 IMPLANT
DRSG TELFA 3X8 NADH (GAUZE/BANDAGES/DRESSINGS) ×3 IMPLANT
ELECT REM PT RETURN 15FT ADLT (MISCELLANEOUS) ×3 IMPLANT
ENDOLOOP SUT PDS II  0 18 (SUTURE) ×2
ENDOLOOP SUT PDS II 0 18 (SUTURE) IMPLANT
EVACUATOR SILICONE 100CC (DRAIN) IMPLANT
GAUZE SPONGE 2X2 8PLY STRL LF (GAUZE/BANDAGES/DRESSINGS) ×1 IMPLANT
GLOVE ECLIPSE 8.0 STRL XLNG CF (GLOVE) ×3 IMPLANT
GLOVE INDICATOR 8.0 STRL GRN (GLOVE) ×3 IMPLANT
GOWN STRL REUS W/TWL XL LVL3 (GOWN DISPOSABLE) ×6 IMPLANT
IRRIG SUCT STRYKERFLOW 2 WTIP (MISCELLANEOUS) ×3
IRRIGATION SUCT STRKRFLW 2 WTP (MISCELLANEOUS) ×1 IMPLANT
KIT BASIN OR (CUSTOM PROCEDURE TRAY) ×3 IMPLANT
NDL BIOPSY 14GX4.5 SOFT TIS (NEEDLE) IMPLANT
NEEDLE BIOPSY 14GX4.5 SOFT TIS (NEEDLE) ×3 IMPLANT
PAD DRESSING TELFA 3X8 NADH (GAUZE/BANDAGES/DRESSINGS) IMPLANT
PAD POSITIONING PINK XL (MISCELLANEOUS) ×3 IMPLANT
POSITIONER SURGICAL ARM (MISCELLANEOUS) ×2 IMPLANT
POUCH SPECIMEN RETRIEVAL 10MM (ENDOMECHANICALS) IMPLANT
SCISSORS LAP 5X35 DISP (ENDOMECHANICALS) ×3 IMPLANT
SET CHOLANGIOGRAPH MIX (MISCELLANEOUS) ×3 IMPLANT
SHEARS HARMONIC ACE PLUS 36CM (ENDOMECHANICALS) ×3 IMPLANT
SPONGE GAUZE 2X2 STER 10/PKG (GAUZE/BANDAGES/DRESSINGS) ×2
SUT MNCRL AB 4-0 PS2 18 (SUTURE) ×3 IMPLANT
SUT PDS AB 1 CT1 27 (SUTURE) ×6 IMPLANT
SYR 20CC LL (SYRINGE) ×3 IMPLANT
TOWEL OR 17X26 10 PK STRL BLUE (TOWEL DISPOSABLE) ×3 IMPLANT
TOWEL OR NON WOVEN STRL DISP B (DISPOSABLE) ×3 IMPLANT
TRAY LAPAROSCOPIC (CUSTOM PROCEDURE TRAY) ×3 IMPLANT
TROCAR BLADELESS OPT 5 100 (ENDOMECHANICALS) ×3 IMPLANT
TROCAR BLADELESS OPT 5 150 (ENDOMECHANICALS) ×3 IMPLANT
TUBING INSUF HEATED (TUBING) ×3 IMPLANT

## 2016-11-28 NOTE — Anesthesia Postprocedure Evaluation (Signed)
Anesthesia Post Note  Patient: [name]  Procedure(s) Performed: Procedure(s) (LRB): LAPAROSCOPIC CHOLECYSTECTOMY SINGLE SITE WITH INTRAOPERATIVE CHOLANGIOGRAM (N/A) NEEDLE CORE LIVER BIOPSY (N/A)     Patient location during evaluation: PACU Anesthesia Type: General Level of consciousness: awake and alert Pain management: pain level controlled Vital Signs Assessment: post-procedure vital signs reviewed and stable Respiratory status: spontaneous breathing, nonlabored ventilation, respiratory function stable and patient connected to nasal cannula oxygen Cardiovascular status: blood pressure returned to baseline and stable Postop Assessment: no signs of nausea or vomiting Anesthetic complications: no    Last Vitals:  Vitals:   11/28/16 1445 11/28/16 1500  BP: 106/66 (!) 79/46  Pulse: 88 83  Resp: 18 10  Temp:      Last Pain:  Vitals:   11/28/16 1445  TempSrc:   PainSc: 7                  [name] S

## 2016-11-28 NOTE — H&P (Signed)
[name] 10/03/2016 11:46 AM Location: [facility] Patient #: [id number] DOB: 07-18-1960 Undefined / Language: [place] / Race: White Male  Patient Care Team: Jackie Plum, MD as PCP - General (Internal Medicine) Kathi Der, MD as Consulting Physician (Gastroenterology) Karie Soda, MD as Consulting Physician (General Surgery)   History of Present Illness Samuel Sportsman MD; 10/03/2016 2:14 PM) The patient is a 56 year old male who presents with pancreatitis.  ` ` Patient sent for surgical consultation at the request of his gastroenterologist, Dr. Georgiann Cocker with [facility] gastroenterology. Concern for postprandial right upper quadrant pain and benefit of cholecystectomy in the setting of chronic pancreatitis.  56 year old smoking male. He comes today with his mother. He is on disability for numerous health and pain issues. Chronic pain. Chronic pancreatitis seems to be mainly isolated to the head of the pancreas. 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 Has had intermittent episodes of right upper quadrant abdominal pain for many years. Attacks can be severe. 8-9 out of 10. A point where he has chronic soreness now. Diffley word after he's eating heavier foods such as spicy foods. He no evidence of any gallstones by ultrasound or MRI/MRCP. Has chronic constipation moves his bowels about twice a week. Followed by gastroenterology intermittently. Trying to reestablish outpatient care given improvement in his insurance/financial status. He is also on disability. Establish primary care physician. Patient claims he can walk maybe a block before he has to stop. Claims he has COPD. He is tried to quit smoking but he could not. He is aware of pancreatitis. He's been absent alcohol. Looks like radiographically that been stable for the past 7 years or so.  However the attack or pains persistent are becoming more intense now. He does have a  history of chronic heartburn and reflux. He had esophagitis and gastritis based on prior endoscopies many years ago. He is not on any antacid medication at this time. Just occasionally takes Tums. Tries to avoid nonsteroidals. Patient also has rather significant psoriasis. He is been taking topical moisturizers. He recalls being told by someone that he is not allowed to take any more aggressive or systemic immunosuppressive medications for his psoriasis based on his chronic pancreatitis. He moves his bowels maybe twice a week. Not any bowel regimen at all.  Because of the persistent upper abdominal pain with component suspicious for biliary colic, surgical consultation requested to see if patient would benefit from cholecystectomy.   Past Surgical History (Janette Ranson, CMA; 10/03/2016 11:46 AM) No pertinent past surgical history  Diagnostic Studies History (Janette Ranson, CMA; 10/03/2016 11:46 AM) Colonoscopy 1-5 years ago  Allergies (Janette Ranson, CMA; 10/03/2016 11:53 AM) No Known Drug Allergies 10/03/2016 Allergies Reconciled  Medication History (Janette Ranson, CMA; 10/03/2016 11:53 AM) Oxycodone-Acetaminophen (7.5-325MG  Tablet, Oral) Active.  Social History (Janette Ranson, CMA; 10/03/2016 11:46 AM) Alcohol use Remotely quit alcohol use. Tobacco use Current every day smoker.  Family History (Janette Ranson, CMA; 10/03/2016 11:46 AM) Alcohol Abuse Father. Arthritis Mother. Depression Mother. Thyroid problems Mother.  Other Problems (Janette Ranson, CMA; 10/03/2016 11:46 AM) Alcohol Abuse Back Pain Chronic Obstructive Lung Disease Depression Gastric Ulcer Pancreatitis     Review of Systems (Janette Ranson CMA; 10/03/2016 11:46 AM) General Present- Appetite Loss. Not Present- Chills, Fatigue, Fever, Night Sweats, Weight Gain and Weight Loss. Skin Present- Rash. Not Present- Change in Wart/Mole, Dryness, Hives, Jaundice, New Lesions,  Non-Healing Wounds and Ulcer. HEENT Not Present- Earache, Hearing Loss, Hoarseness, Nose Bleed, Oral Ulcers, Ringing in the  Ears, Seasonal Allergies, Sinus Pain, Sore Throat, Visual Disturbances, Wears glasses/contact lenses and Yellow Eyes. Respiratory Present- Difficulty Breathing. Not Present- Bloody sputum, Chronic Cough, Snoring and Wheezing. Breast Not Present- Breast Mass, Breast Pain, Nipple Discharge and Skin Changes. Cardiovascular Present- Shortness of Breath. Not Present- Chest Pain, Difficulty Breathing Lying Down, Leg Cramps, Palpitations, Rapid Heart Rate and Swelling of Extremities. Gastrointestinal Present- Abdominal Pain, Indigestion and Nausea. Not Present- Bloating, Bloody Stool, Change in Bowel Habits, Chronic diarrhea, Constipation, Difficulty Swallowing, Excessive gas, Gets full quickly at meals, Hemorrhoids, Rectal Pain and Vomiting. Male Genitourinary Not Present- Blood in Urine, Change in Urinary Stream, Frequency, Impotence, Nocturia, Painful Urination, Urgency and Urine Leakage.  Vitals (Janette Ranson CMA; 10/03/2016 11:54 AM) 10/03/2016 11:53 AM Weight: 138 lb Height: 68in Body Surface Area: 1.75 m Body Mass Index: 20.98 kg/m  Temp.: 75F  Pulse: 97 (Regular)  BP: 100/68 (Sitting, Left Arm, Standard)  BP 117/80 (BP Location: Right Arm)   Pulse 78   Temp 97.9 F (36.6 C) (Oral)   Resp 16   Ht 5\' 8"  (1.727 m)   Wt 63.5 kg (140 lb)   SpO2 94%   BMI 21.29 kg/m    General: Pt awake/alert/oriented x4 in no major acute distress Eyes: PERRL, normal EOM. Sclera nonicteric Neuro: CN II-XII intact w/o focal sensory/motor deficits. Lymph: No head/neck/groin lymphadenopathy Psych:  No delerium/psychosis/paranoia HENT: Normocephalic, Mucus membranes moist.  No thrush Neck: Supple, No tracheal deviation Chest: No pain.  Good respiratory excursion. CV:  Pulses intact.  Regular rhythm MS: Normal AROM mjr joints.  No obvious deformity Abdomen: Soft,  Nondistended.  Nontender.  No incarcerated hernias. Ext:  SCDs BLE.  No significant edema.  No cyanosis Skin: No petechiae / purpura    Assessment & Plan Samuel Sportsman MD; 10/03/2016 2:15 PM)  CHRONIC CHOLECYSTITIS WITHOUT CALCULUS (K81.1) Impression: History and physical suspicious for biliary colic in a patient with no definite gallstones.  Would like to get a HIDA scan with gallbladder ejection fraction. See if there is some objective evidence of biliary dyskinesia/colic.  Unless that is completely normal, I think it is reasonable to offer cholecystectomy. Reasonable outpatient single site laparoscopic approach. I think his biggest operative risk is that does not solve his abdominal complaints. I did try and frame expectations in that I think he has some chronic heartburn, gastritis, constipation, abdominal pain. I'm skeptical that cholecystectomy is solve all of that. However his postprandial right upper quadrant symptoms seems more consistent with biliary dyskinesia/colic and chronic pancreatitis per se.  Workup equivocal but story suspicious for biliary colic.  He would like to take the risk and proceed with cholecystectomy in the hope of breaking the episodes of upper abdominal pain and nausea suspicious for colic.  Have a chance to get his chronic pain under better control.  He has been cleared by cardiology.  Current Plans You are being scheduled for surgery- Our schedulers will call you.  You should hear from our office's scheduling department within 5 working days about the location, date, and time of surgery. We try to make accommodations for patient's preferences in scheduling surgery, but sometimes the OR schedule or the surgeon's schedule prevents [place] from making those accommodations.  If you have not heard from our office [phone]) in 5 working days, call the office and ask for your surgeon's nurse.  If you have other questions about your diagnosis, plan, or  surgery, call the office and ask for your surgeon's nurse.  Written instructions provided  Pt Education - Pamphlet Given - Laparoscopic Gallbladder Surgery: discussed with patient and provided information. The anatomy & physiology of hepatobiliary & pancreatic function was discussed. The pathophysiology of gallbladder dysfunction was discussed. Natural history risks without surgery was discussed. I feel the risks of no intervention will lead to serious problems that outweigh the operative risks; therefore, I recommended cholecystectomy to remove the pathology. I explained laparoscopic techniques with possible need for an open approach. Probable cholangiogram to evaluate the bilary tract was explained as well.  Risks such as bleeding, infection, abscess, leak, injury to other organs, need for further treatment, heart attack, death, and other risks were discussed. I noted a good likelihood this will help address the problem. Possibility that this will not correct all abdominal symptoms was explained. Goals of post-operative recovery were discussed as well. We will work to minimize complications. An educational handout further explaining the pathology and treatment options was given as well. Questions were answered. The patient expresses understanding & wishes to proceed with surgery.  Pt Education - [facility] Laparosopic Post Op HCI (Cabe Lashley) Pt Education - Laparoscopic Cholecystectomy: gallbladder I recommended obtaining preoperative cardiac clearance. I am concerned about the health of the patient and the ability to tolerate the operation. Therefore, we will request clearance by cardiology to better assess operative risk & see if a reevaluation, further workup, etc is needed. Also recommendations on how medications such as for anticoagulation and blood pressure should be managed/held/restarted after surgery. I recommended obtaining preoperative medical clearance. I am concerned about the  health of the patient and the ability to tolerate the operation. Therefore, we will request clearance by medicine to better assess operative risk & see if a reevaluation, further workup, etc is needed. Also recommendations on how medications should be managed/held/restarted after surgery. HEARTBURN (R12) Impression: History of esophagitis and gastritis with persistent heartburn.  Consider trial of over-the-counter omeprazole twice a day. See if that helps improve things. I am skeptical explains his episodes of biliary colic, but might help with some his abdominal pain. Defer to his gastroenterologist.  PSORIASIS (L40.9) Impression: I think he would benefit from medical therapy. He is more interested in just Eucerin. He recalls being told more aggressive regimens would go gets his chronic pancreatitis. I'm somewhat skeptical that that worth considering with a dermatology evaluation. Defer to primary care physician.  CHRONIC ALCOHOLIC PANCREATITIS (K86.0) Impression: Question of pancreatic insufficiency. Remains abstinent on alpha call soft without stable.  We'll offer cholecystectomy in hopes that that can help further prevent exacerbation his pancreatitis. Guarded in that it would not solve all problems.  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 Reassuring. A few small tiny cysts on the pancreatic head. No other pancreatic lesions of concern. Stable chronic pancreatitis isolated mainly to the head.  CHRONIC CONSTIPATION (K59.09)  Current Plans Pt Education - CCS Constipation (AT) Pt Education - [facility] Good Bowel Health (Cynthia Stainback) TOBACCO ABUSE (Z72.0) Impression: I strongly recommended he quit smoking. He noted he's had family members passed away from complications related to smoking. He had he's convinced he cannot quit. Doesn't sound like he seriously tried with regimen are gone to support systems. He has  tolerated patches when he is in the hospital.  Current Plans Pt Education - [facility] STOP SMOKING!  Samuel SportsmanSteven C. Braleigh Massoud, M.D., F.A.C.S. Gastrointestinal and Minimally Invasive Surgery [facility] [address] [place], [place] [id number] [phone] Main / Paging

## 2016-11-28 NOTE — Anesthesia Preprocedure Evaluation (Addendum)
Anesthesia Evaluation  Patient identified by MRN, date of birth, ID band Patient awake    Reviewed: Allergy & Precautions, NPO status , Patient's Chart, lab work & pertinent test results  Airway Mallampati: III  TM Distance: <3 FB Neck ROM: Full    Dental  (+) Poor Dentition, Chipped, Dental Advisory Given   Pulmonary COPD, Current Smoker,    Pulmonary exam normal breath sounds clear to auscultation       Cardiovascular negative cardio ROS Normal cardiovascular exam Rhythm:Regular Rate:Normal     Neuro/Psych negative neurological ROS  negative psych ROS   GI/Hepatic negative GI ROS, Neg liver ROS,   Endo/Other  negative endocrine ROS  Renal/GU negative Renal ROS  negative genitourinary   Musculoskeletal negative musculoskeletal ROS (+)   Abdominal   Peds negative pediatric ROS (+)  Hematology negative hematology ROS (+)   Anesthesia Other Findings   Reproductive/Obstetrics negative OB ROS                            Anesthesia Physical Anesthesia Plan  ASA: II  Anesthesia Plan: General   Post-op Pain Management:    Induction: Intravenous  PONV Risk Score and Plan: 1 and Ondansetron and Treatment may vary due to age  Airway Management Planned: Oral ETT  Additional Equipment:   Intra-op Plan:   Post-operative Plan: Extubation in OR  Informed Consent: I have reviewed the patients History and Physical, chart, labs and discussed the procedure including the risks, benefits and alternatives for the proposed anesthesia with the patient or authorized representative who has indicated his/her understanding and acceptance.   Dental advisory given  Plan Discussed with: CRNA and Surgeon  Anesthesia Plan Comments:        Anesthesia Quick Evaluation

## 2016-11-28 NOTE — Discharge Instructions (Signed)
LAPAROSCOPIC SURGERY: POST OP INSTRUCTIONS  ######################################################################  EAT Gradually transition to a high fiber diet with a fiber supplement over the next few weeks after discharge.  Start with a pureed / full liquid diet (see below)  WALK Walk an hour a day.  Control your pain to do that.    CONTROL PAIN Control pain so that you can walk, sleep, tolerate sneezing/coughing, go up/down stairs.  HAVE A BOWEL MOVEMENT DAILY Keep your bowels regular to avoid problems.  OK to try a laxative to override constipation.  OK to use an antidairrheal to slow down diarrhea.  Call if not better after 2 tries  CALL IF YOU HAVE PROBLEMS/CONCERNS Call if you are still struggling despite following these instructions. Call if you have concerns not answered by these instructions  ######################################################################    1. DIET: Follow a light bland diet the first 24 hours after arrival home, such as soup, liquids, crackers, etc.  Be sure to include lots of fluids daily.  Avoid fast food or heavy meals as your are more likely to get nauseated.  Eat a low fat the next few days after surgery.   2. Take your usually prescribed home medications unless otherwise directed. 3. PAIN CONTROL: a. Pain is best controlled by a usual combination of three different methods TOGETHER: i. Ice/Heat ii. Over the counter pain medication iii. Prescription pain medication b. Most patients will experience some swelling and bruising around the incisions.  Ice packs or heating pads (30-60 minutes up to 6 times a day) will help. Use ice for the first few days to help decrease swelling and bruising, then switch to heat to help relax tight/sore spots and speed recovery.  Some people prefer to use ice alone, heat alone, alternating between ice & heat.  Experiment to what works for you.  Swelling and bruising can take several weeks to resolve.   c. It is  helpful to take an over-the-counter pain medication regularly for the first few weeks.  Choose one of the following that works best for you: i. Naproxen (Aleve, etc)  Two 251m tabs twice a day ii. Ibuprofen (Advil, etc) Three 2053mtabs four times a day (every meal & bedtime) iii. Acetaminophen (Tylenol, etc) 500-65044mour times a day (every meal & bedtime) d. A  prescription for pain medication (such as oxycodone, hydrocodone, etc) should be given to you upon discharge.  Take your pain medication as prescribed.  i. If you are having problems/concerns with the prescription medicine (does not control pain, nausea, vomiting, rash, itching, etc), please call us [phone] see if we need to switch you to a different pain medicine that will work better for you and/or control your side effect better. ii. If you need a refill on your pain medication, please contact your pharmacy.  They will contact our office to request authorization. Prescriptions will not be filled after 5 pm or on week-ends. 4. Avoid getting constipated.  Between the surgery and the pain medications, it is common to experience some constipation.  Increasing fluid intake and taking a fiber supplement (such as Metamucil, Citrucel, FiberCon, MiraLax, etc) 1-2 times a day regularly will usually help prevent this problem from occurring.  A mild laxative (prune juice, Milk of Magnesia, MiraLax, etc) should be taken according to package directions if there are no bowel movements after 48 hours.   5. Watch out for diarrhea.  If you have many loose bowel movements, simplify your diet to bland foods & liquids for  a few days.  Stop any stool softeners and decrease your fiber supplement.  Switching to mild anti-diarrheal medications (Kayopectate, Pepto Bismol) can help.  If this worsens or does not improve, please call us. 6. Wash / shower every day.  You may shower over the dressings as they are waterproof.  Continue to shower over incision(s)  after the dressing is off. 7. Remove your waterproof bandages 5 days after surgery.  You may leave the incision open to air.  You may replace a dressing/Band-Aid to cover the incision for comfort if you wish.  8. ACTIVITIES as tolerated:   a. You may resume regular (light) daily activities beginning the next day--such as daily self-care, walking, climbing stairs--gradually increasing activities as tolerated.  If you can walk 30 minutes without difficulty, it is safe to try more intense activity such as jogging, treadmill, bicycling, low-impact aerobics, swimming, etc. b. Save the most intensive and strenuous activity for last such as sit-ups, heavy lifting, contact sports, etc  Refrain from any heavy lifting or straining until you are off narcotics for pain control.   c. DO NOT PUSH THROUGH PAIN.  Let pain be your guide: If it hurts to do something, don't do it.  Pain is your body warning you to avoid that activity for another week until the pain goes down. d. You may drive when you are no longer taking prescription pain medication, you can comfortably wear a seatbelt, and you can safely maneuver your car and apply brakes. e. Dennis Bast may have sexual intercourse when it is comfortable.  9. FOLLOW UP in our office a. Please call [facility] at [phone] to set up an appointment to see your surgeon in the office for a follow-up appointment approximately 2-3 weeks after your surgery. b. Make sure that you call for this appointment the day you arrive home to insure a convenient appointment time. 10. IF YOU HAVE DISABILITY OR FAMILY LEAVE FORMS, BRING THEM TO THE OFFICE FOR PROCESSING.  DO NOT GIVE THEM TO YOUR DOCTOR.   WHEN TO CALL us [phone]: 1. Poor pain control 2. Reactions / problems with new medications (rash/itching, nausea, etc)  3. Fever over 101.5 F (38.5 C) 4. Inability to urinate 5. Nausea and/or vomiting 6. Worsening swelling or bruising 7. Continued bleeding from incision. 8. Increased  pain, redness, or drainage from the incision   The clinic staff is available to answer your questions during regular business hours (8:30am-5pm).  Please dont hesitate to call and ask to speak to one of our nurses for clinical concerns.   If you have a medical emergency, go to the nearest emergency room or call 911.  A surgeon from [facility] is always on call at the [facility], [place], [place], [place], [place]  [address] ? MAIN: [phone] ? TOLL FREE: [phone] ?  FAX [phone] [email or web address]   Cholecystitis Cholecystitis is inflammation of the gallbladder. It is often called a gallbladder attack. The gallbladder is a pear-shaped organ that lies beneath the liver on the right side of the body. The gallbladder stores bile, which is a fluid that helps the body to digest fats. If bile builds up in your gallbladder, your gallbladder becomes inflamed. This condition may occur suddenly (be acute). Repeat episodes of acute cholecystitis or prolonged episodes may lead to a long-term (chronic) condition. Cholecystitis is serious and it requires treatment. What are the causes? The most common cause of this condition  is gallstones. Gallstones can block the tube (duct) that carries bile out of your gallbladder. This causes bile to build up. Other causes of this condition include:  Damage to the gallbladder due to a decrease in blood flow.  Infections in the bile ducts.  Scars or kinks in the bile ducts.  Tumors in the liver, pancreas, or gallbladder.  What increases the risk? This condition is more likely to develop in:  People who have sickle cell disease.  People who take birth control pills or use estrogen.  People who have alcoholic liver disease.  People who have liver cirrhosis.  People who have their nutrition delivered through a vein (parenteral nutrition).  People who do not eat or drink  (do fasting) for a long period of time.  People who are obese.  People who have rapid weight loss.  People who are pregnant.  People who have increased triglyceride levels.  People who have pancreatitis.  What are the signs or symptoms? Symptoms of this condition include:  Abdominal pain, especially in the upper right area of the abdomen.  Abdominal tenderness or bloating.  Nausea.  Vomiting.  Fever.  Chills.  Yellowing of the skin and the whites of the eyes (jaundice).  How is this diagnosed? This condition is diagnosed with a medical history and physical exam. You may also have other tests, including:  Imaging tests, such as: ? An ultrasound of the gallbladder. ? A CT scan of the abdomen. ? A gallbladder nuclear scan (HIDA scan). This scan allows your health care provider to see the bile moving from your liver to your gallbladder and to your small intestine. ? MRI.  Blood tests, such as: ? A complete blood count, because the white blood cell count may be higher than normal. ? Liver function tests, because some levels may be higher than normal with certain types of gallstones.  How is this treated? Treatment may include:  Fasting for a certain amount of time.  IV fluids.  Medicine to treat pain or vomiting.  Antibiotic medicine.  Surgery to remove your gallbladder (cholecystectomy). This may happen immediately or at a later time.  Follow these instructions at home: Home care will depend on your treatment. In general:  Take over-the-counter and prescription medicines only as told by your health care provider.  If you were prescribed an antibiotic medicine, take it as told by your health care provider. Do not stop taking the antibiotic even if you start to feel better.  Follow instructions from your health care provider about what to eat or drink. When you are allowed to eat, avoid eating or drinking anything that triggers your symptoms.  Keep all  follow-up visits as told by your health care provider. This is important.  Contact a health care provider if:  Your pain is not controlled with medicine.  You have a fever. Get help right away if:  Your pain moves to another part of your abdomen or to your back.  You continue to have symptoms or you develop new symptoms even with treatment. This information is not intended to replace advice given to you by your health care provider. Make sure you discuss any questions you have with your health care provider. Document Released: 06/10/2005 Document Revised: 10/19/2015 Document Reviewed: 09/21/2014 Elsevier Interactive Patient Education  2017 [employer]   Chronic Pancreatitis Chronic pancreatitis is long-lasting inflammation and scarring of the pancreas. The pancreas is a gland that is located behind the stomach. It produces enzymes that help  to digest food. The pancreas also releases the hormones glucagon and insulin, which help to regulate blood sugar. Damage to the pancreas may affect digestion, cause pain in the upper abdomen and back, and cause diabetes. Inflammation can also irritate other abdominal organs near the pancreas. At the very beginning, pancreatitis may be sudden (acute). If acute pancreatitis is not caught in time or treated effectively, or if you have several or prolonged episodes of acute pancreatitis, then the condition can turn into chronic pancreatitis. What are the causes? The most common cause of this condition is alcohol abuse. Other causes include:  High levels of triglycerides in the blood (hypertriglyceridemia).  Gallstones or other conditions that can block the tube that drains the pancreas (pancreatic duct).  Pancreatic cancer.  Cystic fibrosis.  Too much calcium in the blood (hypercalcemia), which may be caused by an overactive parathyroid gland (hyperparathyroidism).  Certain medicines.  Injury to the pancreas.  Infection.  Autoimmune  pancreatitis. This is when the body's disease-fighting (immune) system attacks the pancreas.  Genes that are passed along from parent to child (inherited).  In some cases, the cause may not be known. What increases the risk? This condition is more likely to develop in:  Men.  People who are 8140-54 years old.  What are the signs or symptoms? Symptoms of this condition may include:  Abdominal pain. Pain may also be felt in the upper back and may get worse after eating.  Nausea and vomiting.  Fever.  Weight loss.  A change in the color and consistency of bowel movements, such as diarrhea.  How is this diagnosed? This condition is diagnosed based on your symptoms, your medical history, and a physical exam. You may have tests, such as:  Blood tests.  Stool samples.  Biopsy of the pancreas. This is the removal of a small amount of pancreas tissue to be tested in a lab.  Imaging studies, such as: ? X-rays. ? CT scan. ? MRI. ? Ultrasound.  How is this treated? The goal of treatment is to help relieve symptoms and to prevent complications from occurring. Treatment focuses on:  Resting the pancreas. You may need to stop eating and drinking for a few days while in the hospital to give your pancreas time to recover. During this time, you will be given IV fluids to keep you hydrated.  Controlling pain. You may be given pain medicines by mouth (orally) or as injections.  Improving digestion. You may be given: ? Medicines to help balance your enzymes. ? Vitamin supplements. ? A specific diet to follow. If you are given a diet, you may work with a specialist (dietitian).  Preventing diabetes. You may need insulin injections.  You may have surgery to:  Clear the pancreatic ducts of any blockages, such as gallstones.  Remove any fluid or damaged tissue from the pancreas.  Follow these instructions at home:  Take over-the-counter and prescription medicines only as told by  your health care provider. This includes any vitamin supplements.  Do not drive or operate heavy machinery while taking prescription pain medicines.  Drink enough fluid to keep your urine clear or pale yellow.  Do not drink alcohol. If you need help quitting, ask your health care provider.  Do not use any tobacco products, such as cigarettes, chewing tobacco, and e-cigarettes. If you need help quitting, ask your health care provider.  Follow a diet as told by your health care provider or dietitian, if this applies. This may include: ? Limiting  how much fat you eat. ? Eating smaller meals more often. ? Avoiding caffeine.  Keep all follow-up visits as told by your health care provider. This is important. Contact a health care provider if:  You have pain that does not get better with medicine.  You have a fever. Get help right away if:  Your pain suddenly gets worse.  You have sudden abdominal swelling.  You start to vomit often or you vomit blood.  You have diarrhea that does not go away.  You have blood in your stool. This information is not intended to replace advice given to you by your health care provider. Make sure you discuss any questions you have with your health care provider. Document Released: 07/07/2015 Document Revised: 11/16/2015 Document Reviewed: 05/18/2014 Elsevier Interactive Patient Education  [employer].

## 2016-11-28 NOTE — Op Note (Signed)
11/28/2016  PATIENT:  [name]  56 y.o. male  Patient Care Team: Jackie Plum, MD as PCP - General (Internal Medicine) Kathi Der, MD as Consulting Physician (Gastroenterology) Karie Soda, MD as Consulting Physician (General Surgery)  PRE-OPERATIVE DIAGNOSIS:    Chronic Calculus cholecystitis  POST-OPERATIVE DIAGNOSIS:   Chronic Calculus cholecystitis  Liver: Fatty steatohepatitis  PROCEDURE:  Core Liver Biopsy & SINGLE SITE Laparoscopic cholecystectomy with intraoperative cholangiogram  SURGEON:  [name], MD, FACS.  ASSISTANT: RNFA   ANESTHESIA:    General with endotracheal intubation Local anesthetic as a field block  EBL:  (See Anesthesia Intraoperative Record) Total I/O In: 2000 [I.V.:2000] Out: 25 [Blood:25]  Delay start of Pharmacological VTE agent (>24hrs) due to surgical blood loss or risk of bleeding:  no  DRAINS: None   SPECIMEN: Gallbladder & Core liver biopsies    DISPOSITION OF SPECIMEN:  PATHOLOGY  COUNTS:  YES  PLAN OF CARE: Admit for overnight observation  PATIENT DISPOSITION:  PACU - hemodynamically stable.  INDICATION: 56 year old gentleman with chronic pancreatitis and intermittent heavy alcohol use.  Episodes of upper abdominal pain.  Somewhat suspicious for biliary colic.  Workup clavicle but the rest of the function diagnosis less likely.  I recommended diagnostic laparoscopy with cholecystectomy, clean screw, and probable core liver biopsy.  The anatomy & physiology of hepatobiliary & pancreatic function was discussed.  The pathophysiology of gallbladder dysfunction was discussed.  Natural history risks without surgery was discussed.   I feel the risks of no intervention will lead to serious problems that outweigh the operative risks; therefore, I recommended cholecystectomy to remove the pathology.  I explained laparoscopic techniques with possible need for an open approach.  Probable cholangiogram to evaluate the  bilary tract was explained as well.    Risks such as bleeding, infection, abscess, leak, injury to other organs, need for further treatment, heart attack, death, and other risks were discussed.  I noted a good likelihood this will help address the problem.  Possibility that this will not correct all abdominal symptoms was explained.  Goals of post-operative recovery were discussed as well.  We will work to minimize complications.  An educational handout further explaining the pathology and treatment options was given as well.  Questions were answered.  The patient expresses understanding & wishes to proceed with surgery.  OR FINDINGS: Dilated boggy gallbladder with some adhesions suspicious for chronic calculus cholecystitis.  Cholangiogram with classic biliary anatomy.  No choledocholithiasis.  No leak or stricture.  Liver: Fatty steatohepatitis  DESCRIPTION:   The patient was identified & brought in the operating room. The patient was positioned supine with arms tucked. SCDs were active during the entire case. The patient underwent general anesthesia without any difficulty.  The abdomen was prepped and draped in a sterile fashion. A Surgical Timeout confirmed our plan.  I made a transverse curvilinear incision through the superior umbilical fold.  I placed a 5mm long port through the supraumbilical fascia using a modified Hassan cutdown technique with umbilical stalk fascial countertraction. I began carbon dioxide insufflation.  No change in end tidal CO2 measurement.   Camera inspection revealed no injury. There were no adhesions to the anterior abdominal wall supraumbilically.  I proceeded to continue with single site technique. I placed a #5 port in left upper aspect of the wound. I placed a 5 mm atraumatic grasper in the right inferior aspect of the wound.  I turned attention to the right upper quadrant.   The gallbladder fundus  was elevated cephalad. I freed adhesions to the ventral surface of  the gallbladder off carefully.  I freed the peritoneal coverings between the gallbladder and the liver on the posteriolateral and anteriomedial walls. I alternated between Harmonic & blunt Maryland dissection to help get a good critical view of the cystic artery and cystic duct. I did further dissection to free a few centimeters of the gallbladder off the liver bed to get a good critical view of the infundibulum and cystic duct. I dissected out the cystic artery; and, after getting a good 360 view, ligated the anterior & posterior branches of the cystic artery close on the infundibulum using the Harmonic ultrasonic dissection.  I skeletonized the cystic duct.  I placed a clip on the infundibulum. I did a partial cystic duct-otomy and ensured patency. I placed a 5 [place] cholangiocatheter through a puncture site at the right subcostal ridge of the abdominal wall and directed it into the cystic duct.  We ran a cholangiogram with dilute radio-opaque contrast and continuous fluoroscopy. Contrast flowed from a side branch consistent with cystic duct cannulization.  Initially would only go antegrade into the distal common common bile duct across into the duodenum.  However not reflux up.  Therefore I reposition the catheter and redid the cholangiogram.  Contrast flowed up the common hepatic duct into the right and left intrahepatic chains out to secondary radicals. Contrast flowed down the common bile duct easily across the normal ampulla into the duodenum.  This was consistent with a normal cholangiogram.  I removed the cholangiocatheter.  The gallbladder avulsed off the cystic duct stump.  I placed a 0 PDS Endoloop on the cystic duct stump.  I placed clips on the cystic duct x4.   I freed the gallbladder from its remaining attachments to the liver. I ensured hemostasis on the gallbladder fossa of the liver and elsewhere.  Because there seem to be some fatty change in liver and his social history, proceeded with  core liver biopsies.  I used a 14-gauge true-cut core biopsy.  I did three passes.  Got good cores.  I assured hemostasis.  I inspected the rest of the abdomen & detected no injury nor bleeding elsewhere.  I removed the gallbladder out the supraumbilical fascia. I closed the fascia transversely using #1 PDS interrupted stitches. I closed the skin using 4-0 monocryl stitch.  Sterile dressing was applied. The patient was extubated & arrived in the PACU in stable condition..  I had discussed postoperative care with the patient in the holding area. I discussed operative findings, updated the patient's status, discussed probable steps to recovery, and gave postoperative recommendations to the patient's family.  Recommendations were made.  Patient no longer with a pain clinic so I did renew a short course of Percocet.  Also stressed nonnarcotic pain control with ice, heat, over-the-counter medications such as ibuprofen, Robaxin and gabapentin as needed.  He has an appointment to see me in three weeks.  Questions were answered.  They expressed understanding & appreciation.  Ardeth SportsmanSteven C. Fenris Cauble, M.D., F.A.C.S. Gastrointestinal and Minimally Invasive Surgery [facility] [address], [address] [place], [place] [id number] [phone] Main / Paging  11/28/2016 2:22 PM

## 2016-11-28 NOTE — Transfer of Care (Signed)
Immediate Anesthesia Transfer of Care Note  Patient: [name]  Procedure(s) Performed: Procedure(s): LAPAROSCOPIC CHOLECYSTECTOMY SINGLE SITE WITH INTRAOPERATIVE CHOLANGIOGRAM (N/A) NEEDLE CORE LIVER BIOPSY (N/A)  Patient Location: PACU  Anesthesia Type:General  Level of Consciousness: awake, alert  and oriented  Airway & Oxygen Therapy: Patient Spontanous Breathing and Patient connected to face mask oxygen  Post-op Assessment: Report given to RN and Post -op Vital signs reviewed and stable  Post vital signs: Reviewed and stable  Last Vitals:  Vitals:   11/28/16 1145 11/28/16 1200  BP: 119/90 119/85  Pulse: 78 85  Resp:    Temp:      Last Pain:  Vitals:   11/28/16 1145  TempSrc:   PainSc: 3       Patients Stated Pain Goal: 4 (11/28/16 [id number])  Complications: No apparent anesthesia complications

## 2016-11-28 NOTE — Interval H&P Note (Signed)
History and Physical Interval Note:  11/28/2016 11:53 AM  [name]  has presented today for surgery, with the diagnosis of Chronic cholecystitis.  RUQ abdominal pain  The various methods of treatment have been discussed with the patient and family. After consideration of risks, benefits and other options for treatment, the patient has consented to  Procedure(s): LAPAROSCOPIC CHOLECYSTECTOMY SINGLE SITE WITH INTRAOPERATIVE CHOLANGIOGRAM (N/A) POSSIBLE NEEDLE CORE LIVER BIOPSY (N/A) as a surgical intervention .  The patient's history has been reviewed, patient examined, no change in status, stable for surgery.  I have reviewed the patient's chart and labs.  Questions were answered to the patient's satisfaction.    I have re-reviewed the the patient's records, history, medications, and allergies.  I have re-examined the patient.  I again discussed intraoperative plans and goals of post-operative recovery.  The patient agrees to proceed.  QUINNLAN [name]  1960-06-30 [id number]  Patient Care Team: Jackie Plum, MD as PCP - General (Internal Medicine) Kathi Der, MD as Consulting Physician (Gastroenterology) Karie Soda, MD as Consulting Physician (General Surgery)  Patient Active Problem List   Diagnosis Date Noted  . HYPERKALEMIA 02/20/2010  . PSORIASIS 02/16/2009  . ABDOMINAL PAIN, CHRONIC 11/22/2008  . CHRONIC PANCREATITIS 04/13/2008  . EROSIVE GASTRITIS 02/24/2008  . BLOOD IN STOOL, OCCULT 01/18/2008  . MELENA, HX OF 11/26/2007  . Dyslipidemia 08/13/2007  . TOBACCO ABUSE 04/23/2007    Past Medical History:  Diagnosis Date  . Chronic pancreatitis (HCC)   . Chronic pancreatitis (HCC)   . Depression   . Duodenitis    by EGD 1/07  . Emphysema    unclear diagnosis noted in previous EMR  . Heme positive stool    neg colonoscopies 11/2008 and 01/2008  . History of alcohol abuse   . Hyperlipidemia   . Psoriasis   . Psoriasis    extensive, responds to strong  steroids, unable to start molecular therapies due to cost  . RUE weakness    2/2 cervical spondylosis  . Smoking     Past Surgical History:  Procedure Laterality Date  . TYMPANOPLASTY      Social History   Social History  . Marital status: Divorced    Spouse name: N/A  . Number of children: N/A  . Years of education: N/A   Occupational History  . Not on file.   Social History Main Topics  . Smoking status: Current Every Day Smoker    Packs/day: 1.00    Years: 15.00    Types: Cigarettes  . Smokeless tobacco: Never Used     Comment: PATIENT HAS CUT DOWN ON THE AMOUNT HE SMOKES  . Alcohol use No     Comment:  quit 2008  . Drug use: No     Comment: quit in high school  . Sexual activity: Not on file     Comment: trying for disability to gain medicare coverage, has pain contract and warned that if tested positive again for cocaine will not be given narcotics   Other Topics Concern  . Not on file   Social History Narrative   Financial assistance approved for 100% discount at [facility] and has [facility] card   [facility]  March 08, 2010 2:17 PM    Family History  Problem Relation Age of Onset  . Diabetes Mother   . Heart disease Father   . Cancer Father        ?kidney or liver cancer    Current Facility-Administered Medications  Medication  Dose Route Frequency Provider Last Rate Last Dose  . ceFAZolin (ANCEF) IVPB 2g/100 mL premix  2 g Intravenous On Call to OR Karie SodaGross, Tawyna Pellot, MD       And  . metroNIDAZOLE (FLAGYL) IVPB 500 mg  500 mg Intravenous On Call to OR Karie SodaGross, Finlee Concepcion, MD      . Chlorhexidine Gluconate Cloth 2 % PADS 6 each  6 each Topical Once Karie SodaGross, Eragon Hammond, MD       And  . Chlorhexidine Gluconate Cloth 2 % PADS 6 each  6 each Topical Once Karie SodaGross, Neal Trulson, MD      . fentaNYL (SUBLIMAZE) injection 50 mcg  50 mcg Intravenous PRN Eilene Ghaziose, George, MD   50 mcg at 11/28/16 1140  . lactated ringers infusion   Intravenous Continuous Eilene Ghaziose, George, MD 20 mL/hr at 11/28/16  [phone]       No Known Allergies  BP 117/80 (BP Location: Right Arm)   Pulse 78   Temp 97.9 F (36.6 C) (Oral)   Resp 16   Ht 5\' 8"  (1.727 m)   Wt 63.5 kg (140 lb)   SpO2 94%   BMI 21.29 kg/m   Labs: No results found for this or any previous visit (from the past 48 hour(s)).  Imaging / Studies: No results found.   [name].Dequavion Follette C. Johm Pfannenstiel, M.D., F.A.C.S. Gastrointestinal and Minimally Invasive Surgery [facility] [address] [place], [place] [id number] [phone] Main / Paging  11/28/2016 11:53 AM    Nery Frappier C.

## 2016-11-28 NOTE — Anesthesia Procedure Notes (Signed)
Procedure Name: Intubation Date/Time: 11/28/2016 12:19 PM Performed by: Thornell MuleSTUBBLEFIELD, Winson Eichorn G Pre-anesthesia Checklist: Patient identified, Emergency Drugs available, Suction available and Patient being monitored Patient Re-evaluated:Patient Re-evaluated prior to inductionOxygen Delivery Method: Circle system utilized Preoxygenation: Pre-oxygenation with 100% oxygen Intubation Type: IV induction Ventilation: Mask ventilation without difficulty Laryngoscope Size: Miller and 3 Grade View: Grade I Tube type: Oral Tube size: 7.5 mm Number of attempts: 1 Airway Equipment and Method: Stylet and Oral airway Placement Confirmation: ETT inserted through vocal cords under direct vision,  positive ETCO2 and breath sounds checked- equal and bilateral Secured at: 21 cm Tube secured with: Tape Dental Injury: Teeth and Oropharynx as per pre-operative assessment

## 2016-11-29 ENCOUNTER — Encounter (HOSPITAL_COMMUNITY): Payer: Self-pay | Admitting: Surgery

## 2016-11-29 DIAGNOSIS — K8010 Calculus of gallbladder with chronic cholecystitis without obstruction: Secondary | ICD-10-CM | POA: Diagnosis not present

## 2016-11-29 NOTE — Progress Notes (Signed)
MD ingram aware of O2 saturation, advised due to pt history he is comfortable with d/c if patient at 88% walking. Increased to 90% ambualting after incentive spirometer use. Will continue with D/c.

## 2016-11-29 NOTE — Progress Notes (Signed)
IV removed, dc instructions reviewed, pt understanding. Belongings gathered, patient wheeled down to main entrance to meet brother.

## 2016-11-29 NOTE — Care Management Note (Signed)
Case Management Note  Patient Details  Name: Samuel SaaKenneth T Barrington MRN: [id number] Date of Birth: 1961/03/14  Subjective/Objective:                  PRE-OPERATIVE DIAGNOSIS:    Chronic Calculus cholecystitis  POST-OPERATIVE DIAGNOSIS:   Chronic Calculus cholecystitis  Liver: Fatty steatohepatitis  PROCEDURE:  Core Liver Biopsy & SINGLE SITE Laparoscopic cholecystectomy with intraoperative cholangiogram  Action/Plan: Date: November 29, 2016  Discharge orders review for case management needs.  None found  Marcelle Smilinghonda Davis, BSN, [place]over[place]3, [place]M:  [phone]  Expected Discharge Date:  11/29/16               Expected Discharge Plan:  Home/Self Care  In-House Referral:     Discharge planning Services  CM Consult  Post Acute Care Choice:    Choice offered to:     DME Arranged:    DME Agency:     HH Arranged:    HH Agency:     Status of Service:  Completed, signed off  If discussed at [employer] Length of Stay Meetings, dates discussed:    Additional Comments:  Golda AcreDavis, Rhonda Lynn, RN 11/29/2016, 9:13 AM

## 2016-11-29 NOTE — Discharge Summary (Signed)
  Patient ID: Samuel SaaKenneth T Gehlhausen [id number] 56 y.o. Aug 22, 1960  Admit date: 11/28/2016  Discharge date and time: No discharge date for patient encounter.  Admitting Physician: Karie SodaGross, Steven  Discharge Physician: Ernestene MentionINGRAM,Kasten Leveque M  Admission Diagnoses: Chronic cholecystitis.  RUQ abdominal pain  Discharge Diagnoses: Same  Operations: Procedure(s): LAPAROSCOPIC CHOLECYSTECTOMY SINGLE SITE WITH INTRAOPERATIVE CHOLANGIOGRAM NEEDLE CORE LIVER BIOPSY  Admission Condition: good  Discharged Condition: good  Indication for Admission: 56 year old gentleman with chronic pancreatitis and intermittent heavy alcohol use.  Episodes of upper abdominal pain.  Somewhat suspicious for biliary colic.  Workup inconclusive, but there is no evidence of pancreatic or liver mass and other diagnoses seem  less likely.   Dr. gross recommended diagnostic laparoscopy with cholecystectomy, IOC and probable core liver biopsy.   The patient expresses understanding & wishes to proceed with surgery.  Hospital Course: On the day of admission the patient was taken to the operating room where Dr. gross performed core liver biopsy and single site laparoscopic cholecystectomy with intraoperative cholangiogram.  The gallbladder was found to be dilated with some adhesions to it suspicious for chronic calculus cholecystitis.  The cholangiogram was normal with classic biliary anatomy.  No filling the defect leak or stricture.  The liver appeared to have fatty steatohepatitis.     Postoperatively the patient was observed overnight for pain control and complications.  Other than incisional soreness he did well.  Tolerated diet without nausea.  Ambulatory.  Voiding without difficulty.  He felt ready to go home on postop day 1.  Examination revealed his abdomen was soft with appropriate incisional tenderness.  Incision looked good and the bandage was dry.  He was given instructions in diet and activities.  He was given a prescription for  Percocet for pain.  He was asked to make an appointment to see Dr. gross in 2 weeks.  Consults: None  Significant Diagnostic Studies: Surgical pathology, pending  Treatments: surgery: Single site laparoscopic cholecystectomy with cholangiogram, liver biopsy  Disposition: Home  Patient Instructions:  Allergies as of 11/29/2016   No Known Allergies     Medication List    TAKE these medications   atorvastatin 40 MG tablet Commonly known as:  LIPITOR Take 1 tablet (40 mg total) by mouth daily. What changed:  when to take this   gabapentin 300 MG capsule Commonly known as:  NEURONTIN Take 1 capsule (300 mg total) by mouth 2 (two) times daily. Increase to 4x/day as needed   methocarbamol 750 MG tablet Commonly known as:  ROBAXIN Take 1 tablet (750 mg total) by mouth 4 (four) times daily as needed (use for muscle cramps/pain).   ondansetron 4 MG tablet Commonly known as:  ZOFRAN Take 1 tablet (4 mg total) by mouth every 8 (eight) hours as needed for nausea.   oxyCODONE-acetaminophen 7.5-325 MG tablet Commonly known as:  PERCOCET Take 1 tablet by mouth every 4 (four) hours as needed for moderate pain or severe pain (FOR PAIN.). What changed:  reasons to take this       Activity: No sports or heavy lifting for one month Diet: low fat, low cholesterol diet Wound Care: as directed  Follow-up:  With Dr. gross in 2 weeks.  Signed: Angelia MouldHaywood M. Derrell LollingIngram, M.D., FACS General and minimally invasive surgery Breast and Colorectal Surgery  11/29/2016, 6:09 AM

## 2016-11-29 NOTE — Progress Notes (Signed)
In preparation for d/c this RN checked patient O2 sat on RA at rest which maintained at 86%. MD paged.

## 2016-12-03 ENCOUNTER — Emergency Department (HOSPITAL_COMMUNITY): Payer: Medicaid Other

## 2016-12-03 ENCOUNTER — Inpatient Hospital Stay (HOSPITAL_COMMUNITY)
Admission: EM | Admit: 2016-12-03 | Discharge: 2016-12-06 | DRG: 381 | Disposition: A | Discharge status: Home / Self Care | Payer: Medicaid Other | Attending: General Surgery | Admitting: General Surgery

## 2016-12-03 ENCOUNTER — Encounter (HOSPITAL_COMMUNITY): Payer: Self-pay | Admitting: Emergency Medicine

## 2016-12-03 DIAGNOSIS — K269 Duodenal ulcer, unspecified as acute or chronic, without hemorrhage or perforation: Secondary | ICD-10-CM | POA: Diagnosis present

## 2016-12-03 DIAGNOSIS — K449 Diaphragmatic hernia without obstruction or gangrene: Secondary | ICD-10-CM | POA: Diagnosis present

## 2016-12-03 DIAGNOSIS — F17210 Nicotine dependence, cigarettes, uncomplicated: Secondary | ICD-10-CM | POA: Diagnosis present

## 2016-12-03 DIAGNOSIS — L409 Psoriasis, unspecified: Secondary | ICD-10-CM | POA: Diagnosis present

## 2016-12-03 DIAGNOSIS — K2210 Ulcer of esophagus without bleeding: Principal | ICD-10-CM | POA: Diagnosis present

## 2016-12-03 DIAGNOSIS — F329 Major depressive disorder, single episode, unspecified: Secondary | ICD-10-CM | POA: Diagnosis present

## 2016-12-03 DIAGNOSIS — E785 Hyperlipidemia, unspecified: Secondary | ICD-10-CM | POA: Diagnosis present

## 2016-12-03 DIAGNOSIS — K921 Melena: Secondary | ICD-10-CM | POA: Diagnosis present

## 2016-12-03 DIAGNOSIS — K920 Hematemesis: Secondary | ICD-10-CM | POA: Diagnosis present

## 2016-12-03 DIAGNOSIS — J439 Emphysema, unspecified: Secondary | ICD-10-CM | POA: Diagnosis present

## 2016-12-03 DIAGNOSIS — K2980 Duodenitis without bleeding: Secondary | ICD-10-CM | POA: Diagnosis present

## 2016-12-03 DIAGNOSIS — R710 Precipitous drop in hematocrit: Secondary | ICD-10-CM | POA: Diagnosis not present

## 2016-12-03 DIAGNOSIS — K861 Other chronic pancreatitis: Secondary | ICD-10-CM | POA: Diagnosis present

## 2016-12-03 DIAGNOSIS — Z79899 Other long term (current) drug therapy: Secondary | ICD-10-CM

## 2016-12-03 HISTORY — DX: Gastric ulcer, unspecified as acute or chronic, without hemorrhage or perforation: K25.9

## 2016-12-03 LAB — COMPREHENSIVE METABOLIC PANEL
ALBUMIN: 3 g/dL — AB (ref 3.5–5.0)
ALT: 94 U/L — ABNORMAL HIGH (ref 17–63)
ANION GAP: 12 (ref 5–15)
AST: 40 U/L (ref 15–41)
Alkaline Phosphatase: 149 U/L — ABNORMAL HIGH (ref 38–126)
BUN: 26 mg/dL — ABNORMAL HIGH (ref 6–20)
CHLORIDE: 101 mmol/L (ref 101–111)
CO2: 21 mmol/L — AB (ref 22–32)
Calcium: 8.3 mg/dL — ABNORMAL LOW (ref 8.9–10.3)
Creatinine, Ser: 0.68 mg/dL (ref 0.61–1.24)
GFR calc non Af Amer: >60 mL/min (ref >60)
Glucose, Bld: 127 mg/dL — ABNORMAL HIGH (ref 65–99)
POTASSIUM: 3.7 mmol/L (ref 3.5–5.1)
SODIUM: 134 mmol/L — AB (ref 135–145)
Total Bilirubin: 0.7 mg/dL (ref 0.3–1.2)
Total Protein: 6.3 g/dL — ABNORMAL LOW (ref 6.5–8.1)

## 2016-12-03 LAB — URINALYSIS, ROUTINE W REFLEX MICROSCOPIC
GLUCOSE, UA: NEGATIVE mg/dL
HGB URINE DIPSTICK: NEGATIVE
Ketones, ur: 80 mg/dL — AB
Leukocytes, UA: NEGATIVE
Nitrite: NEGATIVE
PH: 5 (ref 5.0–8.0)
Protein, ur: NEGATIVE mg/dL
SPECIFIC GRAVITY, URINE: 1.027 (ref 1.005–1.030)

## 2016-12-03 LAB — CBC
HCT: 28.8 % — ABNORMAL LOW (ref 39.0–52.0)
HEMOGLOBIN: 9.6 g/dL — AB (ref 13.0–17.0)
MCH: 30.3 pg (ref 26.0–34.0)
MCHC: 33.3 g/dL (ref 30.0–36.0)
MCV: 90.9 fL (ref 78.0–100.0)
Platelets: 209 10*3/uL (ref 150–400)
RBC: 3.17 MIL/uL — AB (ref 4.22–5.81)
RDW: 13.2 % (ref 11.5–15.5)
WBC: 16.1 10*3/uL — ABNORMAL HIGH (ref 4.0–10.5)

## 2016-12-03 LAB — ETHANOL: Alcohol, Ethyl (B): <5 mg/dL (ref <5)

## 2016-12-03 LAB — I-STAT CG4 LACTIC ACID, ED
LACTIC ACID, VENOUS: 2.36 mmol/L — AB (ref 0.5–1.9)
LACTIC ACID, VENOUS: 1.3 mmol/L (ref 0.5–1.9)

## 2016-12-03 LAB — LIPASE, BLOOD: Lipase: 15 U/L (ref 11–51)

## 2016-12-03 MED ORDER — IOPAMIDOL (ISOVUE-300) INJECTION 61%
INTRAVENOUS | Status: AC
  Administered 2016-12-03: 100 mL
  Filled 2016-12-03: qty 100

## 2016-12-03 MED ORDER — ACETAMINOPHEN 325 MG PO TABS: 650.0000 mg | ORAL_TABLET | Freq: Four times a day (QID) | ORAL | Status: DC | PRN

## 2016-12-03 MED ORDER — IPRATROPIUM-ALBUTEROL 0.5-2.5 (3) MG/3ML IN SOLN
3.0000 mL | Freq: Four times a day (QID) | RESPIRATORY_TRACT | Status: DC | PRN
  Administered 2016-12-05: 3 mL via RESPIRATORY_TRACT
  Filled 2016-12-03: qty 3

## 2016-12-03 MED ORDER — ONDANSETRON HCL 4 MG/2ML IJ SOLN: 4.0000 mg | Freq: Four times a day (QID) | INTRAMUSCULAR | Status: DC | PRN

## 2016-12-03 MED ORDER — HYDRALAZINE HCL 20 MG/ML IJ SOLN: 10.0000 mg | INTRAMUSCULAR | Status: DC | PRN

## 2016-12-03 MED ORDER — ONDANSETRON 4 MG PO TBDP: 4.0000 mg | ORAL_TABLET | Freq: Four times a day (QID) | ORAL | Status: DC | PRN

## 2016-12-03 MED ORDER — ONDANSETRON 4 MG PO TBDP
ORAL_TABLET | ORAL | Status: AC
  Filled 2016-12-03: qty 1

## 2016-12-03 MED ORDER — MORPHINE SULFATE (PF) 4 MG/ML IV SOLN
4.0000 mg | Freq: Once | INTRAVENOUS | Status: AC
  Administered 2016-12-03: 4 mg via INTRAVENOUS
  Filled 2016-12-03: qty 1

## 2016-12-03 MED ORDER — SODIUM CHLORIDE 0.9 % IV BOLUS (SEPSIS)
2000.0000 mL | Freq: Once | INTRAVENOUS | Status: AC
  Administered 2016-12-03: 2000 mL via INTRAVENOUS

## 2016-12-03 MED ORDER — PANTOPRAZOLE SODIUM 40 MG IV SOLR
40.0000 mg | Freq: Every day | INTRAVENOUS | Status: DC
  Administered 2016-12-03: 40 mg via INTRAVENOUS
  Filled 2016-12-03 (×2): qty 40

## 2016-12-03 MED ORDER — HYDROMORPHONE HCL 1 MG/ML IJ SOLN
1.0000 mg | INTRAMUSCULAR | Status: DC | PRN
  Administered 2016-12-03 – 2016-12-05 (×10): 1 mg via INTRAVENOUS
  Filled 2016-12-03 (×10): qty 1

## 2016-12-03 MED ORDER — ONDANSETRON 4 MG PO TBDP
4.0000 mg | ORAL_TABLET | Freq: Once | ORAL | Status: AC | PRN
  Administered 2016-12-03: 4 mg via ORAL

## 2016-12-03 MED ORDER — KCL IN DEXTROSE-NACL 20-5-0.45 MEQ/L-%-% IV SOLN
INTRAVENOUS | Status: DC
  Administered 2016-12-03 – 2016-12-05 (×7): via INTRAVENOUS
  Filled 2016-12-03 (×7): qty 1000

## 2016-12-03 MED ORDER — SODIUM CHLORIDE 0.9% FLUSH
10.0000 mL | INTRAVENOUS | Status: DC | PRN
  Administered 2016-12-04: 10 mL
  Filled 2016-12-03: qty 40

## 2016-12-03 MED ORDER — HYDROMORPHONE HCL 1 MG/ML IJ SOLN
1.0000 mg | Freq: Once | INTRAMUSCULAR | Status: AC
  Administered 2016-12-03: 1 mg via INTRAVENOUS
  Filled 2016-12-03: qty 1

## 2016-12-03 MED ORDER — PANTOPRAZOLE SODIUM 40 MG IV SOLR
40.0000 mg | Freq: Once | INTRAVENOUS | Status: AC
  Administered 2016-12-03: 40 mg via INTRAVENOUS
  Filled 2016-12-03: qty 40

## 2016-12-03 MED ORDER — ACETAMINOPHEN 650 MG RE SUPP: 650.0000 mg | Freq: Four times a day (QID) | RECTAL | Status: DC | PRN

## 2016-12-03 MED ORDER — DIPHENHYDRAMINE HCL 25 MG PO CAPS: 25.0000 mg | ORAL_CAPSULE | Freq: Four times a day (QID) | ORAL | Status: DC | PRN

## 2016-12-03 MED ORDER — DIPHENHYDRAMINE HCL 50 MG/ML IJ SOLN: 25.0000 mg | Freq: Four times a day (QID) | INTRAMUSCULAR | Status: DC | PRN

## 2016-12-03 NOTE — ED Notes (Signed)
Attempted report x 2 

## 2016-12-03 NOTE — ED Notes (Signed)
Attempted report x1. 

## 2016-12-03 NOTE — Progress Notes (Deleted)
[facility] Admission Note  [name] 1960/08/29  [id number].    Requesting MD: Darl Householder, MD Chief Complaint/Reason for Consult: post-operative abdominal pain  HPI:  [name] is a 56 y/o male with PMH alcoholism (last drink 10 years ago), chronic pancreatitis, hyperlipidemia, COPD, tobacco abuse, and duodenitis who presented to [facility] with severe abdominal pain. He is POD#5 from laparoscopic cholecystectomy by Dr. Johney Maine for his chronic pancreatitis. Patient was discharged home on 11/29/16 in good condition. He reports 2 dark episodes of diarrhea on the evening of 6/8 followed by 2 black episodes of emesis; no further BMs since that time. Since this day he reports worsening, burning epigastric pain with radiation to his chest and across his upper abdomen. States the pain is constant but he has intermittent episodes where the pain becomes even more severe. He also endorses severe acid reflux that "burns his teeth". He has been unable to tolerate PO intake, stating that even ice chips cause severe epigastric pain and reflux. He is not having further vomiting but is spitting up brown mucous. He is a current daily smoker. Takes percocet at home for chronic pancreatitis. Denies use of blood thinners. Denies PMH MI or CVA. He reports a history of stomach ulcer but I am unable to find chart review/previous endoscopies to support this diagnosis.  Most recent upper endoscopy performed by Dr. Lia Foyer 12/15/2008 - Duodenitis was found. Signicant erythema and edema in post bulbar duodenum with lumenal narrowing (see image3 and image4). Able to pass scope through area without difficulty  Esophagitis  in the distal esophagus. Friable mucosa with erosive changes at GE junction (see image6 and image1). Bxs taken to r/o Barrett's esophagus (these were negative for dysplasia/malignancy), Otherwise the examination was normal.  ED workup: Lipase WNL, ALT 94, Alk Phos 149, WBC 16.1. hgb 9.6, hct 28.8, lactic acid  2.36 >> 1.30 s/p IVF  CT ABD/PELV -  1. CT findings suggest distal esophagitis and duodenitis. 2. Expected postoperative changes from recent cholecystectomy. No findings for abscess, bile leak or hematoma.   ROS: Review of Systems  Constitutional: Negative for chills and fever.  Respiratory: Negative for shortness of breath.   Cardiovascular: Negative for chest pain and palpitations.  Gastrointestinal: Positive for abdominal pain, constipation and vomiting.  All other systems reviewed and are negative.   Family History  Problem Relation Age of Onset  . Diabetes Mother   . Heart disease Father   . Cancer Father        ?kidney or liver cancer    Past Medical History:  Diagnosis Date  . Chronic pancreatitis ([place])   . Chronic pancreatitis ([place])   . Depression   . Duodenitis    by EGD 1/07  . Emphysema    unclear diagnosis noted in previous EMR  . Heme positive stool    neg colonoscopies 11/2008 and 01/2008  . History of alcohol abuse   . Hyperlipidemia   . Psoriasis   . Psoriasis    extensive, responds to strong steroids, unable to start molecular therapies due to cost  . RUE weakness    2/2 cervical spondylosis  . Smoking     Past Surgical History:  Procedure Laterality Date  . LAPAROSCOPIC CHOLECYSTECTOMY SINGLE SITE WITH INTRAOPERATIVE CHOLANGIOGRAM N/A 11/28/2016   Procedure: LAPAROSCOPIC CHOLECYSTECTOMY SINGLE SITE WITH INTRAOPERATIVE CHOLANGIOGRAM;  Surgeon: Michael Boston, MD;  Location: [facility]S;  Service: General;  Laterality: N/A;  . LIVER BIOPSY N/A 11/28/2016   Procedure: NEEDLE CORE LIVER BIOPSY;  Surgeon: Michael Boston, MD;  Location: [facility] ORS;  Service: General;  Laterality: N/A;  . TYMPANOPLASTY      Social History:  reports that he has been smoking Cigarettes.  He has a 15.00 pack-year smoking history. He has never used smokeless tobacco. He reports that he does not drink alcohol or use drugs.  Allergies: No Known Allergies   (Not in a hospital  admission)  Blood pressure 119/74, pulse 92, temperature 98.3 F (36.8 C), temperature source Oral, resp. rate 18, height '5\' 8"'  (1.727 m), weight 63.5 kg (140 lb), SpO2 99 %. Physical Exam: Physical Exam  Constitutional: He is oriented to person, place, and time. He appears well-developed and well-nourished. He appears distressed.  HENT:  Head: Normocephalic and atraumatic.  Right Ear: External ear normal.  Left Ear: External ear normal.  Eyes: Conjunctivae are normal. No scleral icterus.  Neck: Normal range of motion. Neck supple. No tracheal deviation present.  Cardiovascular: Normal rate, regular rhythm, normal heart sounds and intact distal pulses.   Pulmonary/Chest: Effort normal and breath sounds normal. No stridor. No respiratory distress. He has no wheezes. He exhibits no tenderness.  Abdominal: Soft. He exhibits distension. He exhibits no mass. There is tenderness. There is guarding. No hernia.  Diffuse abdominal pain, worse over epigastrium, with guarding. Hypoactive bowel sounds.  Musculoskeletal: Normal range of motion. He exhibits no edema or deformity.  Neurological: He is alert and oriented to person, place, and time. No sensory deficit.  Skin: Skin is warm. No rash noted. He is diaphoretic. No erythema.  Psychiatric: He has a normal mood and affect. His behavior is normal.    Results for orders placed or performed during the hospital encounter of 12/03/16 (from the past 48 hour(s))  Lipase, blood     Status: None   Collection Time: 12/03/16 12:10 PM  Result Value Ref Range   Lipase 15 11 - 51 U/L  Comprehensive metabolic panel     Status: Abnormal   Collection Time: 12/03/16 12:10 PM  Result Value Ref Range   Sodium 134 (L) 135 - 145 mmol/L   Potassium 3.7 3.5 - 5.1 mmol/L   Chloride 101 101 - 111 mmol/L   CO2 21 (L) 22 - 32 mmol/L   Glucose, Bld 127 (H) 65 - 99 mg/dL   BUN 26 (H) 6 - 20 mg/dL   Creatinine, Ser 0.68 0.61 - 1.24 mg/dL   Calcium 8.3 (L) 8.9 - 10.3  mg/dL   Total Protein 6.3 (L) 6.5 - 8.1 g/dL   Albumin 3.0 (L) 3.5 - 5.0 g/dL   AST 40 15 - 41 U/L   ALT 94 (H) 17 - 63 U/L   Alkaline Phosphatase 149 (H) 38 - 126 U/L   Total Bilirubin 0.7 0.3 - 1.2 mg/dL   GFR calc non Af Amer >60 >60 mL/min   GFR calc Af Amer >60 >60 mL/min    Comment: (NOTE) The eGFR has been calculated using the CKD EPI equation. This calculation has not been validated in all clinical situations. eGFR's persistently <60 mL/min signify possible Chronic Kidney Disease.    Anion gap 12 5 - 15  CBC     Status: Abnormal   Collection Time: 12/03/16 12:10 PM  Result Value Ref Range   WBC 16.1 (H) 4.0 - 10.5 K/uL   RBC 3.17 (L) 4.22 - 5.81 MIL/uL   Hemoglobin 9.6 (L) 13.0 - 17.0 g/dL   HCT 28.8 (L) 39.0 - 52.0 %   MCV 90.9 78.0 -  100.0 fL   MCH 30.3 26.0 - 34.0 pg   MCHC 33.3 30.0 - 36.0 g/dL   RDW 13.2 11.5 - 15.5 %   Platelets 209 150 - 400 K/uL  Urinalysis, Routine w reflex microscopic     Status: Abnormal   Collection Time: 12/03/16 12:16 PM  Result Value Ref Range   Color, Urine YELLOW YELLOW   APPearance CLEAR CLEAR   Specific Gravity, Urine 1.027 1.005 - 1.030   pH 5.0 5.0 - 8.0   Glucose, UA NEGATIVE NEGATIVE mg/dL   Hgb urine dipstick NEGATIVE NEGATIVE   Bilirubin Urine MODERATE (A) NEGATIVE   Ketones, ur 80 (A) NEGATIVE mg/dL   Protein, ur NEGATIVE NEGATIVE mg/dL   Nitrite NEGATIVE NEGATIVE   Leukocytes, UA NEGATIVE NEGATIVE  I-Stat CG4 Lactic Acid, ED     Status: Abnormal   Collection Time: 12/03/16 12:49 PM  Result Value Ref Range   Lactic Acid, Venous 2.36 (HH) 0.5 - 1.9 mmol/L   Comment NOTIFIED PHYSICIAN   Ethanol     Status: None   Collection Time: 12/03/16  1:25 PM  Result Value Ref Range   Alcohol, Ethyl (B) <5 <5 mg/dL    Comment:        LOWEST DETECTABLE LIMIT FOR SERUM ALCOHOL IS 5 mg/dL FOR MEDICAL PURPOSES ONLY   I-Stat CG4 Lactic Acid, ED     Status: None   Collection Time: 12/03/16  3:47 PM  Result Value Ref Range    Lactic Acid, Venous 1.30 0.5 - 1.9 mmol/L   Dg Chest 2 View  Result Date: 12/03/2016 CLINICAL DATA:  56 year old who underwent cholecystectomy on 11/28/2016, presenting with acute onset of fever, chills, epigastric abdominal pain, nausea and vomiting, and shortness of breath. Current smoker with current history of COPD. EXAM: CHEST  2 VIEW COMPARISON:  08/31/2011, 12/28/2008 and earlier. FINDINGS: Cardiac silhouette normal in size, unchanged. Thoracic aorta minimally atherosclerotic. Hilar and mediastinal contours otherwise unremarkable. Emphysematous changes throughout both lungs, prominent bronchovascular markings diffusely and moderate central peribronchial thickening, unchanged. Parenchymal scarring involving the right lower lobe, unchanged. Lungs otherwise clear. No confluent airspace consolidation. No pleural effusions. Visualized bony thorax intact. IMPRESSION: COPD/emphysema. Scarring involving the right lower lobe. No acute cardiopulmonary disease. Electronically Signed   By: Evangeline Dakin M.D.   On: 12/03/2016 15:05   Ct Abdomen Pelvis W Contrast  Result Date: 12/03/2016 CLINICAL DATA:  History of recent cholecystectomy last week with upper abdominal pain, nausea and vomiting after eating or drinking for the last 3 days. EXAM: CT ABDOMEN AND PELVIS WITH CONTRAST TECHNIQUE: Multidetector CT imaging of the abdomen and pelvis was performed using the standard protocol following bolus administration of intravenous contrast. CONTRAST:  143m ISOVUE-300 IOPAMIDOL (ISOVUE-300) INJECTION 61% COMPARISON:  CT scan 09/02/2016 FINDINGS: Lower chest: Stable large right middle lobe colo, emphysematous changes and scarring. The distal esophagus demonstrates moderate wall thickening and edema which could be acute esophagitis. Recommend clinical correlation with any symptoms of dysphasia. Hepatobiliary: Focal area of low attenuation involving the right hepatic lobe near the gallbladder fossa of which could be  some residual local inflammation or possibly the intraoperative contusion. No laceration or hemorrhage. There is hazy interstitial changes in the gallbladder fossa fat which is not unexpected this in after surgery. No focal fluid collection suspicious for a bile leak. No abscess or hematoma. Normal caliber common bile duct. Pancreas: No mass, inflammation or ductal dilatation. Stable cluster of calcifications in the pancreatic head likely the sequela of prior focal pancreatitis.  Spleen: Normal size.  No focal lesions. Adrenals/Urinary Tract: The adrenal glands and kidneys are unremarkable and stable. Small right renal calculus but no ureteral or bladder calculi and no renal or bladder mass. Stomach/Bowel: The stomach is unremarkable. There is moderate wall thickening involving the second and third portions of the duodenum with submucosal edema and mucosal enhancement. Findings likely represent duodenitis. The remaining small bowel and colon are unremarkable. Contrast and stool noted in the colon. The terminal ileum is normal. The appendix is normal. Vascular/Lymphatic: Advanced atherosclerotic calcifications involving the aorta but no aneurysm or dissection. The major venous structures are patent. Reproductive: The prostate gland and seminal vesicles are unremarkable. Other: No pelvic mass or adenopathy. No free pelvic fluid collections. No inguinal mass or adenopathy. No abdominal wall hernia or subcutaneous lesions. Musculoskeletal: No significant bony findings. 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 IMPRESSION: 1. CT findings suggest distal esophagitis and duodenitis. 2. Expected postoperative changes from recent cholecystectomy. No findings for abscess, bile leak or hematoma. Electronically Signed   By: Marijo Sanes M.D.   On: 12/03/2016 15:07   Assessment/Plan 1. Epigastric abdominal pain 2. Duodenitis/esophagitis   - S/P laparoscopic  cholecystectomy 11/28/16; port sites c/d/i, no evidence of leak on CT   - CT scan suggests duodenitis/esophagitis which is consistent with patient history; no free air. GI consult for possible upper endoscopy to r/o gastric/duodenal ulcer or UGI bleed.   - IV pain medication PRN, PRN antiemetics   - NPO  3. Leukocytosis - suspect inflammatory, no signs of post-operative infection. No free air to suggest perforated viscus. Will repeat CBC in AM and hold off on empiric abx. Appreciate GI assistance. If GI feels abx are appropriate then please order. 4. Normocytic anemia - hgb 9.6, CBC in AM 5. Chronic pancreatitis - continue pain control, Lipase normal 6. Tobacco abuse/COPD - PRN duonebs   7. Hyperlipidemia - hold home lipitor while NPO  FEN: NPO, IVF ID: none VTE: [place], [facility] 12/03/2016, 4:03 PM Pager: [phone] Consults: [phone] Mon-Fri 7:00 am-4:30 pm Sat-Sun 7:00 am-11:30 am

## 2016-12-03 NOTE — ED Triage Notes (Signed)
Pt to ER for evaluation of mid generalized abd pain that has persistently worsened since cholecystectomy last Thursday. States had a bowel movement Friday, however has not had one since and has been nauseous with countless episodes of vomiting. States has not been able to tolerate po fluids. BP at present 90/69, patient is pale.

## 2016-12-03 NOTE — ED Notes (Signed)
Patient transported to CT 

## 2016-12-03 NOTE — ED Provider Notes (Signed)
[facility]-EMERGENCY DEPT Provider Note   CSN: [id number] Arrival date & time: 12/03/16  1141     History   Chief Complaint Chief Complaint  Patient presents with  . Abdominal Pain    HPI [name] is a 56 y.o. male history of chronic pancreatitis, depression, duodenitis previous alcohol abuse who presenting with abdominal pain. Patient had elective cholecystectomy on 6/7 by Dr. Michaell Cowing and was discharged the follow day. Patient states that the pain improved initially and then several hours after he went home he had severe abdominal pain. States that was worse in the epigastric area. It is associated with vomiting and he was unable to keep down even his pain medicine. Denies any fevers or chills. Patient states that he has no bowel movement since the surgery. States that he did not drink alcohol since the surgery and did not have a previous history of SBO.   The history is provided by the patient.    Past Medical History:  Diagnosis Date  . Chronic pancreatitis (HCC)   . Chronic pancreatitis (HCC)   . Depression   . Duodenitis    by EGD 1/07  . Emphysema    unclear diagnosis noted in previous EMR  . Heme positive stool    neg colonoscopies 11/2008 and 01/2008  . History of alcohol abuse   . Hyperlipidemia   . Psoriasis   . Psoriasis    extensive, responds to strong steroids, unable to start molecular therapies due to cost  . RUE weakness    2/2 cervical spondylosis  . Smoking     Patient Active Problem List   Diagnosis Date Noted  . Chronic cholecystitis with calculus s/p lap cholecystectomy 11/28/2016 11/28/2016  . Alcoholic fatty liver 11/28/2016  . Psoriasis   . Duodenitis   . HYPERKALEMIA 02/20/2010  . PSORIASIS 02/16/2009  . Abdominal pain 11/22/2008  . Chronic pancreatitis (HCC) 04/13/2008  . EROSIVE GASTRITIS 02/24/2008  . BLOOD IN STOOL, OCCULT 01/18/2008  . MELENA, HX OF 11/26/2007  . Dyslipidemia 08/13/2007  . TOBACCO ABUSE 04/23/2007    Past Surgical  History:  Procedure Laterality Date  . LAPAROSCOPIC CHOLECYSTECTOMY SINGLE SITE WITH INTRAOPERATIVE CHOLANGIOGRAM N/A 11/28/2016   Procedure: LAPAROSCOPIC CHOLECYSTECTOMY SINGLE SITE WITH INTRAOPERATIVE CHOLANGIOGRAM;  Surgeon: Karie Soda, MD;  Location: [facility];  Service: General;  Laterality: N/A;  . LIVER BIOPSY N/A 11/28/2016   Procedure: NEEDLE CORE LIVER BIOPSY;  Surgeon: Karie Soda, MD;  Location: [facility];  Service: General;  Laterality: N/A;  . TYMPANOPLASTY         Home Medications    Prior to Admission medications   Medication Sig Start Date End Date Taking? Authorizing Provider  atorvastatin (LIPITOR) 40 MG tablet Take 1 tablet (40 mg total) by mouth daily. Patient taking differently: Take 40 mg by mouth at bedtime.  10/15/16 01/13/17  Pricilla Riffle, MD  gabapentin (NEURONTIN) 300 MG capsule Take 1 capsule (300 mg total) by mouth 2 (two) times daily. Increase to 4x/day as needed 11/28/16   Karie Soda, MD  methocarbamol (ROBAXIN) 750 MG tablet Take 1 tablet (750 mg total) by mouth 4 (four) times daily as needed (use for muscle cramps/pain). 11/28/16   Karie Soda, MD  ondansetron (ZOFRAN) 4 MG tablet Take 1 tablet (4 mg total) by mouth every 8 (eight) hours as needed for nausea. 11/28/16   Karie Soda, MD  oxyCODONE-acetaminophen (PERCOCET) 7.5-325 MG tablet Take 1 tablet by mouth every 4 (four) hours as needed for moderate pain or  severe pain (FOR PAIN.). 11/28/16   Karie Soda, MD    Family History Family History  Problem Relation Age of Onset  . Diabetes Mother   . Heart disease Father   . Cancer Father        ?kidney or liver cancer    Social History Social History  Substance Use Topics  . Smoking status: Current Every Day Smoker    Packs/day: 1.00    Years: 15.00    Types: Cigarettes  . Smokeless tobacco: Never Used     Comment: PATIENT HAS CUT DOWN ON THE AMOUNT HE SMOKES  . Alcohol use No     Comment:  quit 2008     Allergies   Patient has no known  allergies.   Review of Systems Review of Systems  Gastrointestinal: Positive for abdominal pain, constipation and vomiting.  All other systems reviewed and are negative.    Physical Exam Updated Vital Signs BP 112/67   Pulse 100   Temp 98.3 F (36.8 C) (Oral)   Resp 16   Ht 5\' 8"  (1.727 m)   Wt 63.5 kg (140 lb)   SpO2 100%   BMI 21.29 kg/m   Physical Exam  Constitutional:  Uncomfortable   HENT:  Head: Normocephalic.  Mouth/Throat: Oropharynx is clear and moist.  Eyes: EOM are normal. Pupils are equal, round, and reactive to light.  Neck: Normal range of motion. Neck supple.  Cardiovascular: Normal rate, regular rhythm and normal heart sounds.   Pulmonary/Chest: Effort normal and breath sounds normal. No respiratory distress. He has no wheezes. He has no rales.  Abdominal:  Distended, diffusely tender, worse in epigastrium. Surgical scars healing well with no obvious discharge or infection   Musculoskeletal: Normal range of motion.  Neurological: He is alert.  Skin: Skin is warm.  Nursing note and vitals reviewed.    ED Treatments / Results  Labs (all labs ordered are listed, but only abnormal results are displayed) Labs Reviewed  COMPREHENSIVE METABOLIC PANEL - Abnormal; Notable for the following:       Result Value   Sodium 134 (*)    CO2 21 (*)    Glucose, Bld 127 (*)    BUN 26 (*)    Calcium 8.3 (*)    Total Protein 6.3 (*)    Albumin 3.0 (*)    ALT 94 (*)    Alkaline Phosphatase 149 (*)    All other components within normal limits  CBC - Abnormal; Notable for the following:    WBC 16.1 (*)    RBC 3.17 (*)    Hemoglobin 9.6 (*)    HCT 28.8 (*)    All other components within normal limits  URINALYSIS, ROUTINE W REFLEX MICROSCOPIC - Abnormal; Notable for the following:    Bilirubin Urine MODERATE (*)    Ketones, ur 80 (*)    All other components within normal limits  I-STAT CG4 LACTIC ACID, ED - Abnormal; Notable for the following:    Lactic Acid,  Venous 2.36 (*)    All other components within normal limits  CULTURE, BLOOD (ROUTINE X 2)  CULTURE, BLOOD (ROUTINE X 2)  LIPASE, BLOOD  ETHANOL    EKG  EKG Interpretation None       Radiology No results found.  Procedures Procedures (including critical care time)  EMERGENCY DEPARTMENT [place] FAST EXAM "Limited Ultrasound of the Abdomen and Pericardium" (FAST Exam).   INDICATIONS:Abnornal vitals Multiple views of the abdomen and pericardium are obtained with a  multi-frequency probe.  PERFORMED BY: Myself IMAGES ARCHIVED?: Yes LIMITATIONS:  Body habitus INTERPRETATION:  No abdominal free fluid    Medications Ordered in ED Medications  ondansetron (ZOFRAN-ODT) 4 MG disintegrating tablet (not administered)  sodium chloride flush (NS) 0.9 % injection 10-40 mL (not administered)  iopamidol (ISOVUE-300) 61 % injection (not administered)  ondansetron (ZOFRAN-ODT) disintegrating tablet 4 mg (4 mg Oral Given 12/03/16 1200)  sodium chloride 0.9 % bolus 2,000 mL (2,000 mLs Intravenous New Bag/Given 12/03/16 1342)  morphine 4 MG/ML injection 4 mg (4 mg Intravenous Given 12/03/16 1342)  pantoprazole (PROTONIX) injection 40 mg (40 mg Intravenous Given 12/03/16 1341)     Initial Impression / Assessment and Plan / ED Course  I have reviewed the triage vital signs and the nursing notes.  Pertinent labs & imaging results that were available during my care of the patient were reviewed by me and considered in my medical decision making (see chart for details).     Dreama SaaKenneth T Lodico is a 56 y.o. male here with ab pain, hypotension POD #5 s/p cholecystectomy. Hypotensive and tachy on arrival. Appears dehydrated. Had no bowel movement since surgery. Consider SBO vs ileus vs post op infection. Will get labs, cultures, lactate. Will get acute abdominal series to r/o free air and likely need CT ab/pel,.   2 pm Hg dropped to 9.6 from 15 several days ago. Bedside FAST neg. CT and xrays pending.    3:30 pm CT showed duodenitis and esophagitis but no SBO or perforation. Lactate 2.4. Given IVF. Consulted surgery.   4:16 PM Surgery saw patient and will admit. Thinks likely gastric ulcer. Given protonix and IVF. Lactate now 1.3. Held abx for now as there is no clear source. Surgery to admit.   Final Clinical Impressions(s) / ED Diagnoses   Final diagnoses:  None    New Prescriptions New Prescriptions   No medications on file     Charlynne PanderYao, Saphia Vanderford Hsienta, MD 12/03/16 [phone]

## 2016-12-04 ENCOUNTER — Encounter (HOSPITAL_COMMUNITY): Payer: Self-pay

## 2016-12-04 ENCOUNTER — Observation Stay (HOSPITAL_COMMUNITY): Payer: Medicaid Other | Admitting: Anesthesiology

## 2016-12-04 ENCOUNTER — Encounter (HOSPITAL_COMMUNITY): Admission: EM | Disposition: A | Discharge status: Home / Self Care | Payer: Self-pay | Source: Home / Self Care

## 2016-12-04 HISTORY — PX: ESOPHAGOGASTRODUODENOSCOPY (EGD) WITH PROPOFOL: SHX5813

## 2016-12-04 LAB — CBC
HCT: 23.2 % — ABNORMAL LOW (ref 39.0–52.0)
HEMATOCRIT: 22.8 % — AB (ref 39.0–52.0)
HEMOGLOBIN: 7.5 g/dL — AB (ref 13.0–17.0)
Hemoglobin: 7.4 g/dL — ABNORMAL LOW (ref 13.0–17.0)
MCH: 30 pg (ref 26.0–34.0)
MCH: 29.8 pg (ref 26.0–34.0)
MCHC: 32.5 g/dL (ref 30.0–36.0)
MCHC: 32.3 g/dL (ref 30.0–36.0)
MCV: 92.3 fL (ref 78.0–100.0)
MCV: 92.1 fL (ref 78.0–100.0)
PLATELETS: 180 10*3/uL (ref 150–400)
Platelets: 192 10*3/uL (ref 150–400)
RBC: 2.47 MIL/uL — AB (ref 4.22–5.81)
RBC: 2.52 MIL/uL — AB (ref 4.22–5.81)
RDW: 13.6 % (ref 11.5–15.5)
RDW: 13.6 % (ref 11.5–15.5)
WBC: 12 10*3/uL — AB (ref 4.0–10.5)
WBC: 8.9 10*3/uL (ref 4.0–10.5)

## 2016-12-04 LAB — COMPREHENSIVE METABOLIC PANEL
ALT: 59 U/L (ref 17–63)
AST: 28 U/L (ref 15–41)
Albumin: 2.3 g/dL — ABNORMAL LOW (ref 3.5–5.0)
Alkaline Phosphatase: 105 U/L (ref 38–126)
Anion gap: 7 (ref 5–15)
BILIRUBIN TOTAL: 0.2 mg/dL — AB (ref 0.3–1.2)
BUN: 15 mg/dL (ref 6–20)
CO2: 22 mmol/L (ref 22–32)
Calcium: 7.5 mg/dL — ABNORMAL LOW (ref 8.9–10.3)
Chloride: 107 mmol/L (ref 101–111)
Creatinine, Ser: 0.57 mg/dL — ABNORMAL LOW (ref 0.61–1.24)
Glucose, Bld: 150 mg/dL — ABNORMAL HIGH (ref 65–99)
POTASSIUM: 4 mmol/L (ref 3.5–5.1)
Sodium: 136 mmol/L (ref 135–145)
TOTAL PROTEIN: 4.8 g/dL — AB (ref 6.5–8.1)

## 2016-12-04 LAB — TYPE AND SCREEN
ABO/RH(D): O POS
ANTIBODY SCREEN: NEGATIVE

## 2016-12-04 LAB — HIV ANTIBODY (ROUTINE TESTING W REFLEX): HIV Screen 4th Generation wRfx: NONREACTIVE

## 2016-12-04 SURGERY — ESOPHAGOGASTRODUODENOSCOPY (EGD) WITH PROPOFOL
Anesthesia: Monitor Anesthesia Care

## 2016-12-04 MED ORDER — PANTOPRAZOLE SODIUM 40 MG IV SOLR
40.0000 mg | Freq: Two times a day (BID) | INTRAVENOUS | Status: DC
  Administered 2016-12-04 – 2016-12-06 (×4): 40 mg via INTRAVENOUS
  Filled 2016-12-04 (×4): qty 40

## 2016-12-04 MED ORDER — PHENYLEPHRINE HCL 10 MG/ML IJ SOLN
INTRAMUSCULAR | Status: DC | PRN
  Administered 2016-12-04 (×3): 80 ug via INTRAVENOUS

## 2016-12-04 MED ORDER — SUCRALFATE 1 GM/10ML PO SUSP
1.0000 g | Freq: Three times a day (TID) | ORAL | Status: DC
  Administered 2016-12-04 – 2016-12-06 (×8): 1 g via ORAL
  Filled 2016-12-04 (×8): qty 10

## 2016-12-04 MED ORDER — LACTATED RINGERS IV SOLN
INTRAVENOUS | Status: DC | PRN
  Administered 2016-12-04: 15:00:00 via INTRAVENOUS

## 2016-12-04 MED ORDER — PROPOFOL 500 MG/50ML IV EMUL
INTRAVENOUS | Status: DC | PRN
  Administered 2016-12-04: 150 ug/kg/min via INTRAVENOUS

## 2016-12-04 MED ORDER — PROPOFOL 10 MG/ML IV BOLUS
INTRAVENOUS | Status: DC | PRN
  Administered 2016-12-04: 30 mg via INTRAVENOUS

## 2016-12-04 MED ORDER — MAGIC MOUTHWASH W/LIDOCAINE
5.0000 mL | Freq: Four times a day (QID) | ORAL | Status: DC
  Administered 2016-12-04 – 2016-12-06 (×8): 5 mL via ORAL
  Filled 2016-12-04 (×11): qty 5

## 2016-12-04 SURGICAL SUPPLY — 15 items

## 2016-12-04 NOTE — Anesthesia Preprocedure Evaluation (Signed)
Anesthesia Evaluation  Patient identified by MRN, date of birth, ID band Patient awake    Reviewed: Allergy & Precautions, H&P , NPO status , Patient's Chart, lab work & pertinent test results  Airway Mallampati: II   Neck ROM: full    Dental   Pulmonary COPD, Current Smoker,    breath sounds clear to auscultation       Cardiovascular negative cardio ROS   Rhythm:regular Rate:Normal     Neuro/Psych PSYCHIATRIC DISORDERS Depression    GI/Hepatic PUD, (+)     substance abuse  alcohol use, Chronic pancreatitis   Endo/Other    Renal/GU      Musculoskeletal   Abdominal   Peds  Hematology  (+) anemia ,   Anesthesia Other Findings   Reproductive/Obstetrics                             Anesthesia Physical Anesthesia Plan  ASA: III  Anesthesia Plan: MAC   Post-op Pain Management:    Induction: Intravenous  PONV Risk Score and Plan: 0 and Propofol  Airway Management Planned: Nasal Cannula  Additional Equipment:   Intra-op Plan:   Post-operative Plan:   Informed Consent: I have reviewed the patients History and Physical, chart, labs and discussed the procedure including the risks, benefits and alternatives for the proposed anesthesia with the patient or authorized representative who has indicated his/her understanding and acceptance.     Plan Discussed with: CRNA and Anesthesiologist  Anesthesia Plan Comments:         Anesthesia Quick Evaluation

## 2016-12-04 NOTE — Op Note (Signed)
[facility] Patient Name: [name] Procedure Date : 12/04/2016 MRN: [id number] Attending MD: Kathi Der , MD Date of Birth: [birth year] CSN: [id number] Age: 56 Admit Type: Inpatient Procedure:                Upper GI endoscopy Indications:              Epigastric abdominal pain, Hematemesis, Melena,                            Abnormal CT of the GI tract Providers:                Kathi Der, MD, Norman Clay, RN, Oletha Blend, Technician Referring MD:              Medicines:                Sedation Administered by an Anesthesia Professional Complications:            No immediate complications. Estimated Blood Loss:     Estimated blood loss was minimal. Procedure:                Pre-Anesthesia Assessment:                           - Prior to the procedure, a History and Physical                            was performed, and patient medications and                            allergies were reviewed. The patient's tolerance of                            previous anesthesia was also reviewed. The risks                            and benefits of the procedure and the sedation                            options and risks were discussed with the patient.                            All questions were answered, and informed consent                            was obtained. Prior Anticoagulants: The patient has                            taken no previous anticoagulant or antiplatelet                            agents. ASA Grade Assessment: III - A patient with  severe systemic disease. After reviewing the risks                            and benefits, the patient was deemed in                            satisfactory condition to undergo the procedure.                           After obtaining informed consent, the endoscope was                            passed under direct vision. Throughout the             procedure, the patient's blood pressure, pulse, and                            oxygen saturations were monitored continuously. The                            EG-[id number] ([id number]) scope was introduced through the                            mouth, and advanced to the second part of duodenum.                            The upper GI endoscopy was accomplished without                            difficulty. The patient tolerated the procedure                            well. Scope In: Scope Out: Findings:      LA Grade D erosive (one or more mucosal breaks involving at least 75% of       esophageal circumference) esophagitis with few areas of petechiae and       contact oozing was found 25 to 38 cm from the incisors. biopsy was not       performed      A small hiatal hernia was present.      Scattered mild inflammation characterized by congestion (edema) and       erosions was found in the gastric fundus. Biopsies were taken with a       cold forceps for histology.      Few non-bleeding linear and superficial duodenal ulcers with no stigmata       of bleeding were found in the duodenal bulb, in the first portion of the       duodenum and in the second portion of the duodenum. Impression:               - LA Grade D erosive esophagitis.                           - Small hiatal hernia.                           -  Gastritis. Biopsied.                           - Multiple non-bleeding duodenal ulcers with no                            stigmata of bleeding. Moderate Sedation:      Moderate (conscious) sedation was personally administered by an       anesthesia professional. The following parameters were monitored: oxygen       saturation, heart rate, blood pressure, and response to care. Recommendation:           - Return patient to hospital ward for ongoing care.                           - Full liquid diet.                           - Continue present medications.                            - Await pathology results.                           - Repeat upper endoscopy in 8 weeks to check                            healing. Procedure Code(s):        --- Professional ---                           [phone], Esophagogastroduodenoscopy, flexible,                            transoral; with biopsy, single or multiple Diagnosis Code(s):        --- Professional ---                           K20.8, Other esophagitis                           K44.9, Diaphragmatic hernia without obstruction or                            gangrene                           K29.70, Gastritis, unspecified, without bleeding                           K26.9, Duodenal ulcer, unspecified as acute or                            chronic, without hemorrhage or perforation                           R10.13, Epigastric pain  K92.0, Hematemesis                           K92.1, Melena (includes Hematochezia)                           R93.3, Abnormal findings on diagnostic imaging of                            other parts of digestive tract CPT copyright 2016 [employer]. All rights reserved. The codes documented in this report are preliminary and upon coder review may  be revised to meet current compliance requirements. Kathi DerParag Nimsi Males, MD Kathi DerParag [name], MD 12/04/2016 3:07:35 PM Number of Addenda: 0

## 2016-12-04 NOTE — H&P (Signed)
[facility] Admission Note  [name] [birth year]  [id number].    Requesting MD: Darl Householder, MD Chief Complaint/Reason for Consult: post-operative abdominal pain  HPI:  [name] is a 56 y/o male with PMH alcoholism (last drink 10 years ago), chronic pancreatitis, hyperlipidemia, COPD, tobacco abuse, and duodenitis who presented to [facility] with severe abdominal pain. He is POD#5 from laparoscopic cholecystectomy by Dr. Johney Maine for his chronic pancreatitis. Patient was discharged home on 11/29/16 in good condition. He reports 2 dark episodes of diarrhea on the evening of 6/8 followed by 2 black episodes of emesis; no further BMs since that time. Since this day he reports worsening, burning epigastric pain with radiation to his chest and across his upper abdomen. States the pain is constant but he has intermittent episodes where the pain becomes even more severe. He also endorses severe acid reflux that "burns his teeth". He has been unable to tolerate PO intake, stating that even ice chips cause severe epigastric pain and reflux. He is not having further vomiting but is spitting up brown mucous. He is a current daily smoker. Takes percocet at home for chronic pancreatitis. Denies use of blood thinners. Denies PMH MI or CVA. He reports a history of stomach ulcer but I am unable to find chart review/previous endoscopies to support this diagnosis.  Most recent upper endoscopy performed by Dr. Lia Foyer 12/15/2008 - Duodenitis was found. Signicant erythema and edema in post bulbar duodenum with lumenal narrowing (see image3 and image4). Able to pass scope through area without difficulty Esophagitis in the distal esophagus. Friable mucosa with erosive changes at GE junction (see image6 and image1). Bxs taken to r/o Barrett's esophagus (these were negative for dysplasia/malignancy),Otherwise the examination was normal.  ED workup: Lipase WNL, ALT 94, Alk Phos 149, WBC 16.1. hgb 9.6, hct 28.8, lactic  acid 2.36 >> 1.30 s/p IVF  CT ABD/PELV -  1. CT findings suggest distal esophagitis and duodenitis. 2. Expected postoperative changes from recent cholecystectomy. No findings for abscess, bile leak or hematoma.   ROS: Review of Systems  Constitutional: Negative for chills and fever.  Respiratory: Negative for shortness of breath.   Cardiovascular: Negative for chest pain and palpitations.  Gastrointestinal: Positive for abdominal pain, constipation and vomiting.  All other systems reviewed and are negative.        Family History  Problem Relation Age of Onset  . Diabetes Mother   . Heart disease Father   . Cancer Father        ?kidney or liver cancer        Past Medical History:  Diagnosis Date  . Chronic pancreatitis ([place])   . Chronic pancreatitis ([place])   . Depression   . Duodenitis    by EGD 1/07  . Emphysema    unclear diagnosis noted in previous EMR  . Heme positive stool    neg colonoscopies 11/2008 and 01/2008  . History of alcohol abuse   . Hyperlipidemia   . Psoriasis   . Psoriasis    extensive, responds to strong steroids, unable to start molecular therapies due to cost  . RUE weakness    2/2 cervical spondylosis  . Smoking          Past Surgical History:  Procedure Laterality Date  . LAPAROSCOPIC CHOLECYSTECTOMY SINGLE SITE WITH INTRAOPERATIVE CHOLANGIOGRAM N/A 11/28/2016   Procedure: LAPAROSCOPIC CHOLECYSTECTOMY SINGLE SITE WITH INTRAOPERATIVE CHOLANGIOGRAM;  Surgeon: Michael Boston, MD;  Location: [facility];  Service: General;  Laterality: N/A;  . LIVER  BIOPSY N/A 11/28/2016   Procedure: NEEDLE CORE LIVER BIOPSY;  Surgeon: Michael Boston, MD;  Location: [facility];  Service: General;  Laterality: N/A;  . TYMPANOPLASTY      Social History:  reports that he has been smoking Cigarettes.  He has a 15.00 pack-year smoking history. He has never used smokeless tobacco. He reports that he does not drink alcohol or use  drugs.  Allergies: No Known Allergies   (Not in a hospital admission)  Blood pressure 119/74, pulse 92, temperature 98.3 F (36.8 C), temperature source Oral, resp. rate 18, height '5\' 8"'  (1.727 m), weight 63.5 kg (140 lb), SpO2 99 %. Physical Exam: Physical Exam  Constitutional: He is oriented to person, place, and time. He appears well-developed and well-nourished. He appears distressed.  HENT:  Head: Normocephalic and atraumatic.  Right Ear: External ear normal.  Left Ear: External ear normal.  Eyes: Conjunctivae are normal. No scleral icterus.  Neck: Normal range of motion. Neck supple. No tracheal deviation present.  Cardiovascular: Normal rate, regular rhythm, normal heart sounds and intact distal pulses.   Pulmonary/Chest: Effort normal and breath sounds normal. No stridor. No respiratory distress. He has no wheezes. He exhibits no tenderness.  Abdominal: Soft. He exhibits distension. He exhibits no mass. There is tenderness. There is guarding. No hernia.  Diffuse abdominal pain, worse over epigastrium, with guarding. Hypoactive bowel sounds.  Musculoskeletal: Normal range of motion. He exhibits no edema or deformity.  Neurological: He is alert and oriented to person, place, and time. No sensory deficit.  Skin: Skin is warm. No rash noted. He is diaphoretic. No erythema.  Psychiatric: He has a normal mood and affect. His behavior is normal.    Lab Results Last 48 Hours        Results for orders placed or performed during the hospital encounter of 12/03/16 (from the past 48 hour(s))  Lipase, blood     Status: None   Collection Time: 12/03/16 12:10 PM  Result Value Ref Range   Lipase 15 11 - 51 U/L  Comprehensive metabolic panel     Status: Abnormal   Collection Time: 12/03/16 12:10 PM  Result Value Ref Range   Sodium 134 (L) 135 - 145 mmol/L   Potassium 3.7 3.5 - 5.1 mmol/L   Chloride 101 101 - 111 mmol/L   CO2 21 (L) 22 - 32 mmol/L   Glucose, Bld 127 (H)  65 - 99 mg/dL   BUN 26 (H) 6 - 20 mg/dL   Creatinine, Ser 0.68 0.61 - 1.24 mg/dL   Calcium 8.3 (L) 8.9 - 10.3 mg/dL   Total Protein 6.3 (L) 6.5 - 8.1 g/dL   Albumin 3.0 (L) 3.5 - 5.0 g/dL   AST 40 15 - 41 U/L   ALT 94 (H) 17 - 63 U/L   Alkaline Phosphatase 149 (H) 38 - 126 U/L   Total Bilirubin 0.7 0.3 - 1.2 mg/dL   GFR calc non Af Amer >60 >60 mL/min   GFR calc Af Amer >60 >60 mL/min    Comment: (NOTE) The eGFR has been calculated using the CKD EPI equation. This calculation has not been validated in all clinical situations. eGFR's persistently <60 mL/min signify possible Chronic Kidney Disease.    Anion gap 12 5 - 15  CBC     Status: Abnormal   Collection Time: 12/03/16 12:10 PM  Result Value Ref Range   WBC 16.1 (H) 4.0 - 10.5 K/uL   RBC 3.17 (L) 4.22 - 5.81 MIL/uL  Hemoglobin 9.6 (L) 13.0 - 17.0 g/dL   HCT 28.8 (L) 39.0 - 52.0 %   MCV 90.9 78.0 - 100.0 fL   MCH 30.3 26.0 - 34.0 pg   MCHC 33.3 30.0 - 36.0 g/dL   RDW 13.2 11.5 - 15.5 %   Platelets 209 150 - 400 K/uL  Urinalysis, Routine w reflex microscopic     Status: Abnormal   Collection Time: 12/03/16 12:16 PM  Result Value Ref Range   Color, Urine YELLOW YELLOW   APPearance CLEAR CLEAR   Specific Gravity, Urine 1.027 1.005 - 1.030   pH 5.0 5.0 - 8.0   Glucose, UA NEGATIVE NEGATIVE mg/dL   Hgb urine dipstick NEGATIVE NEGATIVE   Bilirubin Urine MODERATE (A) NEGATIVE   Ketones, ur 80 (A) NEGATIVE mg/dL   Protein, ur NEGATIVE NEGATIVE mg/dL   Nitrite NEGATIVE NEGATIVE   Leukocytes, UA NEGATIVE NEGATIVE  I-Stat CG4 Lactic Acid, ED     Status: Abnormal   Collection Time: 12/03/16 12:49 PM  Result Value Ref Range   Lactic Acid, Venous 2.36 (HH) 0.5 - 1.9 mmol/L   Comment NOTIFIED PHYSICIAN   Ethanol     Status: None   Collection Time: 12/03/16  1:25 PM  Result Value Ref Range   Alcohol, Ethyl (B) <5 <5 mg/dL    Comment:        LOWEST DETECTABLE LIMIT FOR SERUM  ALCOHOL IS 5 mg/dL FOR MEDICAL PURPOSES ONLY   I-Stat CG4 Lactic Acid, ED     Status: None   Collection Time: 12/03/16  3:47 PM  Result Value Ref Range   Lactic Acid, Venous 1.30 0.5 - 1.9 mmol/L      Imaging Results (Last 48 hours)  Dg Chest 2 View  Result Date: 12/03/2016 CLINICAL DATA:  56 year old who underwent cholecystectomy on 11/28/2016, presenting with acute onset of fever, chills, epigastric abdominal pain, nausea and vomiting, and shortness of breath. Current smoker with current history of COPD. EXAM: CHEST  2 VIEW COMPARISON:  08/31/2011, 12/28/2008 and earlier. FINDINGS: Cardiac silhouette normal in size, unchanged. Thoracic aorta minimally atherosclerotic. Hilar and mediastinal contours otherwise unremarkable. Emphysematous changes throughout both lungs, prominent bronchovascular markings diffusely and moderate central peribronchial thickening, unchanged. Parenchymal scarring involving the right lower lobe, unchanged. Lungs otherwise clear. No confluent airspace consolidation. No pleural effusions. Visualized bony thorax intact. IMPRESSION: COPD/emphysema. Scarring involving the right lower lobe. No acute cardiopulmonary disease. Electronically Signed   By: Evangeline Dakin M.D.   On: 12/03/2016 15:05   Ct Abdomen Pelvis W Contrast  Result Date: 12/03/2016 CLINICAL DATA:  History of recent cholecystectomy last week with upper abdominal pain, nausea and vomiting after eating or drinking for the last 3 days. EXAM: CT ABDOMEN AND PELVIS WITH CONTRAST TECHNIQUE: Multidetector CT imaging of the abdomen and pelvis was performed using the standard protocol following bolus administration of intravenous contrast. CONTRAST:  143m ISOVUE-300 IOPAMIDOL (ISOVUE-300) INJECTION 61% COMPARISON:  CT scan 09/02/2016 FINDINGS: Lower chest: Stable large right middle lobe colo, emphysematous changes and scarring. The distal esophagus demonstrates moderate wall thickening and edema which could be  acute esophagitis. Recommend clinical correlation with any symptoms of dysphasia. Hepatobiliary: Focal area of low attenuation involving the right hepatic lobe near the gallbladder fossa of which could be some residual local inflammation or possibly the intraoperative contusion. No laceration or hemorrhage. There is hazy interstitial changes in the gallbladder fossa fat which is not unexpected this in after surgery. No focal fluid collection suspicious for a bile leak.  No abscess or hematoma. Normal caliber common bile duct. Pancreas: No mass, inflammation or ductal dilatation. Stable cluster of calcifications in the pancreatic head likely the sequela of prior focal pancreatitis. Spleen: Normal size.  No focal lesions. Adrenals/Urinary Tract: The adrenal glands and kidneys are unremarkable and stable. Small right renal calculus but no ureteral or bladder calculi and no renal or bladder mass. Stomach/Bowel: The stomach is unremarkable. There is moderate wall thickening involving the second and third portions of the duodenum with submucosal edema and mucosal enhancement. Findings likely represent duodenitis. The remaining small bowel and colon are unremarkable. Contrast and stool noted in the colon. The terminal ileum is normal. The appendix is normal. Vascular/Lymphatic: Advanced atherosclerotic calcifications involving the aorta but no aneurysm or dissection. The major venous structures are patent. Reproductive: The prostate gland and seminal vesicles are unremarkable. Other: No pelvic mass or adenopathy. No free pelvic fluid collections. No inguinal mass or adenopathy. No abdominal wall hernia or subcutaneous lesions. Musculoskeletal: No significant bony findings. 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 IMPRESSION: 1. CT findings suggest distal esophagitis and duodenitis. 2. Expected postoperative changes from recent cholecystectomy. No findings  for abscess, bile leak or hematoma. Electronically Signed   By: Marijo Sanes M.D.   On: 12/03/2016 15:07    Assessment/Plan 1. Epigastric abdominal pain 2. Duodenitis/esophagitis             - S/P laparoscopic cholecystectomy 11/28/16; port sites c/d/i, no evidence of leak on CT              - CT scan suggests duodenitis/esophagitis which is consistent with patient history; no free air. GI consult for possible upper endoscopy to r/o gastric/duodenal ulcer or UGI bleed.              - IV pain medication PRN, PRN antiemetics              - NPO  3. Leukocytosis - suspect inflammatory, no signs of post-operative infection. No free air to suggest perforated viscus. Will repeat CBC in AM and hold off on empiric abx. Appreciate GI assistance. If GI feels abx are appropriate then please order. 4. Normocytic anemia - hgb 9.6, CBC in AM 5. Chronic pancreatitis - continue pain control, Lipase normal 6. Tobacco abuse/COPD - PRN duonebs   7. Hyperlipidemia - hold home lipitor while NPO  FEN: NPO, IVF ID: none VTE: [place], [facility] 12/03/2016, 4:03 PM Pager: [phone] Consults: [phone] Mon-Fri 7:00 am-4:30 pm Sat-Sun 7:00 am-11:30 am

## 2016-12-04 NOTE — Progress Notes (Signed)
[facility] Progress Note     Subjective: CC: abdominal pain Pt states pain is unchanged compared to yesterday. Rated 10/10, constant, burning epigastric pain. Pain relieved for <1 hr by IV dilaudid. Denies vomiting or BM since admission. Denies flatus. Urinating without hesitancy. Denies dizziness, light-headedness, or palpitations.  Objective: Vital signs in last 24 hours: Temp:  [98 F (36.7 C)-98.5 F (36.9 C)] 98 F (36.7 C) (06/13 0610) Pulse Rate:  [80-104] 80 (06/13 0610) Resp:  [15-20] 18 (06/13 0610) BP: (90-132)/(58-92) 100/61 (06/13 0610) SpO2:  [92 %-100 %] 95 % (06/13 0610) Weight:  [60.1 kg (132 lb 7.9 oz)-63.5 kg (140 lb)] 60.1 kg (132 lb 7.9 oz) (06/12 1802) Last BM Date: 11/29/16   Intake/Output from previous day: 06/12 0701 - 06/13 0700 In: 3489.6 [I.V.:1489.6; IV Piggyback:2000] Out: 1050 [Urine:1050] Intake/Output this shift: No intake/output data recorded.  PE: Gen:  Alert, NAD, cooperative, uncomfortable. Card:  Regular rate and rhythm, pedal pulses 2+ BL Pulm:  Normal effort, clear to auscultation bilaterally Abd: firm, diffusely TTP with guarding, worse over epigastrium, mild distention, hypoactive BS Skin: warm and dry, no rashes  Psych: A&Ox3   Lab Results:   Recent Labs  12/03/16 1210 12/04/16 0349  WBC 16.1* 12.0*  HGB 9.6* 7.4*  HCT 28.8* 22.8*  PLT 209 180   BMET  Recent Labs  12/03/16 1210 12/04/16 0349  NA 134* 136  K 3.7 4.0  CL 101 107  CO2 21* 22  GLUCOSE 127* 150*  BUN 26* 15  CREATININE 0.68 0.57*  CALCIUM 8.3* 7.5*   PT/INR No results for input(s): LABPROT, INR in the last 72 hours. CMP     Component Value Date/Time   NA 136 12/04/2016 0349   K 4.0 12/04/2016 0349   CL 107 12/04/2016 0349   CO2 22 12/04/2016 0349   GLUCOSE 150 (H) 12/04/2016 0349   BUN 15 12/04/2016 0349   CREATININE 0.57 (L) 12/04/2016 0349   CALCIUM 7.5 (L) 12/04/2016 0349   PROT 4.8 (L) 12/04/2016 0349   ALBUMIN 2.3 (L)  12/04/2016 0349   AST 28 12/04/2016 0349   ALT 59 12/04/2016 0349   ALKPHOS 105 12/04/2016 0349   BILITOT 0.2 (L) 12/04/2016 0349   GFRNONAA >60 12/04/2016 0349   GFRAA >60 12/04/2016 0349   Lipase     Component Value Date/Time   LIPASE 15 12/03/2016 1210       Studies/Results: Dg Chest 2 View  Result Date: 12/03/2016 CLINICAL DATA:  56 year old who underwent cholecystectomy on 11/28/2016, presenting with acute onset of fever, chills, epigastric abdominal pain, nausea and vomiting, and shortness of breath. Current smoker with current history of COPD. EXAM: CHEST  2 VIEW COMPARISON:  08/31/2011, 12/28/2008 and earlier. FINDINGS: Cardiac silhouette normal in size, unchanged. Thoracic aorta minimally atherosclerotic. Hilar and mediastinal contours otherwise unremarkable. Emphysematous changes throughout both lungs, prominent bronchovascular markings diffusely and moderate central peribronchial thickening, unchanged. Parenchymal scarring involving the right lower lobe, unchanged. Lungs otherwise clear. No confluent airspace consolidation. No pleural effusions. Visualized bony thorax intact. IMPRESSION: COPD/emphysema. Scarring involving the right lower lobe. No acute cardiopulmonary disease. Electronically Signed   By: Hulan Saas M.D.   On: 12/03/2016 15:05   Ct Abdomen Pelvis W Contrast  Result Date: 12/03/2016 CLINICAL DATA:  History of recent cholecystectomy last week with upper abdominal pain, nausea and vomiting after eating or drinking for the last 3 days. EXAM: CT ABDOMEN AND PELVIS WITH CONTRAST TECHNIQUE: Multidetector CT imaging of the abdomen  and pelvis was performed using the standard protocol following bolus administration of intravenous contrast. CONTRAST:  100mL ISOVUE-300 IOPAMIDOL (ISOVUE-300) INJECTION 61% COMPARISON:  CT scan 09/02/2016 FINDINGS: Lower chest: Stable large right middle lobe colo, emphysematous changes and scarring. The distal esophagus demonstrates  moderate wall thickening and edema which could be acute esophagitis. Recommend clinical correlation with any symptoms of dysphasia. Hepatobiliary: Focal area of low attenuation involving the right hepatic lobe near the gallbladder fossa of which could be some residual local inflammation or possibly the intraoperative contusion. No laceration or hemorrhage. There is hazy interstitial changes in the gallbladder fossa fat which is not unexpected this in after surgery. No focal fluid collection suspicious for a bile leak. No abscess or hematoma. Normal caliber common bile duct. Pancreas: No mass, inflammation or ductal dilatation. Stable cluster of calcifications in the pancreatic head likely the sequela of prior focal pancreatitis. Spleen: Normal size.  No focal lesions. Adrenals/Urinary Tract: The adrenal glands and kidneys are unremarkable and stable. Small right renal calculus but no ureteral or bladder calculi and no renal or bladder mass. Stomach/Bowel: The stomach is unremarkable. There is moderate wall thickening involving the second and third portions of the duodenum with submucosal edema and mucosal enhancement. Findings likely represent duodenitis. The remaining small bowel and colon are unremarkable. Contrast and stool noted in the colon. The terminal ileum is normal. The appendix is normal. Vascular/Lymphatic: Advanced atherosclerotic calcifications involving the aorta but no aneurysm or dissection. The major venous structures are patent. Reproductive: The prostate gland and seminal vesicles are unremarkable. Other: No pelvic mass or adenopathy. No free pelvic fluid collections. No inguinal mass or adenopathy. No abdominal wall hernia or subcutaneous lesions. Musculoskeletal: No significant bony findings. 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 IMPRESSION: 1. CT findings suggest distal esophagitis and duodenitis. 2. Expected  postoperative changes from recent cholecystectomy. No findings for abscess, bile leak or hematoma. Electronically Signed   By: Rudie MeyerP.  Gallerani M.D.   On: 12/03/2016 15:07    Anti-infectives: Anti-infectives    None     Assessment/Plan 1. Epigastric abdominal pain 2. Duodenitis/esophagitis - S/P laparoscopic cholecystectomy 11/28/16; port sites c/d/i, no evidence of leak on CT  - CT scan suggests duodenitis/esophagitis which is consistent with patient history; no free air. GI consult for possible upper endoscopy to r/o gastric/duodenal ulcer or UGI bleed.Spoke with Dr. Levora AngelBrahmbhatt who will evaluate the patient today. Appreciate GI assistance and recommendations. - IV pain medication PRN, PRN antiemetics  - NPO  3.Leukocytosis - improving; suspect inflammatory, no signs of post-operative infection. No free air to suggest perforated viscus. Hold off on empiric abx. If GI feels abx are appropriate then please order. CBC in AM. 4.Normocytic anemia- hgb 7.4 from 9.6, will hold off on transfusion for now; re-check CBC this afternoon and AM. 5. Chronic pancreatitis- continue pain control, Lipase normal 6. Tobacco abuse/COPD - PRN duonebs  7. Hyperlipidemia - hold home lipitor while NPO  FEN: NPO, IVF ID: none VTE: SCD's    LOS: 0 days    Adam PhenixElizabeth S Simaan , [facility] 12/04/2016, 7:27 AM Pager: [phone] Consults: [phone] Mon-Fri 7:00 am-4:30 pm Sat-Sun 7:00 am-11:30 am

## 2016-12-04 NOTE — Brief Op Note (Signed)
12/03/2016 - 12/04/2016  3:08 PM  PATIENT:  Samuel SaaKenneth T Wilmer  56 y.o. male  PRE-OPERATIVE DIAGNOSIS:  GI bleed  POST-OPERATIVE DIAGNOSIS:  severe ulcerative esophagitis, duodenal ulcers, no active bleeding  PROCEDURE:  Procedure(s): ESOPHAGOGASTRODUODENOSCOPY (EGD) WITH PROPOFOL (N/A)  SURGEON:  Surgeon(s) and Role:    * Teodor Prater, MD - Primary  Findings/recommendations ----------------------------------- - EGD showed LA grade D erosive esophagitis with contact oozing as well as multiple clean-based duodenal ulcers - Check serum gastrin level. -  IV twice a day PPI drip for now, continue Magic mouth AND Carafate - Full liquid diet. Monitor hemoglobin. - Repeat EGD in 8 weeks. - GI will follow    Kathi DerParag Mekaila Tarnow MD, FACP 12/04/2016, 3:10 PM  Pager [phone]  If no answer or after 5 PM call [phone]

## 2016-12-04 NOTE — Consult Note (Signed)
Referring Provider: Hosie Spangle, PA-C Primary Care Physician:  Jackie Plum, MD Primary Gastroenterologist:  Dr. Levora Angel  Reason for Consultation:  Esophagitis, upper GI bleed, epigastric pain  HPI: [name] is a 56 y.o. male with past medical history of chronic pancreatitis, and COPD presented to the hospital with severe abdominal pain. Patient underwent elective cholecystectomy by Dr. gross last Thursday. Patient started noticing worsening epigastric pain after discharge. Patient subsequent a started having severe burning in the substernal area followed by trouble swallowing as well as pain with swallowing. Patient then noted coffee-ground emesis as well as diarrhea which was darker in color. Patient found to have significant drop in hemoglobin from normal hemoglobin of 15 on 11/26/2016 to 7.4 this morning. Normal BUN. Normal LFTs.  Past Medical History:  Diagnosis Date  . Chronic pancreatitis (HCC)   . Depression   . Duodenitis    by EGD 1/07  . Emphysema    unclear diagnosis noted in previous EMR  . Heme positive stool    neg colonoscopies 11/2008 and 01/2008  . History of alcohol abuse   . Hyperlipidemia   . Psoriasis    extensive, responds to strong steroids, unable to start molecular therapies due to cost  . RUE weakness    2/2 cervical spondylosis  . Smoking   . Stomach ulcer     Past Surgical History:  Procedure Laterality Date  . LAPAROSCOPIC CHOLECYSTECTOMY  11/28/2016   w/IOC  . LAPAROSCOPIC CHOLECYSTECTOMY SINGLE SITE WITH INTRAOPERATIVE CHOLANGIOGRAM N/A 11/28/2016   Procedure: LAPAROSCOPIC CHOLECYSTECTOMY SINGLE SITE WITH INTRAOPERATIVE CHOLANGIOGRAM;  Surgeon: Karie Soda, MD;  Location: [facility];  Service: General;  Laterality: N/A;  . LIVER BIOPSY N/A 11/28/2016   Procedure: NEEDLE CORE LIVER BIOPSY;  Surgeon: Karie Soda, MD;  Location: [facility];  Service: General;  Laterality: N/A;  . TYMPANOSTOMY TUBE PLACEMENT Bilateral ~ 1970    Prior to  Admission medications   Medication Sig Start Date End Date Taking? Authorizing Provider  atorvastatin (LIPITOR) 40 MG tablet Take 1 tablet (40 mg total) by mouth daily. Patient taking differently: Take 40 mg by mouth at bedtime.  10/15/16 01/13/17 Yes Pricilla Riffle, MD  gabapentin (NEURONTIN) 300 MG capsule Take 1 capsule (300 mg total) by mouth 2 (two) times daily. Increase to 4x/day as needed 11/28/16  Yes Gross, Viviann Spare, MD  methocarbamol (ROBAXIN) 750 MG tablet Take 1 tablet (750 mg total) by mouth 4 (four) times daily as needed (use for muscle cramps/pain). 11/28/16  Yes Karie Soda, MD  ondansetron (ZOFRAN) 4 MG tablet Take 1 tablet (4 mg total) by mouth every 8 (eight) hours as needed for nausea. 11/28/16  Yes Karie Soda, MD  oxyCODONE-acetaminophen (PERCOCET) 7.5-325 MG tablet Take 1 tablet by mouth every 4 (four) hours as needed for moderate pain or severe pain (FOR PAIN.). 11/28/16  Yes Karie Soda, MD    Scheduled Meds: . pantoprazole (PROTONIX) IV  40 mg Intravenous QHS   Continuous Infusions: . dextrose 5 % and 0.45 % NaCl with KCl 20 mEq/L 125 mL/hr at 12/04/16 0215   PRN Meds:.acetaminophen **OR** acetaminophen, diphenhydrAMINE **OR** diphenhydrAMINE, hydrALAZINE, HYDROmorphone (DILAUDID) injection, ipratropium-albuterol, ondansetron **OR** ondansetron (ZOFRAN) IV, sodium chloride flush  Allergies as of 12/03/2016  . (No Known Allergies)    Family History  Problem Relation Age of Onset  . Diabetes Mother   . Heart disease Father   . Cancer Father        ?kidney or liver cancer    Social History  Social History  . Marital status: Divorced    Spouse name: N/A  . Number of children: N/A  . Years of education: N/A   Occupational History  . Not on file.   Social History Main Topics  . Smoking status: Current Every Day Smoker    Packs/day: 1.00    Years: 40.00    Types: Cigarettes  . Smokeless tobacco: Never Used  . Alcohol use Yes     Comment: 12/03/2016 "nothing  since 2008"  . Drug use: No     Comment: quit in high school  . Sexual activity: Not Currently     Comment: trying for disability to gain medicare coverage, has pain contract and warned that if tested positive again for cocaine will not be given narcotics   Other Topics Concern  . Not on file   Social History Narrative   Financial assistance approved for 100% discount at [facility] and has [facility] card   [facility]  March 08, 2010 2:17 PM    Review of Systems: Review of Systems  Constitutional: Positive for chills and fever.  HENT: Negative for ear discharge, ear pain, hearing loss and tinnitus.   Eyes: Negative for blurred vision and double vision.  Respiratory: Negative for cough, hemoptysis and sputum production.   Cardiovascular: Negative for chest pain and palpitations.  Gastrointestinal: Positive for abdominal pain, blood in stool, heartburn, melena, nausea and vomiting.  Genitourinary: Negative for dysuria and urgency.  Musculoskeletal: Positive for back pain. Negative for myalgias.  Skin: Negative for rash.  Neurological: Positive for weakness. Negative for focal weakness and seizures.  Endo/Heme/Allergies: Does not bruise/bleed easily.  Psychiatric/Behavioral: Negative for hallucinations and suicidal ideas.    Physical Exam: Vital signs: Vitals:   12/04/16 0220 12/04/16 0610  BP: (!) 99/58 100/61  Pulse: 89 80  Resp: 18 18  Temp: 98.3 F (36.8 C) 98 F (36.7 C)   Last BM Date: 11/29/16 Physical Exam  Constitutional: He is oriented to person, place, and time. He appears well-developed and well-nourished. He appears distressed.  HENT:  Head: Normocephalic and atraumatic.  Mouth/Throat: Oropharynx is clear and moist.  Eyes: EOM are normal. No scleral icterus.  Neck: Normal range of motion. Neck supple. No thyromegaly present.  Cardiovascular: Normal rate, regular rhythm and normal heart sounds.   Pulmonary/Chest: Effort normal and breath sounds normal. No  respiratory distress.  Abdominal: Soft. Bowel sounds are normal. He exhibits no distension. There is tenderness. There is no rebound and no guarding.  Musculoskeletal: Normal range of motion. He exhibits no edema.  Neurological: He is alert and oriented to person, place, and time.  Skin: Skin is warm. No erythema.  Psychiatric: He has a normal mood and affect. His behavior is normal. Thought content normal.    GI:  Lab Results:  Recent Labs  12/03/16 1210 12/04/16 0349  WBC 16.1* 12.0*  HGB 9.6* 7.4*  HCT 28.8* 22.8*  PLT 209 180   BMET  Recent Labs  12/03/16 1210 12/04/16 0349  NA 134* 136  K 3.7 4.0  CL 101 107  CO2 21* 22  GLUCOSE 127* 150*  BUN 26* 15  CREATININE 0.68 0.57*  CALCIUM 8.3* 7.5*   LFT  Recent Labs  12/04/16 0349  PROT 4.8*  ALBUMIN 2.3*  AST 28  ALT 59  ALKPHOS 105  BILITOT 0.2*   PT/INR No results for input(s): LABPROT, INR in the last 72 hours.   Studies/Results: Dg Chest 2 View  Result Date: 12/03/2016 CLINICAL  DATA:  56 year old who underwent cholecystectomy on 11/28/2016, presenting with acute onset of fever, chills, epigastric abdominal pain, nausea and vomiting, and shortness of breath. Current smoker with current history of COPD. EXAM: CHEST  2 VIEW COMPARISON:  08/31/2011, 12/28/2008 and earlier. FINDINGS: Cardiac silhouette normal in size, unchanged. Thoracic aorta minimally atherosclerotic. Hilar and mediastinal contours otherwise unremarkable. Emphysematous changes throughout both lungs, prominent bronchovascular markings diffusely and moderate central peribronchial thickening, unchanged. Parenchymal scarring involving the right lower lobe, unchanged. Lungs otherwise clear. No confluent airspace consolidation. No pleural effusions. Visualized bony thorax intact. IMPRESSION: COPD/emphysema. Scarring involving the right lower lobe. No acute cardiopulmonary disease. Electronically Signed   By: Hulan Saashomas  Lawrence M.D.   On: 12/03/2016  15:05   Ct Abdomen Pelvis W Contrast  Result Date: 12/03/2016 CLINICAL DATA:  History of recent cholecystectomy last week with upper abdominal pain, nausea and vomiting after eating or drinking for the last 3 days. EXAM: CT ABDOMEN AND PELVIS WITH CONTRAST TECHNIQUE: Multidetector CT imaging of the abdomen and pelvis was performed using the standard protocol following bolus administration of intravenous contrast. CONTRAST:  100mL ISOVUE-300 IOPAMIDOL (ISOVUE-300) INJECTION 61% COMPARISON:  CT scan 09/02/2016 FINDINGS: Lower chest: Stable large right middle lobe colo, emphysematous changes and scarring. The distal esophagus demonstrates moderate wall thickening and edema which could be acute esophagitis. Recommend clinical correlation with any symptoms of dysphasia. Hepatobiliary: Focal area of low attenuation involving the right hepatic lobe near the gallbladder fossa of which could be some residual local inflammation or possibly the intraoperative contusion. No laceration or hemorrhage. There is hazy interstitial changes in the gallbladder fossa fat which is not unexpected this in after surgery. No focal fluid collection suspicious for a bile leak. No abscess or hematoma. Normal caliber common bile duct. Pancreas: No mass, inflammation or ductal dilatation. Stable cluster of calcifications in the pancreatic head likely the sequela of prior focal pancreatitis. Spleen: Normal size.  No focal lesions. Adrenals/Urinary Tract: The adrenal glands and kidneys are unremarkable and stable. Small right renal calculus but no ureteral or bladder calculi and no renal or bladder mass. Stomach/Bowel: The stomach is unremarkable. There is moderate wall thickening involving the second and third portions of the duodenum with submucosal edema and mucosal enhancement. Findings likely represent duodenitis. The remaining small bowel and colon are unremarkable. Contrast and stool noted in the colon. The terminal ileum is normal. The  appendix is normal. Vascular/Lymphatic: Advanced atherosclerotic calcifications involving the aorta but no aneurysm or dissection. The major venous structures are patent. Reproductive: The prostate gland and seminal vesicles are unremarkable. Other: No pelvic mass or adenopathy. No free pelvic fluid collections. No inguinal mass or adenopathy. No abdominal wall hernia or subcutaneous lesions. Musculoskeletal: No significant bony findings. 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 IMPRESSION: 1. CT findings suggest distal esophagitis and duodenitis. 2. Expected postoperative changes from recent cholecystectomy. No findings for abscess, bile leak or hematoma. Electronically Signed   By: Rudie MeyerP.  Gallerani M.D.   On: 12/03/2016 15:07    Impression/Plan: - Coffee-ground emesis and melena with acute drop in hemoglobin. CT scan showing distal esophagitis as well as duodenitis. - Acute drop in hemoglobin from 15 to 7.4 - Status post cholecystectomy 11/26/2016 - Chronic pancreatitis  Recommendations ------------------------- - EGD today for further evaluation. Risk benefits and alternatives to the patient. Verbalized understanding. - Patient's epigastric pain as substernal burning could be from severe esophagitis. I will start Magic mouth  weighs and liquid Carafate. Continue IV twice a day PPI for now. - Monitor H&H. Transfuse to keep hemoglobin more than 8 - GI will follow. Further plan based on endoscopic finding.    LOS: 0 days   Kathi Der  MD, FACP 12/04/2016, 8:46 AM  Pager [phone] If no answer or after 5 PM call [phone]

## 2016-12-04 NOTE — Transfer of Care (Signed)
Immediate Anesthesia Transfer of Care Note  Patient: [name]  Procedure(s) Performed: Procedure(s): ESOPHAGOGASTRODUODENOSCOPY (EGD) WITH PROPOFOL (N/A)  Patient Location: PACU  Anesthesia Type:MAC  Level of Consciousness: patient cooperative and responds to stimulation  Airway & Oxygen Therapy: Patient Spontanous Breathing and Patient connected to nasal cannula oxygen  Post-op Assessment: Report given to RN and Post -op Vital signs reviewed and stable  Post vital signs: Reviewed and stable  Last Vitals:  Vitals:   12/04/16 1409 12/04/16 1459  BP: 98/68 (!) 82/49  Pulse: 80 82  Resp: 17 17  Temp: 36.6 C     Last Pain:  Vitals:   12/04/16 1459  TempSrc: Oral  PainSc:          Complications: No apparent anesthesia complications

## 2016-12-04 NOTE — Progress Notes (Signed)
Dr. Jean RosenthalJackson at bedside to evaluate BP.  VO to give 500cc bolus LR. If BP stabilizes send to floor.   If drops under SBP 60 contact GI.

## 2016-12-05 DIAGNOSIS — F17210 Nicotine dependence, cigarettes, uncomplicated: Secondary | ICD-10-CM | POA: Diagnosis present

## 2016-12-05 DIAGNOSIS — K269 Duodenal ulcer, unspecified as acute or chronic, without hemorrhage or perforation: Secondary | ICD-10-CM | POA: Diagnosis present

## 2016-12-05 DIAGNOSIS — R710 Precipitous drop in hematocrit: Secondary | ICD-10-CM | POA: Diagnosis not present

## 2016-12-05 DIAGNOSIS — L409 Psoriasis, unspecified: Secondary | ICD-10-CM | POA: Diagnosis present

## 2016-12-05 DIAGNOSIS — K920 Hematemesis: Secondary | ICD-10-CM | POA: Diagnosis present

## 2016-12-05 DIAGNOSIS — K2210 Ulcer of esophagus without bleeding: Secondary | ICD-10-CM | POA: Diagnosis present

## 2016-12-05 DIAGNOSIS — Z79899 Other long term (current) drug therapy: Secondary | ICD-10-CM | POA: Diagnosis not present

## 2016-12-05 DIAGNOSIS — K861 Other chronic pancreatitis: Secondary | ICD-10-CM | POA: Diagnosis present

## 2016-12-05 DIAGNOSIS — J439 Emphysema, unspecified: Secondary | ICD-10-CM | POA: Diagnosis present

## 2016-12-05 DIAGNOSIS — K449 Diaphragmatic hernia without obstruction or gangrene: Secondary | ICD-10-CM | POA: Diagnosis present

## 2016-12-05 DIAGNOSIS — F329 Major depressive disorder, single episode, unspecified: Secondary | ICD-10-CM | POA: Diagnosis present

## 2016-12-05 DIAGNOSIS — K2980 Duodenitis without bleeding: Secondary | ICD-10-CM | POA: Diagnosis present

## 2016-12-05 DIAGNOSIS — K921 Melena: Secondary | ICD-10-CM | POA: Diagnosis present

## 2016-12-05 DIAGNOSIS — E785 Hyperlipidemia, unspecified: Secondary | ICD-10-CM | POA: Diagnosis present

## 2016-12-05 LAB — CBC
HEMATOCRIT: 22.5 % — AB (ref 39.0–52.0)
Hemoglobin: 7.3 g/dL — ABNORMAL LOW (ref 13.0–17.0)
MCH: 30.2 pg (ref 26.0–34.0)
MCHC: 32.4 g/dL (ref 30.0–36.0)
MCV: 93 fL (ref 78.0–100.0)
Platelets: 212 10*3/uL (ref 150–400)
RBC: 2.42 MIL/uL — ABNORMAL LOW (ref 4.22–5.81)
RDW: 13.7 % (ref 11.5–15.5)
WBC: 6.3 10*3/uL (ref 4.0–10.5)

## 2016-12-05 MED ORDER — OXYCODONE-ACETAMINOPHEN 7.5-325 MG PO TABS
1.0000 | ORAL_TABLET | ORAL | Status: DC | PRN
  Administered 2016-12-05 – 2016-12-06 (×5): 1 via ORAL
  Filled 2016-12-05 (×5): qty 1

## 2016-12-05 MED ORDER — ATORVASTATIN CALCIUM 40 MG PO TABS
40.0000 mg | ORAL_TABLET | Freq: Every day | ORAL | Status: DC
  Administered 2016-12-05: 40 mg via ORAL
  Filled 2016-12-05: qty 1

## 2016-12-05 MED ORDER — HYDROMORPHONE HCL 1 MG/ML IJ SOLN
1.0000 mg | INTRAMUSCULAR | Status: DC | PRN
  Administered 2016-12-05: 1 mg via INTRAVENOUS
  Filled 2016-12-05: qty 1

## 2016-12-05 MED ORDER — POLYETHYLENE GLYCOL 3350 17 G PO PACK
17.0000 g | PACK | Freq: Every day | ORAL | Status: DC
  Administered 2016-12-05 – 2016-12-06 (×2): 17 g via ORAL
  Filled 2016-12-05 (×2): qty 1

## 2016-12-05 NOTE — Progress Notes (Signed)
[facility] Progress Note  1 Day Post-Op  Subjective: CC: abdominal pain Epigastric abdominal pain still present but improving. Denies nausea or vomiting. Having flatus. Denies BM.   Objective: Vital signs in last 24 hours: Temp:  [97.7 F (36.5 C)-98.6 F (37 C)] 98.3 F (36.8 C) (06/14 0514) Pulse Rate:  [71-104] 77 (06/14 0514) Resp:  [13-24] 17 (06/14 0514) BP: (75-119)/(36-74) 103/60 (06/14 0514) SpO2:  [93 %-99 %] 96 % (06/14 0514) Weight:  [59.9 kg (132 lb)] 59.9 kg (132 lb) (06/13 1409) Last BM Date: 11/29/16  Intake/Output from previous day: 06/13 0701 - 06/14 0700 In: 3522.9 [P.O.:300; I.V.:3222.9] Out: 1375 [Urine:1375] Intake/Output this shift: No intake/output data recorded.  PE: Gen:  Alert, NAD, cooperative Card:  Regular rate and rhythm, pedal pulses 2+ BL Pulm:  Normal effort, clear to auscultation bilaterally Abd: firm, TTP epigastrium without peritonitis, mild distention, +BS Skin: warm and dry, no rashes  Psych: A&Ox3   Lab Results:   Recent Labs  12/04/16 1232 12/05/16 0330  WBC 8.9 6.3  HGB 7.5* 7.3*  HCT 23.2* 22.5*  PLT 192 212   BMET  Recent Labs  12/03/16 1210 12/04/16 0349  NA 134* 136  K 3.7 4.0  CL 101 107  CO2 21* 22  GLUCOSE 127* 150*  BUN 26* 15  CREATININE 0.68 0.57*  CALCIUM 8.3* 7.5*   PT/INR No results for input(s): LABPROT, INR in the last 72 hours. CMP     Component Value Date/Time   NA 136 12/04/2016 0349   K 4.0 12/04/2016 0349   CL 107 12/04/2016 0349   CO2 22 12/04/2016 0349   GLUCOSE 150 (H) 12/04/2016 0349   BUN 15 12/04/2016 0349   CREATININE 0.57 (L) 12/04/2016 0349   CALCIUM 7.5 (L) 12/04/2016 0349   PROT 4.8 (L) 12/04/2016 0349   ALBUMIN 2.3 (L) 12/04/2016 0349   AST 28 12/04/2016 0349   ALT 59 12/04/2016 0349   ALKPHOS 105 12/04/2016 0349   BILITOT 0.2 (L) 12/04/2016 0349   GFRNONAA >60 12/04/2016 0349   GFRAA >60 12/04/2016 0349   Lipase     Component Value Date/Time   LIPASE 15 12/03/2016 1210       Studies/Results: Dg Chest 2 View  Result Date: 12/03/2016 CLINICAL DATA:  56 year old who underwent cholecystectomy on 11/28/2016, presenting with acute onset of fever, chills, epigastric abdominal pain, nausea and vomiting, and shortness of breath. Current smoker with current history of COPD. EXAM: CHEST  2 VIEW COMPARISON:  08/31/2011, 12/28/2008 and earlier. FINDINGS: Cardiac silhouette normal in size, unchanged. Thoracic aorta minimally atherosclerotic. Hilar and mediastinal contours otherwise unremarkable. Emphysematous changes throughout both lungs, prominent bronchovascular markings diffusely and moderate central peribronchial thickening, unchanged. Parenchymal scarring involving the right lower lobe, unchanged. Lungs otherwise clear. No confluent airspace consolidation. No pleural effusions. Visualized bony thorax intact. IMPRESSION: COPD/emphysema. Scarring involving the right lower lobe. No acute cardiopulmonary disease. Electronically Signed   By: Hulan Saashomas  Lawrence M.D.   On: 12/03/2016 15:05   Ct Abdomen Pelvis W Contrast  Result Date: 12/03/2016 CLINICAL DATA:  History of recent cholecystectomy last week with upper abdominal pain, nausea and vomiting after eating or drinking for the last 3 days. EXAM: CT ABDOMEN AND PELVIS WITH CONTRAST TECHNIQUE: Multidetector CT imaging of the abdomen and pelvis was performed using the standard protocol following bolus administration of intravenous contrast. CONTRAST:  100mL ISOVUE-300 IOPAMIDOL (ISOVUE-300) INJECTION 61% COMPARISON:  CT scan 09/02/2016 FINDINGS: Lower chest: Stable large right middle lobe colo,  emphysematous changes and scarring. The distal esophagus demonstrates moderate wall thickening and edema which could be acute esophagitis. Recommend clinical correlation with any symptoms of dysphasia. Hepatobiliary: Focal area of low attenuation involving the right hepatic lobe near the gallbladder fossa of which  could be some residual local inflammation or possibly the intraoperative contusion. No laceration or hemorrhage. There is hazy interstitial changes in the gallbladder fossa fat which is not unexpected this in after surgery. No focal fluid collection suspicious for a bile leak. No abscess or hematoma. Normal caliber common bile duct. Pancreas: No mass, inflammation or ductal dilatation. Stable cluster of calcifications in the pancreatic head likely the sequela of prior focal pancreatitis. Spleen: Normal size.  No focal lesions. Adrenals/Urinary Tract: The adrenal glands and kidneys are unremarkable and stable. Small right renal calculus but no ureteral or bladder calculi and no renal or bladder mass. Stomach/Bowel: The stomach is unremarkable. There is moderate wall thickening involving the second and third portions of the duodenum with submucosal edema and mucosal enhancement. Findings likely represent duodenitis. The remaining small bowel and colon are unremarkable. Contrast and stool noted in the colon. The terminal ileum is normal. The appendix is normal. Vascular/Lymphatic: Advanced atherosclerotic calcifications involving the aorta but no aneurysm or dissection. The major venous structures are patent. Reproductive: The prostate gland and seminal vesicles are unremarkable. Other: No pelvic mass or adenopathy. No free pelvic fluid collections. No inguinal mass or adenopathy. No abdominal wall hernia or subcutaneous lesions. Musculoskeletal: No significant bony findings. 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 IMPRESSION: 1. CT findings suggest distal esophagitis and duodenitis. 2. Expected postoperative changes from recent cholecystectomy. No findings for abscess, bile leak or hematoma. Electronically Signed   By: Rudie Meyer M.D.   On: 12/03/2016 15:07    Anti-infectives: Anti-infectives    None     Assessment/Plan 1. Epigastric  abdominal pain 2. Coffee-ground emesis/melena  - S/P EGD 12/04/16 showed LA grade D erosive esophagitis and multiple clean-based duodenal ulcers.  - continue BID PPI  - carafate and magic mouthwash according to GI recs; appreciate GI assistance   - PRN pain control, re-start PO oxy-acetaminophen and decrease frequency of dilaudid   - advance diet to soft and re-start home medications   - will add Miralax   3. Chronic pancreatitis S/P laparoscopic cholecystectomy 11/28/16; port sites c/d/i, no evidence of leak on CT, lipase WNL 4.Leukocytosis - improving; suspect inflammatory, no signs of post-operative infection. No free air to suggest perforated viscus. Hold off on empiric abx. If GI feels abx are appropriate then please order. CBC in AM. 5.Normocytic anemia- hgb 7.3, stable, follow 6. Tobacco abuse/COPD - PRN duonebs  7. Hyperlipidemia -  lipitor    LOS: 0 days    Adam Phenix , [facility] 12/05/2016, 9:54 AM Pager: [phone] Consults: [phone] Mon-Fri 7:00 am-4:30 pm Sat-Sun 7:00 am-11:30 am

## 2016-12-05 NOTE — Anesthesia Postprocedure Evaluation (Signed)
Anesthesia Post Note  Patient: OLANREWAJU OSBORN  Procedure(s) Performed: Procedure(s) (LRB): ESOPHAGOGASTRODUODENOSCOPY (EGD) WITH PROPOFOL (N/A)     Patient location during evaluation: Endoscopy Anesthesia Type: MAC Level of consciousness: awake and alert Pain management: pain level controlled Vital Signs Assessment: post-procedure vital signs reviewed and stable Respiratory status: spontaneous breathing, nonlabored ventilation and respiratory function stable Cardiovascular status: stable and blood pressure returned to baseline Anesthetic complications: no Comments: No antiemetics given due to MAC procedure, and no patient complaint of nausea/vomiting.     Last Vitals:  Vitals:   12/05/16 0258 12/05/16 0514  BP: 116/67 103/60  Pulse: 72 77  Resp: 17 17  Temp: 37 C 36.8 C    Last Pain:  Vitals:   12/05/16 0514  TempSrc: Oral  PainSc:                  Catalina Gravel

## 2016-12-05 NOTE — Progress Notes (Signed)
[facility] Progress [facility]  [name] 56 y.o. 1960-09-23  CC:  Upper GI bleed, esophagitis   Subjective: Patient's symptoms improving. No further bleeding episode. Hemoglobin stable. Denied nausea and vomiting. Continues to have substernal burning which is improved compared to admission  ROS : Negative for chest pain and shortness of breath.  Objective: Vital signs in last 24 hours: Vitals:   12/05/16 0258 12/05/16 0514  BP: 116/67 103/60  Pulse: 72 77  Resp: 17 17  Temp: 98.6 F (37 C) 98.3 F (36.8 C)    Physical Exam:  General:  Alert, cooperative, no distress, appears stated age  Head:  Normocephalic, without obvious abnormality, atraumatic  Eyes:  , EOM's intact,   Lungs:   Clear to auscultation bilaterally, respirations unlabored  Heart:  Regular rate and rhythm, S1, S2 normal  Abdomen:   Soft, Mild epigastric tenderness to palpation, nondistended, no peritoneal signs bowel sounds active all four quadrants,  no masses,   Extremities: Extremities normal, atraumatic, no  edema  Pulses: 2+ and symmetric    Lab Results:  Recent Labs  12/03/16 1210 12/04/16 0349  NA 134* 136  K 3.7 4.0  CL 101 107  CO2 21* 22  GLUCOSE 127* 150*  BUN 26* 15  CREATININE 0.68 0.57*  CALCIUM 8.3* 7.5*    Recent Labs  12/03/16 1210 12/04/16 0349  AST 40 28  ALT 94* 59  ALKPHOS 149* 105  BILITOT 0.7 0.2*  PROT 6.3* 4.8*  ALBUMIN 3.0* 2.3*    Recent Labs  12/04/16 1232 12/05/16 0330  WBC 8.9 6.3  HGB 7.5* 7.3*  HCT 23.2* 22.5*  MCV 92.1 93.0  PLT 192 212   No results for input(s): LABPROT, INR in the last 72 hours.    Assessment/Plan: - Coffee-ground emesis and melena. Resolved. EGD yesterday showed LA grade D erosive esophagitis and multiple clean-based duodenal ulcers. - Acute drop in hemoglobin. Hemoglobin stable. - Elevated LFTs. Resolved. - Chronic pancreatitis. Status post cholecystectomy  11/26/2016  Recommendations --------------------------- - Advance diet to soft diet. - Continue IV twice a day PPI, Carafate and Magic mouthwash for now. - Monitor H&H. Transfuse if hemoglobin less than 7. - GI will follow   Kathi DerParag Jawanza Zambito MD, FACP 12/05/2016, 9:49 AM  Pager [phone]  If no answer or after 5 PM call [phone]

## 2016-12-06 LAB — CBC
HCT: 25.4 % — ABNORMAL LOW (ref 39.0–52.0)
Hemoglobin: 8.1 g/dL — ABNORMAL LOW (ref 13.0–17.0)
MCH: 29.5 pg (ref 26.0–34.0)
MCHC: 31.9 g/dL (ref 30.0–36.0)
MCV: 92.4 fL (ref 78.0–100.0)
Platelets: 268 10*3/uL (ref 150–400)
RBC: 2.75 MIL/uL — ABNORMAL LOW (ref 4.22–5.81)
RDW: 13.7 % (ref 11.5–15.5)
WBC: 5.9 10*3/uL (ref 4.0–10.5)

## 2016-12-06 MED ORDER — OXYCODONE-ACETAMINOPHEN 7.5-325 MG PO TABS: 1.0000 | ORAL_TABLET | Freq: Four times a day (QID) | ORAL | 0 refills | Status: DC | PRN

## 2016-12-06 MED ORDER — PANTOPRAZOLE SODIUM 40 MG PO TBEC: 40.0000 mg | DELAYED_RELEASE_TABLET | Freq: Two times a day (BID) | ORAL | Status: DC

## 2016-12-06 MED ORDER — PANTOPRAZOLE SODIUM 40 MG PO TBEC: 40.0000 mg | DELAYED_RELEASE_TABLET | Freq: Two times a day (BID) | ORAL | 1 refills | Status: DC

## 2016-12-06 MED ORDER — SUCRALFATE 1 GM/10ML PO SUSP: 1.0000 g | Freq: Three times a day (TID) | ORAL | 1 refills | Status: DC

## 2016-12-06 NOTE — Discharge Summary (Signed)
[facility] Discharge Summary   Patient ID: [name] MRN: [id number] DOB/AGE: [id number] 56 y.o.  Admit date: 12/03/2016 Discharge date: 12/06/2016  Admitting Diagnosis: Duodenitis  Discharge Diagnosis Patient Active Problem List   Diagnosis Date Noted  . Chronic cholecystitis with calculus s/p lap cholecystectomy 11/28/2016 11/28/2016  . Alcoholic fatty liver 11/28/2016  . Psoriasis   . Duodenitis   . HYPERKALEMIA 02/20/2010  . PSORIASIS 02/16/2009  . Abdominal pain 11/22/2008  . Chronic pancreatitis (HCC) 04/13/2008  . EROSIVE GASTRITIS 02/24/2008  . BLOOD IN STOOL, OCCULT 01/18/2008  . MELENA, HX OF 11/26/2007  . Dyslipidemia 08/13/2007  . TOBACCO ABUSE 04/23/2007    Consultants Parag Brahmbhatt MD - GI  Imaging: CT abdomen pelvis w contrast 12/03/16: 1. CT findings suggest distal esophagitis and duodenitis. 2. Expected postoperative changes from recent cholecystectomy. No findings for abscess, bile leak or hematoma.  Procedures Dr. Levora Angel (12/04/16) - Upper endoscopy  Hospital Course:  [name] is a 56yo male PMH chronic pancreatitis, COPD, and tobacco abuse, who presented to [facility] 12/03/16 with severe abdominal pain. Patient was 5 days out from laparoscopic cholecystectomy by Dr. Michaell Cowing for his chronic pancreatitis. He was discharged home on 11/29/16 in good condition. Patient returned with coffee-ground emesis, melena with acute drop in hemoglobin, and burning epigastric pain. CT findings suggested distal esophagitis and duodenitis. GI was consulted and performed upper endoscopy which showed LA grade D erosive esophagitis with contact oozing as well as multiple clean-based duodenal ulcers. Patient was started on IV twice a day PPI drip, Magic mouth and Carafate. Hemoglobin was monitored and remained stable. Diet was advanced as tolerated. On 12/06/16 the patient was voiding well, tolerating diet, ambulating well, pain well controlled, vital signs  stable and felt stable for discharge home.  Patient will follow up with Dr. Michaell Cowing as scheduled in July, and with GI in about 6 weeks. He will continue PPI and carafate at home. Patient knows to call with questions or concerns.   Patient was referred to an outpatient pain management clinic at discharge.  I have personally reviewed the patients medication history on the [place] controlled substance database.     Physical Exam: Gen: Alert, NAD, cooperative HEENT: EOM's intact, pupils equal and round Card: Regular rate and rhythm Pulm: Normal effort Abd: firm,NT, no hernia or HSM, mild distention, +BS Skin: warm and dry, no rashes Psych: A&Ox3   Allergies as of 12/06/2016   No Known Allergies     Medication List    TAKE these medications   atorvastatin 40 MG tablet Commonly known as:  LIPITOR Take 1 tablet (40 mg total) by mouth daily. What changed:  when to take this   gabapentin 300 MG capsule Commonly known as:  NEURONTIN Take 1 capsule (300 mg total) by mouth 2 (two) times daily. Increase to 4x/day as needed   methocarbamol 750 MG tablet Commonly known as:  ROBAXIN Take 1 tablet (750 mg total) by mouth 4 (four) times daily as needed (use for muscle cramps/pain).   ondansetron 4 MG tablet Commonly known as:  ZOFRAN Take 1 tablet (4 mg total) by mouth every 8 (eight) hours as needed for nausea.   oxyCODONE-acetaminophen 7.5-325 MG tablet Commonly known as:  PERCOCET Take 1 tablet by mouth every 6 (six) hours as needed for moderate pain. What changed:  when to take this  reasons to take this   pantoprazole 40 MG tablet Commonly known as:  PROTONIX Take 1 tablet (40 mg total) by  mouth 2 (two) times daily.   sucralfate 1 GM/10ML suspension Commonly known as:  CARAFATE Take 10 mLs (1 g total) by mouth 4 (four) times daily -  with meals and at bedtime.        Follow-up Information    Karie SodaGross, Steven, MD. Go on 12/30/2016.   Specialty:  General Surgery Why:  Your  appointment is 12/30/16 at 10:00AM. Please arrive 15 minutes prior to your appointment to check in. Contact information: [address] [place] [address] [phone]        Kathi DerBrahmbhatt, Parag, MD. Call in 6 week(s).   Specialty:  Gastroenterology Why:  Call as soon as you leave the hospital to make a follow-up in 6 weeks. You will need a repeat EGD in 8 weeks. Contact information: [address] [place] [address] [phone]           Signed: Edson SnowballBROOKE A MILLER, [facility] 12/06/2016, 10:27 AM Pager: [phone] Consults: [phone] Mon-Fri 7:00 am-4:30 pm Sat-Sun 7:00 am-11:30 am

## 2016-12-06 NOTE — Progress Notes (Addendum)
Pt is tolerating soft diet. Denies abd pain at this time. Discharge instructions given to pt, verbalized understanding. Discharge to home, picked up by a friend.

## 2016-12-06 NOTE — Discharge Instructions (Addendum)
Continue Protonix twice a day for 8 weeks. Continue Carafate for at least 2-4 weeks.   Esophagitis Esophagitis is inflammation of the esophagus. The esophagus is the tube that carries food and liquids from your mouth to your stomach. Esophagitis can cause soreness or pain in the esophagus. This condition can make it difficult and painful to swallow. What are the causes? Most causes of esophagitis are not serious. Common causes of this condition include:  Gastroesophageal reflux disease (GERD). This is when stomach contents move back up into the esophagus (reflux).  Repeated vomiting.  An allergic-type reaction, especially caused by food allergies (eosinophilic esophagitis).  Injury to the esophagus by swallowing large pills with or without water, or swallowing certain types of medicines.  Swallowing (ingesting) harmful chemicals, such as household cleaning products.  Heavy alcohol use.  An infection of the esophagus.This most often occurs in people who have a weakened immune system.  Radiation or chemotherapy treatment for cancer.  Certain diseases such as sarcoidosis, Crohn disease, and scleroderma.  What are the signs or symptoms? Symptoms of this condition include:  Difficult or painful swallowing.  Pain with swallowing acidic liquids, such as citrus juices.  Pain with burping.  Chest pain.  Difficulty breathing.  Nausea.  Vomiting.  Pain in the abdomen.  Weight loss.  Ulcers in the mouth.  Patches of white material in the mouth (candidiasis).  Fever.  Coughing up blood or vomiting blood.  Stool that is black, tarry, or bright red.  How is this diagnosed? Your health care provider will take a medical history and perform a physical exam. You may also have other tests, including:  An endoscopy to examine your stomach and esophagus with a small camera.  A test that measures the acidity level in your esophagus.  A test that measures how much pressure  is on your esophagus.  A barium swallow or modified barium swallow to show the shape, size, and functioning of your esophagus.  Allergy tests.  How is this treated? Treatment for this condition depends on the cause of your esophagitis. In some cases, steroids or other medicines may be given to help relieve your symptoms or to treat the underlying cause of your condition. You may have to make some lifestyle changes, such as:  Avoiding alcohol.  Quitting smoking.  Changing your diet.  Exercising.  Changing your sleep habits and your sleep environment.  Follow these instructions at home: Take these actions to decrease your discomfort and to help avoid complications. Diet  Follow a diet as recommended by your health care provider. This may involve avoiding foods and drinks such as: ? Coffee and tea (with or without caffeine). ? Drinks that contain alcohol. ? Energy drinks and sports drinks. ? Carbonated drinks or sodas. ? Chocolate and cocoa. ? Peppermint and mint flavorings. ? Garlic and onions. ? Horseradish. ? Spicy and acidic foods, including peppers, chili powder, curry powder, vinegar, hot sauces, and barbecue sauce. ? Citrus fruit juices and citrus fruits, such as oranges, lemons, and limes. ? Tomato-based foods, such as red sauce, chili, salsa, and pizza with red sauce. ? Fried and fatty foods, such as donuts, french fries, potato chips, and high-fat dressings. ? High-fat meats, such as hot dogs and fatty cuts of red and white meats, such as rib eye steak, sausage, ham, and bacon. ? High-fat dairy items, such as whole milk, butter, and cream cheese.  Eat small, frequent meals instead of large meals.  Avoid drinking large amounts of liquid with  your meals.  Avoid eating meals during the 2-3 hours before bedtime.  Avoid lying down right after you eat.  Do not exercise right after you eat.  Avoid foods and drinks that seem to make your symptoms worse. General  instructions  Pay attention to any changes in your symptoms.  Take over-the-counter and prescription medicines only as told by your health care provider. Do not take aspirin, ibuprofen, or other NSAIDs unless your health care provider told you to do so.  If you have trouble taking pills, use a pill splitter to decrease the size of the pill. This will decrease the chance of the pill getting stuck or injuring your esophagus on the way down. Also, drink water after you take a pill.  Do not use any tobacco products, including cigarettes, chewing tobacco, and e-cigarettes. If you need help quitting, ask your health care provider.  Wear loose-fitting clothing. Do not wear anything tight around your waist that causes pressure on your abdomen.  Raise (elevate) the head of your bed about 6 inches (15 cm).  Try to reduce your stress, such as with yoga or meditation. If you need help reducing stress, ask your health care provider.  If you are overweight, reduce your weight to an amount that is healthy for you. Ask your health care provider for guidance about a safe weight loss goal.  Keep all follow-up visits as told by your health care provider. This is important. Contact a health care provider if:  You have new symptoms.  You have unexplained weight loss.  You have difficulty swallowing, or it hurts to swallow.  You have wheezing or a persistent cough.  Your symptoms do not improve with treatment.  You have frequent heartburn for more than two weeks. Get help right away if:  You have severe pain in your arms, neck, jaw, teeth, or back.  You feel sweaty, dizzy, or light-headed.  You have chest pain or shortness of breath.  You vomit and your vomit looks like blood or coffee grounds.  Your stool is bloody or black.  You have a fever.  You cannot swallow, drink, or eat. This information is not intended to replace advice given to you by your health care provider. Make sure you  discuss any questions you have with your health care provider. Document Released: 07/18/2004 Document Revised: 11/16/2015 Document Reviewed: 10/05/2014 Elsevier Interactive Patient Education  2018 [employer]    Duodenitis Duodenitis is inflammation of the lining of the first part of the small intestine (duodenum). It is commonly caused by a bacterial infection, which may also lead to open sores (ulcers) in the intestine. Duodenitis may develop suddenly and last for a short time (acute) or it may develop gradually and last for months or years (chronic). What are the causes? The most common cause of duodenitis is an infection from H. pylori bacteria. Other causes of this condition include:  Long-term use of NSAIDs.  Excessive use of alcohol.  An infection of the small intestine (giardiasis).  Crohns disease.  Certain diseases of the immune system.  Certain treatments for cancer.  What increases the risk?  The following factors may make you more likely to develop duodenitis:  Smoking cigarettes.  Drinking alcohol.  Having a family history of duodenitis.  Taking NSAIDs.  Eating a high-fat diet.  What are the signs or symptoms? Symptoms of this condition may include:  Gnawing or burning pain in the upper center of the abdomen (epigastric pain). This may get worse  when the stomach is empty and may get better after eating.  Abdominal cramps.  Nausea and vomiting.  Bloody vomit.  Stools that are bloody, dark, or look like tar.  Diarrhea.  Weight loss.  Fatigue.  How is this diagnosed? This condition may be diagnosed based on your medical history and a physical exam. You may also have tests, such as:  Blood tests.  Stool tests.  A test that checks the gases in your breath.  An X-ray that is done after you swallow a liquid (barium) that makes your digestive tract easier to see.  Endoscopy. This is an exam of the duodenum that is done by putting a thin  tube with a tiny camera on the end down your throat (endoscopy). A sample of tissue from your duodenum (biopsy) may be removed with the endoscope and examined under a microscope for signs of inflammation and infection.  How is this treated? Treatment depends on the cause of your condition. Treatment may include:  Antibiotic medicine to treat H. pylori infection.  Stopping your intake of NSAIDs.  Medicine to reduce stomach acids.  Medicine to treat Crohns disease.  Surgery to treat severe inflammation that causes scarring or severe bleeding.  Follow these instructions at home: Medicines  Take over-the-counter and prescription medicines only as told by your health care provider.  If you were prescribed antibiotic medicine, take it as told by your health care provider. Do not stop taking the antibiotic even if you start to feel better. Eating and drinking   Eat small, frequent meals.  Do not drink alcohol.  Drink enough water to keep your urine clear or pale yellow.  Follow instructions from your health care provider about eating or drinking restrictions. You may be asked to avoid: ? Caffeinated drinks. ? Chocolate. ? Peppermint or mint-flavored food or drinks. ? Garlic. ? Onions. ? Spicy foods. ? Citrus fruits. ? Tomato-based foods. ? Fatty foods. ? Fried foods. Contact a health care provider if:  You have a fever.  Your symptoms come back, get worse, or do not get better with treatment. Get help right away if:  You vomit blood.  You have severe abdominal pain.  Your abdomen swells and is painful.  You have a lot of blood in your stool.  You feel dizzy or light-headed. This information is not intended to replace advice given to you by your health care provider. Make sure you discuss any questions you have with your health care provider. Document Released: 10/05/2012 Document Revised: 11/16/2015 Document Reviewed: 04/13/2015 Elsevier Interactive Patient  Education  2018 [employer].

## 2016-12-06 NOTE — Progress Notes (Signed)
[facility] Progress Note  [name] 56 y.o. [birth year]  CC:  Upper GI bleed, esophagitis   Subjective: Patient's symptoms improving. No further bleeding episode.  Denied nausea and vomiting. Has been transitioned to oral medications.  ROS : Negative for chest pain and shortness of breath.  Objective: Vital signs in last 24 hours: Vitals:   12/05/16 1608 12/05/16 2106  BP: 113/74 107/65  Pulse: 83 77  Resp:    Temp: 98 F (36.7 C) 98.7 F (37.1 C)    Physical Exam:  General:  Alert, cooperative, no distress, appears stated age  Head:  Normocephalic, without obvious abnormality, atraumatic  Eyes:  , EOM's intact,   Lungs:   Clear to auscultation bilaterally, respirations unlabored  Heart:  Regular rate and rhythm, S1, S2 normal  Abdomen:   Soft, Mild epigastric tenderness to palpation, nondistended, no peritoneal signs bowel sounds active all four quadrants,  no masses,   Extremities: Extremities normal, atraumatic, no  edema  Pulses: 2+ and symmetric    Lab Results:  Recent Labs  12/03/16 1210 12/04/16 0349  NA 134* 136  K 3.7 4.0  CL 101 107  CO2 21* 22  GLUCOSE 127* 150*  BUN 26* 15  CREATININE 0.68 0.57*  CALCIUM 8.3* 7.5*    Recent Labs  12/03/16 1210 12/04/16 0349  AST 40 28  ALT 94* 59  ALKPHOS 149* 105  BILITOT 0.7 0.2*  PROT 6.3* 4.8*  ALBUMIN 3.0* 2.3*    Recent Labs  12/05/16 0330 12/06/16 0433  WBC 6.3 5.9  HGB 7.3* 8.1*  HCT 22.5* 25.4*  MCV 93.0 92.4  PLT 212 268   No results for input(s): LABPROT, INR in the last 72 hours.    Assessment/Plan: - Coffee-ground emesis and melena. Resolved. EGD yesterday showed LA grade D erosive esophagitis and multiple clean-based duodenal ulcers. - Acute drop in hemoglobin. Hemoglobin stable. - Elevated LFTs. Resolved. - Chronic pancreatitis. Status post cholecystectomy 11/26/2016  Recommendations --------------------------- - Continue soft diet for now. - Change  Protonix to by mouth twice a day. Continue twice a day PPI for 8 weeks. - Continue  Carafate for at least 2-4 weeks. Continue as needed Magic mouth wash. - Okay to discharge home from GI standpoint. - Follow-up in GI clinic in 6 weeks after discharge. He will need repeat EGD in 8 weeks to document healing of esophagitis. - GI will sign off. Call us back if needed   Kathi DerParag Anuj Summons MD, FACP 12/06/2016, 9:38 AM  Pager [phone]  If no answer or after 5 PM call [phone]

## 2016-12-08 LAB — CULTURE, BLOOD (ROUTINE X 2)
CULTURE: NO GROWTH
Culture: NO GROWTH
Special Requests: ADEQUATE
Special Requests: ADEQUATE

## 2016-12-09 ENCOUNTER — Telehealth: Payer: Self-pay | Admitting: Internal Medicine

## 2016-12-09 NOTE — Telephone Encounter (Signed)
Pt called to cancel Lipid 6-21 due to not being on Lipitor, just got out of the hospital and needs to know when to start back on and what dosage, then when to reschedule lab

## 2016-12-09 NOTE — Telephone Encounter (Signed)
Spoke with pt and he states that he has been off of his statin since surgery last week and had to go back into the hospital and was off of it during hospital stay.  Pt wanted to know when to restart and have labs drawn.  Advised pt ok to go ahead and restart this medication and we can check labs next week.  Pt verbalized understanding and was in agreement with this plan.  Scheduled labs for 6/28.  Pt appreciative for call.

## 2016-12-12 ENCOUNTER — Other Ambulatory Visit: Payer: Medicaid Other

## 2016-12-19 ENCOUNTER — Other Ambulatory Visit: Payer: Medicaid Other | Admitting: *Deleted

## 2016-12-19 DIAGNOSIS — E785 Hyperlipidemia, unspecified: Secondary | ICD-10-CM

## 2016-12-19 LAB — LIPID PANEL
CHOL/HDL RATIO: 2.8 ratio (ref 0.0–5.0)
Cholesterol, Total: 126 mg/dL (ref 100–199)
HDL: 45 mg/dL (ref >39)
LDL Calculated: 29 mg/dL (ref 0–99)
Triglycerides: 260 mg/dL — ABNORMAL HIGH (ref 0–149)
VLDL CHOLESTEROL CAL: 52 mg/dL — AB (ref 5–40)

## 2016-12-24 ENCOUNTER — Telehealth: Payer: Self-pay | Admitting: *Deleted

## 2016-12-24 NOTE — Telephone Encounter (Signed)
-----   Message from Pricilla RifflePaula Ross V, MD sent at 12/19/2016 11:45 PM EDT ----- LIpids much better  Trig high but much better at 260   COntinue to watch carbs   LDL 29  Keep on lipitor

## 2016-12-24 NOTE — Telephone Encounter (Signed)
Pt has been notified of lab results and findings by phone with verbal understanding. Pt thanked me for my call today. Advise pt to continue working on carbohydrates. Pt agreeable to plan of care.

## 2017-01-12 ENCOUNTER — Emergency Department (HOSPITAL_COMMUNITY): Payer: Medicaid Other

## 2017-01-12 ENCOUNTER — Encounter (HOSPITAL_COMMUNITY): Payer: Self-pay | Admitting: Radiology

## 2017-01-12 ENCOUNTER — Emergency Department (HOSPITAL_COMMUNITY)
Admission: EM | Admit: 2017-01-12 | Discharge: 2017-01-12 | Disposition: A | Discharge status: Home / Self Care | Payer: Medicaid Other | Attending: Emergency Medicine | Admitting: Emergency Medicine

## 2017-01-12 DIAGNOSIS — R1011 Right upper quadrant pain: Secondary | ICD-10-CM | POA: Diagnosis not present

## 2017-01-12 DIAGNOSIS — R1010 Upper abdominal pain, unspecified: Secondary | ICD-10-CM | POA: Diagnosis not present

## 2017-01-12 DIAGNOSIS — Z7983 Long term (current) use of bisphosphonates: Secondary | ICD-10-CM | POA: Insufficient documentation

## 2017-01-12 DIAGNOSIS — F17210 Nicotine dependence, cigarettes, uncomplicated: Secondary | ICD-10-CM | POA: Insufficient documentation

## 2017-01-12 DIAGNOSIS — Z79899 Other long term (current) drug therapy: Secondary | ICD-10-CM | POA: Diagnosis not present

## 2017-01-12 DIAGNOSIS — R0602 Shortness of breath: Secondary | ICD-10-CM | POA: Insufficient documentation

## 2017-01-12 LAB — URINALYSIS, ROUTINE W REFLEX MICROSCOPIC
Bilirubin Urine: NEGATIVE
GLUCOSE, UA: NEGATIVE mg/dL
HGB URINE DIPSTICK: NEGATIVE
KETONES UR: NEGATIVE mg/dL
LEUKOCYTES UA: NEGATIVE
Nitrite: NEGATIVE
PH: 5 (ref 5.0–8.0)
PROTEIN: NEGATIVE mg/dL
Specific Gravity, Urine: 1.031 — ABNORMAL HIGH (ref 1.005–1.030)

## 2017-01-12 LAB — CBC WITH DIFFERENTIAL/PLATELET
BASOS ABS: 0 10*3/uL (ref 0.0–0.1)
BASOS PCT: 1 %
Eosinophils Absolute: 0.1 10*3/uL (ref 0.0–0.7)
Eosinophils Relative: 2 %
HEMATOCRIT: 36.5 % — AB (ref 39.0–52.0)
Hemoglobin: 11.4 g/dL — ABNORMAL LOW (ref 13.0–17.0)
LYMPHS PCT: 44 %
Lymphs Abs: 2.9 10*3/uL (ref 0.7–4.0)
MCH: 27.5 pg (ref 26.0–34.0)
MCHC: 31.2 g/dL (ref 30.0–36.0)
MCV: 88 fL (ref 78.0–100.0)
Monocytes Absolute: 0.4 10*3/uL (ref 0.1–1.0)
Monocytes Relative: 5 %
NEUTROS ABS: 3.2 10*3/uL (ref 1.7–7.7)
NEUTROS PCT: 48 %
Platelets: 196 10*3/uL (ref 150–400)
RBC: 4.15 MIL/uL — AB (ref 4.22–5.81)
RDW: 14.3 % (ref 11.5–15.5)
WBC: 6.6 10*3/uL (ref 4.0–10.5)

## 2017-01-12 LAB — COMPREHENSIVE METABOLIC PANEL
ALT: 11 U/L — AB (ref 17–63)
AST: 23 U/L (ref 15–41)
Albumin: 4 g/dL (ref 3.5–5.0)
Alkaline Phosphatase: 77 U/L (ref 38–126)
Anion gap: 10 (ref 5–15)
BILIRUBIN TOTAL: 0.6 mg/dL (ref 0.3–1.2)
BUN: 15 mg/dL (ref 6–20)
CO2: 24 mmol/L (ref 22–32)
Calcium: 9.5 mg/dL (ref 8.9–10.3)
Chloride: 105 mmol/L (ref 101–111)
Creatinine, Ser: 0.81 mg/dL (ref 0.61–1.24)
GFR calc Af Amer: >60 mL/min (ref >60)
GLUCOSE: 139 mg/dL — AB (ref 65–99)
POTASSIUM: 3.5 mmol/L (ref 3.5–5.1)
Sodium: 139 mmol/L (ref 135–145)
TOTAL PROTEIN: 6.8 g/dL (ref 6.5–8.1)

## 2017-01-12 LAB — I-STAT CG4 LACTIC ACID, ED
LACTIC ACID, VENOUS: 3.01 mmol/L — AB (ref 0.5–1.9)
LACTIC ACID, VENOUS: 2.96 mmol/L — AB (ref 0.5–1.9)

## 2017-01-12 LAB — I-STAT TROPONIN, ED: Troponin i, poc: 0 ng/mL (ref 0.00–0.08)

## 2017-01-12 LAB — LIPASE, BLOOD: LIPASE: 42 U/L (ref 11–51)

## 2017-01-12 LAB — POC OCCULT BLOOD, ED: FECAL OCCULT BLD: NEGATIVE

## 2017-01-12 LAB — ETHANOL: Alcohol, Ethyl (B): <5 mg/dL (ref <5)

## 2017-01-12 MED ORDER — ONDANSETRON HCL 4 MG/2ML IJ SOLN
4.0000 mg | Freq: Once | INTRAMUSCULAR | Status: AC
  Administered 2017-01-12: 4 mg via INTRAVENOUS
  Filled 2017-01-12: qty 2

## 2017-01-12 MED ORDER — PIPERACILLIN-TAZOBACTAM 3.375 G IVPB 30 MIN
3.3750 g | Freq: Once | INTRAVENOUS | Status: AC
  Administered 2017-01-12: 3.375 g via INTRAVENOUS
  Filled 2017-01-12: qty 50

## 2017-01-12 MED ORDER — SODIUM CHLORIDE 0.9 % IV BOLUS (SEPSIS): 1000.0000 mL | Freq: Once | INTRAVENOUS | Status: DC

## 2017-01-12 MED ORDER — PANTOPRAZOLE SODIUM 40 MG IV SOLR
40.0000 mg | Freq: Once | INTRAVENOUS | Status: AC
  Administered 2017-01-12: 40 mg via INTRAVENOUS
  Filled 2017-01-12: qty 40

## 2017-01-12 MED ORDER — IOPAMIDOL (ISOVUE-300) INJECTION 61%
INTRAVENOUS | Status: AC
  Administered 2017-01-12: 75 mL
  Filled 2017-01-12: qty 100

## 2017-01-12 MED ORDER — PROMETHAZINE HCL 25 MG/ML IJ SOLN
25.0000 mg | Freq: Once | INTRAMUSCULAR | Status: AC
  Administered 2017-01-12: 25 mg via INTRAVENOUS
  Filled 2017-01-12: qty 1

## 2017-01-12 MED ORDER — SODIUM CHLORIDE 0.9 % IV BOLUS (SEPSIS)
1000.0000 mL | Freq: Once | INTRAVENOUS | Status: AC
  Administered 2017-01-12: 1000 mL via INTRAVENOUS

## 2017-01-12 MED ORDER — FENTANYL CITRATE (PF) 100 MCG/2ML IJ SOLN
50.0000 ug | Freq: Once | INTRAMUSCULAR | Status: AC
  Administered 2017-01-12: 50 ug via INTRAVENOUS
  Filled 2017-01-12: qty 2

## 2017-01-12 MED ORDER — FENTANYL CITRATE (PF) 100 MCG/2ML IJ SOLN
50.0000 ug | INTRAMUSCULAR | Status: DC | PRN
  Administered 2017-01-12 (×2): 50 ug via INTRAVENOUS
  Filled 2017-01-12 (×2): qty 2

## 2017-01-12 NOTE — ED Provider Notes (Signed)
[facility]PT Provider Note   CSN: [id number] Arrival date & time: 01/12/17  4098     History   Chief Complaint No chief complaint on file.   HPI [name] is a 56 y.o. male.  HPI   56 year old male with history of chronic pancreatitis, duodenitis, history of alcohol abuse and stomach ulcer presenting complaining of abdominal pain. Patient report for the past week he has had progressive worsening abdominal pain. Pain is resolved to his upper abdomen radiates to his back, with associate nausea, dry heaves, and having loose stools. Pain is rated as a 9 out of 10 and felt similar to prior pain that he had past relating to his pancreatitis. He feels dehydrated as he is unable to keep anything down. He endorsed having some increased risk of breath and wheezing when the pain is severe. Admits that has a history of COPD and current shortness of breath is similar to that. He had a cholecystectomy performed last month by Dr. Michaell Cowing. He has not drank any alcohol for at least 10 years. He is still continues to smoke. He denies having associated fever, chills, dysuria. He notice his diarrhea does have trace of blood noted which is not unusual.    Past Medical History:  Diagnosis Date  . Chronic pancreatitis (HCC)   . Depression   . Duodenitis    by EGD 1/07  . Emphysema    unclear diagnosis noted in previous EMR  . Heme positive stool    neg colonoscopies 11/2008 and 01/2008  . History of alcohol abuse   . Hyperlipidemia   . Psoriasis    extensive, responds to strong steroids, unable to start molecular therapies due to cost  . RUE weakness    2/2 cervical spondylosis  . Smoking   . Stomach ulcer     Patient Active Problem List   Diagnosis Date Noted  . Chronic cholecystitis with calculus s/p lap cholecystectomy 11/28/2016 11/28/2016  . Alcoholic fatty liver 11/28/2016  . Psoriasis   . Duodenitis   . HYPERKALEMIA 02/20/2010  . PSORIASIS 02/16/2009  . Abdominal pain  11/22/2008  . Chronic pancreatitis (HCC) 04/13/2008  . EROSIVE GASTRITIS 02/24/2008  . BLOOD IN STOOL, OCCULT 01/18/2008  . MELENA, HX OF 11/26/2007  . Dyslipidemia 08/13/2007  . TOBACCO ABUSE 04/23/2007    Past Surgical History:  Procedure Laterality Date  . ESOPHAGOGASTRODUODENOSCOPY (EGD) WITH PROPOFOL N/A 12/04/2016   Procedure: ESOPHAGOGASTRODUODENOSCOPY (EGD) WITH PROPOFOL;  Surgeon: Kathi Der, MD;  Location: [facility];  Service: Gastroenterology;  Laterality: N/A;  . LAPAROSCOPIC CHOLECYSTECTOMY  11/28/2016   w/IOC  . LAPAROSCOPIC CHOLECYSTECTOMY SINGLE SITE WITH INTRAOPERATIVE CHOLANGIOGRAM N/A 11/28/2016   Procedure: LAPAROSCOPIC CHOLECYSTECTOMY SINGLE SITE WITH INTRAOPERATIVE CHOLANGIOGRAM;  Surgeon: Karie Soda, MD;  Location: [facility];  Service: General;  Laterality: N/A;  . LIVER BIOPSY N/A 11/28/2016   Procedure: NEEDLE CORE LIVER BIOPSY;  Surgeon: Karie Soda, MD;  Location: [facility];  Service: General;  Laterality: N/A;  . TYMPANOSTOMY TUBE PLACEMENT Bilateral ~ 1970       Home Medications    Prior to Admission medications   Medication Sig Start Date End Date Taking? Authorizing Provider  atorvastatin (LIPITOR) 40 MG tablet Take 1 tablet (40 mg total) by mouth daily. Patient taking differently: Take 40 mg by mouth at bedtime.  10/15/16 01/13/17  Pricilla Riffle, MD  gabapentin (NEURONTIN) 300 MG capsule Take 1 capsule (300 mg total) by mouth 2 (two) times daily. Increase to 4x/day as needed 11/28/16  Karie Soda, MD  methocarbamol (ROBAXIN) 750 MG tablet Take 1 tablet (750 mg total) by mouth 4 (four) times daily as needed (use for muscle cramps/pain). 11/28/16   Karie Soda, MD  ondansetron (ZOFRAN) 4 MG tablet Take 1 tablet (4 mg total) by mouth every 8 (eight) hours as needed for nausea. 11/28/16   Karie Soda, MD  oxyCODONE-acetaminophen (PERCOCET) 7.5-325 MG tablet Take 1 tablet by mouth every 6 (six) hours as needed for moderate pain. 12/06/16   Evangeline Gula  A, PA-C  pantoprazole (PROTONIX) 40 MG tablet Take 1 tablet (40 mg total) by mouth 2 (two) times daily. 12/06/16   Evangeline Gula A, PA-C  sucralfate (CARAFATE) 1 GM/10ML suspension Take 10 mLs (1 g total) by mouth 4 (four) times daily -  with meals and at bedtime. 12/06/16   Edson Snowball, PA-C    Family History Family History  Problem Relation Age of Onset  . Diabetes Mother   . Heart disease Father   . Cancer Father        ?kidney or liver cancer    Social History Social History  Substance Use Topics  . Smoking status: Current Every Day Smoker    Packs/day: 1.00    Years: 40.00    Types: Cigarettes  . Smokeless tobacco: Never Used  . Alcohol use Yes     Comment: 12/03/2016 "nothing since 2008"     Allergies   Nsaids   Review of Systems Review of Systems  All other systems reviewed and are negative.    Physical Exam Updated Vital Signs There were no vitals taken for this visit.  Physical Exam  Constitutional: He appears well-developed and well-nourished. No distress.  Chronically ill-appearing male nontoxic in appearance  HENT:  Head: Atraumatic.  Mouth is dry  Eyes: Conjunctivae are normal.  Neck: Neck supple. No JVD present.  Cardiovascular:  Tachycardia without murmurs rubs or gallops  Pulmonary/Chest:  Mildly tachypneic, no overt wheezes, rales, or rhonchi  Abdominal: Soft. Bowel sounds are normal. He exhibits no distension. There is tenderness (Tenderness to epigastric region on palpation without guarding or rebound tenderness. Negative Murphy sign no pain at McBurney's point).  Neurological: He is alert.  Skin: No rash noted.  Psychiatric: He has a normal mood and affect.  Nursing note and vitals reviewed.    ED Treatments / Results  Labs (all labs ordered are listed, but only abnormal results are displayed) Labs Reviewed - No data to display  EKG  EKG Interpretation None       Radiology No results found.  Procedures Procedures  (including critical care time)  Medications Ordered in ED Medications - No data to display   Initial Impression / Assessment and Plan / ED Course  I have reviewed the triage vital signs and the nursing notes.  Pertinent labs & imaging results that were available during my care of the patient were reviewed by me and considered in my medical decision making (see chart for details).     BP 90/70   Pulse 91   Temp 98.2 F (36.8 C) (Rectal)   Resp 18   Ht 5\' 8"  (1.727 m)   Wt 58.1 kg (128 lb)   SpO2 99%   BMI 19.46 kg/m  Repeat BP is 110/72 while I was in the room.   Final Clinical Impressions(s) / ED Diagnoses   Final diagnoses:  Upper abdominal pain    New Prescriptions New Prescriptions   No medications on file  10:20 AM Patient here with acute on chronic upper abdominal pain, history of chronic pancreatitis no recent alcohol use. History of COPD initially came in wheezing which moderately improved after receiving breathing treatment EMS. He had a cholecystectomy procedure done last month and report having some drainage coming from his umbilicus at the insertion site. His umbilicus appears normal without any active drainage at this time. Was admitted on 06/17 for upper abd pain and found to have significant erosive esophagitis/duodenitis.  His sxs today felt similar to last hospitalization.  Work up initiated. We'll focus on treating symptoms, will monitor closely.  11:36 AM Patient initially hypotensive with a blood pressure of 85/64, tachycardic with a heart rate of 112 and mildly hypoxic with an O2 of 90% on room air. Patient giving supplemental oxygen as well as breathing treatment. He has a normal rectal temp of 98.2 however his lactic acid is elevated at 3.01. Normal white count. Given abdominal pain hypertension and elevated lactic acid, code sepsis initiated. Patient will receive IV fluid at 30 mL per kilogram. Zosyn antibiotic given. Plan to obtain abdominal and  pelvis CT scan for further evaluation.  3:36 PM  Urine shows no signs of your tract infection. Fecal occult blood test is negative. Rectal tenderness normal. A repeat lactic acid is 2.96 minimally improved from previous lactic acid.  Troponin is negative. Patient has normal white count. His electrolytes panel are reassuring. Normal lipase, alcohol level was undetectable, chest x-ray without acute cardiopulmonary disease. An abdominal and pelvis CT scan showing signs of chronic hepatitis but no evidence to suggest acute pancreatitis. 4 mm nonobstructive stone on the right kidney without hydronephrosis. Normal appendix, no evidence of bowel obstruction and no other concerning feature on CT scan. Patient's blood pressure has improved with fluid. I discussed finding with Dr. Patria Maneampos, we felt that patient is stable to be discharge with outpatient GI follow-up. Patient discharged home with encouragement to continue with PPI and Carafate. Suspect esophagitis/duodenitis  causing symptoms. Pt will f/u with GI specialist and his PCP for further care.  Return precaution given.     Fayrene Helperran, Chalsey Leeth, PA-C 01/12/17 1543    Azalia Bilisampos, Kevin, MD 01/13/17 [phone]

## 2017-01-12 NOTE — ED Notes (Signed)
Patient to xray.

## 2017-01-12 NOTE — Progress Notes (Signed)
Pharmacy Antibiotic Note  [name] is a 56 y.o. male admitted on 01/12/2017 with intra-abdominal infection.  Pharmacy has been consulted for Pip/Tazo dosing.  He received 3.375gm IV at 12:07PM without noted complications.  Lactic acid 3.0, WBC 6.6, Creat. 0.81, est. crcl ~ 885ml/min.  Plan: Pip/Tazo 3.375 gm IV every 8 hours to infuse over 4 hours. Monitor renal function, clinical response and LOT F/U culture data and assist with de-escalation as appropriate  Height: 5\' 8"  (172.7 cm) Weight: 128 lb (58.1 kg) IBW/kg (Calculated) : 68.4  Temp (24hrs), Avg:98.2 F (36.8 C), Min:98.2 F (36.8 C), Max:98.2 F (36.8 C)   Recent Labs Lab 01/12/17 1047 01/12/17 1122  WBC 6.6  --   CREATININE 0.81  --   LATICACIDVEN  --  3.01*    Estimated Creatinine Clearance: 85.7 mL/min (by C-G formula based on SCr of 0.81 mg/dL).    Allergies  Allergen Reactions  . Nsaids Other (See Comments)    Bleeding gastritis    Antimicrobials this admission: Pip/Tazo 7/22>>  Microbiology results: 7/22  BCx: >>  [name], PharmD., MS Clinical Pharmacist Pager:  [phone] Thank you for allowing pharmacy to be part of this patients care team. 01/12/2017 12:20 PM

## 2017-01-12 NOTE — ED Triage Notes (Signed)
Patient arrived to ED via [facility] from home. EMS reports:  Patient c/o RUQ abdominal pain and shortness of breath. Wheezing noted. Neb treatment x 2. Fentanyl 100 mcg. Zofran 4 mg ODT. NSR on monitor.  Hx - COPD, Asthma, Pancreatitis, Gall bladder sx in Jan. BP 126/74, Pulse 104, Resp 24, 100% on NRB. 22 L FA.

## 2017-01-12 NOTE — Discharge Instructions (Signed)
Your pain is likely due to erosive esophagitis/duodenitis.  Continue taking protonix and carafate as previously prescribed by your doctor.  Follow up closely with GI specialist for further care.

## 2017-01-17 LAB — CULTURE, BLOOD (ROUTINE X 2)
Culture: NO GROWTH
Culture: NO GROWTH
SPECIAL REQUESTS: ADEQUATE
Special Requests: ADEQUATE

## 2017-01-27 ENCOUNTER — Encounter (HOSPITAL_COMMUNITY): Payer: Self-pay | Admitting: Emergency Medicine

## 2017-01-27 DIAGNOSIS — Z8719 Personal history of other diseases of the digestive system: Secondary | ICD-10-CM | POA: Diagnosis not present

## 2017-01-27 DIAGNOSIS — R1013 Epigastric pain: Secondary | ICD-10-CM | POA: Diagnosis not present

## 2017-01-27 DIAGNOSIS — Z79899 Other long term (current) drug therapy: Secondary | ICD-10-CM | POA: Diagnosis not present

## 2017-01-27 DIAGNOSIS — F17210 Nicotine dependence, cigarettes, uncomplicated: Secondary | ICD-10-CM | POA: Insufficient documentation

## 2017-01-27 DIAGNOSIS — R1011 Right upper quadrant pain: Secondary | ICD-10-CM | POA: Diagnosis present

## 2017-01-27 LAB — CBC
HEMATOCRIT: 42.2 % (ref 39.0–52.0)
Hemoglobin: 13.8 g/dL (ref 13.0–17.0)
MCH: 27.8 pg (ref 26.0–34.0)
MCHC: 32.7 g/dL (ref 30.0–36.0)
MCV: 85.1 fL (ref 78.0–100.0)
Platelets: 175 10*3/uL (ref 150–400)
RBC: 4.96 MIL/uL (ref 4.22–5.81)
RDW: 14.1 % (ref 11.5–15.5)
WBC: 11.1 10*3/uL — AB (ref 4.0–10.5)

## 2017-01-27 LAB — COMPREHENSIVE METABOLIC PANEL
ALT: 18 U/L (ref 17–63)
ANION GAP: 15 (ref 5–15)
AST: 34 U/L (ref 15–41)
Albumin: 4.5 g/dL (ref 3.5–5.0)
Alkaline Phosphatase: 96 U/L (ref 38–126)
BILIRUBIN TOTAL: 0.6 mg/dL (ref 0.3–1.2)
BUN: 15 mg/dL (ref 6–20)
CHLORIDE: 100 mmol/L — AB (ref 101–111)
CO2: 22 mmol/L (ref 22–32)
Calcium: 9.9 mg/dL (ref 8.9–10.3)
Creatinine, Ser: 0.86 mg/dL (ref 0.61–1.24)
Glucose, Bld: 112 mg/dL — ABNORMAL HIGH (ref 65–99)
POTASSIUM: 3.4 mmol/L — AB (ref 3.5–5.1)
Sodium: 137 mmol/L (ref 135–145)
TOTAL PROTEIN: 7.7 g/dL (ref 6.5–8.1)

## 2017-01-27 LAB — URINALYSIS, ROUTINE W REFLEX MICROSCOPIC
Bilirubin Urine: NEGATIVE
GLUCOSE, UA: NEGATIVE mg/dL
Hgb urine dipstick: NEGATIVE
KETONES UR: 5 mg/dL — AB
LEUKOCYTES UA: NEGATIVE
NITRITE: NEGATIVE
PH: 5 (ref 5.0–8.0)
PROTEIN: NEGATIVE mg/dL
Specific Gravity, Urine: 1.026 (ref 1.005–1.030)

## 2017-01-27 LAB — LIPASE, BLOOD: LIPASE: 30 U/L (ref 11–51)

## 2017-01-27 MED ORDER — ONDANSETRON 4 MG PO TBDP
4.0000 mg | ORAL_TABLET | Freq: Once | ORAL | Status: AC | PRN
  Administered 2017-01-27: 4 mg via ORAL

## 2017-01-27 MED ORDER — ONDANSETRON 4 MG PO TBDP
ORAL_TABLET | ORAL | Status: AC
  Filled 2017-01-27: qty 1

## 2017-01-27 NOTE — ED Triage Notes (Signed)
Pt c/o RUQ pain with nausea. Pt states he has chronic pancreatitis with a recent admission. Denies alcohol use. Pain began 3 days ago.

## 2017-01-28 ENCOUNTER — Emergency Department (HOSPITAL_COMMUNITY)
Admission: EM | Admit: 2017-01-28 | Discharge: 2017-01-28 | Disposition: A | Discharge status: Home / Self Care | Payer: Medicaid Other | Attending: Emergency Medicine | Admitting: Emergency Medicine

## 2017-01-28 DIAGNOSIS — R1013 Epigastric pain: Secondary | ICD-10-CM

## 2017-01-28 DIAGNOSIS — Z8719 Personal history of other diseases of the digestive system: Secondary | ICD-10-CM

## 2017-01-28 MED ORDER — GI COCKTAIL ~~LOC~~
30.0000 mL | Freq: Once | ORAL | Status: AC
  Administered 2017-01-28: 30 mL via ORAL
  Filled 2017-01-28: qty 30

## 2017-01-28 MED ORDER — MORPHINE SULFATE (PF) 4 MG/ML IV SOLN
4.0000 mg | Freq: Once | INTRAVENOUS | Status: AC
  Administered 2017-01-28: 4 mg via INTRAVENOUS
  Filled 2017-01-28: qty 1

## 2017-01-28 MED ORDER — ONDANSETRON HCL 4 MG/2ML IJ SOLN
4.0000 mg | Freq: Once | INTRAMUSCULAR | Status: AC
  Administered 2017-01-28: 4 mg via INTRAVENOUS
  Filled 2017-01-28: qty 2

## 2017-01-28 MED ORDER — PANTOPRAZOLE SODIUM 40 MG IV SOLR
40.0000 mg | Freq: Once | INTRAVENOUS | Status: AC
  Administered 2017-01-28: 40 mg via INTRAVENOUS
  Filled 2017-01-28: qty 40

## 2017-01-28 MED ORDER — HYDROCODONE-ACETAMINOPHEN 5-325 MG PO TABS: 1.0000 | ORAL_TABLET | Freq: Four times a day (QID) | ORAL | 0 refills | Status: DC | PRN

## 2017-01-28 MED ORDER — SODIUM CHLORIDE 0.9 % IV BOLUS (SEPSIS)
1000.0000 mL | Freq: Once | INTRAVENOUS | Status: AC
  Administered 2017-01-28: 1000 mL via INTRAVENOUS

## 2017-01-28 NOTE — ED Provider Notes (Signed)
[facility] Provider Note   CSN: [id number] Arrival date & time: 01/27/17  1630  By signing my name below, I, Ny'Kea Lewis, attest that this documentation has been prepared under the direction and in the presence of Geoffery Lyonselo, Breeona Waid, MD. Electronically Signed: Karren CobbleNy'Kea Lewis, ED Scribe. 01/28/17. 1:51 AM.  History   Chief Complaint Chief Complaint  Patient presents with  . Pancreatitis   The history is provided by the patient. No language interpreter was used.    HPI HPI Comments: [name] is a 56 y.o. male who presents to the Emergency Department complaining of gradually worsening, perisitent, acute on chronic right upper quadrant abdominal pain that began three days ago. He notes associated right lower back pain. He endorses a history of pancreatitis and state his pain is similar to previously episodes. Pt reports recent cholecystectomy. He states he is  complaint with his medications. Denies alcohol usage.  He denies fever, chills, or cough.     Past Medical History:  Diagnosis Date  . Chronic pancreatitis (HCC)   . Depression   . Duodenitis    by EGD 1/07  . Emphysema    unclear diagnosis noted in previous EMR  . Heme positive stool    neg colonoscopies 11/2008 and 01/2008  . History of alcohol abuse   . Hyperlipidemia   . Psoriasis    extensive, responds to strong steroids, unable to start molecular therapies due to cost  . RUE weakness    2/2 cervical spondylosis  . Smoking   . Stomach ulcer     Patient Active Problem List   Diagnosis Date Noted  . Chronic cholecystitis with calculus s/p lap cholecystectomy 11/28/2016 11/28/2016  . Alcoholic fatty liver 11/28/2016  . Psoriasis   . Duodenitis   . HYPERKALEMIA 02/20/2010  . PSORIASIS 02/16/2009  . Abdominal pain 11/22/2008  . Chronic pancreatitis (HCC) 04/13/2008  . EROSIVE GASTRITIS 02/24/2008  . BLOOD IN STOOL, OCCULT 01/18/2008  . MELENA, HX OF 11/26/2007  . Dyslipidemia 08/13/2007  . TOBACCO ABUSE  04/23/2007    Past Surgical History:  Procedure Laterality Date  . ESOPHAGOGASTRODUODENOSCOPY (EGD) WITH PROPOFOL N/A 12/04/2016   Procedure: ESOPHAGOGASTRODUODENOSCOPY (EGD) WITH PROPOFOL;  Surgeon: Kathi DerBrahmbhatt, Parag, MD;  Location: [facility];  Service: Gastroenterology;  Laterality: N/A;  . LAPAROSCOPIC CHOLECYSTECTOMY  11/28/2016   w/IOC  . LAPAROSCOPIC CHOLECYSTECTOMY SINGLE SITE WITH INTRAOPERATIVE CHOLANGIOGRAM N/A 11/28/2016   Procedure: LAPAROSCOPIC CHOLECYSTECTOMY SINGLE SITE WITH INTRAOPERATIVE CHOLANGIOGRAM;  Surgeon: Karie SodaGross, Steven, MD;  Location: [facility];  Service: General;  Laterality: N/A;  . LIVER BIOPSY N/A 11/28/2016   Procedure: NEEDLE CORE LIVER BIOPSY;  Surgeon: Karie SodaGross, Steven, MD;  Location: [facility];  Service: General;  Laterality: N/A;  . TYMPANOSTOMY TUBE PLACEMENT Bilateral ~ 1970    Home Medications    Prior to Admission medications   Medication Sig Start Date End Date Taking? Authorizing Provider  atorvastatin (LIPITOR) 40 MG tablet Take 1 tablet (40 mg total) by mouth daily. Patient taking differently: Take 40 mg by mouth at bedtime.  10/15/16 01/13/17  Pricilla Riffleoss, Paula V, MD  gabapentin (NEURONTIN) 300 MG capsule Take 1 capsule (300 mg total) by mouth 2 (two) times daily. Increase to 4x/day as needed 11/28/16   Karie SodaGross, Steven, MD  methocarbamol (ROBAXIN) 750 MG tablet Take 1 tablet (750 mg total) by mouth 4 (four) times daily as needed (use for muscle cramps/pain). Patient not taking: Reported on 01/12/2017 11/28/16   Karie SodaGross, Steven, MD  ondansetron (ZOFRAN) 4 MG tablet Take 1  tablet (4 mg total) by mouth every 8 (eight) hours as needed for nausea. 11/28/16   Karie Soda, MD  oxyCODONE-acetaminophen (PERCOCET) 7.5-325 MG tablet Take 1 tablet by mouth every 6 (six) hours as needed for moderate pain. 12/06/16   Meuth, Brooke A, PA-C  pantoprazole (PROTONIX) 40 MG tablet Take 1 tablet (40 mg total) by mouth 2 (two) times daily. 12/06/16   Meuth, Brooke A, PA-C  sucralfate (CARAFATE) 1  GM/10ML suspension Take 10 mLs (1 g total) by mouth 4 (four) times daily -  with meals and at bedtime. Patient not taking: Reported on 01/12/2017 12/06/16   Franne Forts, PA-C   Family History Family History  Problem Relation Age of Onset  . Diabetes Mother   . Heart disease Father   . Cancer Father        ?kidney or liver cancer   Social History Social History  Substance Use Topics  . Smoking status: Current Every Day Smoker    Packs/day: 1.00    Years: 40.00    Types: Cigarettes  . Smokeless tobacco: Never Used  . Alcohol use Yes     Comment: 12/03/2016 "nothing since 2008"   Allergies   Nsaids  Review of Systems Review of Systems  Constitutional: Negative for chills and fever.  Respiratory: Negative for cough.   Gastrointestinal: Positive for abdominal pain.  Musculoskeletal: Positive for back pain.   A complete 10 system review of systems was obtained and all systems are negative except as noted in the HPI and PMH.   Physical Exam Updated Vital Signs BP 104/71 (BP Location: Left Arm)   Pulse 90   Temp 97.8 F (36.6 C) (Oral)   Resp 14   SpO2 98%   Physical Exam  Constitutional: He is oriented to person, place, and time. He appears well-developed and well-nourished.  HENT:  Head: Normocephalic and atraumatic.  Eyes: EOM are normal.  Neck: Normal range of motion.  Cardiovascular: Normal rate, regular rhythm, normal heart sounds and intact distal pulses.   Pulmonary/Chest: Effort normal and breath sounds normal. No respiratory distress.  Abdominal: Soft. Bowel sounds are normal. He exhibits no distension. There is tenderness. There is no rebound and no guarding.  TTP in the epigastrium.  Musculoskeletal: Normal range of motion.  Neurological: He is alert and oriented to person, place, and time.  Skin: Skin is warm and dry.  Psychiatric: He has a normal mood and affect. Judgment normal.  Nursing note and vitals reviewed.  ED Treatments / Results  DIAGNOSTIC  STUDIES: Oxygen Saturation is 98% on RA, normal by my interpretation.   COORDINATION OF CARE: 1:11 AM-Discussed next steps with pt. Pt verbalized understanding and is agreeable with the plan.   Labs (all labs ordered are listed, but only abnormal results are displayed) Labs Reviewed  COMPREHENSIVE METABOLIC PANEL - Abnormal; Notable for the following:       Result Value   Potassium 3.4 (*)    Chloride 100 (*)    Glucose, Bld 112 (*)    All other components within normal limits  CBC - Abnormal; Notable for the following:    WBC 11.1 (*)    All other components within normal limits  URINALYSIS, ROUTINE W REFLEX MICROSCOPIC - Abnormal; Notable for the following:    APPearance HAZY (*)    Ketones, ur 5 (*)    All other components within normal limits  LIPASE, BLOOD   EKG  EKG Interpretation None      Radiology  No results found.  Procedures Procedures (including critical care time)  Medications Ordered in ED Medications  ondansetron (ZOFRAN-ODT) 4 MG disintegrating tablet (not administered)  ondansetron (ZOFRAN-ODT) disintegrating tablet 4 mg (4 mg Oral Given 01/27/17 1804)   Initial Impression / Assessment and Plan / ED Course  I have reviewed the triage vital signs and the nursing notes.  Pertinent labs & imaging results that were available during my care of the patient were reviewed by me and considered in my medical decision making (see chart for details).  Patient feels better after IV fluids and medications in the ED. His laboratory studies are reassuring. He has a history of pancreatitis, however his lipase today is normal. I suspect possible flareup of gastritis. He will be discharged with clear liquids, pain medicine, and follow-up as needed.  Final Clinical Impressions(s) / ED Diagnoses   Final diagnoses:  None   New Prescriptions New Prescriptions   No medications on file  I personally performed the services described in this documentation, which was  scribed in my presence. The recorded information has been reviewed and is accurate.        Geoffery Lyons, MD 01/28/17 [phone]

## 2017-01-28 NOTE — Discharge Instructions (Signed)
Clear liquid diet for the next several days.  Hydrocodone as prescribed as needed for pain.  You are to follow up with your primary Dr. in the next 2-3 days for a recheck, and return to the emergency department if you develop worsening pain, high fevers, bloody stools, or other new and concerning symptoms.

## 2017-03-07 DIAGNOSIS — K860 Alcohol-induced chronic pancreatitis: Secondary | ICD-10-CM | POA: Diagnosis not present

## 2017-03-07 DIAGNOSIS — J441 Chronic obstructive pulmonary disease with (acute) exacerbation: Secondary | ICD-10-CM | POA: Diagnosis not present

## 2017-03-07 DIAGNOSIS — K2210 Ulcer of esophagus without bleeding: Secondary | ICD-10-CM | POA: Diagnosis not present

## 2017-03-07 DIAGNOSIS — G8929 Other chronic pain: Secondary | ICD-10-CM | POA: Diagnosis not present

## 2017-03-28 ENCOUNTER — Emergency Department (HOSPITAL_COMMUNITY)
Admission: EM | Admit: 2017-03-28 | Discharge: 2017-03-28 | Disposition: A | Discharge status: Home / Self Care | Payer: Medicaid Other | Attending: Emergency Medicine | Admitting: Emergency Medicine

## 2017-03-28 ENCOUNTER — Encounter (HOSPITAL_COMMUNITY): Payer: Self-pay | Admitting: *Deleted

## 2017-03-28 DIAGNOSIS — R112 Nausea with vomiting, unspecified: Secondary | ICD-10-CM | POA: Insufficient documentation

## 2017-03-28 DIAGNOSIS — R1013 Epigastric pain: Secondary | ICD-10-CM | POA: Insufficient documentation

## 2017-03-28 DIAGNOSIS — Z79899 Other long term (current) drug therapy: Secondary | ICD-10-CM | POA: Insufficient documentation

## 2017-03-28 DIAGNOSIS — F17210 Nicotine dependence, cigarettes, uncomplicated: Secondary | ICD-10-CM | POA: Insufficient documentation

## 2017-03-28 LAB — COMPREHENSIVE METABOLIC PANEL
ALT: 10 U/L — AB (ref 17–63)
AST: 29 U/L (ref 15–41)
Albumin: 4.2 g/dL (ref 3.5–5.0)
Alkaline Phosphatase: 113 U/L (ref 38–126)
Anion gap: 15 (ref 5–15)
BUN: 17 mg/dL (ref 6–20)
CALCIUM: 9.6 mg/dL (ref 8.9–10.3)
CHLORIDE: 103 mmol/L (ref 101–111)
CO2: 21 mmol/L — ABNORMAL LOW (ref 22–32)
CREATININE: 0.8 mg/dL (ref 0.61–1.24)
Glucose, Bld: 88 mg/dL (ref 65–99)
Potassium: 4.7 mmol/L (ref 3.5–5.1)
Sodium: 139 mmol/L (ref 135–145)
TOTAL PROTEIN: 7.7 g/dL (ref 6.5–8.1)
Total Bilirubin: 0.6 mg/dL (ref 0.3–1.2)

## 2017-03-28 LAB — CBC
HEMATOCRIT: 44.4 % (ref 39.0–52.0)
Hemoglobin: 14.9 g/dL (ref 13.0–17.0)
MCH: 28.9 pg (ref 26.0–34.0)
MCHC: 33.6 g/dL (ref 30.0–36.0)
MCV: 86 fL (ref 78.0–100.0)
PLATELETS: 203 10*3/uL (ref 150–400)
RBC: 5.16 MIL/uL (ref 4.22–5.81)
RDW: 15.5 % (ref 11.5–15.5)
WBC: 7.6 10*3/uL (ref 4.0–10.5)

## 2017-03-28 LAB — POC OCCULT BLOOD, ED: FECAL OCCULT BLD: NEGATIVE

## 2017-03-28 LAB — LIPASE, BLOOD: LIPASE: 33 U/L (ref 11–51)

## 2017-03-28 MED ORDER — MORPHINE SULFATE (PF) 2 MG/ML IV SOLN
2.0000 mg | Freq: Once | INTRAVENOUS | Status: AC
  Administered 2017-03-28: 2 mg via INTRAVENOUS
  Filled 2017-03-28: qty 1

## 2017-03-28 MED ORDER — ONDANSETRON HCL 4 MG/2ML IJ SOLN
4.0000 mg | Freq: Once | INTRAMUSCULAR | Status: AC
  Administered 2017-03-28: 4 mg via INTRAVENOUS
  Filled 2017-03-28: qty 2

## 2017-03-28 MED ORDER — PROMETHAZINE HCL 25 MG PO TABS: 25.0000 mg | ORAL_TABLET | Freq: Four times a day (QID) | ORAL | 0 refills | Status: DC | PRN

## 2017-03-28 MED ORDER — SODIUM CHLORIDE 0.9 % IV BOLUS (SEPSIS)
1000.0000 mL | Freq: Once | INTRAVENOUS | Status: AC
  Administered 2017-03-28: 1000 mL via INTRAVENOUS

## 2017-03-28 NOTE — ED Notes (Signed)
ED Provider at bedside. 

## 2017-03-28 NOTE — ED Triage Notes (Signed)
Per EMS, pt from home complains of abd pain/n/v since last night. Pt has hx of pancreatitis. Pt had gallbladder removed 3 months ago. Pt states pain feels like pancreatitis flare up. Pt states he has only been drinking ensure, no alcohol. Pt also reports dark tarry stools, pt has hx of gastric ulcers. Pt A&Ox4.

## 2017-03-28 NOTE — ED Notes (Signed)
Patient requesting pain medication-PA made aware. 

## 2017-03-28 NOTE — ED Notes (Signed)
Patient provided urinal and made aware urine sample is needed. 

## 2017-03-28 NOTE — ED Provider Notes (Signed)
[facility]-EMERGENCY DEPT Provider Note   CSN: [id number] Arrival date & time: 03/28/17  [id number]     History   Chief Complaint Chief Complaint  Patient presents with  . Abdominal Pain    HPI [name] is a 56 y.o. male with a history of chronic pancreatitis, melena, and s/p cholecystectomy 6/18 who presents to the emergency department with a chief complaint of constant, worsening right upper quadrant and epigastric abdominal pain with associated non-bloody, non-bilious emesis since last night. He states that his symptoms feel similar to when he has had pancreatitis flare-ups in the past. He also reports 1 episode of black, tarry stool last night that has since resolved. He denies dysuria, back pain, fever, or chills. No treatment prior to arrival.  He denies drinking alcohol. He states that he only drinks Ensure, but has been trying to stay hydrated. He is followed by [facility].  The history is provided by the patient. No language interpreter was used.    Past Medical History:  Diagnosis Date  . Chronic pancreatitis (HCC)   . Depression   . Duodenitis    by EGD 1/07  . Emphysema    unclear diagnosis noted in previous EMR  . Heme positive stool    neg colonoscopies 11/2008 and 01/2008  . History of alcohol abuse   . Hyperlipidemia   . Psoriasis    extensive, responds to strong steroids, unable to start molecular therapies due to cost  . RUE weakness    2/2 cervical spondylosis  . Smoking   . Stomach ulcer     Patient Active Problem List   Diagnosis Date Noted  . Chronic cholecystitis with calculus s/p lap cholecystectomy 11/28/2016 11/28/2016  . Alcoholic fatty liver 11/28/2016  . Psoriasis   . Duodenitis   . HYPERKALEMIA 02/20/2010  . PSORIASIS 02/16/2009  . Abdominal pain 11/22/2008  . Chronic pancreatitis (HCC) 04/13/2008  . EROSIVE GASTRITIS 02/24/2008  . BLOOD IN STOOL, OCCULT 01/18/2008  . MELENA, HX OF 11/26/2007  . Dyslipidemia 08/13/2007  . TOBACCO ABUSE  04/23/2007    Past Surgical History:  Procedure Laterality Date  . ESOPHAGOGASTRODUODENOSCOPY (EGD) WITH PROPOFOL N/A 12/04/2016   Procedure: ESOPHAGOGASTRODUODENOSCOPY (EGD) WITH PROPOFOL;  Surgeon: Kathi Der, MD;  Location: [facility];  Service: Gastroenterology;  Laterality: N/A;  . LAPAROSCOPIC CHOLECYSTECTOMY  11/28/2016   w/IOC  . LAPAROSCOPIC CHOLECYSTECTOMY SINGLE SITE WITH INTRAOPERATIVE CHOLANGIOGRAM N/A 11/28/2016   Procedure: LAPAROSCOPIC CHOLECYSTECTOMY SINGLE SITE WITH INTRAOPERATIVE CHOLANGIOGRAM;  Surgeon: Karie Soda, MD;  Location: [facility];  Service: General;  Laterality: N/A;  . LIVER BIOPSY N/A 11/28/2016   Procedure: NEEDLE CORE LIVER BIOPSY;  Surgeon: Karie Soda, MD;  Location: [facility];  Service: General;  Laterality: N/A;  . TYMPANOSTOMY TUBE PLACEMENT Bilateral ~ 1970       Home Medications    Prior to Admission medications   Medication Sig Start Date End Date Taking? Authorizing Provider  atorvastatin (LIPITOR) 40 MG tablet Take 1 tablet (40 mg total) by mouth daily. Patient taking differently: Take 40 mg by mouth at bedtime.  10/15/16 03/28/17 Yes Pricilla Riffle, MD  ondansetron (ZOFRAN) 4 MG tablet Take 1 tablet (4 mg total) by mouth every 8 (eight) hours as needed for nausea. 11/28/16  Yes Karie Soda, MD  oxyCODONE-acetaminophen (PERCOCET) 7.5-325 MG tablet Take 1 tablet by mouth every 6 (six) hours as needed for moderate pain. 12/06/16  Yes Meuth, Brooke A, PA-C  pantoprazole (PROTONIX) 40 MG tablet Take 1 tablet (40 mg  total) by mouth 2 (two) times daily. 12/06/16  Yes Meuth, Brooke A, PA-C  promethazine (PHENERGAN) 25 MG tablet Take 1 tablet (25 mg total) by mouth every 6 (six) hours as needed for nausea or vomiting. 03/28/17   Lakesa Coste A, PA-C    Family History Family History  Problem Relation Age of Onset  . Diabetes Mother   . Heart disease Father   . Cancer Father        ?kidney or liver cancer    Social History Social History    Substance Use Topics  . Smoking status: Current Every Day Smoker    Packs/day: 1.00    Years: 40.00    Types: Cigarettes  . Smokeless tobacco: Never Used  . Alcohol use Yes     Comment: 12/03/2016 "nothing since 2008"     Allergies   Nsaids   Review of Systems Review of Systems  Constitutional: Negative for activity change, chills and fever.  Respiratory: Negative for shortness of breath.   Cardiovascular: Negative for chest pain.  Gastrointestinal: Positive for abdominal pain, nausea and vomiting. Negative for constipation and diarrhea.  Genitourinary: Negative for dysuria, frequency and urgency.  Musculoskeletal: Negative for back pain.  Skin: Negative for rash.   Physical Exam Updated Vital Signs BP 106/84 (BP Location: Right Arm)   Pulse 88   Temp 97.7 F (36.5 C) (Oral)   Resp 16   SpO2 95%   Physical Exam  Constitutional: He appears well-developed.  HENT:  Head: Normocephalic.  Eyes: Conjunctivae are normal.  Neck: Neck supple.  Cardiovascular: Normal rate, regular rhythm, normal heart sounds and intact distal pulses.  Exam reveals no gallop and no friction rub.   No murmur heard. Pulmonary/Chest: Effort normal and breath sounds normal. No respiratory distress. He has no wheezes. He has no rales.  Abdominal: Soft. Bowel sounds are normal. He exhibits no distension and no mass. There is tenderness. There is no rebound and no guarding. No hernia.  Tenderness to palpation in the epigastric and right upper quadrant. No rebound or guarding. No distension. No RLQ or left-sided tenderness.   Musculoskeletal:  No tenderness to palpation over the cervical, thoracic, or lumbar spinous tenderness.  Neurological: He is alert.  Skin: Skin is warm and dry.  Psychiatric: His behavior is normal.  Nursing note and vitals reviewed.    ED Treatments / Results  Labs (all labs ordered are listed, but only abnormal results are displayed) Labs Reviewed  COMPREHENSIVE  METABOLIC PANEL - Abnormal; Notable for the following:       Result Value   CO2 21 (*)    ALT 10 (*)    All other components within normal limits  LIPASE, BLOOD  CBC  URINALYSIS, ROUTINE W REFLEX MICROSCOPIC  POC OCCULT BLOOD, ED    EKG  EKG Interpretation None       Radiology No results found.  Procedures Procedures (including critical care time)  Medications Ordered in ED Medications  sodium chloride 0.9 % bolus 1,000 mL (0 mLs Intravenous Stopped 03/28/17 1548)  ondansetron (ZOFRAN) injection 4 mg (4 mg Intravenous Given 03/28/17 1228)  morphine 2 MG/ML injection 2 mg (2 mg Intravenous Given 03/28/17 1228)  morphine 2 MG/ML injection 2 mg (2 mg Intravenous Given 03/28/17 1449)     Initial Impression / Assessment and Plan / ED Course  I have reviewed the triage vital signs and the nursing notes.  Pertinent labs & imaging results that were available during my care of the  patient were reviewed by me and considered in my medical decision making (see chart for details).     56 year old male with history of chronic pancreatitis presenting with abdominal pain, nausea, and vomiting x1 day. CBC unremarkable. Lipase is not elevated. No elevation of alkaline phosphatase or transaminases. Electrolytes are unremarkable. Hemoccult negative. Pain controlled in the ED. Nausea improved with Zofran. Vital signs stable. The patient was successfully fluid challenge. Will discharge the patient home with Phenergan and follow up with his PCP. Discussed the plan with the patient who is agreeable at this time. Strict return precautions given. No acute distress. The patient is safe for discharge at this time.  Final Clinical Impressions(s) / ED Diagnoses   Final diagnoses:  Epigastric pain    New Prescriptions Discharge Medication List as of 03/28/2017  3:28 PM    START taking these medications   Details  promethazine (PHENERGAN) 25 MG tablet Take 1 tablet (25 mg total) by mouth every 6  (six) hours as needed for nausea or vomiting., Starting Fri 03/28/2017, Print         Edilberto Roosevelt A, PA-C 03/28/17 1719    Lorre Nick, MD 03/31/17 [phone]

## 2017-03-28 NOTE — ED Notes (Signed)
Main lab asked to attempt to stick patient.

## 2017-03-28 NOTE — Discharge Instructions (Signed)
Take one tablet of phenergan every 6 hours as needed for nausea and vomiting. You can take Tylenol, 650 mg, once every 6 hours for pain.  If you develop new or worsening symptoms, including a severe fever that does not improve with Tylenol, severe abdominal pain or the inability to keep down fluids, please return to the emergency department for re-evaluation.

## 2017-03-28 NOTE — ED Notes (Signed)
Main lab at bedside 

## 2017-05-17 ENCOUNTER — Emergency Department (HOSPITAL_COMMUNITY)
Admission: EM | Admit: 2017-05-17 | Discharge: 2017-05-17 | Disposition: A | Discharge status: Home / Self Care | Payer: Medicaid Other | Attending: Emergency Medicine | Admitting: Emergency Medicine

## 2017-05-17 ENCOUNTER — Encounter (HOSPITAL_COMMUNITY): Payer: Self-pay | Admitting: Emergency Medicine

## 2017-05-17 ENCOUNTER — Other Ambulatory Visit: Payer: Self-pay

## 2017-05-17 DIAGNOSIS — Z79899 Other long term (current) drug therapy: Secondary | ICD-10-CM | POA: Diagnosis not present

## 2017-05-17 DIAGNOSIS — E785 Hyperlipidemia, unspecified: Secondary | ICD-10-CM | POA: Insufficient documentation

## 2017-05-17 DIAGNOSIS — F17210 Nicotine dependence, cigarettes, uncomplicated: Secondary | ICD-10-CM | POA: Insufficient documentation

## 2017-05-17 DIAGNOSIS — R1010 Upper abdominal pain, unspecified: Secondary | ICD-10-CM

## 2017-05-17 LAB — CBC WITH DIFFERENTIAL/PLATELET
BASOS ABS: 0 10*3/uL (ref 0.0–0.1)
BASOS PCT: 0 %
EOS ABS: 0.2 10*3/uL (ref 0.0–0.7)
EOS PCT: 3 %
HCT: 41.9 % (ref 39.0–52.0)
HEMOGLOBIN: 13.9 g/dL (ref 13.0–17.0)
Lymphocytes Relative: 34 %
Lymphs Abs: 2.8 10*3/uL (ref 0.7–4.0)
MCH: 29.7 pg (ref 26.0–34.0)
MCHC: 33.2 g/dL (ref 30.0–36.0)
MCV: 89.5 fL (ref 78.0–100.0)
Monocytes Absolute: 0.4 10*3/uL (ref 0.1–1.0)
Monocytes Relative: 5 %
NEUTROS PCT: 58 %
Neutro Abs: 4.8 10*3/uL (ref 1.7–7.7)
PLATELETS: 170 10*3/uL (ref 150–400)
RBC: 4.68 MIL/uL (ref 4.22–5.81)
RDW: 15.3 % (ref 11.5–15.5)
WBC: 8.3 10*3/uL (ref 4.0–10.5)

## 2017-05-17 LAB — COMPREHENSIVE METABOLIC PANEL
ALBUMIN: 3.9 g/dL (ref 3.5–5.0)
ALK PHOS: 83 U/L (ref 38–126)
ALT: 17 U/L (ref 17–63)
AST: 34 U/L (ref 15–41)
Anion gap: 11 (ref 5–15)
BUN: 9 mg/dL (ref 6–20)
CALCIUM: 9.2 mg/dL (ref 8.9–10.3)
CHLORIDE: 108 mmol/L (ref 101–111)
CO2: 19 mmol/L — AB (ref 22–32)
CREATININE: 0.76 mg/dL (ref 0.61–1.24)
GFR calc non Af Amer: >60 mL/min (ref >60)
GLUCOSE: 112 mg/dL — AB (ref 65–99)
Potassium: 3.3 mmol/L — ABNORMAL LOW (ref 3.5–5.1)
SODIUM: 138 mmol/L (ref 135–145)
Total Bilirubin: 0.6 mg/dL (ref 0.3–1.2)
Total Protein: 6.5 g/dL (ref 6.5–8.1)

## 2017-05-17 LAB — LIPASE, BLOOD: Lipase: 32 U/L (ref 11–51)

## 2017-05-17 MED ORDER — ONDANSETRON HCL 4 MG/2ML IJ SOLN
4.0000 mg | Freq: Once | INTRAMUSCULAR | Status: AC
  Administered 2017-05-17: 4 mg via INTRAVENOUS
  Filled 2017-05-17: qty 2

## 2017-05-17 MED ORDER — HYDROMORPHONE HCL 1 MG/ML IJ SOLN
1.0000 mg | Freq: Once | INTRAMUSCULAR | Status: AC
  Administered 2017-05-17: 1 mg via INTRAVENOUS
  Filled 2017-05-17: qty 1

## 2017-05-17 MED ORDER — KETOROLAC TROMETHAMINE 60 MG/2ML IM SOLN
60.0000 mg | Freq: Once | INTRAMUSCULAR | Status: AC
  Administered 2017-05-17: 60 mg via INTRAMUSCULAR
  Filled 2017-05-17: qty 2

## 2017-05-17 MED ORDER — HYDROCODONE-ACETAMINOPHEN 5-325 MG PO TABS: 1.0000 | ORAL_TABLET | ORAL | 0 refills | Status: DC | PRN

## 2017-05-17 MED ORDER — SODIUM CHLORIDE 0.9 % IV BOLUS (SEPSIS)
1000.0000 mL | Freq: Once | INTRAVENOUS | Status: AC
  Administered 2017-05-17: 1000 mL via INTRAVENOUS

## 2017-05-17 NOTE — ED Notes (Signed)
Declined W/C at D/C and was escorted to lobby by RN. 

## 2017-05-17 NOTE — ED Triage Notes (Signed)
Patient arrives from home with complaint of pancreatitis pain exacerbation for 1 day. States history of the same since 1992. Explains that pain is never completely gone, but once every month or two it gets terrible. Currently pain is severe. Endorses nausea and diarrhea. Denies ETOH use.

## 2017-05-17 NOTE — ED Provider Notes (Signed)
[facility] EMERGENCY [facility]MENT Provider Note   CSN: [id number] Arrival date & time: 05/17/17  [id number]     History   Chief Complaint Chief Complaint  Patient presents with  . Abdominal Pain    HPI [name] is a 56 y.o. male.  Patient presents to the ER for evaluation of abdominal pain.  Patient has a history of chronic pancreatitis.  He reports a history of heavy alcohol use years ago, has not had anything to drink for 10 years.  He has constant pain, but occasionally has significant flares.  Patient reports for the last 1 day he has been having severe and constant upper abdominal pain.  He has had associated nausea but no vomiting.  He has had diarrhea.      Past Medical History:  Diagnosis Date  . Chronic pancreatitis (HCC)   . Depression   . Duodenitis    by EGD 1/07  . Emphysema    unclear diagnosis noted in previous EMR  . Heme positive stool    neg colonoscopies 11/2008 and 01/2008  . History of alcohol abuse   . Hyperlipidemia   . Psoriasis    extensive, responds to strong steroids, unable to start molecular therapies due to cost  . RUE weakness    2/2 cervical spondylosis  . Smoking   . Stomach ulcer     Patient Active Problem List   Diagnosis Date Noted  . Chronic cholecystitis with calculus s/p lap cholecystectomy 11/28/2016 11/28/2016  . Alcoholic fatty liver 11/28/2016  . Psoriasis   . Duodenitis   . HYPERKALEMIA 02/20/2010  . PSORIASIS 02/16/2009  . Abdominal pain 11/22/2008  . Chronic pancreatitis (HCC) 04/13/2008  . EROSIVE GASTRITIS 02/24/2008  . BLOOD IN STOOL, OCCULT 01/18/2008  . MELENA, HX OF 11/26/2007  . Dyslipidemia 08/13/2007  . TOBACCO ABUSE 04/23/2007    Past Surgical History:  Procedure Laterality Date  . ESOPHAGOGASTRODUODENOSCOPY (EGD) WITH PROPOFOL N/A 12/04/2016   Procedure: ESOPHAGOGASTRODUODENOSCOPY (EGD) WITH PROPOFOL;  Surgeon: Kathi DerBrahmbhatt, Parag, MD;  Location: [facility];  Service:  Gastroenterology;  Laterality: N/A;  . LAPAROSCOPIC CHOLECYSTECTOMY  11/28/2016   w/IOC  . LAPAROSCOPIC CHOLECYSTECTOMY SINGLE SITE WITH INTRAOPERATIVE CHOLANGIOGRAM N/A 11/28/2016   Procedure: LAPAROSCOPIC CHOLECYSTECTOMY SINGLE SITE WITH INTRAOPERATIVE CHOLANGIOGRAM;  Surgeon: Karie SodaGross, Steven, MD;  Location: [facility];  Service: General;  Laterality: N/A;  . LIVER BIOPSY N/A 11/28/2016   Procedure: NEEDLE CORE LIVER BIOPSY;  Surgeon: Karie SodaGross, Steven, MD;  Location: [facility];  Service: General;  Laterality: N/A;  . TYMPANOSTOMY TUBE PLACEMENT Bilateral ~ 1970       Home Medications    Prior to Admission medications   Medication Sig Start Date End Date Taking? Authorizing Provider  atorvastatin (LIPITOR) 40 MG tablet Take 1 tablet (40 mg total) by mouth daily. Patient taking differently: Take 40 mg by mouth at bedtime.  10/15/16 03/28/17  Pricilla Riffleoss, Paula V, MD  HYDROcodone-acetaminophen (NORCO/VICODIN) 5-325 MG tablet Take 1-2 tablets by mouth every 4 (four) hours as needed for moderate pain. 05/17/17   Gilda CreasePollina, Aireal Slater J, MD  ondansetron (ZOFRAN) 4 MG tablet Take 1 tablet (4 mg total) by mouth every 8 (eight) hours as needed for nausea. 11/28/16   Karie SodaGross, Steven, MD  oxyCODONE-acetaminophen (PERCOCET) 7.5-325 MG tablet Take 1 tablet by mouth every 6 (six) hours as needed for moderate pain. 12/06/16   Meuth, Brooke A, PA-C  pantoprazole (PROTONIX) 40 MG tablet Take 1 tablet (40 mg total) by mouth 2 (two) times daily. 12/06/16  Meuth, Brooke A, PA-C  promethazine (PHENERGAN) 25 MG tablet Take 1 tablet (25 mg total) by mouth every 6 (six) hours as needed for nausea or vomiting. 03/28/17   McDonald, Mia A, PA-C    Family History Family History  Problem Relation Age of Onset  . Diabetes Mother   . Heart disease Father   . Cancer Father        ?kidney or liver cancer    Social History Social History   Tobacco Use  . Smoking status: Current Every Day Smoker    Packs/day: 1.00    Years: 40.00    Pack  years: 40.00    Types: Cigarettes  . Smokeless tobacco: Never Used  Substance Use Topics  . Alcohol use: Yes    Comment: 12/03/2016 "nothing since 2008"  . Drug use: No    Comment: quit in high school     Allergies   Nsaids   Review of Systems Review of Systems  Gastrointestinal: Positive for abdominal pain, diarrhea and nausea.  All other systems reviewed and are negative.    Physical Exam Updated Vital Signs BP 106/67   Pulse 87   Resp 16   SpO2 96%   Physical Exam  Constitutional: He is oriented to person, place, and time. He appears well-developed and well-nourished. No distress.  HENT:  Head: Normocephalic and atraumatic.  Right Ear: Hearing normal.  Left Ear: Hearing normal.  Nose: Nose normal.  Mouth/Throat: Oropharynx is clear and moist and mucous membranes are normal.  Eyes: Conjunctivae and EOM are normal. Pupils are equal, round, and reactive to light.  Neck: Normal range of motion. Neck supple.  Cardiovascular: Regular rhythm, S1 normal and S2 normal. Exam reveals no gallop and no friction rub.  No murmur heard. Pulmonary/Chest: Effort normal and breath sounds normal. No respiratory distress. He exhibits no tenderness.  Abdominal: Soft. Normal appearance and bowel sounds are normal. There is no hepatosplenomegaly. There is tenderness in the right upper quadrant, epigastric area and left upper quadrant. There is no rebound, no guarding, no tenderness at McBurney's point and negative Murphy's sign. No hernia.  Musculoskeletal: Normal range of motion.  Neurological: He is alert and oriented to person, place, and time. He has normal strength. No cranial nerve deficit or sensory deficit. Coordination normal. GCS eye subscore is 4. GCS verbal subscore is 5. GCS motor subscore is 6.  Skin: Skin is warm, dry and intact. No rash noted. No cyanosis.  Psychiatric: He has a normal mood and affect. His speech is normal and behavior is normal. Thought content normal.    Nursing note and vitals reviewed.    ED Treatments / Results  Labs (all labs ordered are listed, but only abnormal results are displayed) Labs Reviewed  COMPREHENSIVE METABOLIC PANEL - Abnormal; Notable for the following components:      Result Value   Potassium 3.3 (*)    CO2 19 (*)    Glucose, Bld 112 (*)    All other components within normal limits  CBC WITH DIFFERENTIAL/PLATELET  LIPASE, BLOOD    EKG  EKG Interpretation None       Radiology No results found.  Procedures Procedures (including critical care time)  Medications Ordered in ED Medications  sodium chloride 0.9 % bolus 1,000 mL (1,000 mLs Intravenous New Bag/Given 05/17/17 0514)  HYDROmorphone (DILAUDID) injection 1 mg (1 mg Intravenous Given 05/17/17 0514)  ondansetron (ZOFRAN) injection 4 mg (4 mg Intravenous Given 05/17/17 0515)  ketorolac (TORADOL) injection 60 mg (  60 mg Intramuscular Given 05/17/17 0515)     Initial Impression / Assessment and Plan / ED Course  I have reviewed the triage vital signs and the nursing notes.  Pertinent labs & imaging results that were available during my care of the patient were reviewed by me and considered in my medical decision making (see chart for details).     Patient presents to the emergency department for evaluation of abdominal pain.  Patient has a history of chronic pancreatitis.  He is complaining of diffuse upper abdominal pain, similar to previous episodes.  Patient does not appear toxic in any way.  No hypotension or tachycardia currently.  Lab work was unremarkable, no elevated white blood cell count, no anemia.  Lipase is normal.  Suspect this is exacerbation of chronic pain.  Treat with analgesia.  Patient does have a history of chronic pain with outpatient narcotic analgesia, but it appears he has been cut off.  Checking the [place] database reveals he has had 6 prescriptions since June of this year all for short courses of 4 or 5 days at a time.  He has  not had a prescription in 1 month.  We will give him a very limited supply of analgesia.  Final Clinical Impressions(s) / ED Diagnoses   Final diagnoses:  Pain of upper abdomen    ED Discharge Orders        Ordered    HYDROcodone-acetaminophen (NORCO/VICODIN) 5-325 MG tablet  Every 4 hours PRN     05/17/17 0629       Gilda CreasePollina, Corney Knighton J, MD 05/17/17 [phone]

## 2017-05-19 ENCOUNTER — Other Ambulatory Visit: Payer: Self-pay

## 2017-05-19 ENCOUNTER — Inpatient Hospital Stay (HOSPITAL_COMMUNITY): Payer: Medicaid Other

## 2017-05-19 ENCOUNTER — Emergency Department (HOSPITAL_COMMUNITY): Payer: Medicaid Other

## 2017-05-19 ENCOUNTER — Encounter (HOSPITAL_COMMUNITY): Payer: Self-pay | Admitting: Emergency Medicine

## 2017-05-19 ENCOUNTER — Inpatient Hospital Stay (HOSPITAL_COMMUNITY)
Admission: EM | Admit: 2017-05-19 | Discharge: 2017-05-24 | DRG: 199 | Disposition: A | Discharge status: Home / Self Care | Payer: Medicaid Other | Attending: Internal Medicine | Admitting: Internal Medicine

## 2017-05-19 DIAGNOSIS — J9311 Primary spontaneous pneumothorax: Secondary | ICD-10-CM | POA: Diagnosis present

## 2017-05-19 DIAGNOSIS — J9383 Other pneumothorax: Secondary | ICD-10-CM

## 2017-05-19 DIAGNOSIS — J9811 Atelectasis: Secondary | ICD-10-CM | POA: Diagnosis not present

## 2017-05-19 DIAGNOSIS — Z8249 Family history of ischemic heart disease and other diseases of the circulatory system: Secondary | ICD-10-CM

## 2017-05-19 DIAGNOSIS — J431 Panlobular emphysema: Secondary | ICD-10-CM | POA: Diagnosis not present

## 2017-05-19 DIAGNOSIS — J948 Other specified pleural conditions: Secondary | ICD-10-CM | POA: Diagnosis not present

## 2017-05-19 DIAGNOSIS — Z9689 Presence of other specified functional implants: Secondary | ICD-10-CM | POA: Diagnosis not present

## 2017-05-19 DIAGNOSIS — K860 Alcohol-induced chronic pancreatitis: Secondary | ICD-10-CM | POA: Diagnosis present

## 2017-05-19 DIAGNOSIS — Z720 Tobacco use: Secondary | ICD-10-CM

## 2017-05-19 DIAGNOSIS — Z4682 Encounter for fitting and adjustment of non-vascular catheter: Secondary | ICD-10-CM | POA: Diagnosis not present

## 2017-05-19 DIAGNOSIS — E785 Hyperlipidemia, unspecified: Secondary | ICD-10-CM | POA: Diagnosis present

## 2017-05-19 DIAGNOSIS — J939 Pneumothorax, unspecified: Secondary | ICD-10-CM

## 2017-05-19 DIAGNOSIS — J439 Emphysema, unspecified: Secondary | ICD-10-CM | POA: Diagnosis present

## 2017-05-19 DIAGNOSIS — J9601 Acute respiratory failure with hypoxia: Secondary | ICD-10-CM | POA: Diagnosis present

## 2017-05-19 DIAGNOSIS — G8929 Other chronic pain: Secondary | ICD-10-CM

## 2017-05-19 DIAGNOSIS — K861 Other chronic pancreatitis: Secondary | ICD-10-CM

## 2017-05-19 DIAGNOSIS — F17210 Nicotine dependence, cigarettes, uncomplicated: Secondary | ICD-10-CM | POA: Diagnosis present

## 2017-05-19 DIAGNOSIS — J9621 Acute and chronic respiratory failure with hypoxia: Secondary | ICD-10-CM | POA: Insufficient documentation

## 2017-05-19 DIAGNOSIS — Z833 Family history of diabetes mellitus: Secondary | ICD-10-CM | POA: Diagnosis not present

## 2017-05-19 DIAGNOSIS — E876 Hypokalemia: Secondary | ICD-10-CM | POA: Diagnosis present

## 2017-05-19 DIAGNOSIS — Z8709 Personal history of other diseases of the respiratory system: Secondary | ICD-10-CM | POA: Diagnosis present

## 2017-05-19 DIAGNOSIS — R0602 Shortness of breath: Secondary | ICD-10-CM | POA: Diagnosis not present

## 2017-05-19 DIAGNOSIS — Z938 Other artificial opening status: Secondary | ICD-10-CM | POA: Diagnosis not present

## 2017-05-19 DIAGNOSIS — R109 Unspecified abdominal pain: Secondary | ICD-10-CM

## 2017-05-19 DIAGNOSIS — J449 Chronic obstructive pulmonary disease, unspecified: Secondary | ICD-10-CM | POA: Diagnosis present

## 2017-05-19 LAB — URINALYSIS, ROUTINE W REFLEX MICROSCOPIC
BACTERIA UA: NONE SEEN
Glucose, UA: NEGATIVE mg/dL
Hgb urine dipstick: NEGATIVE
Ketones, ur: 80 mg/dL — AB
Leukocytes, UA: NEGATIVE
Nitrite: NEGATIVE
PROTEIN: 30 mg/dL — AB
SPECIFIC GRAVITY, URINE: 1.032 — AB (ref 1.005–1.030)
SQUAMOUS EPITHELIAL / LPF: NONE SEEN
pH: 5 (ref 5.0–8.0)

## 2017-05-19 LAB — COMPREHENSIVE METABOLIC PANEL
ALBUMIN: 4.1 g/dL (ref 3.5–5.0)
ALT: 16 U/L — ABNORMAL LOW (ref 17–63)
AST: 30 U/L (ref 15–41)
Alkaline Phosphatase: 87 U/L (ref 38–126)
Anion gap: 15 (ref 5–15)
BUN: 15 mg/dL (ref 6–20)
CHLORIDE: 108 mmol/L (ref 101–111)
CO2: 16 mmol/L — AB (ref 22–32)
Calcium: 9.1 mg/dL (ref 8.9–10.3)
Creatinine, Ser: 0.79 mg/dL (ref 0.61–1.24)
GFR calc Af Amer: >60 mL/min (ref >60)
GFR calc non Af Amer: >60 mL/min (ref >60)
GLUCOSE: 165 mg/dL — AB (ref 65–99)
POTASSIUM: 3.4 mmol/L — AB (ref 3.5–5.1)
SODIUM: 139 mmol/L (ref 135–145)
Total Bilirubin: 0.7 mg/dL (ref 0.3–1.2)
Total Protein: 7.2 g/dL (ref 6.5–8.1)

## 2017-05-19 LAB — CBC
HCT: 43 % (ref 39.0–52.0)
Hemoglobin: 14.8 g/dL (ref 13.0–17.0)
MCH: 30.8 pg (ref 26.0–34.0)
MCHC: 34.4 g/dL (ref 30.0–36.0)
MCV: 89.4 fL (ref 78.0–100.0)
Platelets: 189 10*3/uL (ref 150–400)
RBC: 4.81 MIL/uL (ref 4.22–5.81)
RDW: 15.2 % (ref 11.5–15.5)
WBC: 12 10*3/uL — ABNORMAL HIGH (ref 4.0–10.5)

## 2017-05-19 LAB — RAPID URINE DRUG SCREEN, HOSP PERFORMED
Amphetamines: NOT DETECTED
BARBITURATES: NOT DETECTED
Benzodiazepines: POSITIVE — AB
COCAINE: NOT DETECTED
Opiates: POSITIVE — AB
Tetrahydrocannabinol: NOT DETECTED

## 2017-05-19 LAB — LIPASE, BLOOD: LIPASE: 20 U/L (ref 11–51)

## 2017-05-19 MED ORDER — SENNOSIDES-DOCUSATE SODIUM 8.6-50 MG PO TABS
1.0000 | ORAL_TABLET | Freq: Every evening | ORAL | Status: DC | PRN
  Administered 2017-05-20 – 2017-05-22 (×2): 1 via ORAL
  Filled 2017-05-19 (×2): qty 1

## 2017-05-19 MED ORDER — HYDROCODONE-ACETAMINOPHEN 5-325 MG PO TABS: 1.0000 | ORAL_TABLET | ORAL | Status: DC | PRN

## 2017-05-19 MED ORDER — ONDANSETRON HCL 4 MG PO TABS: 4.0000 mg | ORAL_TABLET | Freq: Four times a day (QID) | ORAL | Status: DC | PRN

## 2017-05-19 MED ORDER — LIDOCAINE HCL 1 % IJ SOLN: 20.0000 mL | Freq: Once | INTRAMUSCULAR | Status: DC

## 2017-05-19 MED ORDER — SODIUM CHLORIDE 0.9 % IV SOLN: INTRAVENOUS | Status: DC

## 2017-05-19 MED ORDER — NICOTINE 14 MG/24HR TD PT24
14.0000 mg | MEDICATED_PATCH | Freq: Every day | TRANSDERMAL | Status: DC
  Administered 2017-05-19 – 2017-05-24 (×6): 14 mg via TRANSDERMAL
  Filled 2017-05-19 (×6): qty 1

## 2017-05-19 MED ORDER — MIDAZOLAM HCL 2 MG/2ML IJ SOLN
2.0000 mg | Freq: Once | INTRAMUSCULAR | Status: AC
  Administered 2017-05-19: 2 mg via INTRAVENOUS
  Filled 2017-05-19: qty 2

## 2017-05-19 MED ORDER — OXYCODONE HCL 5 MG PO TABS
10.0000 mg | ORAL_TABLET | ORAL | Status: DC | PRN
  Administered 2017-05-19 (×2): 10 mg via ORAL
  Filled 2017-05-19 (×2): qty 2

## 2017-05-19 MED ORDER — POTASSIUM CHLORIDE IN NACL 20-0.9 MEQ/L-% IV SOLN
INTRAVENOUS | Status: DC
  Administered 2017-05-19: 11:00:00 via INTRAVENOUS
  Administered 2017-05-20: 75 mL/h via INTRAVENOUS
  Administered 2017-05-20 – 2017-05-23 (×5): via INTRAVENOUS
  Filled 2017-05-19 (×10): qty 1000

## 2017-05-19 MED ORDER — ACETAMINOPHEN 650 MG RE SUPP: 650.0000 mg | Freq: Four times a day (QID) | RECTAL | Status: DC | PRN

## 2017-05-19 MED ORDER — ACETAMINOPHEN 325 MG PO TABS: 650.0000 mg | ORAL_TABLET | Freq: Four times a day (QID) | ORAL | Status: DC | PRN

## 2017-05-19 MED ORDER — ONDANSETRON HCL 4 MG/2ML IJ SOLN
4.0000 mg | Freq: Once | INTRAMUSCULAR | Status: AC
  Administered 2017-05-19: 4 mg via INTRAVENOUS

## 2017-05-19 MED ORDER — IPRATROPIUM-ALBUTEROL 0.5-2.5 (3) MG/3ML IN SOLN
3.0000 mL | Freq: Once | RESPIRATORY_TRACT | Status: AC
  Administered 2017-05-19: 3 mL via RESPIRATORY_TRACT
  Filled 2017-05-19: qty 3

## 2017-05-19 MED ORDER — IPRATROPIUM-ALBUTEROL 0.5-2.5 (3) MG/3ML IN SOLN
3.0000 mL | RESPIRATORY_TRACT | Status: DC
Start: 2017-05-19 — End: 2017-05-19
  Administered 2017-05-19: 3 mL via RESPIRATORY_TRACT
  Filled 2017-05-19 (×2): qty 3

## 2017-05-19 MED ORDER — PANTOPRAZOLE SODIUM 40 MG PO TBEC
40.0000 mg | DELAYED_RELEASE_TABLET | Freq: Every day | ORAL | Status: DC
  Administered 2017-05-19 – 2017-05-24 (×6): 40 mg via ORAL
  Filled 2017-05-19 (×6): qty 1

## 2017-05-19 MED ORDER — ONDANSETRON HCL 4 MG/2ML IJ SOLN
4.0000 mg | Freq: Once | INTRAMUSCULAR | Status: AC
  Administered 2017-05-19: 4 mg via INTRAVENOUS
  Filled 2017-05-19: qty 2

## 2017-05-19 MED ORDER — IPRATROPIUM-ALBUTEROL 0.5-2.5 (3) MG/3ML IN SOLN
3.0000 mL | Freq: Four times a day (QID) | RESPIRATORY_TRACT | Status: DC
  Administered 2017-05-19 – 2017-05-22 (×13): 3 mL via RESPIRATORY_TRACT
  Filled 2017-05-19 (×13): qty 3

## 2017-05-19 MED ORDER — LIDOCAINE HCL (PF) 2 % IJ SOLN
INTRAMUSCULAR | Status: AC
  Administered 2017-05-19: 10 mL
  Filled 2017-05-19: qty 10

## 2017-05-19 MED ORDER — KETOROLAC TROMETHAMINE 30 MG/ML IJ SOLN
30.0000 mg | Freq: Once | INTRAMUSCULAR | Status: AC
  Administered 2017-05-19: 30 mg via INTRAVENOUS
  Filled 2017-05-19: qty 1

## 2017-05-19 MED ORDER — ONDANSETRON HCL 4 MG/2ML IJ SOLN
4.0000 mg | Freq: Four times a day (QID) | INTRAMUSCULAR | Status: DC | PRN
  Administered 2017-05-19: 4 mg via INTRAVENOUS
  Filled 2017-05-19 (×2): qty 2

## 2017-05-19 MED ORDER — MORPHINE SULFATE (PF) 2 MG/ML IV SOLN
2.0000 mg | Freq: Once | INTRAVENOUS | Status: AC
  Administered 2017-05-19: 2 mg via INTRAVENOUS

## 2017-05-19 MED ORDER — ATORVASTATIN CALCIUM 40 MG PO TABS
40.0000 mg | ORAL_TABLET | Freq: Every day | ORAL | Status: DC
  Administered 2017-05-19 – 2017-05-23 (×5): 40 mg via ORAL
  Filled 2017-05-19 (×6): qty 1

## 2017-05-19 MED ORDER — FENTANYL CITRATE (PF) 100 MCG/2ML IJ SOLN
25.0000 ug | INTRAMUSCULAR | Status: DC | PRN
  Administered 2017-05-19 – 2017-05-22 (×31): 50 ug via INTRAVENOUS
  Administered 2017-05-22: 25 ug via INTRAVENOUS
  Administered 2017-05-23 – 2017-05-24 (×13): 50 ug via INTRAVENOUS
  Filled 2017-05-19 (×49): qty 2

## 2017-05-19 MED ORDER — MORPHINE SULFATE (PF) 2 MG/ML IV SOLN
2.0000 mg | INTRAVENOUS | Status: DC | PRN
  Administered 2017-05-19 (×7): 2 mg via INTRAVENOUS
  Filled 2017-05-19 (×9): qty 1

## 2017-05-19 NOTE — H&P (Signed)
History and Physical    Samuel SaaKenneth T Hornbaker WGN:[id number] DOB: Jan 29, 1961 DOA: 05/19/2017  PCP: Renaye RakersBland, Veita, MD  Patient coming from:Home  Chief Complaint: right chest pain and SOB  HPI: [name] is a 56 y.o. male with medical history significant of COPD/emphysema, active smoker not on home oxygen, alcohol related chronic pancreatitis, has not been drinking for last 10 years, chronic abdominal pain, seen in ER 2 days ago for abdominal pain and nausea when patient was discharged with pain medications presented with worsening right-sided chest, epigastric pain associated with shortness of breath.  Patient reported that his symptoms are worsening for last 2 days to the point that he could not breathe well.  The right-sided chest pain is sharp, no radiation.  No associated with breathing or activity as per patient.  Denied fever, chills, headache, dizziness, dysuria, urgency.  He has chronic loose bowel movement and chronic cough which has not changed as per patient.  ED Course: In the ER patient was found to have hypoxia, right-sided pneumothorax.  The chest tube was placed by cardiothoracic surgeon and recommended to admit under [facility].  Patient reported some discomfort at the site of chest tube.  Review of Systems: As per HPI otherwise 10 point review of systems negative.    Past Medical History:  Diagnosis Date  . Chronic pancreatitis (HCC)   . Depression   . Duodenitis    by EGD 1/07  . Emphysema    unclear diagnosis noted in previous EMR  . Heme positive stool    neg colonoscopies 11/2008 and 01/2008  . History of alcohol abuse   . Hyperlipidemia   . Psoriasis    extensive, responds to strong steroids, unable to start molecular therapies due to cost  . RUE weakness    2/2 cervical spondylosis  . Smoking   . Stomach ulcer     Past Surgical History:  Procedure Laterality Date  . ESOPHAGOGASTRODUODENOSCOPY (EGD) WITH PROPOFOL N/A 12/04/2016   Procedure:  ESOPHAGOGASTRODUODENOSCOPY (EGD) WITH PROPOFOL;  Surgeon: Kathi DerBrahmbhatt, Parag, MD;  Location: [facility];  Service: Gastroenterology;  Laterality: N/A;  . LAPAROSCOPIC CHOLECYSTECTOMY  11/28/2016   w/IOC  . LAPAROSCOPIC CHOLECYSTECTOMY SINGLE SITE WITH INTRAOPERATIVE CHOLANGIOGRAM N/A 11/28/2016   Procedure: LAPAROSCOPIC CHOLECYSTECTOMY SINGLE SITE WITH INTRAOPERATIVE CHOLANGIOGRAM;  Surgeon: Karie SodaGross, Steven, MD;  Location: [facility];  Service: General;  Laterality: N/A;  . LIVER BIOPSY N/A 11/28/2016   Procedure: NEEDLE CORE LIVER BIOPSY;  Surgeon: Karie SodaGross, Steven, MD;  Location: [facility];  Service: General;  Laterality: N/A;  . TYMPANOSTOMY TUBE PLACEMENT Bilateral ~ 1970    Social history: Reports that he has been smoking cigarettes.  He has a 40.00 pack-year smoking history. he has never used smokeless tobacco. He reports that he drinks alcohol. He reports that he does not use drugs.  Allergies  Allergen Reactions  . Nsaids Other (See Comments)    Bleeding gastritis    Family History  Problem Relation Age of Onset  . Diabetes Mother   . Heart disease Father   . Cancer Father        ?kidney or liver cancer     Prior to Admission medications   Medication Sig Start Date End Date Taking? Authorizing Provider  HYDROcodone-acetaminophen (NORCO/VICODIN) 5-325 MG tablet Take 1-2 tablets by mouth every 4 (four) hours as needed for moderate pain. 05/17/17  Yes Pollina, Canary Brimhristopher J, MD  atorvastatin (LIPITOR) 40 MG tablet Take 1 tablet (40 mg total) by mouth daily. Patient  taking differently: Take 40 mg by mouth at bedtime.  10/15/16 03/28/17  Pricilla Riffle, MD  ondansetron (ZOFRAN) 4 MG tablet Take 1 tablet (4 mg total) by mouth every 8 (eight) hours as needed for nausea. Patient not taking: Reported on 05/19/2017 11/28/16   Karie Soda, MD  pantoprazole (PROTONIX) 40 MG tablet Take 1 tablet (40 mg total) by mouth 2 (two) times daily. Patient not taking: Reported on 05/19/2017 12/06/16   Franne Forts, PA-C  promethazine (PHENERGAN) 25 MG tablet Take 1 tablet (25 mg total) by mouth every 6 (six) hours as needed for nausea or vomiting. Patient not taking: Reported on 05/19/2017 03/28/17   Barkley Boards, PA-C    Physical Exam: Vitals:   05/19/17 0640 05/19/17 0645 05/19/17 0659 05/19/17 0746  BP: (!) 147/103 (!) 161/147 100/61 112/89  Pulse:   98 96  Resp: 18 (!) 26 (!) 27 (!) 25  Temp:   99.8 F (37.7 C)   TempSrc:   Oral   SpO2:   96% 99%  Weight:      Height:          Constitutional: NAD, calm, comfortable Vitals:   05/19/17 0640 05/19/17 0645 05/19/17 0659 05/19/17 0746  BP: (!) 147/103 (!) 161/147 100/61 112/89  Pulse:   98 96  Resp: 18 (!) 26 (!) 27 (!) 25  Temp:   99.8 F (37.7 C)   TempSrc:   Oral   SpO2:   96% 99%  Weight:      Height:       Eyes: PERRL, lids and conjunctivae normal ENMT: Mucous membranes are dry.   Neck: normal, supple Respiratory: Decreased breath sound on the right side, left side has no wheezing or crackle.  Chest tube on the right side. Cardiovascular: Regular rate and rhythm, S1S2 Normal. No extremity edema.   Abdomen: Soft, Non-tender, non-distended. Bowel sounds positive.  Musculoskeletal: no clubbing / cyanosis. No joint deformity upper and lower extremities. Skin: no rashes, lesions, ulcers. No induration Neurologic: CN 2-12 grossly intact. Sensation intact.  Strength 5/5 in all 4.  Psychiatric: Normal judgment and insight. Alert and oriented x 3. Normal mood.    Labs on Admission: I have personally reviewed following labs and imaging studies  CBC: Recent Labs  Lab 05/17/17 0440 05/19/17 0206  WBC 8.3 12.0*  NEUTROABS 4.8  --   HGB 13.9 14.8  HCT 41.9 43.0  MCV 89.5 89.4  PLT 170 189   Basic Metabolic Panel: Recent Labs  Lab 05/17/17 0440 05/19/17 0206  NA 138 139  K 3.3* 3.4*  CL 108 108  CO2 19* 16*  GLUCOSE 112* 165*  BUN 9 15  CREATININE 0.76 0.79  CALCIUM 9.2 9.1   GFR: Estimated Creatinine  Clearance: 84.7 mL/min (by C-G formula based on SCr of 0.79 mg/dL). Liver Function Tests: Recent Labs  Lab 05/17/17 0440 05/19/17 0206  AST 34 30  ALT 17 16*  ALKPHOS 83 87  BILITOT 0.6 0.7  PROT 6.5 7.2  ALBUMIN 3.9 4.1   Recent Labs  Lab 05/17/17 0440 05/19/17 0206  LIPASE 32 20   No results for input(s): AMMONIA in the last 168 hours. Coagulation Profile: No results for input(s): INR, PROTIME in the last 168 hours. Cardiac Enzymes: No results for input(s): CKTOTAL, CKMB, CKMBINDEX, TROPONINI in the last 168 hours. BNP (last 3 results) No results for input(s): PROBNP in the last 8760 hours. HbA1C: No results for input(s): HGBA1C in the last 72  hours. CBG: No results for input(s): GLUCAP in the last 168 hours. Lipid Profile: No results for input(s): CHOL, HDL, LDLCALC, TRIG, CHOLHDL, LDLDIRECT in the last 72 hours. Thyroid Function Tests: No results for input(s): TSH, T4TOTAL, FREET4, T3FREE, THYROIDAB in the last 72 hours. Anemia Panel: No results for input(s): VITAMINB12, FOLATE, FERRITIN, TIBC, IRON, RETICCTPCT in the last 72 hours. Urine analysis:    Component Value Date/Time   COLORURINE YELLOW 05/19/2017 0148   APPEARANCEUR CLEAR 05/19/2017 0148   LABSPEC 1.032 (H) 05/19/2017 0148   PHURINE 5.0 05/19/2017 0148   GLUCOSEU NEGATIVE 05/19/2017 0148   HGBUR NEGATIVE 05/19/2017 0148   BILIRUBINUR SMALL (A) 05/19/2017 0148   KETONESUR 80 (A) 05/19/2017 0148   PROTEINUR 30 (A) 05/19/2017 0148   UROBILINOGEN 0.2 11/04/2012 1226   NITRITE NEGATIVE 05/19/2017 0148   LEUKOCYTESUR NEGATIVE 05/19/2017 0148   Sepsis Labs: !!!!!!!!!!!!!!!!!!!!!!!!!!!!!!!!!!!!!!!!!!!! @LABRCNTIP (procalcitonin:4,lacticidven:4) )No results found for this or any previous visit (from the past 240 hour(s)).   Radiological Exams on Admission: Dg Chest 2 View  Result Date: 05/19/2017 CLINICAL DATA:  Shortness of breath and right-sided chest pain EXAM: CHEST  2 VIEW COMPARISON:  Chest  radiograph 01/12/2017 and 12/03/2016 CT abdomen pelvis 01/12/2017 and 12/03/2016 FINDINGS: There is a large right pneumothorax with apical and lateral components occupying at least 50% of the right hemithoracic volume. There is no leftward shift of the mediastinal structures. There is scarring at the left lung base, likely secondary to emphysema. Pons old CTs of 12/03/2016 and 01/12/2017, there are large bullae demonstrated at the right lung base. However, the lateral and posterior basal segments of the right lower lobe or normally inflated at that time and the radiographic appearance was essentially normal. There has been a marked interval change, which may be secondary to a right rupture of a bulla. IMPRESSION: Large right pneumothorax, occupying approximately 50% the right hemithoracic volume. Given the patient's known bullous emphysema, this is probably secondary to rupture of a bulla. No mediastinal shift. Critical Value/emergent results were called by telephone at the time of interpretation on 05/19/2017 at 5:17 am to [place] ROBINSON , who verbally acknowledged these results. Electronically Signed   By: Deatra RobinsonKevin  Herman M.D.   On: 05/19/2017 05:17   Dg Chest 1v Repeat Same Day  Result Date: 05/19/2017 CLINICAL DATA:  Right chest tube placement. EXAM: CHEST - 1 VIEW SAME DAY COMPARISON:  05/19/2017 at 0457 hours FINDINGS: The cardiomediastinal silhouette is unchanged. The lungs are hyperinflated with underlying emphysema. A right chest tube has been placed and terminates at the lung apex. There is a small residual right-sided pneumothorax, most conspicuous laterally and greatly decreased in size from the prior study with re-expansion of the right lung. No airspace consolidation or large pleural effusion is seen. IMPRESSION: Small residual right pneumothorax following chest tube placement. Electronically Signed   By: Sebastian AcheAllen  Grady M.D.   On: 05/19/2017 07:41    Assessment/Plan Active Problems:    Spontaneous pneumothorax  # Acute spontaneous pneumothorax likely rupture of bullous emphysema with no mediastinal shift: -Patient was already evaluated by cardiothoracic surgeon and chest tube placed on the right side.  Patient reported his shortness of breath is better but has some discomfort at the site of chest tube.  Continue pain management, supportive care.  Patient will be admitted at [facility] stepdown unit per surgeon.  Continue bronchodilators and oxygen therapy.  Repeat chest x-ray in the morning and follow-up CTVS's recommendation. -Pulmonary consult requested as per surgeon's recommendation.  Discussed with  Dr.Yacoub.  #History of COPD/active smoker: Education provided to the patient regarding quitting smoking.  Nicotine patch ordered.  Continue bronchodilators.  #Acute respiratory failure with hypoxia in the setting of spontaneous pneumothorax: Currently on 2 L of oxygen.  #Hypokalemia: added potassium chloride in IV fluid.  Repeat labs in the morning.  #History of chronic pancreatitis with chronic abdominal pain: Continue current pain regimen and supportive care.  Mild leukocytosis likely stress related.  Monitor CBC.  Patient is afebrile.  Holding off on antibiotics.  DVT prophylaxis: SCD.  No anticoagulation because patient just has chest tube placed Code Status: Full code Family Communication: No family at bedside and ER Disposition Plan: Stepdown unit at [facility] Consults called: With cardiothoracic surgeon, pulmonary consult Admission status: Inpatient   Dron Jaynie Collins MD [facility] Pager [phone]  If 7PM-7AM, please contact night-coverage [email or web address] Password [email or web address]  05/19/2017, 8:04 AM

## 2017-05-19 NOTE — Op Note (Signed)
CARDIOTHORACIC SURGERY OPERATIVE NOTE  Date of Procedure:  05/19/2017  Preoperative Diagnosis: Right Spontaneous Pneumothorax  Postoperative Diagnosis: Same  Procedure:   Right chest tube placement  Surgeon:   Salvatore Decentlarence H. Cornelius Moraswen, MD  Anesthesia: 1% lidocaine local with intravenous sedation    DETAILS OF THE OPERATIVE PROCEDURE  Following full informed consent the patient was given morphine sulfate 4 mg and midazolam 2 mg intravenously and continuously monitored for rhythm, BP and oxygen saturation. The right chest was prepared and draped in a sterile manner. 1% lidocaine was utilized to anesthetize the skin and subcutaneous tissues. A small incision was made and a 28 French straight chest tube was placed through the incision into the pleural space. The tube was secured to the skin and connected to a closed suction collection device. The patient tolerated the procedure well. A portable CXR was ordered. There were no complications.    Salvatore Decentlarence H. Cornelius Moraswen, MD 05/19/2017 6:55 AM

## 2017-05-19 NOTE — Consult Note (Addendum)
[address]       [place] [id number]             [phone]          CARDIOTHORACIC SURGERY CONSULTATION REPORT  PCP is Renaye RakersBland, Veita, MD Referring Provider is Cy BlamerPalumbo, April, MD  Reason for consultation:  Spontaneous pneumothorax  HPI:  Patient is a 56 yo male w/ h/o COPD, longstanding and ongoing tobacco abuse, chronic bronchitis, chronic SOB, chronic pancreatitis with chronic abdominal pain, and remote h/o alcohol abuse who developed sudden onset increased SOB with right-sided chest pain last night, progressed in severity, and prompted presentation [facility] where CXR reveals large right pneumothorax.  Cardiothoracic surgery consult was requested for chest tube placement.  Patient reports chronic cough productive of whitish sputum unchanged in character or severity.  He denies fevers, chills, recent trauma, or previous history of spontaneous pneumothorax.   Past Medical History:  Diagnosis Date  . Chronic pancreatitis (HCC)   . Depression   . Duodenitis    by EGD 1/07  . Emphysema    unclear diagnosis noted in previous EMR  . Heme positive stool    neg colonoscopies 11/2008 and 01/2008  . History of alcohol abuse   . Hyperlipidemia   . Psoriasis    extensive, responds to strong steroids, unable to start molecular therapies due to cost  . RUE weakness    2/2 cervical spondylosis  . Smoking   . Stomach ulcer     Past Surgical History:  Procedure Laterality Date  . ESOPHAGOGASTRODUODENOSCOPY (EGD) WITH PROPOFOL N/A 12/04/2016   Procedure: ESOPHAGOGASTRODUODENOSCOPY (EGD) WITH PROPOFOL;  Surgeon: Kathi DerBrahmbhatt, Parag, MD;  Location: [facility];  Service: Gastroenterology;  Laterality: N/A;  . LAPAROSCOPIC CHOLECYSTECTOMY  11/28/2016   w/IOC  . LAPAROSCOPIC CHOLECYSTECTOMY SINGLE SITE WITH INTRAOPERATIVE CHOLANGIOGRAM N/A 11/28/2016   Procedure: LAPAROSCOPIC CHOLECYSTECTOMY SINGLE SITE WITH INTRAOPERATIVE CHOLANGIOGRAM;  Surgeon: Karie SodaGross, Steven, MD;   Location: [facility];  Service: General;  Laterality: N/A;  . LIVER BIOPSY N/A 11/28/2016   Procedure: NEEDLE CORE LIVER BIOPSY;  Surgeon: Karie SodaGross, Steven, MD;  Location: [facility];  Service: General;  Laterality: N/A;  . TYMPANOSTOMY TUBE PLACEMENT Bilateral ~ 1970    Family History  Problem Relation Age of Onset  . Diabetes Mother   . Heart disease Father   . Cancer Father        ?kidney or liver cancer    Social History   Socioeconomic History  . Marital status: Divorced    Spouse name: Not on file  . Number of children: Not on file  . Years of education: Not on file  . Highest education level: Not on file  Social Needs  . Financial resource strain: Not on file  . Food insecurity - worry: Not on file  . Food insecurity - inability: Not on file  . Transportation needs - medical: Not on file  . Transportation needs - non-medical: Not on file  Occupational History  . Not on file  Tobacco Use  . Smoking status: Current Every Day Smoker    Packs/day: 1.00    Years: 40.00    Pack years: 40.00    Types: Cigarettes  . Smokeless tobacco: Never Used  Substance and Sexual Activity  . Alcohol use: Yes    Comment: 12/03/2016 "nothing since 2008"  . Drug use: No    Comment: quit in high school  . Sexual activity: Not Currently    Comment: trying  for disability to gain medicare coverage, has pain contract and warned that if tested positive again for cocaine will not be given narcotics  Other Topics Concern  . Not on file  Social History Narrative   Financial assistance approved [facility] and has [facility] card   [facility]  March 08, 2010 2:17 PM    Prior to Admission medications   Medication Sig Start Date End Date Taking? Authorizing Provider  HYDROcodone-acetaminophen (NORCO/VICODIN) 5-325 MG tablet Take 1-2 tablets by mouth every 4 (four) hours as needed for moderate pain. 05/17/17  Yes Pollina, Canary Brimhristopher J, MD  atorvastatin (LIPITOR) 40 MG tablet Take 1 tablet  (40 mg total) by mouth daily. Patient taking differently: Take 40 mg by mouth at bedtime.  10/15/16 03/28/17  Pricilla Riffleoss, Paula V, MD  ondansetron (ZOFRAN) 4 MG tablet Take 1 tablet (4 mg total) by mouth every 8 (eight) hours as needed for nausea. Patient not taking: Reported on 05/19/2017 11/28/16   Karie SodaGross, Steven, MD  pantoprazole (PROTONIX) 40 MG tablet Take 1 tablet (40 mg total) by mouth 2 (two) times daily. Patient not taking: Reported on 05/19/2017 12/06/16   Franne FortsMeuth, Brooke A, PA-C  promethazine (PHENERGAN) 25 MG tablet Take 1 tablet (25 mg total) by mouth every 6 (six) hours as needed for nausea or vomiting. Patient not taking: Reported on 05/19/2017 03/28/17   Frederik PearMcDonald, Mia A, PA-C    Current Facility-Administered Medications  Medication Dose Route Frequency Provider Last Rate Last Dose  . ipratropium-albuterol (DUONEB) 0.5-2.5 (3) MG/3ML nebulizer solution 3 mL  3 mL Nebulization Q4H Purcell Nailswen, Clarence H, MD      . midazolam (VERSED) injection 2 mg  2 mg Intravenous Once Purcell Nailswen, Clarence H, MD      . morphine 2 MG/ML injection 2 mg  2 mg Intravenous Q1H PRN Purcell Nailswen, Clarence H, MD      . ondansetron [facility](ZOFRAN) injection 4 mg  4 mg Intravenous Once Robinson, [place] N, [place]PA-C       Current Outpatient Medications  Medication Sig Dispense Refill  . HYDROcodone-acetaminophen (NORCO/VICODIN) 5-325 MG tablet Take 1-2 tablets by mouth every 4 (four) hours as needed for moderate pain. 15 tablet 0  . atorvastatin (LIPITOR) 40 MG tablet Take 1 tablet (40 mg total) by mouth daily. (Patient taking differently: Take 40 mg by mouth at bedtime. ) 90 tablet 3  . ondansetron (ZOFRAN) 4 MG tablet Take 1 tablet (4 mg total) by mouth every 8 (eight) hours as needed for nausea. (Patient not taking: Reported on 05/19/2017) 8 tablet 5  . pantoprazole (PROTONIX) 40 MG tablet Take 1 tablet (40 mg total) by mouth 2 (two) times daily. (Patient not taking: Reported on 05/19/2017) 60 tablet 1  . promethazine (PHENERGAN) 25 MG tablet Take 1  tablet (25 mg total) by mouth every 6 (six) hours as needed for nausea or vomiting. (Patient not taking: Reported on 05/19/2017) 20 tablet 0    Allergies  Allergen Reactions  . Nsaids Other (See Comments)    Bleeding gastritis      Review of Systems:  Per HPI.  Remainder non-contributory.     Physical Exam:   BP 120/78   Pulse (!) 102   Temp 97.6 F (36.4 C) (Oral)   Resp 19   Ht 5\' 8"  (1.727 m)   Wt 125 lb (56.7 kg)   SpO2 96%   BMI 19.01 kg/m   General:  Thin, malnourished-appearing, moderate discomfort and SOB  HEENT:  Unremarkable  Neck:   no JVD, no bruits, no adenopathy   Chest:   Severely diminished breath sounds on right side, clear on left  CV:   RRR, no  murmur   Abdomen:  soft, non-tender, no masses   Extremities:  warm, well-perfused, pulses palpable, no lower extremity edema  Rectal/GU  Deferred  Neuro:   Grossly non-focal and symmetrical throughout  Skin:   Clean and dry, no rashes, no breakdown  Diagnostic Tests:  CHEST  2 VIEW  COMPARISON:  Chest radiograph 01/12/2017 and 12/03/2016  CT abdomen pelvis 01/12/2017 and 12/03/2016  FINDINGS: There is a large right pneumothorax with apical and lateral components occupying at least 50% of the right hemithoracic volume. There is no leftward shift of the mediastinal structures. There is scarring at the left lung base, likely secondary to emphysema. Pons old CTs of 12/03/2016 and 01/12/2017, there are large bullae demonstrated at the right lung base. However, the lateral and posterior basal segments of the right lower lobe or normally inflated at that time and the radiographic appearance was essentially normal. There has been a marked interval change, which may be secondary to a right rupture of a bulla.  IMPRESSION: Large right pneumothorax, occupying approximately 50% the right hemithoracic volume. Given the patient's known bullous emphysema, this is probably secondary to rupture of a bulla.  No mediastinal shift.  Critical Value/emergent results were called by telephone at the time of interpretation on 05/19/2017 at 5:17 am to [place] ROBINSON , who verbally acknowledged these results.   Electronically Signed   By: Deatra Robinson M.D.   On: 05/19/2017 05:17   Impression:  Large right primary spontaneous pneumothorax in the setting of underlying COPD w/ chronic bronchitis and active tobacco abuse  Plan:  Chest tube placement.  Patient will need to be admitted to step-down unit (4E or 2C) on the medical service at [facility].  He may need pulmonary medicine consultation.   I spent in excess of 60 minutes during the conduct of this hospital consultation and >50% of this time involved direct face-to-face encounter for counseling and/or coordination of the patient's care.    Salvatore Decent. Cornelius Moras, MD 05/19/2017 6:16 AM

## 2017-05-19 NOTE — ED Triage Notes (Signed)
Patient complaining upper right abdominal pain. Patient was seen 2 days ago. Patient states he can not keep down his pain medication. Patient states he can not take a deep breathe due to the pain. Patient is 90 on room air. Patient has a hx of copd.

## 2017-05-19 NOTE — ED Notes (Signed)
Pt given urinal for UA. He stated, "I'm pretty dry so they might have to give me some fluids before I can go."

## 2017-05-19 NOTE — Plan of Care (Signed)
Discussed pain management with patient and initial goal is 5/10. Pt is currently 8-9/10.

## 2017-05-19 NOTE — ED Provider Notes (Signed)
[facility]-EMERGENCY DEPT Provider Note   CSN: [id number] Arrival date & time: 05/19/17  0143     History   Chief Complaint Chief Complaint  Patient presents with  . Pancreatitis  . Abdominal Pain    HPI [name] is a 56 y.o. male with PMHx chronic pancreatitis, COPD, tobacco abuse, alcohol abuse, presenting to the ED for subsequent visit for epigastric abdominal pain and nausea. Pt seen on 05/17/17 for similar complaint, with normal workup and discharged with 3 days rx of norco. Pt states he has been taking that medication but has been unable to keep it down due to the nausea. He provided the bottle showing 3 pills remaining.  Per chart review, patient used to have pain management, however was dismissed in June.  Rates the pain today is the same as it was 2 days ago, however he is unable to control his nausea.  Patient also states he has been more short of breath today.  States he has COPD, and is on the "borderline of needing oxygen at home."  Shortness breath been worsening since today, with cough.  Denies fever, urinary symptoms, or other complaints.  Status post cholecystectomy.  The history is provided by the patient.    Past Medical History:  Diagnosis Date  . Chronic pancreatitis (HCC)   . Depression   . Duodenitis    by EGD 1/07  . Emphysema    unclear diagnosis noted in previous EMR  . Heme positive stool    neg colonoscopies 11/2008 and 01/2008  . History of alcohol abuse   . Hyperlipidemia   . Psoriasis    extensive, responds to strong steroids, unable to start molecular therapies due to cost  . RUE weakness    2/2 cervical spondylosis  . Smoking   . Stomach ulcer     Patient Active Problem List   Diagnosis Date Noted  . Spontaneous pneumothorax 05/19/2017  . Chronic cholecystitis with calculus s/p lap cholecystectomy 11/28/2016 11/28/2016  . Alcoholic fatty liver 11/28/2016  . Psoriasis   . Duodenitis   . HYPERKALEMIA 02/20/2010    . PSORIASIS 02/16/2009  . Abdominal pain 11/22/2008  . Chronic pancreatitis (HCC) 04/13/2008  . EROSIVE GASTRITIS 02/24/2008  . BLOOD IN STOOL, OCCULT 01/18/2008  . MELENA, HX OF 11/26/2007  . Dyslipidemia 08/13/2007  . TOBACCO ABUSE 04/23/2007    Past Surgical History:  Procedure Laterality Date  . ESOPHAGOGASTRODUODENOSCOPY (EGD) WITH PROPOFOL N/A 12/04/2016   Procedure: ESOPHAGOGASTRODUODENOSCOPY (EGD) WITH PROPOFOL;  Surgeon: Kathi Der, MD;  Location: [facility];  Service: Gastroenterology;  Laterality: N/A;  . LAPAROSCOPIC CHOLECYSTECTOMY  11/28/2016   w/IOC  . LAPAROSCOPIC CHOLECYSTECTOMY SINGLE SITE WITH INTRAOPERATIVE CHOLANGIOGRAM N/A 11/28/2016   Procedure: LAPAROSCOPIC CHOLECYSTECTOMY SINGLE SITE WITH INTRAOPERATIVE CHOLANGIOGRAM;  Surgeon: Karie Soda, MD;  Location: [facility];  Service: General;  Laterality: N/A;  . LIVER BIOPSY N/A 11/28/2016   Procedure: NEEDLE CORE LIVER BIOPSY;  Surgeon: Karie Soda, MD;  Location: [facility];  Service: General;  Laterality: N/A;  . TYMPANOSTOMY TUBE PLACEMENT Bilateral ~ 1970       Home Medications    Prior to Admission medications   Medication Sig Start Date End Date Taking? Authorizing Provider  HYDROcodone-acetaminophen (NORCO/VICODIN) 5-325 MG tablet Take 1-2 tablets by mouth every 4 (four) hours as needed for moderate pain. 05/17/17  Yes Pollina, Canary Brim, MD  atorvastatin (LIPITOR) 40 MG tablet Take 1 tablet (40 mg total) by mouth daily. Patient taking differently: Take 40 mg  by mouth at bedtime.  10/15/16 03/28/17  Pricilla Riffle, MD  ondansetron (ZOFRAN) 4 MG tablet Take 1 tablet (4 mg total) by mouth every 8 (eight) hours as needed for nausea. Patient not taking: Reported on 05/19/2017 11/28/16   Karie Soda, MD  pantoprazole (PROTONIX) 40 MG tablet Take 1 tablet (40 mg total) by mouth 2 (two) times daily. Patient not taking: Reported on 05/19/2017 12/06/16   Franne Forts, PA-C  promethazine (PHENERGAN) 25 MG  tablet Take 1 tablet (25 mg total) by mouth every 6 (six) hours as needed for nausea or vomiting. Patient not taking: Reported on 05/19/2017 03/28/17   Barkley Boards, PA-C    Family History Family History  Problem Relation Age of Onset  . Diabetes Mother   . Heart disease Father   . Cancer Father        ?kidney or liver cancer    Social History Social History   Tobacco Use  . Smoking status: Current Every Day Smoker    Packs/day: 1.00    Years: 40.00    Pack years: 40.00    Types: Cigarettes  . Smokeless tobacco: Never Used  Substance Use Topics  . Alcohol use: Yes    Comment: 12/03/2016 "nothing since 2008"  . Drug use: No    Comment: quit in high school     Allergies   Nsaids   Review of Systems Review of Systems  Constitutional: Negative for chills and fever.  Respiratory: Positive for cough and shortness of breath.   Cardiovascular: Negative for chest pain.  Gastrointestinal: Positive for abdominal pain, nausea and vomiting.  All other systems reviewed and are negative.    Physical Exam Updated Vital Signs BP 124/85   Pulse 100   Temp 97.6 F (36.4 C) (Oral)   Resp 17   Ht 5\' 8"  (1.727 m)   Wt 56.7 kg (125 lb)   SpO2 95%   BMI 19.01 kg/m   Physical Exam  Constitutional: He appears well-developed and well-nourished.  HENT:  Head: Normocephalic and atraumatic.  Mouth/Throat: Oropharynx is clear and moist.  Eyes: Conjunctivae are normal.  Cardiovascular: Normal rate, regular rhythm, normal heart sounds and intact distal pulses.  Pulmonary/Chest: Effort normal. No respiratory distress. He has no wheezes. He has no rhonchi. He has no rales.  Decreased breath sounds bilaterally, some increased work of breathing.  Abdominal: Soft. Normal appearance and bowel sounds are normal. He exhibits no distension and no mass. There is tenderness in the epigastric area. There is no rigidity and no guarding.  Neurological: He is alert.  Skin: Skin is warm.    Psychiatric: He has a normal mood and affect. His behavior is normal.  Nursing note and vitals reviewed.    ED Treatments / Results  Labs (all labs ordered are listed, but only abnormal results are displayed) Labs Reviewed  COMPREHENSIVE METABOLIC PANEL - Abnormal; Notable for the following components:      Result Value   Potassium 3.4 (*)    CO2 16 (*)    Glucose, Bld 165 (*)    ALT 16 (*)    All other components within normal limits  CBC - Abnormal; Notable for the following components:   WBC 12.0 (*)    All other components within normal limits  URINALYSIS, ROUTINE W REFLEX MICROSCOPIC - Abnormal; Notable for the following components:   Specific Gravity, Urine 1.032 (*)    Bilirubin Urine SMALL (*)    Ketones, ur 80 (*)  Protein, ur 30 (*)    All other components within normal limits  RAPID URINE DRUG SCREEN, HOSP PERFORMED - Abnormal; Notable for the following components:   Opiates POSITIVE (*)    Benzodiazepines POSITIVE (*)    All other components within normal limits  LIPASE, BLOOD    EKG  EKG Interpretation None       Radiology Dg Chest 2 View  Result Date: 05/19/2017 CLINICAL DATA:  Shortness of breath and right-sided chest pain EXAM: CHEST  2 VIEW COMPARISON:  Chest radiograph 01/12/2017 and 12/03/2016 CT abdomen pelvis 01/12/2017 and 12/03/2016 FINDINGS: There is a large right pneumothorax with apical and lateral components occupying at least 50% of the right hemithoracic volume. There is no leftward shift of the mediastinal structures. There is scarring at the left lung base, likely secondary to emphysema. Pons old CTs of 12/03/2016 and 01/12/2017, there are large bullae demonstrated at the right lung base. However, the lateral and posterior basal segments of the right lower lobe or normally inflated at that time and the radiographic appearance was essentially normal. There has been a marked interval change, which may be secondary to a right rupture of a  bulla. IMPRESSION: Large right pneumothorax, occupying approximately 50% the right hemithoracic volume. Given the patient's known bullous emphysema, this is probably secondary to rupture of a bulla. No mediastinal shift. Critical Value/emergent results were called by telephone at the time of interpretation on 05/19/2017 at 5:17 am to [place] Emalea Mix , who verbally acknowledged these results. Electronically Signed   By: Deatra RobinsonKevin  Herman M.D.   On: 05/19/2017 05:17    Procedures Procedures (including critical care time)  Medications Ordered in ED Medications  ondansetron (ZOFRAN) injection 4 mg (4 mg Intravenous Refused 05/19/17 0603)  morphine 2 MG/ML injection 2 mg (not administered)  midazolam (VERSED) injection 2 mg (not administered)  ipratropium-albuterol (DUONEB) 0.5-2.5 (3) MG/3ML nebulizer solution 3 mL (not administered)  0.9 %  sodium chloride infusion (not administered)  fentaNYL (SUBLIMAZE) injection 25-50 mcg (not administered)  morphine 2 MG/ML injection 2 mg (not administered)  lidocaine (XYLOCAINE) 1 % (with pres) injection 20 mL (not administered)  ondansetron (ZOFRAN) injection 4 mg (4 mg Intravenous Given 05/19/17 0449)  ketorolac (TORADOL) 30 MG/ML injection 30 mg (30 mg Intravenous Given 05/19/17 0449)  ipratropium-albuterol (DUONEB) 0.5-2.5 (3) MG/3ML nebulizer solution 3 mL (3 mLs Nebulization Given 05/19/17 0447)     Initial Impression / Assessment and Plan / ED Course  I have reviewed the triage vital signs and the nursing notes.  Pertinent labs & imaging results that were available during my care of the patient were reviewed by me and considered in my medical decision making (see chart for details).   Pt w chronic pancreatitis with chronic pain, presenting with subseuent visit for abdominal pain. Seen on 05/17/17, and prescribed norco, however pt unable to keep medication down 2/t nausea. Pt also noted to be hypoxic on arrival with o2 sat 88%. Pt stating he has felt  acutely more SOB from baseline within the last 24 hours. Pt placed on O2 [place] with improvement in O2 sat. CBC showing slightly elevated WBC since 2 days ago to 12. CMP unremarkable, lipase wnl. U/A neg. Pain and nausea treated in ED. CXR showing acute right sided pnuemothorax, involving about 50%, suspected 2/t ruptured bulla. Previous CT chest showing bulla bilaterally. Pt discussed with Dr. Nicanor AlconPalumbo. Concern for possibility of ruptured bulla on left lung while placing R-sided chest tube. Dr. Cornelius Moraswen with thoracic surgery consulted.  Clinical Course as of May 20 635  Mon May 19, 2017  [id number] Dr. Cornelius Moraswen in ED to place chest tube, and [facility] Spoke with Dr. Antionette Charpyd with [facility], [facility].   [JR]    Clinical Course User Index [JR] French Kendra, [place] N, PA-C   The patient appears reasonably stabilized for admission considering the current resources, flow, and capabilities available in the ED at this time, and I doubt any other [facility] requiring further screening and/or treatment in the ED prior to admission.  Final Clinical Impressions(s) / ED Diagnoses   Final diagnoses:  Chronic abdominal pain  Pneumothorax, right    ED Discharge Orders    None       Farida Mcreynolds, [place] N, PA-C 05/19/17 0636    Palumbo, April, MD 05/19/17 [id number]

## 2017-05-19 NOTE — ED Notes (Signed)
[facility] called regarding patient transport.  

## 2017-05-20 ENCOUNTER — Inpatient Hospital Stay (HOSPITAL_COMMUNITY): Payer: Medicaid Other

## 2017-05-20 DIAGNOSIS — J9601 Acute respiratory failure with hypoxia: Secondary | ICD-10-CM

## 2017-05-20 DIAGNOSIS — J431 Panlobular emphysema: Secondary | ICD-10-CM

## 2017-05-20 DIAGNOSIS — G8929 Other chronic pain: Secondary | ICD-10-CM

## 2017-05-20 DIAGNOSIS — Z938 Other artificial opening status: Secondary | ICD-10-CM

## 2017-05-20 DIAGNOSIS — J939 Pneumothorax, unspecified: Secondary | ICD-10-CM

## 2017-05-20 DIAGNOSIS — Z9689 Presence of other specified functional implants: Secondary | ICD-10-CM

## 2017-05-20 DIAGNOSIS — R109 Unspecified abdominal pain: Secondary | ICD-10-CM

## 2017-05-20 LAB — CBC
HCT: 39.7 % (ref 39.0–52.0)
HEMOGLOBIN: 13.5 g/dL (ref 13.0–17.0)
MCH: 30.4 pg (ref 26.0–34.0)
MCHC: 34 g/dL (ref 30.0–36.0)
MCV: 89.4 fL (ref 78.0–100.0)
PLATELETS: DECREASED 10*3/uL (ref 150–400)
RBC: 4.44 MIL/uL (ref 4.22–5.81)
RDW: 15.5 % (ref 11.5–15.5)
WBC: 7.7 10*3/uL (ref 4.0–10.5)

## 2017-05-20 LAB — BASIC METABOLIC PANEL
ANION GAP: 9 (ref 5–15)
BUN: 11 mg/dL (ref 6–20)
CALCIUM: 8.3 mg/dL — AB (ref 8.9–10.3)
CO2: 20 mmol/L — ABNORMAL LOW (ref 22–32)
Chloride: 109 mmol/L (ref 101–111)
Creatinine, Ser: 0.67 mg/dL (ref 0.61–1.24)
GLUCOSE: 129 mg/dL — AB (ref 65–99)
Potassium: 3.1 mmol/L — ABNORMAL LOW (ref 3.5–5.1)
Sodium: 138 mmol/L (ref 135–145)

## 2017-05-20 LAB — MRSA PCR SCREENING: MRSA BY PCR: POSITIVE — AB

## 2017-05-20 MED ORDER — MUPIROCIN 2 % EX OINT
1.0000 "application " | TOPICAL_OINTMENT | Freq: Two times a day (BID) | CUTANEOUS | Status: DC
  Administered 2017-05-20 – 2017-05-24 (×9): 1 via NASAL
  Filled 2017-05-20 (×2): qty 22

## 2017-05-20 MED ORDER — OXYCODONE HCL 5 MG PO TABS
10.0000 mg | ORAL_TABLET | ORAL | Status: DC | PRN
  Administered 2017-05-20 – 2017-05-24 (×31): 10 mg via ORAL
  Filled 2017-05-20 (×33): qty 2

## 2017-05-20 MED ORDER — CHLORHEXIDINE GLUCONATE CLOTH 2 % EX PADS
6.0000 | MEDICATED_PAD | Freq: Every day | CUTANEOUS | Status: DC
  Administered 2017-05-20 – 2017-05-24 (×3): 6 via TOPICAL

## 2017-05-20 MED ORDER — POTASSIUM CHLORIDE CRYS ER 20 MEQ PO TBCR
40.0000 meq | EXTENDED_RELEASE_TABLET | Freq: Once | ORAL | Status: AC
  Administered 2017-05-20: 40 meq via ORAL
  Filled 2017-05-20: qty 2

## 2017-05-20 NOTE — Progress Notes (Addendum)
      [address]       [place] [id number]             [phone]         Subjective: Having a lot of pain at the chest tube site.   Objective: Vital signs in last 24 hours: Temp:  [98 F (36.7 C)-98.3 F (36.8 C)] 98.1 F (36.7 C) (11/27 0354) Pulse Rate:  [72-94] 81 (11/27 0354) Cardiac Rhythm: Normal sinus rhythm (11/27 0700) Resp:  [10-25] 16 (11/27 0354) BP: (82-131)/(55-99) 103/62 (11/27 0354) SpO2:  [92 %-100 %] 92 % (11/27 0743) Weight:  [122 lb 9.2 oz (55.6 kg)-123 lb 10.9 oz (56.1 kg)] 123 lb 10.9 oz (56.1 kg) (11/27 0354)     Intake/Output from previous day: 11/26 0701 - 11/27 0700 In: 412.5 [I.V.:412.5] Out: 0  Intake/Output this shift: No intake/output data recorded.  General appearance: alert, cooperative and no distress Heart: regular rate and rhythm, S1, S2 normal, no murmur, click, rub or gallop Lungs: clear to auscultation bilaterally Abdomen: normal bowel sounds Extremities: no edema Wound: chest tube in good position  Lab Results: Recent Labs    05/19/17 0206  WBC 12.0*  HGB 14.8  HCT 43.0  PLT 189   BMET:  Recent Labs    05/19/17 0206  NA 139  K 3.4*  CL 108  CO2 16*  GLUCOSE 165*  BUN 15  CREATININE 0.79  CALCIUM 9.1    PT/INR: No results for input(s): LABPROT, INR in the last 72 hours. ABG    Component Value Date/Time   TCO2 16 12/05/2009 1124   CBG (last 3)  No results for input(s): GLUCAP in the last 72 hours.  Assessment/Plan: S/p chest tube placement.   1. Chest tube placement yesterday morning for right spontaneous pneumothorax. Last CXR 11/26 showed: Right chest tube in place with decreased size of residual pneumothorax from prior, with small persistent inferolateral component. Increasing atelectasis. Otherwise no change. 2. No air leak 3. Pain- currently on Oxy 10mg  tabs q4, will switch to q3 PRN. Also on Fentanyl 25-6950mcg q2 hours PRN. Was not interested in an extended release option. Opioid  tolerance is high due to chronic use for pancreatitis.   Plan: continue chest tube and ordered a CXR for the morning. Work on pain control.     LOS: 1 day    Sharlene Doryessa N Conte 05/20/2017  I have seen and examined the patient and agree with the assessment and plan as outlined.  Keep tube on suction for now.  Purcell Nailslarence H Tanise Russman, MD 05/20/2017 10:15 AM

## 2017-05-20 NOTE — Progress Notes (Signed)
PROGRESS NOTE    [name]  ZOX:[id number] DOB: 06-Sep-1960 DOA: 05/19/2017 PCP: Patient, No Pcp Per   Brief Narrative:  56 y.o. WM PMHx   Depression, COPD/emphysema, active smoker not on home oxygen, EtOH abuse, EtOH Chronic Pancreatitis ( has not been drinking for last 10 years), Duodenitis , Chronic Abdominal Pain, seen in ER 2 days ago for abdominal pain and nausea when patient was discharged with pain medications   Presented with worsening right-sided chest, epigastric pain associated with shortness of breath.  Patient reported that his symptoms are worsening for last 2 days to the point that he could not breathe well.  The right-sided chest pain is sharp, no radiation.  No associated with breathing or activity as per patient.  Denied fever, chills, headache, dizziness, dysuria, urgency.  He has chronic loose bowel movement and chronic cough which has not changed as per patient.   ED Course: In the ER patient was found to have hypoxia, right-sided pneumothorax.  The chest tube was placed by cardiothoracic surgeon and recommended to admit under medical service at [facility].  Patient reported some discomfort at the site of chest tube.    Subjective: 11/27 A/O 4, negative CP, positive SOB, positive chest wall pain (RIGHT side around CT).negative abdominal pain, negative N/V. States current pain regimen working fairly well  .   Assessment & Plan:   Active Problems:   Spontaneous pneumothorax  Acute spontaneous pneumothorax -Most likely secondary to ruptured bullous emphysema: Negative mediastinal shift -Cardiothoracic surgery placed right side chest tube -Chest tube to wall suction per cardiac thoracic surgery, hopefully on 11/28 to waterseal.  COPD -Patient continues to be active smoker. Counseled patient extensively on sequela of continuing to smoke to include worsening COPD, increased future risk of spontaneous pneumothorax, MI, CVA, DEATH.  -Nicotine patch -  DuoNeb  QID -After chest tube removal will consider transitioning patient to Pulmicort and Spiriva prior to discharge.  Acute respiratory failure with hypoxia -Secondary to spontaneous pneumothorax. -Titrate O2 to maintain SPO2 8993%   Hypokalemia -K-Dur 50 mEq   Chronic Pancreatitis/chronic abdominal pain -Continue current pain regimen      DVT prophylaxis: SCD Code Status: Full Family Communication: None Disposition Plan: TBD     Consultants:  Cardiothoracic surgery    Procedures/Significant Events:  Right chest tube 11/26>>    I have personally reviewed and interpreted all radiology studies and my findings are as above.  VENTILATOR SETTINGS:    Cultures   Antimicrobials:    Devices    LINES / TUBES:      Continuous Infusions: . 0.9 % NaCl with KCl 20 mEq / L 75 mL/hr (05/20/17 0407)     Objective: Vitals:   05/20/17 0238 05/20/17 0354 05/20/17 0743 05/20/17 0840  BP:  103/62  104/68  Pulse: 75 81  79  Resp: 15 16  20   Temp:  98.1 F (36.7 C)  98.1 F (36.7 C)  TempSrc:  Oral  Oral  SpO2: 99% 99% 92% 96%  Weight:  123 lb 10.9 oz (56.1 kg)    Height:        Intake/Output Summary (Last 24 hours) at 05/20/2017 0854 Last data filed at 05/20/2017 0407 Gross per 24 hour  Intake 412.5 ml  Output 0 ml  Net 412.5 ml   Filed Weights   05/19/17 0151 05/19/17 2035 05/20/17 0354  Weight: 125 lb (56.7 kg) 122 lb 9.2 oz (55.6 kg) 123 lb 10.9 oz (56.1 kg)    Examination:  General: A/O 4,No acute respiratory distress, cachectic Neck:  Negative scars, masses, torticollis, lymphadenopathy, JVD Lungs: poor air movement in all lung fields, negative crackles or wheezes Cardiovascular: Regular rate and rhythm without murmur gallop or rub normal S1 and S2, right side chest tube covered and clean negative air leak The wall suction. Abdomen: negative abdominal pain, nondistended, positive soft, bowel sounds, no rebound, no ascites, no appreciable  mass Extremities: No significant cyanosis, clubbing, or edema bilateral lower extremities Skin: Negative rashes, lesions, ulcers Psychiatric:  Negative depression, negative anxiety, negative fatigue, negative mania  Central nervous system:  Cranial nerves II through XII intact, tongue/uvula midline, all extremities muscle strength 5/5, sensation intact throughout, negative dysarthria, negative expressive aphasia, negative receptive aphasia.  .     Data Reviewed: Care during the described time interval was provided by me .  I have reviewed this patient's available data, including medical history, events of note, physical examination, and all test results as part of my evaluation.   CBC: Recent Labs  Lab 05/17/17 0440 05/19/17 0206  WBC 8.3 12.0*  NEUTROABS 4.8  --   HGB 13.9 14.8  HCT 41.9 43.0  MCV 89.5 89.4  PLT 170 189   Basic Metabolic Panel: Recent Labs  Lab 05/17/17 0440 05/19/17 0206  NA 138 139  K 3.3* 3.4*  CL 108 108  CO2 19* 16*  GLUCOSE 112* 165*  BUN 9 15  CREATININE 0.76 0.79  CALCIUM 9.2 9.1   GFR: Estimated Creatinine Clearance: 83.8 mL/min (by C-G formula based on SCr of 0.79 mg/dL). Liver Function Tests: Recent Labs  Lab 05/17/17 0440 05/19/17 0206  AST 34 30  ALT 17 16*  ALKPHOS 83 87  BILITOT 0.6 0.7  PROT 6.5 7.2  ALBUMIN 3.9 4.1   Recent Labs  Lab 05/17/17 0440 05/19/17 0206  LIPASE 32 20   No results for input(s): AMMONIA in the last 168 hours. Coagulation Profile: No results for input(s): INR, PROTIME in the last 168 hours. Cardiac Enzymes: No results for input(s): CKTOTAL, CKMB, CKMBINDEX, TROPONINI in the last 168 hours. BNP (last 3 results) No results for input(s): PROBNP in the last 8760 hours. HbA1C: No results for input(s): HGBA1C in the last 72 hours. CBG: No results for input(s): GLUCAP in the last 168 hours. Lipid Profile: No results for input(s): CHOL, HDL, LDLCALC, TRIG, CHOLHDL, LDLDIRECT in the last 72  hours. Thyroid Function Tests: No results for input(s): TSH, T4TOTAL, FREET4, T3FREE, THYROIDAB in the last 72 hours. Anemia Panel: No results for input(s): VITAMINB12, FOLATE, FERRITIN, TIBC, IRON, RETICCTPCT in the last 72 hours. Urine analysis:    Component Value Date/Time   COLORURINE YELLOW 05/19/2017 0148   APPEARANCEUR CLEAR 05/19/2017 0148   LABSPEC 1.032 (H) 05/19/2017 0148   PHURINE 5.0 05/19/2017 0148   GLUCOSEU NEGATIVE 05/19/2017 0148   HGBUR NEGATIVE 05/19/2017 0148   BILIRUBINUR SMALL (A) 05/19/2017 0148   KETONESUR 80 (A) 05/19/2017 0148   PROTEINUR 30 (A) 05/19/2017 0148   UROBILINOGEN 0.2 11/04/2012 1226   NITRITE NEGATIVE 05/19/2017 0148   LEUKOCYTESUR NEGATIVE 05/19/2017 0148   Sepsis Labs: @LABRCNTIP (procalcitonin:4,lacticidven:4)  ) Recent Results (from the past 240 hour(s))  MRSA PCR Screening     Status: Abnormal   Collection Time: 05/19/17 11:26 PM  Result Value Ref Range Status   MRSA by PCR POSITIVE (A) NEGATIVE Final    Comment:        The GeneXpert MRSA Assay (FDA approved for NASAL specimens only), is one component of  a comprehensive MRSA colonization surveillance program. It is not intended to diagnose MRSA infection nor to guide or monitor treatment for MRSA infections. RESULT CALLED TO, READ BACK BY AND VERIFIED WITHEvette Georges: K LOWERY RN [phone] 05/20/17 A BROWNING          Radiology Studies: Dg Chest 2 View  Result Date: 05/19/2017 CLINICAL DATA:  Shortness of breath and right-sided chest pain EXAM: CHEST  2 VIEW COMPARISON:  Chest radiograph 01/12/2017 and 12/03/2016 CT abdomen pelvis 01/12/2017 and 12/03/2016 FINDINGS: There is a large right pneumothorax with apical and lateral components occupying at least 50% of the right hemithoracic volume. There is no leftward shift of the mediastinal structures. There is scarring at the left lung base, likely secondary to emphysema. Pons old CTs of 12/03/2016 and 01/12/2017, there are large bullae  demonstrated at the right lung base. However, the lateral and posterior basal segments of the right lower lobe or normally inflated at that time and the radiographic appearance was essentially normal. There has been a marked interval change, which may be secondary to a right rupture of a bulla. IMPRESSION: Large right pneumothorax, occupying approximately 50% the right hemithoracic volume. Given the patient's known bullous emphysema, this is probably secondary to rupture of a bulla. No mediastinal shift. Critical Value/emergent results were called by telephone at the time of interpretation on 05/19/2017 at 5:17 am to [place]az[place]JORDAN ROBINSON , who verbally acknowledged these results. Electronically Signed   By: Deatra RobinsonKevin  Herman M.D.   On: 05/19/2017 05:17   Dg Chest 1v Repeat Same Day  Result Date: 05/19/2017 CLINICAL DATA:  Right chest tube placement. EXAM: CHEST - 1 VIEW SAME DAY COMPARISON:  05/19/2017 at 0457 hours FINDINGS: The cardiomediastinal silhouette is unchanged. The lungs are hyperinflated with underlying emphysema. A right chest tube has been placed and terminates at the lung apex. There is a small residual right-sided pneumothorax, most conspicuous laterally and greatly decreased in size from the prior study with re-expansion of the right lung. No airspace consolidation or large pleural effusion is seen. IMPRESSION: Small residual right pneumothorax following chest tube placement. Electronically Signed   By: Sebastian AcheAllen  Grady M.D.   On: 05/19/2017 07:41   Dg Chest Port 1 View  Result Date: 05/19/2017 CLINICAL DATA:  Chest tube in place.  Increased chest pressure. EXAM: PORTABLE CHEST 1 VIEW COMPARISON:  Radiographs earlier this day most recently 0716 hour FINDINGS: Right chest tube remains in place. Decreased size of right basal lateral pneumothorax, small residual. Increasing atelectasis at the lung bases, no evidence of re-expansion pulmonary edema. No pleural fluid. Unchanged heart size and mediastinal  contours. IMPRESSION: Right chest tube in place with decreased size of residual pneumothorax from prior, with small persistent inferolateral component. Increasing atelectasis. Otherwise no change. Electronically Signed   By: Rubye OaksMelanie  Ehinger M.D.   On: 05/19/2017 23:45        Scheduled Meds: . atorvastatin  40 mg Oral QHS  . Chlorhexidine Gluconate Cloth  6 each Topical Q0600  . ipratropium-albuterol  3 mL Nebulization Q6H  . mupirocin ointment  1 application Nasal BID  . nicotine  14 mg Transdermal Daily  . pantoprazole  40 mg Oral Daily   Continuous Infusions: . 0.9 % NaCl with KCl 20 mEq / L 75 mL/hr (05/20/17 0407)     LOS: 1 day    Time spent: 40 minutes    WOODS, Roselind MessierURTIS J, MD [facility] Pager [phone]   If 7PM-7AM, please contact night-coverage [email or web address] Password [email or web address] 05/20/2017, 8:54  AM

## 2017-05-20 NOTE — Progress Notes (Addendum)
Called by RN for "second set of eyes". Patient has a chest tube that was placed for a spontaneous PTX today [facility]  Per RN, patient stated the he has some pressure in his chest which was not present earlier. I asked the RN if the chest tube was intact, make sure the seals and dressings were intact, make sure it was to suction (if that was ordered), check for subcutaneous air and that I would as soon as I could. I also asked that RNs call primary service for chest xray.   When I arrived, patient was resting the bed, stated that he had just the xray taken.  Not in respiratory distress, bilateral breath sounds, equal chest rise, no air leak, connections were secure and I applied a second dressing to archor the chest tube a little closer to the patient.   Chest Xray: right chest tube in place with decreased size of residual pneumothorax from prior, with small persistent inferolateral component. Increasing atelectasis.   I educated the patient that chest pressure and pain from the chest tube is to be expected, patient does have pain medications ordered as well. I offered that sometimes with repositioning, chest pressure can be alleviated. VSS per RNs  NO RRT Interventions.  End Time 2330

## 2017-05-20 NOTE — Plan of Care (Signed)
Discussed adding a stool softener to avoid constipation while on narcotic pain medication.

## 2017-05-21 ENCOUNTER — Inpatient Hospital Stay (HOSPITAL_COMMUNITY): Payer: Medicaid Other

## 2017-05-21 DIAGNOSIS — J9383 Other pneumothorax: Secondary | ICD-10-CM

## 2017-05-21 LAB — BASIC METABOLIC PANEL
Anion gap: 5 (ref 5–15)
BUN: 5 mg/dL — ABNORMAL LOW (ref 6–20)
CALCIUM: 8.5 mg/dL — AB (ref 8.9–10.3)
CO2: 25 mmol/L (ref 22–32)
CREATININE: 0.65 mg/dL (ref 0.61–1.24)
Chloride: 108 mmol/L (ref 101–111)
Glucose, Bld: 90 mg/dL (ref 65–99)
Potassium: 3.5 mmol/L (ref 3.5–5.1)
SODIUM: 138 mmol/L (ref 135–145)

## 2017-05-21 LAB — MAGNESIUM: MAGNESIUM: 1.5 mg/dL — AB (ref 1.7–2.4)

## 2017-05-21 MED ORDER — POTASSIUM CHLORIDE CRYS ER 20 MEQ PO TBCR
50.0000 meq | EXTENDED_RELEASE_TABLET | Freq: Once | ORAL | Status: AC
  Administered 2017-05-21: 50 meq via ORAL
  Filled 2017-05-21: qty 1

## 2017-05-21 MED ORDER — MAGNESIUM SULFATE 2 GM/50ML IV SOLN
2.0000 g | Freq: Once | INTRAVENOUS | Status: AC
  Administered 2017-05-21: 2 g via INTRAVENOUS
  Filled 2017-05-21: qty 50

## 2017-05-21 NOTE — Progress Notes (Addendum)
      [address]       [place] [id number]             [phone]         Subjective: Pain is moderately controlled. Was up in the chair for a good while yesterday.   Objective: Vital signs in last 24 hours: Temp:  [97.4 F (36.3 C)-98.4 F (36.9 C)] 97.7 F (36.5 C) (11/28 0809) Pulse Rate:  [74-92] 79 (11/28 0809) Cardiac Rhythm: Normal sinus rhythm (11/28 0810) Resp:  [14-24] 21 (11/28 0809) BP: (100-118)/(61-79) 114/69 (11/28 0809) SpO2:  [96 %-100 %] 99 % (11/28 0809) FiO2 (%):  [97 %] 97 % (11/27 1422) Weight:  [122 lb 5.7 oz (55.5 kg)] 122 lb 5.7 oz (55.5 kg) (11/28 0343)     Intake/Output from previous day: 11/27 0701 - 11/28 0700 In: 3291.5 [P.O.:1284; I.V.:2007.5] Out: 1259 [Urine:1225; Chest Tube:34] Intake/Output this shift: No intake/output data recorded.  General appearance: alert, cooperative and no distress Heart: regular rate and rhythm, S1, S2 normal, no murmur, click, rub or gallop Lungs: clear to auscultation bilaterally Abdomen: normal bowel sounds Extremities: extremities normal, atraumatic, no cyanosis or edema Wound: chest tube in good location  Lab Results: Recent Labs    05/19/17 0206 05/20/17 1514  WBC 12.0* 7.7  HGB 14.8 13.5  HCT 43.0 39.7  PLT 189 PLATELET CLUMPS NOTED ON SMEAR, COUNT APPEARS DECREASED   BMET:  Recent Labs    05/20/17 1514 05/21/17 0417  NA 138 138  K 3.1* 3.5  CL 109 108  CO2 20* 25  GLUCOSE 129* 90  BUN 11 5*  CREATININE 0.67 0.65  CALCIUM 8.3* 8.5*    PT/INR: No results for input(s): LABPROT, INR in the last 72 hours. ABG    Component Value Date/Time   TCO2 16 12/05/2009 1124   CBG (last 3)  No results for input(s): GLUCAP in the last 72 hours.  Assessment/Plan: S/P chest tube placement.   1. Chest tube placement on 11/26  for right spontaneous pneumothorax. Last CXR 11/28 showed: stable CXR, await official read 2. No air leak 3. Pain- currently on Oxy 10mg  tabs q3 and  Fentanyl 25-1450mcg q2 hours PRN. Pain is moderately controlled.   Plan: continue chest tube and ordered a CXR for the morning. Possibly switch to water seal.  Work on pain control. Ambulate around the room.   Will change chest tube to water seal since there is no pneumothorax on CXR. Will review CXR in the morning.     LOS: 2 days    Sharlene Doryessa N Conte 05/21/2017  I have seen and examined the patient and agree with the assessment and plan as outlined.  Chest tube to water seal.  I spent in excess of 15 minutes during the conduct of this hospital encounter and >50% of this time involved direct face-to-face encounter with the patient for counseling and/or coordination of their care.   Purcell Nailslarence H Gamal Todisco, MD 05/21/2017 1:29 PM

## 2017-05-21 NOTE — Progress Notes (Signed)
PROGRESS NOTE    [name]  ZOX:[id number]:[id number] DOB: 06-15-1961 DOA: 05/19/2017 PCP: Patient, No Pcp Per   Brief Narrative:  56 y.o. WM PMHx   Depression, COPD/emphysema, active smoker not on home oxygen, EtOH abuse, EtOH Chronic Pancreatitis ( has not been drinking for last 10 years), Duodenitis , Chronic Abdominal Pain, seen in ER 2 days ago for abdominal pain and nausea when patient was discharged with pain medications   Presented with worsening right-sided chest, epigastric pain associated with shortness of breath.  Patient reported that his symptoms are worsening for last 2 days to the point that he could not breathe well.  The right-sided chest pain is sharp, no radiation.  No associated with breathing or activity as per patient.  Denied fever, chills, headache, dizziness, dysuria, urgency.  He has chronic loose bowel movement and chronic cough which has not changed as per patient.   ED Course: In the ER patient was found to have hypoxia, right-sided pneumothorax.  The chest tube was placed by cardiothoracic surgeon and [facility]  Patient reported some discomfort at the site of chest tube.    Subjective: 11/28 A/O 4, negative CP, positive SOB, positive chest wall pain (RIGHT side around CT). Negative abdominal pain, negative N/V,   .   Assessment & Plan:   Active Problems:   Spontaneous pneumothorax  Acute spontaneous pneumothorax -Most likely secondary to ruptured bullous emphysema: Negative mediastinal shift -Cardiothoracic surgery placed right side chest tube -Chest tube to waterseal. PCXR pending for 11/29   COPD -Patient continues to be active smoker. Counseled patient extensively on sequela of continuing to smoke to include worsening COPD, increased future risk of spontaneous pneumothorax, MI, CVA, DEATH.  -Nicotine patch -  DuoNeb QID -After chest tube removal will consider transitioning patient to Pulmicort and  Spiriva prior to discharge.  Acute respiratory failure with hypoxia -Secondary to spontaneous pneumothorax. -Titrate O2 to maintain SPO2 89-93%   Hypokalemia - K-Dur 50 mEq -0.9% saline + KCl 20 mEq @ 5775ml/hr  Hypomagnesemia -Magnesium IV 2 g   Chronic Pancreatitis/chronic abdominal pain -Continue current pain regimen      DVT prophylaxis: SCD Code Status: Full Family Communication: None Disposition Plan: TBD     Consultants:  Cardiothoracic surgery    Procedures/Significant Events:  Right chest tube 11/26>>    I have personally reviewed and interpreted all radiology studies and my findings are as above.  VENTILATOR SETTINGS:    Cultures   Antimicrobials:    Devices    LINES / TUBES:  RIGHT chest tube 11/26>>    Continuous Infusions: . 0.9 % NaCl with KCl 20 mEq / L 75 mL/hr at 05/21/17 1614     Objective: Vitals:   05/21/17 0809 05/21/17 1100 05/21/17 1440 05/21/17 1500  BP: 114/69 104/70  108/79  Pulse: 79 94  90  Resp: (!) 21 16  19   Temp: 97.7 F (36.5 C) 98.6 F (37 C)  97.8 F (36.6 C)  TempSrc: Oral Axillary  Axillary  SpO2: 99% 93% 94% 96%  Weight:      Height:        Intake/Output Summary (Last 24 hours) at 05/21/2017 1831 Last data filed at 05/21/2017 1700 Gross per 24 hour  Intake 3324 ml  Output 1859 ml  Net 1465 ml   Filed Weights   05/19/17 2035 05/20/17 0354 05/21/17 0343  Weight: 122 lb 9.2 oz (55.6 kg) 123 lb 10.9 oz (56.1 kg)  122 lb 5.7 oz (55.5 kg)     Examination:  General: A/O 4,No acute respiratory distress, cachectic Neck:  Negative scars, masses, torticollis, lymphadenopathy, JVD Lungs: poor air movement in all lung fields, negative crackles or wheezes Cardiovascular: Regular rate and rhythm without murmur gallop or rub normal S1 and S2, right side chest tube covered and clean negative air leak The wall suction. Abdomen: negative abdominal pain, nondistended, positive soft, bowel sounds, no  rebound, no ascites, no appreciable mass Extremities: No significant cyanosis, clubbing, or edema bilateral lower extremities Skin: Negative rashes, lesions, ulcers Psychiatric:  Negative depression, negative anxiety, negative fatigue, negative mania  Central nervous system:  Cranial nerves II through XII intact, tongue/uvula midline, all extremities muscle strength 5/5, sensation intact throughout, negative dysarthria, negative expressive aphasia, negative receptive aphasia.  .     Data Reviewed: Care during the described time interval was provided by me .  I have reviewed this patient's available data, including medical history, events of note, physical examination, and all test results as part of my evaluation.   CBC: Recent Labs  Lab 05/17/17 0440 05/19/17 0206 05/20/17 1514  WBC 8.3 12.0* 7.7  NEUTROABS 4.8  --   --   HGB 13.9 14.8 13.5  HCT 41.9 43.0 39.7  MCV 89.5 89.4 89.4  PLT 170 189 PLATELET CLUMPS NOTED ON SMEAR, COUNT APPEARS DECREASED   Basic Metabolic Panel: Recent Labs  Lab 05/17/17 0440 05/19/17 0206 05/20/17 1514 05/21/17 0417  NA 138 139 138 138  K 3.3* 3.4* 3.1* 3.5  CL 108 108 109 108  CO2 19* 16* 20* 25  GLUCOSE 112* 165* 129* 90  BUN 9 15 11  5*  CREATININE 0.76 0.79 0.67 0.65  CALCIUM 9.2 9.1 8.3* 8.5*  MG  --   --   --  1.5*   GFR: Estimated Creatinine Clearance: 82.9 mL/min (by C-G formula based on SCr of 0.65 mg/dL). Liver Function Tests: Recent Labs  Lab 05/17/17 0440 05/19/17 0206  AST 34 30  ALT 17 16*  ALKPHOS 83 87  BILITOT 0.6 0.7  PROT 6.5 7.2  ALBUMIN 3.9 4.1   Recent Labs  Lab 05/17/17 0440 05/19/17 0206  LIPASE 32 20   No results for input(s): AMMONIA in the last 168 hours. Coagulation Profile: No results for input(s): INR, PROTIME in the last 168 hours. Cardiac Enzymes: No results for input(s): CKTOTAL, CKMB, CKMBINDEX, TROPONINI in the last 168 hours. BNP (last 3 results) No results for input(s): PROBNP in the  last 8760 hours. HbA1C: No results for input(s): HGBA1C in the last 72 hours. CBG: No results for input(s): GLUCAP in the last 168 hours. Lipid Profile: No results for input(s): CHOL, HDL, LDLCALC, TRIG, CHOLHDL, LDLDIRECT in the last 72 hours. Thyroid Function Tests: No results for input(s): TSH, T4TOTAL, FREET4, T3FREE, THYROIDAB in the last 72 hours. Anemia Panel: No results for input(s): VITAMINB12, FOLATE, FERRITIN, TIBC, IRON, RETICCTPCT in the last 72 hours. Urine analysis:    Component Value Date/Time   COLORURINE YELLOW 05/19/2017 0148   APPEARANCEUR CLEAR 05/19/2017 0148   LABSPEC 1.032 (H) 05/19/2017 0148   PHURINE 5.0 05/19/2017 0148   GLUCOSEU NEGATIVE 05/19/2017 0148   HGBUR NEGATIVE 05/19/2017 0148   BILIRUBINUR SMALL (A) 05/19/2017 0148   KETONESUR 80 (A) 05/19/2017 0148   PROTEINUR 30 (A) 05/19/2017 0148   UROBILINOGEN 0.2 11/04/2012 1226   NITRITE NEGATIVE 05/19/2017 0148   LEUKOCYTESUR NEGATIVE 05/19/2017 0148   Sepsis Labs: @LABRCNTIP (procalcitonin:4,lacticidven:4)  ) Recent Results (from  the past 240 hour(s))  MRSA PCR Screening     Status: Abnormal   Collection Time: 05/19/17 11:26 PM  Result Value Ref Range Status   MRSA by PCR POSITIVE (A) NEGATIVE Final    Comment:        The GeneXpert MRSA Assay (FDA approved for NASAL specimens only), is one component of a comprehensive MRSA colonization surveillance program. It is not intended to diagnose MRSA infection nor to guide or monitor treatment for MRSA infections. RESULT CALLED TO, READ BACK BY AND VERIFIED WITHEvette Georges: K LOWERY RN [id number] 05/20/17 A BROWNING          Radiology Studies: Dg Chest Port 1 View  Result Date: 05/21/2017 CLINICAL DATA:  RIGHT thoracostomy tube EXAM: PORTABLE CHEST 1 VIEW COMPARISON:  Portable exam 0718 hours compared to 05/20/2017) earlier study of 12/03/2016 FINDINGS: RIGHT thoracostomy tube extending to RIGHT apex. Normal heart size, mediastinal contours, and  pulmonary vascularity. Bibasilar atelectasis and suspect component of chronic interstitial disease. RIGHT costophrenic angle excluded. Underlying emphysematous changes. No acute infiltrate, pleural effusion or definite pneumothorax. No acute osseous findings. IMPRESSION: No pneumothorax post thoracostomy tube placement. Emphysematous changes with bibasilar atelectasis and probable component of chronic interstitial lung disease. Electronically Signed   By: Ulyses SouthwardMark  Boles M.D.   On: 05/21/2017 09:02   Dg Chest Port 1 View  Result Date: 05/20/2017 CLINICAL DATA:  Chest tube EXAM: PORTABLE CHEST 1 VIEW COMPARISON:  05/19/2017 FINDINGS: Right chest tube remains in place. Right basilar pneumothorax again noted, unchanged. Small right effusion. Left base scarring. There is hyperinflation of the lungs compatible with COPD. Heart is normal size. IMPRESSION: Right chest tube remains in place with small residual right basilar hydropneumothorax. COPD. Electronically Signed   By: Charlett NoseKevin  Dover M.D.   On: 05/20/2017 08:54   Dg Chest Port 1 View  Result Date: 05/19/2017 CLINICAL DATA:  Chest tube in place.  Increased chest pressure. EXAM: PORTABLE CHEST 1 VIEW COMPARISON:  Radiographs earlier this day most recently 0716 hour FINDINGS: Right chest tube remains in place. Decreased size of right basal lateral pneumothorax, small residual. Increasing atelectasis at the lung bases, no evidence of re-expansion pulmonary edema. No pleural fluid. Unchanged heart size and mediastinal contours. IMPRESSION: Right chest tube in place with decreased size of residual pneumothorax from prior, with small persistent inferolateral component. Increasing atelectasis. Otherwise no change. Electronically Signed   By: Rubye OaksMelanie  Ehinger M.D.   On: 05/19/2017 23:45        Scheduled Meds: . atorvastatin  40 mg Oral QHS  . Chlorhexidine Gluconate Cloth  6 each Topical Q0600  . ipratropium-albuterol  3 mL Nebulization Q6H  . mupirocin  ointment  1 application Nasal BID  . nicotine  14 mg Transdermal Daily  . pantoprazole  40 mg Oral Daily   Continuous Infusions: . 0.9 % NaCl with KCl 20 mEq / L 75 mL/hr at 05/21/17 1614     LOS: 2 days    Time spent: 40 minutes    WOODS, Roselind MessierURTIS J, MD [facility] Pager [phone]   If 7PM-7AM, please contact night-coverage [email or web address] Password [email or web address] 05/21/2017, 6:31 PM

## 2017-05-22 ENCOUNTER — Inpatient Hospital Stay (HOSPITAL_COMMUNITY): Payer: Medicaid Other

## 2017-05-22 DIAGNOSIS — J9311 Primary spontaneous pneumothorax: Secondary | ICD-10-CM

## 2017-05-22 LAB — MAGNESIUM: MAGNESIUM: 1.7 mg/dL (ref 1.7–2.4)

## 2017-05-22 LAB — BASIC METABOLIC PANEL
ANION GAP: 8 (ref 5–15)
BUN: <5 mg/dL — ABNORMAL LOW (ref 6–20)
CALCIUM: 8.7 mg/dL — AB (ref 8.9–10.3)
CO2: 22 mmol/L (ref 22–32)
Chloride: 110 mmol/L (ref 101–111)
Creatinine, Ser: 0.7 mg/dL (ref 0.61–1.24)
GLUCOSE: 110 mg/dL — AB (ref 65–99)
POTASSIUM: 3.9 mmol/L (ref 3.5–5.1)
SODIUM: 140 mmol/L (ref 135–145)

## 2017-05-22 LAB — PHOSPHORUS: PHOSPHORUS: 2.4 mg/dL — AB (ref 2.5–4.6)

## 2017-05-22 MED ORDER — ALBUTEROL SULFATE (2.5 MG/3ML) 0.083% IN NEBU: 2.5000 mg | INHALATION_SOLUTION | Freq: Four times a day (QID) | RESPIRATORY_TRACT | Status: DC | PRN

## 2017-05-22 MED ORDER — IPRATROPIUM-ALBUTEROL 0.5-2.5 (3) MG/3ML IN SOLN
3.0000 mL | Freq: Three times a day (TID) | RESPIRATORY_TRACT | Status: DC
  Administered 2017-05-23 – 2017-05-24 (×4): 3 mL via RESPIRATORY_TRACT
  Filled 2017-05-22 (×5): qty 3

## 2017-05-22 NOTE — Discharge Instructions (Signed)

## 2017-05-22 NOTE — Progress Notes (Signed)
Respiratory assessment done and patient scored a 6. Changed patient to TID treatments for now and patient will be reassesses per RT protocol.

## 2017-05-22 NOTE — Progress Notes (Signed)
PROGRESS NOTE    [name]  ZOX:[id number] DOB: Jul 27, 1960 DOA: 05/19/2017 PCP: Patient, No Pcp Per   Brief Narrative:  56 y.o. WM PMHx   Depression, COPD/emphysema, active smoker not on home oxygen, EtOH abuse, EtOH Chronic Pancreatitis ( has not been drinking for last 10 years), Duodenitis , Chronic Abdominal Pain, seen in ER 2 days ago for abdominal pain and nausea when patient was discharged with pain medications   Presented with worsening right-sided chest, epigastric pain associated with shortness of breath.  Patient reported that his symptoms are worsening for last 2 days to the point that he could not breathe well.  The right-sided chest pain is sharp, no radiation.  No associated with breathing or activity as per patient.  Denied fever, chills, headache, dizziness, dysuria, urgency.  He has chronic loose bowel movement and chronic cough which has not changed as per patient.   ED Course: In the ER patient was found to have hypoxia, right-sided pneumothorax.  The chest tube was placed by cardiothoracic surgeon and recommended to admit under medical service at [facility].  Patient reported some discomfort at the site of chest tube.    Subjective: 11/29 A/O 4, negative CP, positive SOB, positive chest wall pain (RIGHT side around CT). Negative abdominal pain, negative N/V,   .   Assessment & Plan:   Active Problems:   Spontaneous pneumothorax  Acute spontaneous pneumothorax -Most likely secondary to ruptured bullous emphysema: Negative mediastinal shift -Cardiothoracic surgery placed right side chest tube -Chest tube to waterseal. Per cardiothoracic surgery possible CT removal on 11/30   COPD -Patient continues to be active smoker. Counseled patient extensively on sequela of continuing to smoke to include worsening COPD, increased future risk of spontaneous pneumothorax, MI, CVA, DEATH.  -Nicotine patch -  DuoNeb QID -After chest tube removal will consider  transitioning patient to Pulmicort and Spiriva prior to discharge.  Acute respiratory failure with hypoxia -Secondary to spontaneous pneumothorax. -Titrate O2 to maintain SPO2 89-93%   Hypokalemia - K-Dur 50 mEq -0.9% saline + KCl 20 mEq @ 9ml/hr  Hypomagnesemia -Magnesium IV 2 g   Chronic Pancreatitis/chronic abdominal pain -Continue current pain regimen      DVT prophylaxis: SCD Code Status: Full Family Communication: None Disposition Plan: TBD     Consultants:  Cardiothoracic surgery    Procedures/Significant Events:  Right chest tube 11/26>>    I have personally reviewed and interpreted all radiology studies and my findings are as above.  VENTILATOR SETTINGS:    Cultures   Antimicrobials:    Devices    LINES / TUBES:  RIGHT chest tube 11/26>>    Continuous Infusions: . 0.9 % NaCl with KCl 20 mEq / L 75 mL/hr at 05/22/17 2040     Objective: Vitals:   05/22/17 0807 05/22/17 1500 05/22/17 1634 05/22/17 2009  BP: 131/72 113/70 113/70   Pulse: 81 94 93   Resp: 16 14 17    Temp: 98.2 F (36.8 C) 98.2 F (36.8 C) 98.2 F (36.8 C)   TempSrc: Oral Oral Oral   SpO2: 96% 92% 92% 95%  Weight:      Height:        Intake/Output Summary (Last 24 hours) at 05/22/2017 2140 Last data filed at 05/22/2017 1900 Gross per 24 hour  Intake 2960 ml  Output 3389 ml  Net -429 ml   Filed Weights   05/20/17 0354 05/21/17 0343 05/22/17 0459  Weight: 123 lb 10.9 oz (56.1 kg) 122 lb 5.7  oz (55.5 kg) 125 lb 14.1 oz (57.1 kg)     Examination:  General: A/O 4,No acute respiratory distress, cachectic Neck:  Negative scars, masses, torticollis, lymphadenopathy, JVD Lungs: poor air movement in all lung fields, negative crackles or wheezes Cardiovascular: Regular rate and rhythm without murmur gallop or rub normal S1 and S2, right side chest tube covered and clean negative air leak The wall suction. Abdomen: negative abdominal pain, nondistended,  positive soft, bowel sounds, no rebound, no ascites, no appreciable mass Extremities: No significant cyanosis, clubbing, or edema bilateral lower extremities Skin: Negative rashes, lesions, ulcers Psychiatric:  Negative depression, negative anxiety, negative fatigue, negative mania  Central nervous system:  Cranial nerves II through XII intact, tongue/uvula midline, all extremities muscle strength 5/5, sensation intact throughout, negative dysarthria, negative expressive aphasia, negative receptive aphasia.  .     Data Reviewed: Care during the described time interval was provided by me .  I have reviewed this patient's available data, including medical history, events of note, physical examination, and all test results as part of my evaluation.   CBC: Recent Labs  Lab 05/17/17 0440 05/19/17 0206 05/20/17 1514  WBC 8.3 12.0* 7.7  NEUTROABS 4.8  --   --   HGB 13.9 14.8 13.5  HCT 41.9 43.0 39.7  MCV 89.5 89.4 89.4  PLT 170 189 PLATELET CLUMPS NOTED ON SMEAR, COUNT APPEARS DECREASED   Basic Metabolic Panel: Recent Labs  Lab 05/17/17 0440 05/19/17 0206 05/20/17 1514 05/21/17 0417 05/22/17 0332  NA 138 139 138 138 140  K 3.3* 3.4* 3.1* 3.5 3.9  CL 108 108 109 108 110  CO2 19* 16* 20* 25 22  GLUCOSE 112* 165* 129* 90 110*  BUN 9 15 11  5* <5*  CREATININE 0.76 0.79 0.67 0.65 0.70  CALCIUM 9.2 9.1 8.3* 8.5* 8.7*  MG  --   --   --  1.5* 1.7  PHOS  --   --   --   --  2.4*   GFR: Estimated Creatinine Clearance: 85.3 mL/min (by C-G formula based on SCr of 0.7 mg/dL). Liver Function Tests: Recent Labs  Lab 05/17/17 0440 05/19/17 0206  AST 34 30  ALT 17 16*  ALKPHOS 83 87  BILITOT 0.6 0.7  PROT 6.5 7.2  ALBUMIN 3.9 4.1   Recent Labs  Lab 05/17/17 0440 05/19/17 0206  LIPASE 32 20   No results for input(s): AMMONIA in the last 168 hours. Coagulation Profile: No results for input(s): INR, PROTIME in the last 168 hours. Cardiac Enzymes: No results for input(s):  CKTOTAL, CKMB, CKMBINDEX, TROPONINI in the last 168 hours. BNP (last 3 results) No results for input(s): PROBNP in the last 8760 hours. HbA1C: No results for input(s): HGBA1C in the last 72 hours. CBG: No results for input(s): GLUCAP in the last 168 hours. Lipid Profile: No results for input(s): CHOL, HDL, LDLCALC, TRIG, CHOLHDL, LDLDIRECT in the last 72 hours. Thyroid Function Tests: No results for input(s): TSH, T4TOTAL, FREET4, T3FREE, THYROIDAB in the last 72 hours. Anemia Panel: No results for input(s): VITAMINB12, FOLATE, FERRITIN, TIBC, IRON, RETICCTPCT in the last 72 hours. Urine analysis:    Component Value Date/Time   COLORURINE YELLOW 05/19/2017 0148   APPEARANCEUR CLEAR 05/19/2017 0148   LABSPEC 1.032 (H) 05/19/2017 0148   PHURINE 5.0 05/19/2017 0148   GLUCOSEU NEGATIVE 05/19/2017 0148   HGBUR NEGATIVE 05/19/2017 0148   BILIRUBINUR SMALL (A) 05/19/2017 0148   KETONESUR 80 (A) 05/19/2017 0148   PROTEINUR 30 (A) 05/19/2017  0148   UROBILINOGEN 0.2 11/04/2012 1226   NITRITE NEGATIVE 05/19/2017 0148   LEUKOCYTESUR NEGATIVE 05/19/2017 0148   Sepsis Labs: @LABRCNTIP (procalcitonin:4,lacticidven:4)  ) Recent Results (from the past 240 hour(s))  MRSA PCR Screening     Status: Abnormal   Collection Time: 05/19/17 11:26 PM  Result Value Ref Range Status   MRSA by PCR POSITIVE (A) NEGATIVE Final    Comment:        The GeneXpert MRSA Assay (FDA approved for NASAL specimens only), is one component of a comprehensive MRSA colonization surveillance program. It is not intended to diagnose MRSA infection nor to guide or monitor treatment for MRSA infections. RESULT CALLED TO, READ BACK BY AND VERIFIED WITHEvette Georges: K LOWERY RN [id number] 05/20/17 A BROWNING          Radiology Studies: Dg Chest Port 1 View  Result Date: 05/22/2017 CLINICAL DATA:  56 year old male with a history of chest 2 EXAM: PORTABLE CHEST 1 VIEW COMPARISON:  05/21/2017 FINDINGS: Cardiomediastinal silhouette  unchanged in size and contour. Unchanged right large-bore thoracostomy tube terminating at the apex. Questionable residual small anterior pneumothorax adjacent to the chest tube. Changes of emphysema again noted chronic interstitial opacities bilaterally. No confluent airspace disease. No displaced fracture. IMPRESSION: Unchanged right-sided thoracostomy tube with questionable small residual anterior pneumothorax adjacent to the chest tube. Chronic lung changes. Electronically Signed   By: Gilmer MorJaime  Wagner D.O.   On: 05/22/2017 08:39   Dg Chest Port 1 View  Result Date: 05/21/2017 CLINICAL DATA:  RIGHT thoracostomy tube EXAM: PORTABLE CHEST 1 VIEW COMPARISON:  Portable exam 0718 hours compared to 05/20/2017) earlier study of 12/03/2016 FINDINGS: RIGHT thoracostomy tube extending to RIGHT apex. Normal heart size, mediastinal contours, and pulmonary vascularity. Bibasilar atelectasis and suspect component of chronic interstitial disease. RIGHT costophrenic angle excluded. Underlying emphysematous changes. No acute infiltrate, pleural effusion or definite pneumothorax. No acute osseous findings. IMPRESSION: No pneumothorax post thoracostomy tube placement. Emphysematous changes with bibasilar atelectasis and probable component of chronic interstitial lung disease. Electronically Signed   By: Ulyses SouthwardMark  Boles M.D.   On: 05/21/2017 09:02        Scheduled Meds: . atorvastatin  40 mg Oral QHS  . Chlorhexidine Gluconate Cloth  6 each Topical Q0600  . [START ON 05/23/2017] ipratropium-albuterol  3 mL Nebulization TID  . mupirocin ointment  1 application Nasal BID  . nicotine  14 mg Transdermal Daily  . pantoprazole  40 mg Oral Daily   Continuous Infusions: . 0.9 % NaCl with KCl 20 mEq / L 75 mL/hr at 05/22/17 2040     LOS: 3 days    Time spent: 40 minutes    WOODS, Roselind MessierURTIS J, MD [facility] Pager [phone]   If 7PM-7AM, please contact night-coverage [email or web address] Password  [email or web address] 05/22/2017, 9:40 PM

## 2017-05-22 NOTE — Progress Notes (Addendum)
      [address]       [place] [id number]             [phone]           Subjective: Patient has pain at chest tube site, but has had since insertion. Fentanyl helps "some"  Objective: Vital signs in last 24 hours: Temp:  [97.7 F (36.5 C)-98.6 F (37 C)] 98.1 F (36.7 C) (11/29 0459) Pulse Rate:  [79-94] 85 (11/29 0459) Cardiac Rhythm: Normal sinus rhythm (11/29 0700) Resp:  [15-21] 15 (11/29 0459) BP: (98-121)/(66-84) 98/66 (11/29 0459) SpO2:  [92 %-99 %] 96 % (11/29 0807) Weight:  [125 lb 14.1 oz (57.1 kg)] 125 lb 14.1 oz (57.1 kg) (11/29 0459)     Intake/Output from previous day: 11/28 0701 - 11/29 0700 In: 3013.8 [P.O.:1280; I.V.:1733.8] Out: 2599 [Urine:2575; Chest Tube:24]   Physical Exam:  Cardiovascular: RRR Pulmonary: Clear to auscultation bilaterally Wound: Dressing is clean and dry Chest Tube: to water seal , no air leak  Lab Results: CBC: Recent Labs    05/20/17 1514  WBC 7.7  HGB 13.5  HCT 39.7  PLT PLATELET CLUMPS NOTED ON SMEAR, COUNT APPEARS DECREASED   BMET:  Recent Labs    05/21/17 0417 05/22/17 0332  NA 138 140  K 3.5 3.9  CL 108 110  CO2 25 22  GLUCOSE 90 110*  BUN 5* <5*  CREATININE 0.65 0.70  CALCIUM 8.5* 8.7*    PT/INR: No results for input(s): LABPROT, INR in the last 72 hours. ABG:  INR: Will add last result for INR, ABG once components are confirmed Will add last 4 CBG results once components are confirmed  Assessment/Plan:  1. CV - SR 2.  Pulmonary - History of COPD. Right chest tube with scant output last 24 hours. Chest tube is to water seal and there is no air leak. CXR this am appears stable. Will either remove chest tube later today or in am. Check CXR in am.   Donielle M ZimmermanPA-C 05/22/2017,8:08 AM   I have seen and examined the patient and agree with the assessment and plan as outlined.  Will keep tube one more day and plan to remove it tomorrow if he remains stable.  I  spent in excess of 15 minutes during the conduct of this hospital encounter and >50% of this time involved direct face-to-face encounter with the patient for counseling and/or coordination of their care.   Purcell Nailslarence H Mikel Hardgrove, MD 05/22/2017 1:08 PM

## 2017-05-23 ENCOUNTER — Inpatient Hospital Stay (HOSPITAL_COMMUNITY): Payer: Medicaid Other

## 2017-05-23 LAB — BASIC METABOLIC PANEL
Anion gap: 8 (ref 5–15)
CALCIUM: 8.7 mg/dL — AB (ref 8.9–10.3)
CO2: 24 mmol/L (ref 22–32)
CREATININE: 0.71 mg/dL (ref 0.61–1.24)
Chloride: 103 mmol/L (ref 101–111)
GFR calc non Af Amer: >60 mL/min (ref >60)
Glucose, Bld: 128 mg/dL — ABNORMAL HIGH (ref 65–99)
Potassium: 4 mmol/L (ref 3.5–5.1)
SODIUM: 135 mmol/L (ref 135–145)

## 2017-05-23 LAB — MAGNESIUM: MAGNESIUM: 1.6 mg/dL — AB (ref 1.7–2.4)

## 2017-05-23 MED ORDER — MAGNESIUM SULFATE 50 % IJ SOLN
3.0000 g | Freq: Once | INTRAVENOUS | Status: AC
  Administered 2017-05-24: 3 g via INTRAVENOUS
  Filled 2017-05-23 (×2): qty 6

## 2017-05-23 NOTE — Progress Notes (Signed)
Nurse tech into complete weight, vitals and assist patient with chlorhexidine bath.  Patient refused standing weight and chlorhexidine bath due to pain from chest tube and feeling short of breath.  Vital signs stable (see vitals charted at 0455), PRN pain medication administered at 0452 (see MAR for details).

## 2017-05-23 NOTE — Progress Notes (Signed)
PROGRESS NOTE    [name]  NWG:[id number] DOB: 13-Feb-1961 DOA: 05/19/2017 PCP: Patient, No Pcp Per   Brief Narrative:  56 y.o. WM PMHx   Depression, COPD/emphysema, active smoker not on home oxygen, EtOH abuse, EtOH Chronic Pancreatitis ( has not been drinking for last 10 years), Duodenitis , Chronic Abdominal Pain, seen in ER 2 days ago for abdominal pain and nausea when patient was discharged with pain medications   Presented with worsening right-sided chest, epigastric pain associated with shortness of breath.  Patient reported that his symptoms are worsening for last 2 days to the point that he could not breathe well.  The right-sided chest pain is sharp, no radiation.  No associated with breathing or activity as per patient.  Denied fever, chills, headache, dizziness, dysuria, urgency.  He has chronic loose bowel movement and chronic cough which has not changed as per patient.   ED Course: In the ER patient was found to have hypoxia, right-sided pneumothorax.  The chest tube was placed by cardiothoracic surgeon and recommended to [facility].  Patient reported some discomfort at the site of chest tube.    Subjective: 11/30 A/O 4, negative CP, positive SOB, negative abdominal pain, negative N/V. Decrease CP S/P chest tube removal.    .   Assessment & Plan:   Active Problems:   Spontaneous pneumothorax  Acute spontaneous pneumothorax -Most likely secondary to ruptured bullous emphysema: Negative mediastinal shift -Cardiothoracic surgery placed right side chest tube -11/30 S/P chest tube removal  -12/1 obtain ambulatory SPO2  COPD -Patient continues to be active smoker. Counseled patient extensively on sequela of continuing to smoke to include worsening COPD, increased future risk of spontaneous pneumothorax, MI, CVA, DEATH.  -Nicotine patch -  DuoNeb QID -After chest tube removal will consider transitioning patient to Pulmicort and  Spiriva prior to discharge.  Acute respiratory failure with hypoxia -Secondary to spontaneous pneumothorax. -Titrate O2 to maintain SPO2 89-93%   Hypokalemia - Resolved  Hypomagnesemia -Magnesium IV 3 g   Chronic Pancreatitis/chronic abdominal pain -Continue current pain regimen      DVT prophylaxis: SCD Code Status: Full Family Communication: None Disposition Plan: TBD     Consultants:  Cardiothoracic surgery    Procedures/Significant Events:  Right chest tube 11/26>> 11/30    I have personally reviewed and interpreted all radiology studies and my findings are as above.  VENTILATOR SETTINGS:    Cultures   Antimicrobials:    Devices    LINES / TUBES:  RIGHT chest tube 11/26>>    Continuous Infusions: . 0.9 % NaCl with KCl 20 mEq / L 75 mL/hr at 05/23/17 1201     Objective: Vitals:   05/23/17 0934 05/23/17 1241 05/23/17 1449 05/23/17 1714  BP:  108/70  102/69  Pulse:  94  94  Resp:  16  16  Temp:  98.4 F (36.9 C)  (!) 97.5 F (36.4 C)  TempSrc:  Oral  Axillary  SpO2: 93% 95% 98% 95%  Weight:      Height:        Intake/Output Summary (Last 24 hours) at 05/23/2017 2016 Last data filed at 05/23/2017 1755 Gross per 24 hour  Intake 1866.25 ml  Output 2327 ml  Net -460.75 ml   Filed Weights   05/20/17 0354 05/21/17 0343 05/22/17 0459  Weight: 123 lb 10.9 oz (56.1 kg) 122 lb 5.7 oz (55.5 kg) 125 lb 14.1 oz (57.1 kg)     Examination:  General: A/O 4,No acute respiratory distress, cachectic Neck:  Negative scars, masses, torticollis, lymphadenopathy, JVD Lungs: poor air movement in all lung fields, negative crackles or wheezes Cardiovascular: Regular rate and rhythm without murmur gallop or rub normal S1 and S2, right side thorax covered and clean chest tube removed.  Abdomen: negative abdominal pain, nondistended, positive soft, bowel sounds, no rebound, no ascites, no appreciable mass Extremities: No significant cyanosis,  clubbing, or edema bilateral lower extremities Skin: Negative rashes, lesions, ulcers Psychiatric:  Negative depression, negative anxiety, negative fatigue, negative mania  Central nervous system:  Cranial nerves II through XII intact, tongue/uvula midline, all extremities muscle strength 5/5, sensation intact throughout, negative dysarthria, negative expressive aphasia, negative receptive aphasia.  .     Data Reviewed: Care during the described time interval was provided by me .  I have reviewed this patient's available data, including medical history, events of note, physical examination, and all test results as part of my evaluation.   CBC: Recent Labs  Lab 05/17/17 0440 05/19/17 0206 05/20/17 1514  WBC 8.3 12.0* 7.7  NEUTROABS 4.8  --   --   HGB 13.9 14.8 13.5  HCT 41.9 43.0 39.7  MCV 89.5 89.4 89.4  PLT 170 189 PLATELET CLUMPS NOTED ON SMEAR, COUNT APPEARS DECREASED   Basic Metabolic Panel: Recent Labs  Lab 05/19/17 0206 05/20/17 1514 05/21/17 0417 05/22/17 0332 05/23/17 0356  NA 139 138 138 140 135  K 3.4* 3.1* 3.5 3.9 4.0  CL 108 109 108 110 103  CO2 16* 20* 25 22 24   GLUCOSE 165* 129* 90 110* 128*  BUN 15 11 5* <5* <5*  CREATININE 0.79 0.67 0.65 0.70 0.71  CALCIUM 9.1 8.3* 8.5* 8.7* 8.7*  MG  --   --  1.5* 1.7 1.6*  PHOS  --   --   --  2.4*  --    GFR: Estimated Creatinine Clearance: 85.3 mL/min (by C-G formula based on SCr of 0.71 mg/dL). Liver Function Tests: Recent Labs  Lab 05/17/17 0440 05/19/17 0206  AST 34 30  ALT 17 16*  ALKPHOS 83 87  BILITOT 0.6 0.7  PROT 6.5 7.2  ALBUMIN 3.9 4.1   Recent Labs  Lab 05/17/17 0440 05/19/17 0206  LIPASE 32 20   No results for input(s): AMMONIA in the last 168 hours. Coagulation Profile: No results for input(s): INR, PROTIME in the last 168 hours. Cardiac Enzymes: No results for input(s): CKTOTAL, CKMB, CKMBINDEX, TROPONINI in the last 168 hours. BNP (last 3 results) No results for input(s): PROBNP in  the last 8760 hours. HbA1C: No results for input(s): HGBA1C in the last 72 hours. CBG: No results for input(s): GLUCAP in the last 168 hours. Lipid Profile: No results for input(s): CHOL, HDL, LDLCALC, TRIG, CHOLHDL, LDLDIRECT in the last 72 hours. Thyroid Function Tests: No results for input(s): TSH, T4TOTAL, FREET4, T3FREE, THYROIDAB in the last 72 hours. Anemia Panel: No results for input(s): VITAMINB12, FOLATE, FERRITIN, TIBC, IRON, RETICCTPCT in the last 72 hours. Urine analysis:    Component Value Date/Time   COLORURINE YELLOW 05/19/2017 0148   APPEARANCEUR CLEAR 05/19/2017 0148   LABSPEC 1.032 (H) 05/19/2017 0148   PHURINE 5.0 05/19/2017 0148   GLUCOSEU NEGATIVE 05/19/2017 0148   HGBUR NEGATIVE 05/19/2017 0148   BILIRUBINUR SMALL (A) 05/19/2017 0148   KETONESUR 80 (A) 05/19/2017 0148   PROTEINUR 30 (A) 05/19/2017 0148   UROBILINOGEN 0.2 11/04/2012 1226   NITRITE NEGATIVE 05/19/2017 0148   LEUKOCYTESUR NEGATIVE 05/19/2017 0148  Sepsis Labs: @LABRCNTIP (procalcitonin:4,lacticidven:4)  ) Recent Results (from the past 240 hour(s))  MRSA PCR Screening     Status: Abnormal   Collection Time: 05/19/17 11:26 PM  Result Value Ref Range Status   MRSA by PCR POSITIVE (A) NEGATIVE Final    Comment:        The GeneXpert MRSA Assay (FDA approved for NASAL specimens only), is one component of a comprehensive MRSA colonization surveillance program. It is not intended to diagnose MRSA infection nor to guide or monitor treatment for MRSA infections. RESULT CALLED TO, READ BACK BY AND VERIFIED WITHEvette Georges: K LOWERY RN [id number] 05/20/17 A BROWNING          Radiology Studies: Dg Chest Port 1 View  Result Date: 05/23/2017 CLINICAL DATA:  Shortness of breath, chest tubes EXAM: PORTABLE CHEST 1 VIEW COMPARISON:  05/22/2017 FINDINGS: Right-sided chest tube directed towards the apex. No residual pneumothorax. Hyperinflated lungs as can be seen with COPD. No pleural effusion. No focal  consolidation. Stable cardiomediastinal silhouette. No acute osseous abnormality. IMPRESSION: Right-sided chest tube directed towards the apex. No residual pneumothorax. Electronically Signed   By: Elige KoHetal  Patel   On: 05/23/2017 09:39   Dg Chest Port 1 View  Result Date: 05/22/2017 CLINICAL DATA:  56 year old male with a history of chest 2 EXAM: PORTABLE CHEST 1 VIEW COMPARISON:  05/21/2017 FINDINGS: Cardiomediastinal silhouette unchanged in size and contour. Unchanged right large-bore thoracostomy tube terminating at the apex. Questionable residual small anterior pneumothorax adjacent to the chest tube. Changes of emphysema again noted chronic interstitial opacities bilaterally. No confluent airspace disease. No displaced fracture. IMPRESSION: Unchanged right-sided thoracostomy tube with questionable small residual anterior pneumothorax adjacent to the chest tube. Chronic lung changes. Electronically Signed   By: Gilmer MorJaime  Wagner D.O.   On: 05/22/2017 08:39        Scheduled Meds: . atorvastatin  40 mg Oral QHS  . Chlorhexidine Gluconate Cloth  6 each Topical Q0600  . ipratropium-albuterol  3 mL Nebulization TID  . mupirocin ointment  1 application Nasal BID  . nicotine  14 mg Transdermal Daily  . pantoprazole  40 mg Oral Daily   Continuous Infusions: . 0.9 % NaCl with KCl 20 mEq / L 75 mL/hr at 05/23/17 1201     LOS: 4 days    Time spent: 40 minutes    Henrik Orihuela, Roselind MessierURTIS J, MD [facility] Pager [phone]   If 7PM-7AM, please contact night-coverage [email or web address] Password [email or web address] 05/23/2017, 8:16 PM

## 2017-05-23 NOTE — Progress Notes (Signed)
Night shift RN updated day shift RN of information in previous note during shift report.

## 2017-05-23 NOTE — Progress Notes (Addendum)
      [address]       [place] [id number]             [phone]      Subjective:  Patient complains of pain at chest tube site and shortness of breath.  Objective: Vital signs in last 24 hours: Temp:  [98 F (36.7 C)-98.2 F (36.8 C)] 98.2 F (36.8 C) (11/30 0455) Pulse Rate:  [88-96] 88 (11/30 0455) Cardiac Rhythm: Normal sinus rhythm (11/29 2000) Resp:  [14-18] 16 (11/30 0455) BP: (110-129)/(70-87) 129/87 (11/30 0455) SpO2:  [92 %-96 %] 96 % (11/30 0455)  Intake/Output from previous day: 11/29 0701 - 11/30 0700 In: 2518.8 [P.O.:1200; I.V.:1318.8] Out: 3189 [Urine:3175; Chest Tube:14]  General appearance: alert, cooperative and no distress Heart: regular rate and rhythm Lungs: clear to auscultation bilaterally Abdomen: soft, non-tender; bowel sounds normal; no masses,  no organomegaly Extremities: extremities normal, atraumatic, no cyanosis or edema Wound: clean and dry, no air leak  Lab Results: Recent Labs    05/20/17 1514  WBC 7.7  HGB 13.5  HCT 39.7  PLT PLATELET CLUMPS NOTED ON SMEAR, COUNT APPEARS DECREASED   BMET:  Recent Labs    05/22/17 0332 05/23/17 0356  NA 140 135  K 3.9 4.0  CL 110 103  CO2 22 24  GLUCOSE 110* 128*  BUN <5* <5*  CREATININE 0.70 0.71  CALCIUM 8.7* 8.7*    PT/INR: No results for input(s): LABPROT, INR in the last 72 hours. ABG    Component Value Date/Time   TCO2 16 12/05/2009 1124   CBG (last 3)  No results for input(s): GLUCAP in the last 72 hours.  Assessment/Plan:  1. Spontaneous Pneumothorax- S/P chest tube, no air leak present, CXR appears stable in appearance with questionable lateral pneumothorax which is small 2. Pulm- will order IS, continue pulm toilet, nebs 3. Dispo- care per primary, likely d/c chest tube today, repeat CXR in AM   LOS: 4 days    Erin Barrett 05/23/2017  I have seen and examined the patient and agree with the assessment and plan as outlined.  D/C tube today.   Repeat CXR and possible D/C home tomorrow.  No driving or strenuous activity of any kind for 2 weeks.  I spent in excess of 15 minutes during the conduct of this hospital encounter and >50% of this time involved direct face-to-face encounter with the patient for counseling and/or coordination of their care.   Purcell Nailslarence H Celeste Candelas, MD 05/23/2017 9:24 AM

## 2017-05-23 NOTE — Progress Notes (Signed)
Chest tube removed per protocol.  Pt tolerated well.  Will continue to monitor. 

## 2017-05-24 ENCOUNTER — Inpatient Hospital Stay (HOSPITAL_COMMUNITY): Payer: Medicaid Other

## 2017-05-24 DIAGNOSIS — Z720 Tobacco use: Secondary | ICD-10-CM

## 2017-05-24 DIAGNOSIS — J439 Emphysema, unspecified: Secondary | ICD-10-CM

## 2017-05-24 DIAGNOSIS — J9601 Acute respiratory failure with hypoxia: Secondary | ICD-10-CM

## 2017-05-24 DIAGNOSIS — J939 Pneumothorax, unspecified: Secondary | ICD-10-CM

## 2017-05-24 DIAGNOSIS — Z938 Other artificial opening status: Secondary | ICD-10-CM

## 2017-05-24 LAB — BASIC METABOLIC PANEL
Anion gap: 7 (ref 5–15)
BUN: <5 mg/dL — ABNORMAL LOW (ref 6–20)
CALCIUM: 9.2 mg/dL (ref 8.9–10.3)
CO2: 26 mmol/L (ref 22–32)
CREATININE: 0.7 mg/dL (ref 0.61–1.24)
Chloride: 103 mmol/L (ref 101–111)
GFR calc non Af Amer: >60 mL/min (ref >60)
Glucose, Bld: 108 mg/dL — ABNORMAL HIGH (ref 65–99)
Potassium: 4.2 mmol/L (ref 3.5–5.1)
SODIUM: 136 mmol/L (ref 135–145)

## 2017-05-24 LAB — MAGNESIUM: MAGNESIUM: 2.5 mg/dL — AB (ref 1.7–2.4)

## 2017-05-24 MED ORDER — BUDESONIDE 90 MCG/ACT IN AEPB: 1.0000 | INHALATION_SPRAY | Freq: Two times a day (BID) | RESPIRATORY_TRACT | 0 refills | Status: DC

## 2017-05-24 MED ORDER — TIOTROPIUM BROMIDE MONOHYDRATE 18 MCG IN CAPS: 18.0000 ug | ORAL_CAPSULE | Freq: Every day | RESPIRATORY_TRACT | 0 refills | Status: DC

## 2017-05-24 MED ORDER — NICOTINE 14 MG/24HR TD PT24: 14.0000 mg | MEDICATED_PATCH | Freq: Every day | TRANSDERMAL | 0 refills | Status: DC

## 2017-05-24 MED ORDER — TIOTROPIUM BROMIDE MONOHYDRATE 18 MCG IN CAPS
18.0000 ug | ORAL_CAPSULE | Freq: Every day | RESPIRATORY_TRACT | Status: DC
  Filled 2017-05-24: qty 5

## 2017-05-24 MED ORDER — PROMETHAZINE HCL 25 MG PO TABS: 25.0000 mg | ORAL_TABLET | Freq: Four times a day (QID) | ORAL | 0 refills | Status: DC | PRN

## 2017-05-24 NOTE — Progress Notes (Signed)
Discharge instruction and prescriptions were given to the patient.  Belongings were sent home with the pt.  Hinton DyerYoko Ryver Poblete, RN

## 2017-05-24 NOTE — Progress Notes (Addendum)
      [address]       [place] [id number]             [phone]           Subjective: Patient hopes to go home today.  Objective: Vital signs in last 24 hours: Temp:  [97.5 F (36.4 C)-98.4 F (36.9 C)] 97.9 F (36.6 C) (12/01 0840) Pulse Rate:  [87-94] 87 (12/01 0448) Cardiac Rhythm: Normal sinus rhythm (12/01 0750) Resp:  [15-17] 17 (12/01 0448) BP: (101-126)/(68-85) 112/80 (12/01 0743) SpO2:  [93 %-100 %] 93 % (12/01 0840) Weight:  [119 lb 11.2 oz (54.3 kg)] 119 lb 11.2 oz (54.3 kg) (12/01 0448)     Intake/Output from previous day: 11/30 0701 - 12/01 0700 In: 1042.5 [P.O.:600; I.V.:442.5] Out: 1900 [Urine:1900]   Physical Exam:  Cardiovascular: RRR Pulmonary: Clear to auscultation bilaterally Wound: Dressing is clean and dry   Lab Results: CBC: No results for input(s): WBC, HGB, HCT, PLT in the last 72 hours. BMET:  Recent Labs    05/23/17 0356 05/24/17 0347  NA 135 136  K 4.0 4.2  CL 103 103  CO2 24 26  GLUCOSE 128* 108*  BUN <5* <5*  CREATININE 0.71 0.70  CALCIUM 8.7* 9.2    PT/INR: No results for input(s): LABPROT, INR in the last 72 hours. ABG:  INR: Will add last result for INR, ABG once components are confirmed Will add last 4 CBG results once components are confirmed  Assessment/Plan:  1. CV - SR 2.  Pulmonary - History of COPD. Right chest tube with scant output last 24 hours. Chest tube removed yesterday.  CXR this am appears stable (no pneumothorax, hyperinflation of lungs-COPD). 3. From CT surgical viewpoint, ok to discharge  Rykker Coviello M ZimmermanPA-C 05/24/2017,9:43 AM   9:43 AM

## 2017-05-24 NOTE — Discharge Summary (Signed)
Physician Discharge Summary  Dreama SaaKenneth T Horgan ZOX:[id number] DOB: August 05, 1960 DOA: 05/19/2017  PCP: Patient, No Pcp Per  Admit date: 05/19/2017 Discharge date: 05/24/2017  Time spent: 35 minutes  Recommendations for Outpatient Follow-up:  Acute spontaneous pneumothorax -Most likely secondary to ruptured bullous emphysema: Negative mediastinal shift -Cardiothoracic surgery placed right side chest tube -11/30 S/P chest tube removal  -12/1 obtain ambulatory SPO2 -follow-up with Dr. Tressie Stalkerlarence Owen Cardiothoracic surgery on 12/11 cute spontaneous pneumothorax  COPD/Tobacco abuse -Patient continues to be active smoker. Counseled patient extensively on sequela of continuing to smoke to include worsening COPD, increased future risk of spontaneous pneumothorax, MI, CVA, DEATH.  -Nicotine patch -Discharge on Pulmicort and Spiriva prior to discharge.   Acute respiratory failure with hypoxia -Ambulatory SPO2:patient does not qualify for home O2   Hypokalemia - Resolved   Hypomagnesemia -resolved   Chronic Pancreatitis/chronic abdominal pain -Continue current pain regimen - Per case management patient has been [facility] Monday to arrange follow-up as patient has been seen there in the past and would like to assume care at this facility. -patient should reestablish care and obtain appointment in 1-2 weeks spontaneous pneumothorax right, chronic pancreatitis, COPD.      Discharge Diagnoses:  Active Problems:   Spontaneous pneumothorax   Discharge Condition: stable  Diet recommendation: regular  Filed Weights   05/21/17 0343 05/22/17 0459 05/24/17 0448  Weight: 122 lb 5.7 oz (55.5 kg) 125 lb 14.1 oz (57.1 kg) 119 lb 11.2 oz (54.3 kg)    History of present illness:  56 y.o. WM PMHx   Depression, COPD/emphysema, active smoker not on home oxygen, EtOH abuse, EtOH Chronic Pancreatitis ( has not been drinking for last 10 years),  Duodenitis , Chronic Abdominal Pain, seen in ER 2 days ago for abdominal pain and nausea when patient was discharged with pain medications    Presented with worsening right-sided chest, epigastric pain associated with shortness of breath.  Patient reported that his symptoms are worsening for last 2 days to the point that he could not breathe well.  The right-sided chest pain is sharp, no radiation.  No associated with breathing or activity as per patient.  Denied fever, chills, headache, dizziness, dysuria, urgency.  He has chronic loose bowel movement and chronic cough which has not changed as per patient.  During this hospitalization patient was treated for spontaneous right-sided pneumothorax with chest tube placed by cardiothoracic surgery. pneumothorax resolved with removal of chest tube on 11/30. CXR on 12/1 appeared stable with no recurrent pneumothorax. Stable for discharge    Procedures: Right chest tube 11/26>> 11/30  Consultations: Cardiothoracic surgery    Discharge Exam: Vitals:   05/24/17 0743 05/24/17 0840 05/24/17 1255 05/24/17 1319  BP: 112/80  96/78   Pulse:      Resp:      Temp:  97.9 F (36.6 C) 97.9 F (36.6 C)   TempSrc:  Oral Oral   SpO2:  93% 94% 97%  Weight:      Height:        General: A/O 4, negative acute respiratory distress, cachectic Cardiovascular: regular rhythm and rate, negative murmurs rubs or gallops, normal S1/S2 Respiratory: clear to auscultation bilateral, distant/Diffuse air movement consistent with COPD  Discharge Instructions   Allergies as of 05/24/2017      Reactions   Nsaids Other (See Comments)   Bleeding gastritis      Medication List    STOP taking these medications   ondansetron  4 MG tablet Commonly known as:  ZOFRAN     TAKE these medications   atorvastatin 40 MG tablet Commonly known as:  LIPITOR Take 1 tablet (40 mg total) by mouth daily. What changed:  when to take this   Budesonide 90 MCG/ACT  inhaler Commonly known as:  PULMICORT FLEXHALER Inhale 1 puff into the lungs 2 (two) times daily.   HYDROcodone-acetaminophen 5-325 MG tablet Commonly known as:  NORCO/VICODIN Take 1-2 tablets by mouth every 4 (four) hours as needed for moderate pain.   nicotine 14 mg/24hr patch Commonly known as:  NICODERM CQ - dosed in mg/24 hours Place 1 patch (14 mg total) onto the skin daily. Start taking on:  05/25/2017   pantoprazole 40 MG tablet Commonly known as:  PROTONIX Take 1 tablet (40 mg total) by mouth 2 (two) times daily.   promethazine 25 MG tablet Commonly known as:  PHENERGAN Take 1 tablet (25 mg total) by mouth every 6 (six) hours as needed for nausea or vomiting.   tiotropium 18 MCG inhalation capsule Commonly known as:  SPIRIVA Place 1 capsule (18 mcg total) into inhaler and inhale daily.      Allergies  Allergen Reactions  . Nsaids Other (See Comments)    Bleeding gastritis   Follow-up Information    Purcell Nails, MD. Go on 06/03/2017.   Specialty:  Cardiothoracic Surgery Why:  PA/LAT CXR to be taken (at [facility] which is in the same building as Dr. Orvan July office) on 12/10/208 at 12:30 pm;Appointment time is at 1:00 pm Contact information: [address] [place] [address] [phone]        [facility] Follow up.   Why:  You may call the above number on Monday and make an appointment.  If you need a second option, please contact your Medicaid case worker to assign a new primary MD.   Contact information: [address] [place] [phone]       [facility]. Schedule an appointment as soon as possible for a visit in 1 week(s).   Why:  Call on Monday to try to make an appointment.   patient should reestablish care and obtain appointment in 1-2 weeks spontaneous pneumothorax right, chronic pancreatitis, COPD. Contact information: [address] [place] [address] [phone]           The results of significant diagnostics from this hospitalization (including imaging, microbiology, ancillary and laboratory) are listed below for reference.    Significant Diagnostic Studies: Dg Chest 2 View  Result Date: 05/24/2017 CLINICAL DATA:  Chest tube removal EXAM: CHEST  2 VIEW COMPARISON:  05/23/2017 FINDINGS: Interval removal of right chest tube. No visible pneumothorax. There is hyperinflation of the lungs compatible with COPD. No confluent opacities or effusions. Heart is normal size. IMPRESSION: Interval removal of right chest tube without visible pneumothorax. Electronically Signed   By: Charlett Nose M.D.   On: 05/24/2017 09:23   Dg Chest 2 View  Result Date: 05/19/2017 CLINICAL DATA:  Shortness of breath and right-sided chest pain EXAM: CHEST  2 VIEW COMPARISON:  Chest radiograph 01/12/2017 and 12/03/2016 CT abdomen pelvis 01/12/2017 and 12/03/2016 FINDINGS: There is a large right pneumothorax with apical and lateral components occupying at least 50% of the right hemithoracic volume. There is no leftward shift of the mediastinal structures. There is scarring at the left lung base, likely secondary to emphysema. Pons old  CTs of 12/03/2016 and 01/12/2017, there are large bullae demonstrated at the right lung base. However, the lateral and posterior basal segments of the right lower lobe or normally inflated at that time and the radiographic appearance was essentially normal. There has been a marked interval change, which may be secondary to a right rupture of a bulla. IMPRESSION: Large right pneumothorax, occupying approximately 50% the right hemithoracic volume. Given the patient's known bullous emphysema, this is probably secondary to rupture of a bulla. No mediastinal shift. Critical Value/emergent results were called by telephone at the time of interpretation on 05/19/2017 at 5:17 am to [place]az[place]JORDAN ROBINSON , who verbally  acknowledged these results. Electronically Signed   By: Deatra RobinsonKevin  Herman M.D.   On: 05/19/2017 05:17   Dg Chest 1v Repeat Same Day  Result Date: 05/19/2017 CLINICAL DATA:  Right chest tube placement. EXAM: CHEST - 1 VIEW SAME DAY COMPARISON:  05/19/2017 at 0457 hours FINDINGS: The cardiomediastinal silhouette is unchanged. The lungs are hyperinflated with underlying emphysema. A right chest tube has been placed and terminates at the lung apex. There is a small residual right-sided pneumothorax, most conspicuous laterally and greatly decreased in size from the prior study with re-expansion of the right lung. No airspace consolidation or large pleural effusion is seen. IMPRESSION: Small residual right pneumothorax following chest tube placement. Electronically Signed   By: Sebastian AcheAllen  Grady M.D.   On: 05/19/2017 07:41   Dg Chest Port 1 View  Result Date: 05/23/2017 CLINICAL DATA:  Shortness of breath, chest tubes EXAM: PORTABLE CHEST 1 VIEW COMPARISON:  05/22/2017 FINDINGS: Right-sided chest tube directed towards the apex. No residual pneumothorax. Hyperinflated lungs as can be seen with COPD. No pleural effusion. No focal consolidation. Stable cardiomediastinal silhouette. No acute osseous abnormality. IMPRESSION: Right-sided chest tube directed towards the apex. No residual pneumothorax. Electronically Signed   By: Elige KoHetal  Patel   On: 05/23/2017 09:39   Dg Chest Port 1 View  Result Date: 05/22/2017 CLINICAL DATA:  56 year old male with a history of chest 2 EXAM: PORTABLE CHEST 1 VIEW COMPARISON:  05/21/2017 FINDINGS: Cardiomediastinal silhouette unchanged in size and contour. Unchanged right large-bore thoracostomy tube terminating at the apex. Questionable residual small anterior pneumothorax adjacent to the chest tube. Changes of emphysema again noted chronic interstitial opacities bilaterally. No confluent airspace disease. No displaced fracture. IMPRESSION: Unchanged right-sided thoracostomy tube with  questionable small residual anterior pneumothorax adjacent to the chest tube. Chronic lung changes. Electronically Signed   By: Gilmer MorJaime  Wagner D.O.   On: 05/22/2017 08:39   Dg Chest Port 1 View  Result Date: 05/21/2017 CLINICAL DATA:  RIGHT thoracostomy tube EXAM: PORTABLE CHEST 1 VIEW COMPARISON:  Portable exam 0718 hours compared to 05/20/2017) earlier study of 12/03/2016 FINDINGS: RIGHT thoracostomy tube extending to RIGHT apex. Normal heart size, mediastinal contours, and pulmonary vascularity. Bibasilar atelectasis and suspect component of chronic interstitial disease. RIGHT costophrenic angle excluded. Underlying emphysematous changes. No acute infiltrate, pleural effusion or definite pneumothorax. No acute osseous findings. IMPRESSION: No pneumothorax post thoracostomy tube placement. Emphysematous changes with bibasilar atelectasis and probable component of chronic interstitial lung disease. Electronically Signed   By: Ulyses SouthwardMark  Boles M.D.   On: 05/21/2017 09:02   Dg Chest Port 1 View  Result Date: 05/20/2017 CLINICAL DATA:  Chest tube EXAM: PORTABLE CHEST 1 VIEW COMPARISON:  05/19/2017 FINDINGS: Right chest tube remains in place. Right basilar pneumothorax again noted, unchanged. Small right effusion. Left base scarring. There is hyperinflation of the lungs compatible with COPD. Heart is  normal size. IMPRESSION: Right chest tube remains in place with small residual right basilar hydropneumothorax. COPD. Electronically Signed   By: Charlett Nose M.D.   On: 05/20/2017 08:54   Dg Chest Port 1 View  Result Date: 05/19/2017 CLINICAL DATA:  Chest tube in place.  Increased chest pressure. EXAM: PORTABLE CHEST 1 VIEW COMPARISON:  Radiographs earlier this day most recently 0716 hour FINDINGS: Right chest tube remains in place. Decreased size of right basal lateral pneumothorax, small residual. Increasing atelectasis at the lung bases, no evidence of re-expansion pulmonary edema. No pleural fluid. Unchanged  heart size and mediastinal contours. IMPRESSION: Right chest tube in place with decreased size of residual pneumothorax from prior, with small persistent inferolateral component. Increasing atelectasis. Otherwise no change. Electronically Signed   By: Rubye Oaks M.D.   On: 05/19/2017 23:45    Microbiology: Recent Results (from the past 240 hour(s))  MRSA PCR Screening     Status: Abnormal   Collection Time: 05/19/17 11:26 PM  Result Value Ref Range Status   MRSA by PCR POSITIVE (A) NEGATIVE Final    Comment:        The GeneXpert MRSA Assay (FDA approved for NASAL specimens only), is one component of a comprehensive MRSA colonization surveillance program. It is not intended to diagnose MRSA infection nor to guide or monitor treatment for MRSA infections. RESULT CALLED TO, READ BACK BY AND VERIFIED WITHEvette Georges RN 1610 05/20/17 A BROWNING      Labs: Basic Metabolic Panel: Recent Labs  Lab 05/20/17 1514 05/21/17 0417 05/22/17 0332 05/23/17 0356 05/24/17 0347  NA 138 138 140 135 136  K 3.1* 3.5 3.9 4.0 4.2  CL 109 108 110 103 103  CO2 20* 25 22 24 26   GLUCOSE 129* 90 110* 128* 108*  BUN 11 5* <5* <5* <5*  CREATININE 0.67 0.65 0.70 0.71 0.70  CALCIUM 8.3* 8.5* 8.7* 8.7* 9.2  MG  --  1.5* 1.7 1.6* 2.5*  PHOS  --   --  2.4*  --   --    Liver Function Tests: Recent Labs  Lab 05/19/17 0206  AST 30  ALT 16*  ALKPHOS 87  BILITOT 0.7  PROT 7.2  ALBUMIN 4.1   Recent Labs  Lab 05/19/17 0206  LIPASE 20   No results for input(s): AMMONIA in the last 168 hours. CBC: Recent Labs  Lab 05/19/17 0206 05/20/17 1514  WBC 12.0* 7.7  HGB 14.8 13.5  HCT 43.0 39.7  MCV 89.4 89.4  PLT 189 PLATELET CLUMPS NOTED ON SMEAR, COUNT APPEARS DECREASED   Cardiac Enzymes: No results for input(s): CKTOTAL, CKMB, CKMBINDEX, TROPONINI in the last 168 hours. BNP: BNP (last 3 results) No results for input(s): BNP in the last 8760 hours.  ProBNP (last 3 results) No results  for input(s): PROBNP in the last 8760 hours.  CBG: No results for input(s): GLUCAP in the last 168 hours.     Signed:  Carolyne Littles, MD [facility] [phone] pager

## 2017-05-24 NOTE — Care Management Note (Signed)
Case Management Note  Patient Details  Name: Dreama SaaKenneth T Salzwedel MRN: [id number] Date of Birth: 12-22-1960  Subjective/Objective:         Pt presented for pneumothorax.  Pt has history of pancreatitis causing frequent admissions.  Pt has Medicaid but states he has no PCP because he does not like the PCP listed on his card.  He states he has attempted to find a new PCP but can't find one that take Medicaid and unable to get in touch with his Medicaid case worker.  Pt states he was a previous patient with [facility] and would like to return there as a pt.   Pt states he will be able to have prescriptions filled without problem but has not recently bc no refills.       Action/Plan: Spoke with a Teaching Service resident who was able to access clinic records.  Pt had 5 no-show appointments and was not rescheduled in the past.  Resident encourages pt to call on Monday to try to resume care there.  Pt also [facility] if unable to establish care at IM clinic.  Pt encouraged to get in touch with Medicaid case worker to resolve PCP issues.  [facility] clinic number and [facility] placed on AVS as CM cannot make appointments on the weekend.  Expected Discharge Date:  (UNKNOWN)               Expected Discharge Plan:  Home/Self Care  In-House Referral:  NA  Discharge planning Services  CM Consult(PCP)  Post Acute Care Choice:  NA Choice offered to:     DME Arranged:    DME Agency:     HH Arranged:    HH Agency:     Status of Service:     If discussed at [employer] Length of Stay Meetings, dates discussed:    Additional Comments:  Verdene LennertGoldean, Hellena Pridgen K, RN 05/24/2017, 10:48 AM

## 2017-05-24 NOTE — Progress Notes (Signed)
Ambulated pt without O2.  O2 sat stayed 89~94%.  Hinton DyerYoko Emmanuella Mirante, RN

## 2017-06-02 ENCOUNTER — Other Ambulatory Visit: Payer: Self-pay | Admitting: Thoracic Surgery (Cardiothoracic Vascular Surgery)

## 2017-06-02 DIAGNOSIS — J9383 Other pneumothorax: Secondary | ICD-10-CM

## 2017-06-03 ENCOUNTER — Ambulatory Visit: Payer: Medicaid Other

## 2017-06-04 ENCOUNTER — Ambulatory Visit: Payer: Medicaid Other

## 2017-06-06 ENCOUNTER — Other Ambulatory Visit: Payer: Self-pay

## 2017-06-06 ENCOUNTER — Ambulatory Visit
Admission: RE | Admit: 2017-06-06 | Discharge: 2017-06-06 | Disposition: A | Discharge status: Home / Self Care | Payer: Medicaid Other | Source: Ambulatory Visit | Attending: Thoracic Surgery (Cardiothoracic Vascular Surgery) | Admitting: Thoracic Surgery (Cardiothoracic Vascular Surgery)

## 2017-06-06 ENCOUNTER — Ambulatory Visit: Payer: Medicaid Other | Admitting: Physician Assistant

## 2017-06-06 VITALS — BP 104/70 | HR 100 | Resp 20 | Ht 68.0 in | Wt 121.4 lb

## 2017-06-06 DIAGNOSIS — J9383 Other pneumothorax: Secondary | ICD-10-CM

## 2017-06-06 NOTE — Patient Instructions (Signed)

## 2017-06-06 NOTE — Progress Notes (Signed)
  HPI:  Patient returns for routine follow-up having undergone a right chest tube placement for a right spontaneous pneumothorax. The patient is a smoker and has a history of COPD. The patient was discharged in stable condition on 05/24/2017. Since hospital discharge the patient reports he is still smoking but instead of a pack per day it is a pack per three days. He stats his breathing has been good and denies fever or chills.   Current Outpatient Medications  Medication Sig Dispense Refill  . atorvastatin (LIPITOR) 40 MG tablet Take 1 tablet (40 mg total) by mouth daily. (Patient taking differently: Take 40 mg by mouth at bedtime. ) 90 tablet 3  . Budesonide (PULMICORT FLEXHALER) 90 MCG/ACT inhaler Inhale 1 puff into the lungs 2 (two) times daily. 1 Inhaler 0  . HYDROcodone-acetaminophen (NORCO/VICODIN) 5-325 MG tablet Take 1-2 tablets by mouth every 4 (four) hours as needed for moderate pain. 15 tablet 0  . nicotine (NICODERM CQ - DOSED IN MG/24 HOURS) 14 mg/24hr patch Place 1 patch (14 mg total) onto the skin daily. 28 patch 0  . pantoprazole (PROTONIX) 40 MG tablet Take 1 tablet (40 mg total) by mouth 2 (two) times daily. (Patient not taking: Reported on 05/19/2017) 60 tablet 1  . promethazine (PHENERGAN) 25 MG tablet Take 1 tablet (25 mg total) by mouth every 6 (six) hours as needed for nausea or vomiting. 20 tablet 0  . tiotropium (SPIRIVA) 18 MCG inhalation capsule Place 1 capsule (18 mcg total) into inhaler and inhale daily. 30 capsule 0  Vital Signs: BP 104/70, HR 100, RR 20, oxygen saturation 96% on room air  Physical Exam: CV-RRR Pulmonary-Clear to auscultation bilaterally Wound-Sutures were removed. The edges of the chest tube wound are slightly dehisced but it is healed in the middle. Small eschar was removed. No surround erythema.  Diagnostic Tests: CLINICAL DATA:  Spontaneous pneumothorax.  EXAM: CHEST  2 VIEW  COMPARISON:  Chest x-ray  05/24/2017.  FINDINGS: Mediastinum hilar structures are normal. Heart size normal. COPD. Lungs are clear of acute infiltrates. Small bilateral pleural effusions cannot be excluded on today's exam. A component basal pleural-parenchymal thickening also most likely present. No pneumothorax. Pectus deformity. Degenerative changes thoracic spine.  IMPRESSION: 1.  No pneumothorax.  2. COPD and basal pleural-parenchymal scarring. Small bilateral pleural effusions cannot be excluded on today's exam .   Electronically Signed   By: Maisie Fushomas  Register   On: 06/06/2017 14:10  Impression and Plan: Overall, Mr. Shelle IronBeane is recovering well from a right spontaneous pneumothorax. He was instructed no strenuous activity for at least 1-2 more weeks. He was also instructed not to fly for at least one month. Regarding his chest tube wound, he was instructed to use soap and water only. He may cover it with a band aid and he should change it everyday. He was instructed if it begins draining or gets red, he is to call the office to be seen. Otherwise, we will see him PRN.   Ardelle Ballsonielle M Lawsyn Heiler, PA-C [facility] [phone]

## 2017-06-19 ENCOUNTER — Encounter (HOSPITAL_COMMUNITY): Payer: Self-pay | Admitting: Emergency Medicine

## 2017-06-19 ENCOUNTER — Emergency Department (HOSPITAL_COMMUNITY)
Admission: EM | Admit: 2017-06-19 | Discharge: 2017-06-19 | Discharge status: Against Medical Advice | Payer: Medicaid Other | Attending: Emergency Medicine | Admitting: Emergency Medicine

## 2017-06-19 ENCOUNTER — Emergency Department (HOSPITAL_COMMUNITY): Payer: Medicaid Other

## 2017-06-19 DIAGNOSIS — R109 Unspecified abdominal pain: Secondary | ICD-10-CM

## 2017-06-19 DIAGNOSIS — Z5320 Procedure and treatment not carried out because of patient's decision for unspecified reasons: Secondary | ICD-10-CM | POA: Diagnosis not present

## 2017-06-19 DIAGNOSIS — R0602 Shortness of breath: Secondary | ICD-10-CM | POA: Insufficient documentation

## 2017-06-19 DIAGNOSIS — F17210 Nicotine dependence, cigarettes, uncomplicated: Secondary | ICD-10-CM | POA: Diagnosis not present

## 2017-06-19 DIAGNOSIS — R1011 Right upper quadrant pain: Secondary | ICD-10-CM | POA: Diagnosis not present

## 2017-06-19 DIAGNOSIS — R1012 Left upper quadrant pain: Secondary | ICD-10-CM | POA: Diagnosis not present

## 2017-06-19 DIAGNOSIS — Z79899 Other long term (current) drug therapy: Secondary | ICD-10-CM | POA: Diagnosis not present

## 2017-06-19 DIAGNOSIS — R0989 Other specified symptoms and signs involving the circulatory and respiratory systems: Secondary | ICD-10-CM | POA: Diagnosis not present

## 2017-06-19 DIAGNOSIS — R197 Diarrhea, unspecified: Secondary | ICD-10-CM | POA: Insufficient documentation

## 2017-06-19 LAB — BASIC METABOLIC PANEL
ANION GAP: 13 (ref 5–15)
BUN: 9 mg/dL (ref 6–20)
CALCIUM: 9.5 mg/dL (ref 8.9–10.3)
CO2: 22 mmol/L (ref 22–32)
Chloride: 106 mmol/L (ref 101–111)
Creatinine, Ser: 0.81 mg/dL (ref 0.61–1.24)
GLUCOSE: 97 mg/dL (ref 65–99)
POTASSIUM: 3.4 mmol/L — AB (ref 3.5–5.1)
Sodium: 141 mmol/L (ref 135–145)

## 2017-06-19 LAB — HEPATIC FUNCTION PANEL
ALBUMIN: 4.1 g/dL (ref 3.5–5.0)
ALT: 9 U/L — AB (ref 17–63)
AST: 31 U/L (ref 15–41)
Alkaline Phosphatase: 90 U/L (ref 38–126)
Bilirubin, Direct: 0.1 mg/dL (ref 0.1–0.5)
Indirect Bilirubin: 0.5 mg/dL (ref 0.3–0.9)
Total Bilirubin: 0.6 mg/dL (ref 0.3–1.2)
Total Protein: 7.3 g/dL (ref 6.5–8.1)

## 2017-06-19 LAB — LIPASE, BLOOD: LIPASE: 41 U/L (ref 11–51)

## 2017-06-19 LAB — CBC
HEMATOCRIT: 45.8 % (ref 39.0–52.0)
HEMOGLOBIN: 15 g/dL (ref 13.0–17.0)
MCH: 30.4 pg (ref 26.0–34.0)
MCHC: 32.8 g/dL (ref 30.0–36.0)
MCV: 92.9 fL (ref 78.0–100.0)
Platelets: 178 10*3/uL (ref 150–400)
RBC: 4.93 MIL/uL (ref 4.22–5.81)
RDW: 14 % (ref 11.5–15.5)
WBC: 8.9 10*3/uL (ref 4.0–10.5)

## 2017-06-19 LAB — I-STAT TROPONIN, ED: TROPONIN I, POC: 0 ng/mL (ref 0.00–0.08)

## 2017-06-19 LAB — ETHANOL: Alcohol, Ethyl (B): <10 mg/dL (ref <10)

## 2017-06-19 MED ORDER — ACETAMINOPHEN 325 MG PO TABS
650.0000 mg | ORAL_TABLET | Freq: Once | ORAL | Status: DC
  Filled 2017-06-19: qty 2

## 2017-06-19 MED ORDER — SODIUM CHLORIDE 0.9 % IV BOLUS (SEPSIS)
1000.0000 mL | Freq: Once | INTRAVENOUS | Status: AC
  Administered 2017-06-19: 1000 mL via INTRAVENOUS

## 2017-06-19 MED ORDER — ONDANSETRON HCL 4 MG/2ML IJ SOLN
4.0000 mg | Freq: Once | INTRAMUSCULAR | Status: AC
  Administered 2017-06-19: 4 mg via INTRAVENOUS
  Filled 2017-06-19 (×2): qty 2

## 2017-06-19 NOTE — ED Notes (Signed)
Pt upset that he was only offered tylenol for pain. Pt requests this RN take out his IV and unhook him from monitors. Pt refusing to wait and talk to MD. Pt insisting he get his discharge papers. Pt informed that he is not currently discharged and to get his papers he would need to discuss his discharge with MD. Pt again refusing to wait.

## 2017-06-19 NOTE — ED Notes (Signed)
IV access infiltrated. Unable to secure another line. Attempted unsuccessfully.

## 2017-06-19 NOTE — ED Triage Notes (Signed)
Pt. Stated, I was here 3 weeks ago for the same symptoms. SOB. And stomach pain. I have chronic pancreatitis

## 2017-06-19 NOTE — ED Notes (Signed)
Patient transported to X-ray 

## 2017-06-19 NOTE — ED Notes (Signed)
IV team at the bedside. 

## 2017-06-19 NOTE — ED Notes (Signed)
Pt states unable to provide urine sample at this time.

## 2017-06-19 NOTE — ED Provider Notes (Signed)
[facility] EMERGENCY DEPARTMENT Provider Note   CSN: [id number] Arrival date & time: 06/19/17  4098     History   Chief Complaint Chief Complaint  Patient presents with  . Shortness of Breath  . Abdominal Pain    HPI [name] is a 56 y.o. male.  He presents for evaluation of abdominal pain, decreased appetite with occasional diarrhea yesterday.  He has persistent cough, and mild shortness of breath today.  Was recently treated for spontaneous pneumothorax with chest tube placement, and has been evaluated as an outpatient by the chest surgeon.  He denies fever, weakness or dizziness.  He states he has a history of pancreatitis.  He continues to smoke but is smoking less than previously.  He did not see any blood in the diarrhea.  There are no other known modifying factors.  HPI  Past Medical History:  Diagnosis Date  . Chronic pancreatitis (HCC)   . Depression   . Duodenitis    by EGD 1/07  . Emphysema    unclear diagnosis noted in previous EMR  . Heme positive stool    neg colonoscopies 11/2008 and 01/2008  . History of alcohol abuse   . Hyperlipidemia   . Psoriasis    extensive, responds to strong steroids, unable to start molecular therapies due to cost  . RUE weakness    2/2 cervical spondylosis  . Smoking   . Stomach ulcer     Patient Active Problem List   Diagnosis Date Noted  . Chronic abdominal pain   . Pneumothorax, right   . S/P chest tube placement (HCC)   . Panlobular emphysema (HCC)   . Acute respiratory failure with hypoxia (HCC)   . Tobacco abuse   . Spontaneous pneumothorax 05/19/2017  . Acute on chronic respiratory failure with hypoxia (HCC)   . Chronic cholecystitis with calculus s/p lap cholecystectomy 11/28/2016 11/28/2016  . Alcoholic fatty liver 11/28/2016  . Psoriasis   . Duodenitis   . HYPERKALEMIA 02/20/2010  . PSORIASIS 02/16/2009  . Abdominal pain 11/22/2008  . Chronic pancreatitis (HCC) 04/13/2008  .  EROSIVE GASTRITIS 02/24/2008  . BLOOD IN STOOL, OCCULT 01/18/2008  . MELENA, HX OF 11/26/2007  . Dyslipidemia 08/13/2007  . TOBACCO ABUSE 04/23/2007    Past Surgical History:  Procedure Laterality Date  . ESOPHAGOGASTRODUODENOSCOPY (EGD) WITH PROPOFOL N/A 12/04/2016   Procedure: ESOPHAGOGASTRODUODENOSCOPY (EGD) WITH PROPOFOL;  Surgeon: Kathi Der, MD;  Location: [facility];  Service: Gastroenterology;  Laterality: N/A;  . LAPAROSCOPIC CHOLECYSTECTOMY  11/28/2016   w/IOC  . LAPAROSCOPIC CHOLECYSTECTOMY SINGLE SITE WITH INTRAOPERATIVE CHOLANGIOGRAM N/A 11/28/2016   Procedure: LAPAROSCOPIC CHOLECYSTECTOMY SINGLE SITE WITH INTRAOPERATIVE CHOLANGIOGRAM;  Surgeon: Karie Soda, MD;  Location: [facility];  Service: General;  Laterality: N/A;  . LIVER BIOPSY N/A 11/28/2016   Procedure: NEEDLE CORE LIVER BIOPSY;  Surgeon: Karie Soda, MD;  Location: [facility] ORS;  Service: General;  Laterality: N/A;  . TYMPANOSTOMY TUBE PLACEMENT Bilateral ~ 1970       Home Medications    Prior to Admission medications   Medication Sig Start Date End Date Taking? Authorizing Provider  atorvastatin (LIPITOR) 40 MG tablet Take 1 tablet (40 mg total) by mouth daily. Patient taking differently: Take 40 mg by mouth at bedtime.  10/15/16 06/19/17 Yes Pricilla Riffle, MD  pantoprazole (PROTONIX) 40 MG tablet Take 1 tablet (40 mg total) by mouth 2 (two) times daily. Patient taking differently: Take 40 mg by mouth daily.  12/06/16  Yes Meuth,  Brooke A, PA-C  tiotropium (SPIRIVA) 18 MCG inhalation capsule Place 1 capsule (18 mcg total) into inhaler and inhale daily. 05/24/17  Yes Drema DallasWoods, Curtis J, MD  Budesonide (PULMICORT FLEXHALER) 90 MCG/ACT inhaler Inhale 1 puff into the lungs 2 (two) times daily. Patient not taking: Reported on 06/19/2017 05/24/17   Drema DallasWoods, Curtis J, MD  HYDROcodone-acetaminophen (NORCO/VICODIN) 5-325 MG tablet Take 1-2 tablets by mouth every 4 (four) hours as needed for moderate pain. Patient not taking:  Reported on 06/19/2017 05/17/17   Gilda CreasePollina, Christopher J, MD  nicotine (NICODERM CQ - DOSED IN MG/24 HOURS) 14 mg/24hr patch Place 1 patch (14 mg total) onto the skin daily. Patient not taking: Reported on 06/19/2017 05/25/17   Drema DallasWoods, Curtis J, MD  promethazine (PHENERGAN) 25 MG tablet Take 1 tablet (25 mg total) by mouth every 6 (six) hours as needed for nausea or vomiting. Patient not taking: Reported on 06/19/2017 05/24/17   Drema DallasWoods, Curtis J, MD    Family History Family History  Problem Relation Age of Onset  . Diabetes Mother   . Heart disease Father   . Cancer Father        ?kidney or liver cancer    Social History Social History   Tobacco Use  . Smoking status: Current Every Day Smoker    Packs/day: 1.00    Years: 40.00    Pack years: 40.00    Types: Cigarettes  . Smokeless tobacco: Never Used  Substance Use Topics  . Alcohol use: Yes    Comment: 12/03/2016 "nothing since 2008"  . Drug use: No    Comment: quit in high school     Allergies   Nsaids   Review of Systems Review of Systems  All other systems reviewed and are negative.    Physical Exam Updated Vital Signs BP 134/87   Pulse 91   Temp 97.6 F (36.4 C) (Oral)   Resp (!) 26   Ht 5\' 8"  (1.727 m)   Wt 54.9 kg (121 lb)   SpO2 95%   BMI 18.40 kg/m   Physical Exam  Constitutional: He is oriented to person, place, and time. He appears well-developed and well-nourished.  HENT:  Head: Normocephalic and atraumatic.  Right Ear: External ear normal.  Left Ear: External ear normal.  Eyes: Conjunctivae and EOM are normal. Pupils are equal, round, and reactive to light.  Neck: Normal range of motion and phonation normal. Neck supple.  Cardiovascular: Normal rate, regular rhythm and normal heart sounds.  Pulmonary/Chest: Effort normal and breath sounds normal. No respiratory distress. He exhibits no bony tenderness.  Decreased air movement bilaterally with scattered rhonchi, and a few wheezes.  Abdominal:  Soft. There is no tenderness (Upper bilateral, mild).  Musculoskeletal: Normal range of motion.  Neurological: He is alert and oriented to person, place, and time. No cranial nerve deficit or sensory deficit. He exhibits normal muscle tone. Coordination normal.  Skin: Skin is warm, dry and intact.  Psychiatric: He has a normal mood and affect. His behavior is normal. Judgment and thought content normal.  Nursing note and vitals reviewed.    ED Treatments / Results  Labs (all labs ordered are listed, but only abnormal results are displayed) Labs Reviewed  BASIC METABOLIC PANEL - Abnormal; Notable for the following components:      Result Value   Potassium 3.4 (*)    All other components within normal limits  HEPATIC FUNCTION PANEL - Abnormal; Notable for the following components:   ALT 9 (*)  All other components within normal limits  CBC  ETHANOL  LIPASE, BLOOD  URINALYSIS, ROUTINE W REFLEX MICROSCOPIC  RAPID URINE DRUG SCREEN, HOSP PERFORMED  I-STAT TROPONIN, ED    EKG  EKG Interpretation  Date/Time:  Thursday June 19 2017 08:17:11 EST Ventricular Rate:  104 PR Interval:  124 QRS Duration: 92 QT Interval:  360 QTC Calculation: 473 R Axis:   82 Text Interpretation:  Sinus tachycardia Abnormal ECG Artifact since last tracing no significant change Confirmed by Mancel BaleWentz, Eliette Drumwright [phone]([id number]) on 06/19/2017 10:21:41 AM       Radiology Dg Chest 2 View  Result Date: 06/19/2017 CLINICAL DATA:  Right-sided chest pain and shortness of breath for several days. History of chest tube treatment of right-sided pneumothorax 3 weeks ago. History of emphysema, current smoker, chronic pancreatitis. EXAM: CHEST  2 VIEW COMPARISON:  Chest x-ray of June 06, 2017 FINDINGS: The lungs are hyperinflated with hemidiaphragm flattening. There is no pneumothorax, pneumomediastinum, nor pleural effusion. There is no alveolar infiltrate. There is stable scarring at the right lung base likely related  to the chest tube. The heart and pulmonary vascularity are normal. The trachea is midline. The bony thorax exhibits no acute abnormality. IMPRESSION: Marked emphysematous changes. No pneumonia, pneumothorax, nor other acute cardiopulmonary abnormality. Electronically Signed   By: David  SwazilandJordan M.D.   On: 06/19/2017 09:07    Procedures Procedures (including critical care time)  Medications Ordered in ED Medications  acetaminophen (TYLENOL) tablet 650 mg (650 mg Oral Not Given 06/19/17 1421)  sodium chloride 0.9 % bolus 1,000 mL (0 mLs Intravenous Stopped 06/19/17 1421)  ondansetron (ZOFRAN) injection 4 mg (4 mg Intravenous Given 06/19/17 1317)     Initial Impression / Assessment and Plan / ED Course  I have reviewed the triage vital signs and the nursing notes.  Pertinent labs & imaging results that were available during my care of the patient were reviewed by me and considered in my medical decision making (see chart for details).  Clinical Course as of Jun 19 1434  Thu Jun 19, 2017  1435 The patient eloped before complete treatment of evaluation and treatment.  [EW]    Clinical Course User Index [EW] Mancel BaleWentz, Lorrine Killilea, MD     Patient Vitals for the past 24 hrs:  BP Temp Temp src Pulse Resp SpO2 Height Weight  06/19/17 1330 134/87 - - 91 - 95 % - -  06/19/17 1100 113/78 - - 94 - 99 % - -  06/19/17 0945 119/83 - - 92 (!) 26 100 % - -  06/19/17 0930 115/85 - - 92 (!) 21 98 % - -  06/19/17 0925 - - - 92 (!) 22 100 % - -  06/19/17 0915 (!) 122/96 - - 97 18 99 % - -  06/19/17 0856 (!) 130/98 - - (!) 102 (!) 25 98 % - -  06/19/17 0820 113/87 97.6 F (36.4 C) Oral (!) 103 19 100 % - -  06/19/17 0818 - - - - - - 5\' 8"  (1.727 m) 54.9 kg (121 lb)      Final Clinical Impressions(s) / ED Diagnoses   Final diagnoses:  Abdominal pain, unspecified abdominal location    Nonspecific pain with reassuring initial evaluation.  Patient with chronic symptoms, and stated need for stronger  medication.  Suspect drug-seeking behavior.  Doubt serious bacterial infection, metabolic instability or impending vascular collapse.  Nursing Notes Reviewed/ Care Coordinated Applicable Imaging Reviewed Interpretation of Laboratory Data incorporated into ED treatment  The  patient left AGAINST MEDICAL ADVICE, by elopement.  ED Discharge Orders    None       Mancel Bale, MD 06/19/17 1436

## 2017-06-19 NOTE — ED Notes (Signed)
Pt refusing tylenol. States he is allergic to tylenol. When asked about his reaction to tylenol pt states he has stomach ulcers and cannot take tylenol. Pt requesting something stronger than tylenol or for us to discharge him. Pt requesting to talk to the MD. MD made aware.

## 2017-07-07 ENCOUNTER — Encounter (INDEPENDENT_AMBULATORY_CARE_PROVIDER_SITE_OTHER): Payer: Self-pay

## 2017-07-07 ENCOUNTER — Other Ambulatory Visit: Payer: Self-pay

## 2017-07-07 ENCOUNTER — Encounter: Payer: Self-pay | Admitting: Internal Medicine

## 2017-07-07 ENCOUNTER — Ambulatory Visit: Payer: Medicaid Other | Admitting: Internal Medicine

## 2017-07-07 ENCOUNTER — Ambulatory Visit: Payer: Medicaid Other

## 2017-07-07 VITALS — BP 106/70 | HR 110 | Temp 99.0°F | Ht 68.0 in | Wt 116.0 lb

## 2017-07-07 DIAGNOSIS — L409 Psoriasis, unspecified: Secondary | ICD-10-CM

## 2017-07-07 DIAGNOSIS — E785 Hyperlipidemia, unspecified: Secondary | ICD-10-CM | POA: Diagnosis not present

## 2017-07-07 DIAGNOSIS — Z720 Tobacco use: Secondary | ICD-10-CM | POA: Diagnosis not present

## 2017-07-07 DIAGNOSIS — Z9049 Acquired absence of other specified parts of digestive tract: Secondary | ICD-10-CM | POA: Diagnosis not present

## 2017-07-07 DIAGNOSIS — K2980 Duodenitis without bleeding: Secondary | ICD-10-CM

## 2017-07-07 DIAGNOSIS — G8929 Other chronic pain: Secondary | ICD-10-CM

## 2017-07-07 DIAGNOSIS — F1011 Alcohol abuse, in remission: Secondary | ICD-10-CM | POA: Diagnosis not present

## 2017-07-07 DIAGNOSIS — K860 Alcohol-induced chronic pancreatitis: Secondary | ICD-10-CM

## 2017-07-07 DIAGNOSIS — R109 Unspecified abdominal pain: Secondary | ICD-10-CM | POA: Diagnosis not present

## 2017-07-07 DIAGNOSIS — Z79899 Other long term (current) drug therapy: Secondary | ICD-10-CM | POA: Diagnosis not present

## 2017-07-07 DIAGNOSIS — J431 Panlobular emphysema: Secondary | ICD-10-CM

## 2017-07-07 DIAGNOSIS — K861 Other chronic pancreatitis: Secondary | ICD-10-CM | POA: Diagnosis not present

## 2017-07-07 MED ORDER — BUDESONIDE 90 MCG/ACT IN AEPB: 1.0000 | INHALATION_SPRAY | Freq: Two times a day (BID) | RESPIRATORY_TRACT | 3 refills | Status: DC

## 2017-07-07 MED ORDER — TIOTROPIUM BROMIDE MONOHYDRATE 18 MCG IN CAPS: 18.0000 ug | ORAL_CAPSULE | Freq: Every day | RESPIRATORY_TRACT | 3 refills | Status: DC

## 2017-07-07 MED ORDER — ATORVASTATIN CALCIUM 40 MG PO TABS: 40.0000 mg | ORAL_TABLET | Freq: Every day | ORAL | 3 refills | Status: DC

## 2017-07-07 MED ORDER — TRIAMCINOLONE ACETONIDE 0.1 % EX CREA: 1.0000 "application " | TOPICAL_CREAM | Freq: Two times a day (BID) | CUTANEOUS | 0 refills | Status: DC

## 2017-07-07 MED ORDER — PANTOPRAZOLE SODIUM 40 MG PO TBEC: 40.0000 mg | DELAYED_RELEASE_TABLET | Freq: Every day | ORAL | 1 refills | Status: DC

## 2017-07-07 NOTE — Progress Notes (Signed)
CC: Psoriasis, abdominal pain   HPI:  [name] is a 57 y.o. M with a past medical history as described below who presents to the clinic to re-establish care for management of his chronic medical conditions.   Lung disease, Tobacco Use: The pt has a significant tobacco use history and was previously smoking 2 ppd (for ~30 years) and has been prescribed spiriva and pulmicort, currently only using spiriva due to running out of pulmicort. He was recently admitted 11/26 for a spontaneous ptx managed with a chest tube, felt to be due to rupture of an emphysematous bulla. He states his breathing is currently at baseline with no dyspnea. He wants to stop smoking and has cut back to less than a 1/2 ppd. However, he reports nicotine patches make him hyperactive and greatly interfere with sleep for up to 1-2 days after removal.   Psoriasis: He reports a history of psoriasis with areas of itchy, painful, and scaly areas in various areas of his body which have recently worsened. He reports the areas are very uncomfortable, especially to buttocks as sitting can worsen the sx. He states a cream he used in the past help relieve the itching, lesions have never fully resolved and he has not been seen by dermatology in the past.    Chronic abdominal pain: He reports a history of chronic abdominal pain due to chronic pancreatitis, as well as findings of gastritis, esophagitis, and duodenal ulcers on EGD in the summer. He states the pain is constant but fluctuates in intensity. He has several visits to the ED with complaints of abdominal pain. He states narcotic pain medications typically work for flares.   Past Medical History:  Diagnosis Date  . Chronic pancreatitis (HCC)   . Depression   . Duodenitis    by EGD 1/07  . Emphysema    unclear diagnosis noted in previous EMR  . Heme positive stool    neg colonoscopies 11/2008 and 01/2008  . History of alcohol abuse   . Hyperlipidemia   . Psoriasis    extensive, responds to strong steroids, unable to start molecular therapies due to cost  . RUE weakness    2/2 cervical spondylosis  . Smoking   . Stomach ulcer    Family History:  Family History  Problem Relation Age of Onset  . Diabetes Mother   . Heart disease Father   . Cancer Father        ?kidney or liver cancer    Social History:  Hx of alcohol abuse, reports no use for 10 years.  Started smoking at age 57, 2 ppd from age 89 to recently, currently down to a little less than 1/2 ppd.  Denies other drug use.  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   Review of Systems:  Review of Systems  Constitutional: Negative for fever.  Respiratory: Negative for shortness of breath, wheezing and stridor.   Gastrointestinal: Positive for abdominal pain. Negative for nausea and vomiting.  Skin: Positive for itching and rash.  Neurological: Negative for dizziness and headaches.     Physical Exam:  Vitals:   07/07/17 1304  BP: 106/70  Pulse: (!) 110  Temp: 99 F (37.2 C)  TempSrc: Oral  SpO2: 99%  Weight: 116 lb (52.6 kg)  Height: 5\' 8"  (1.727 m)   General: Sitting in chair comfortably, no acute distress Head:Normocephalic, atraumatic  Eyes: PERRL, normal conjuctiva  ENT: Teeth removed, no pharyngeal  exudate, moist mucus membranes  CV: RRR at time of exam, no murmur appreciated  Resp: Clear breath sounds bilaterally, no wheeze, normal work of breathing, no distress  Abd: Soft, +BS, tenderness to palpation primarily in epigastric area, no rebound, no guarding Extr: No LE edema  Neuro: Alert and oriented x3, normal gait  Skin: Large areas of pink plaques with overlying scaling and dry skin, tender to palpation. Primarily over extensor and lateral thigh, just proximal to gluteal cleft over sacrum, and forearms.       Assessment & Plan:   See Encounters Tab for problem based charting.  Patient discussed with Dr. Cleda Daub

## 2017-07-07 NOTE — Patient Instructions (Addendum)
Nice to meet you today [name].   [facility]  We also put in a referral to dermatology to check your psoriasis and in the meantime prescribe a steroid cream to hopefully help the pain and itching.  We also put in for you to get an appointment with GI doctors because they recommended you to follow-up with them after they did the endoscopy in the hospital at this summer.  In the meantime, we will continue with the Protonix to help prevent further pain and promote healing.   For your smoking, we gave some nicotine gum which will hopefully help with the cravings without making you hyper or keeping you awake. We will have you come back in about 3 months to see your primary care doctor that is assigned and see how you are doing and make any adjustments. If anything comes up before then, don't hesitate to call the clinic.

## 2017-07-08 NOTE — Assessment & Plan Note (Signed)
Refilled previously prescribed statin

## 2017-07-08 NOTE — Assessment & Plan Note (Addendum)
EGD in June '18 notes erosive esophagitis, gastritis, and duodenitis likely contributing to his abdominal pain issues. He has also had a lap chole with appearance of chronic calculous cholecystitis. He has been taking pantoprazole regularly, though needs a refill. The EGD report notes pt was to follow up around 8 weeks post-procedure for ongoing evaluation but there are no records of follow up. Will re-refer to GI at this time and refill protonix.   --Pantoprazole 40 mg daily --GI referral

## 2017-07-08 NOTE — Assessment & Plan Note (Signed)
The pt has chronic abdominal pain attributed to chronic pancreatitis from his history of heavy alcohol use. Imaging several years ago notes chronic calcific pancreatitis. It appears he sees the ED frequently for flares of his pain, and further back, has been on a pain contract with a clinic for opiate use related to his abdominal pain. On chart review, it appears his lipase is not significantly elevated with these episodes. While chronic pancreatitis can be an indication for opiate pain management, we will manage conservatively at this point which the patient is agreeable to. He was advised that narcotic pain medications are not a good chronic option for pain control and we will start with Tylenol (avoid NSAIDs) and he can call the clinic if he feel his is having a flare of his pain for discussion of short term options. Also encouraged continued use of Protonix to promote healing of previously seen gastritis, esophagitis. He may require replacement of pancreatic enzymes in the future if he does have pancreatic insufficiency as a result of his chronic pancreatitis.

## 2017-07-08 NOTE — Assessment & Plan Note (Signed)
He has a significant smoking history and has made some progress in cutting back, would like to continue to do so though NRT with patches was not well tolerated due to insomnia and hyperactivity side effects. He was provided a sample ( [id number]( [id number], Exp 04/20) of Nicotine gum from the clinic to help reduce cravings and hopefully avoid those side effects. We continued to provide encouragement and praised his efforts so far.

## 2017-07-08 NOTE — Assessment & Plan Note (Signed)
Pt has a history of psoriasis with extensive lesions on exam and associated sx of pain and itching. He has not been evaluated by dermatology per his report, though would benefit for consideration of management options. Placed dermatology referral and prescribed triamcinolone cream for symptomatic relief until he is able to be seen, though he likely needs higher potency or other treatment modalities for full control.  --Dermatology referral

## 2017-07-08 NOTE — Assessment & Plan Note (Signed)
No pulmonary function tests found on chart review, though pt is currently being treated with maintenance inhalers for presumed COPD and recent chest XRs note signs of marked emphysema which would be expected given his smoking history. Will put in for pulmonary function tests for more formal and complete evaluation of his lung disease. It appears he has been well controlled from a respiratory standpoint (apart from ptx) as his ED presentations are for abd pain rather than dyspnea or COPD exacerbation.   --PFTs --Refill pulmicort, cont spiriva

## 2017-07-10 NOTE — Progress Notes (Signed)
Internal Medicine Clinic Attending  Case discussed with Dr. Harden at the time of the visit.  We reviewed the resident's history and exam and pertinent patient test results.  I agree with the assessment, diagnosis, and plan of care documented in the resident's note.  

## 2017-07-11 ENCOUNTER — Telehealth: Payer: Self-pay | Admitting: *Deleted

## 2017-07-11 NOTE — Telephone Encounter (Signed)
Information for PA for Pulmicort Flexhaler [facility].  Denied as patient has not tried and failed a Pulmicort Inhaler, Flovent HFA Inhaler or Pulmicort Respules. 0.25mg , 0.5 mg or 1 mg.  PA [id number].  Message to be sent to Dr. Anthonette LegatoHarden to consider a change to a preferred medication.  Angelina OkGladys Herbin, RN 07/11/2017 11:35 AM.

## 2017-07-14 ENCOUNTER — Ambulatory Visit (HOSPITAL_COMMUNITY)
Admission: RE | Admit: 2017-07-14 | Discharge: 2017-07-14 | Disposition: A | Discharge status: Home / Self Care | Payer: Medicaid Other | Source: Ambulatory Visit | Attending: Internal Medicine | Admitting: Internal Medicine

## 2017-07-14 DIAGNOSIS — J431 Panlobular emphysema: Secondary | ICD-10-CM | POA: Insufficient documentation

## 2017-07-14 DIAGNOSIS — Z720 Tobacco use: Secondary | ICD-10-CM

## 2017-07-14 DIAGNOSIS — F17200 Nicotine dependence, unspecified, uncomplicated: Secondary | ICD-10-CM | POA: Diagnosis not present

## 2017-07-14 LAB — PULMONARY FUNCTION TEST
DL/VA % pred: 39 %
DL/VA: 1.78 ml/min/mmHg/L
DLCO UNC % PRED: 32 %
DLCO unc: 9.72 ml/min/mmHg
FEF 25-75 PRE: 0.4 L/s
FEF 25-75 Post: 0.65 L/sec
FEF2575-%CHANGE-POST: 62 %
FEF2575-%Pred-Post: 21 %
FEF2575-%Pred-Pre: 13 %
FEV1-%CHANGE-POST: 17 %
FEV1-%PRED-POST: 48 %
FEV1-%PRED-PRE: 40 %
FEV1-POST: 1.69 L
FEV1-PRE: 1.44 L
FEV1FVC-%Change-Post: 1 %
FEV1FVC-%Pred-Pre: 53 %
FEV6-%Change-Post: 17 %
FEV6-%PRED-POST: 77 %
FEV6-%Pred-Pre: 65 %
FEV6-POST: 3.39 L
FEV6-Pre: 2.87 L
FEV6FVC-%Change-Post: 3 %
FEV6FVC-%PRED-POST: 88 %
FEV6FVC-%Pred-Pre: 85 %
FVC-%CHANGE-POST: 16 %
FVC-%PRED-PRE: 76 %
FVC-%Pred-Post: 88 %
FVC-POST: 4.08 L
FVC-PRE: 3.51 L
POST FEV1/FVC RATIO: 41 %
PRE FEV1/FVC RATIO: 41 %
Post FEV6/FVC ratio: 85 %
Pre FEV6/FVC Ratio: 82 %
RV % PRED: 168 %
RV: 3.41 L
TLC % pred: 107 %
TLC: 7.09 L

## 2017-07-14 MED ORDER — ALBUTEROL SULFATE (2.5 MG/3ML) 0.083% IN NEBU
2.5000 mg | INHALATION_SOLUTION | Freq: Once | RESPIRATORY_TRACT | Status: AC
  Administered 2017-07-14: 2.5 mg via RESPIRATORY_TRACT

## 2017-07-22 DIAGNOSIS — L400 Psoriasis vulgaris: Secondary | ICD-10-CM | POA: Diagnosis not present

## 2017-07-25 ENCOUNTER — Telehealth: Payer: Self-pay | Admitting: Internal Medicine

## 2017-07-25 NOTE — Telephone Encounter (Signed)
PATIENT NEEDS SOMETHING FOR PAIN, [facility] [phone]

## 2017-07-31 NOTE — Telephone Encounter (Signed)
No answer when called 

## 2017-07-31 NOTE — Telephone Encounter (Signed)
Called back, tried another #, spoke to mother then pt, appt [facility] 2/8, states Rupper abd pain 6-7 scale

## 2017-08-01 ENCOUNTER — Ambulatory Visit: Payer: Medicaid Other | Admitting: Internal Medicine

## 2017-08-01 ENCOUNTER — Encounter: Payer: Self-pay | Admitting: Internal Medicine

## 2017-08-01 VITALS — BP 98/74 | HR 98 | Temp 98.8°F | Wt 114.5 lb

## 2017-08-01 DIAGNOSIS — K860 Alcohol-induced chronic pancreatitis: Secondary | ICD-10-CM | POA: Diagnosis not present

## 2017-08-01 DIAGNOSIS — K861 Other chronic pancreatitis: Secondary | ICD-10-CM | POA: Diagnosis not present

## 2017-08-01 MED ORDER — ACETAMINOPHEN 500 MG PO TABS: 1000.0000 mg | ORAL_TABLET | Freq: Two times a day (BID) | ORAL | 0 refills | Status: AC

## 2017-08-01 NOTE — Assessment & Plan Note (Addendum)
Patient presents requesting Percocet for abdominal pain.  Recently seen by PCP at which time patient was agreeable to try conservative management with Tylenol for chronic pancreatitis pain.  He reports Tylenol does not help with the pain. He is unable to specify how much Tylenol he has been taking.  States he is having trouble sleeping, decreased appetite, and nausea. No vomiting or diarrhea.  Recently seen by GI and started on Zenpep.  Previously going to pain clinic, but stopped going 1 year ago because "they were not organized and it was a mess." Review of database showed last prescription for percocet was on 04/2017 by ED provider. Discussed with patient will continue conservative therapy at this time and that he should follow-up with his PCP regarding starting opiate therapy for his chronic pancreatitis.   - CMP, GGT, lipase, urine alcohol  - Tylenol 1000 BID PRN  - Follow up with PCP on 3/5 to discuss opiate therapy  - Counseled on alcohol cessation, stated he stopped drinking 10 years ago

## 2017-08-01 NOTE — Patient Instructions (Signed)
[name]  Please schedule an appointment with Dr. Anthonette LegatoHarden on 3/5.  You can ask to move your appointment sooner than this if he has any cancellations.   Will call you if results from your blood tests are not normal.   Please call use if you have any questions.

## 2017-08-01 NOTE — Telephone Encounter (Signed)
[facility]

## 2017-08-01 NOTE — Progress Notes (Signed)
   CC: Chronic pancreatitis follow-up  HPI:  [name] is a 57 y.o. male with PMH listed below who presents to clinic for chronic pancreatitis follow-up.  Please see problem based assessment and plan for further details.  Past Medical History:  Diagnosis Date  . Chronic pancreatitis (HCC)   . Depression   . Duodenitis    by EGD 1/07  . Emphysema    unclear diagnosis noted in previous EMR  . Heme positive stool    neg colonoscopies 11/2008 and 01/2008  . History of alcohol abuse   . Hyperlipidemia   . Psoriasis    extensive, responds to strong steroids, unable to start molecular therapies due to cost  . RUE weakness    2/2 cervical spondylosis  . Smoking   . Stomach ulcer    Review of Systems:   Review of Systems  Constitutional: Positive for weight loss. Negative for chills, diaphoresis, fever and malaise/fatigue.  Respiratory: Negative for cough and shortness of breath.   Cardiovascular: Negative for chest pain, palpitations and leg swelling.  Gastrointestinal: Positive for abdominal pain and nausea. Negative for blood in stool, diarrhea, melena and vomiting.  Neurological: Negative for weakness.    Physical Exam:  Vitals:   08/01/17 0945  BP: 98/74  Pulse: 98  Temp: 98.8 F (37.1 C)  TempSrc: Oral  SpO2: 98%  Weight: 114 lb 8 oz (51.9 kg)   General: Pleasant male, emaciated, well-developed, in no acute distress  Cardiac: regular rate and rhythm, nl S1/S2, no murmurs, rubs or gallops  Pulm: CTAB, no wheezes or crackles, no increased work of breathing  Abd: Mild tenderness over right upper quadrant, soft, nondistended, normoactive bowel sounds, no rebound tenderness  Ext: warm and well perfused, no peripheral edema    Assessment & Plan:   See Encounters Tab for problem based charting.  Patient discussed with [name]

## 2017-08-02 LAB — CMP14 + ANION GAP
A/G RATIO: 1.6 (ref 1.2–2.2)
ALBUMIN: 5.1 g/dL (ref 3.5–5.5)
ALK PHOS: 112 IU/L (ref 39–117)
ALT: 12 IU/L (ref 0–44)
AST: 24 IU/L (ref 0–40)
Anion Gap: 21 mmol/L — ABNORMAL HIGH (ref 10.0–18.0)
BILIRUBIN TOTAL: 0.3 mg/dL (ref 0.0–1.2)
BUN / CREAT RATIO: 17 (ref 9–20)
BUN: 17 mg/dL (ref 6–24)
CHLORIDE: 103 mmol/L (ref 96–106)
CO2: 17 mmol/L — ABNORMAL LOW (ref 20–29)
Calcium: 10.4 mg/dL — ABNORMAL HIGH (ref 8.7–10.2)
Creatinine, Ser: 0.98 mg/dL (ref 0.76–1.27)
GFR calc Af Amer: 101 mL/min/{1.73_m2} (ref >59)
GFR calc non Af Amer: 87 mL/min/{1.73_m2} (ref >59)
GLOBULIN, TOTAL: 3.1 g/dL (ref 1.5–4.5)
Glucose: 99 mg/dL (ref 65–99)
POTASSIUM: 4.2 mmol/L (ref 3.5–5.2)
SODIUM: 141 mmol/L (ref 134–144)
Total Protein: 8.2 g/dL (ref 6.0–8.5)

## 2017-08-02 LAB — SPECIMEN STATUS

## 2017-08-02 LAB — LIPASE: Lipase: 43 U/L (ref 13–78)

## 2017-08-02 LAB — GAMMA GT: GGT: 44 IU/L (ref 0–65)

## 2017-08-03 LAB — ETHYL GLUCURONIDE, URINE: Ethyl Glucuronide Screen, Ur: NEGATIVE ng/mL

## 2017-08-10 NOTE — Progress Notes (Signed)
Internal Medicine Clinic Attending  Case discussed with Dr. Santos-Sanchez at the time of the visit.  We reviewed the resident's history and exam and pertinent patient test results.  I agree with the assessment, diagnosis, and plan of care documented in the resident's note.    

## 2017-08-26 ENCOUNTER — Encounter (INDEPENDENT_AMBULATORY_CARE_PROVIDER_SITE_OTHER): Payer: Self-pay

## 2017-08-26 ENCOUNTER — Encounter: Payer: Self-pay | Admitting: Internal Medicine

## 2017-08-26 ENCOUNTER — Ambulatory Visit: Payer: Medicaid Other | Admitting: Internal Medicine

## 2017-08-26 ENCOUNTER — Other Ambulatory Visit: Payer: Self-pay

## 2017-08-26 VITALS — BP 95/69 | HR 98 | Temp 97.9°F | Ht 68.0 in | Wt 116.0 lb

## 2017-08-26 DIAGNOSIS — K861 Other chronic pancreatitis: Secondary | ICD-10-CM

## 2017-08-26 DIAGNOSIS — F17210 Nicotine dependence, cigarettes, uncomplicated: Secondary | ICD-10-CM | POA: Diagnosis not present

## 2017-08-26 DIAGNOSIS — Z720 Tobacco use: Secondary | ICD-10-CM

## 2017-08-26 DIAGNOSIS — R109 Unspecified abdominal pain: Secondary | ICD-10-CM

## 2017-08-26 DIAGNOSIS — E785 Hyperlipidemia, unspecified: Secondary | ICD-10-CM | POA: Diagnosis not present

## 2017-08-26 DIAGNOSIS — J439 Emphysema, unspecified: Secondary | ICD-10-CM

## 2017-08-26 DIAGNOSIS — G8929 Other chronic pain: Secondary | ICD-10-CM

## 2017-08-26 DIAGNOSIS — L409 Psoriasis, unspecified: Secondary | ICD-10-CM

## 2017-08-26 DIAGNOSIS — Z8711 Personal history of peptic ulcer disease: Secondary | ICD-10-CM | POA: Diagnosis not present

## 2017-08-26 DIAGNOSIS — K860 Alcohol-induced chronic pancreatitis: Secondary | ICD-10-CM

## 2017-08-26 MED ORDER — AMITRIPTYLINE HCL 10 MG PO TABS: 10.0000 mg | ORAL_TABLET | Freq: Every day | ORAL | 1 refills | Status: DC

## 2017-08-26 MED ORDER — FOLIC ACID 1 MG PO TABS: 1.0000 mg | ORAL_TABLET | Freq: Every day | ORAL | 3 refills | Status: AC

## 2017-08-26 MED ORDER — ALBUTEROL SULFATE HFA 108 (90 BASE) MCG/ACT IN AERS: 1.0000 | INHALATION_SPRAY | Freq: Four times a day (QID) | RESPIRATORY_TRACT | 2 refills | Status: DC | PRN

## 2017-08-26 NOTE — Progress Notes (Signed)
   CC: Abdominal Pain   HPI:  Mr.Guerino T Shelle IronBeane is a 57 y.o. M with a past medical history of chronic pancreatitis, COPD, psoriasis, HLD, PUD who presents to the clinic for follow up of his chronic medical conditions  Chronic Abdominal Pain: He has chronic abdominal pain attributed to chronic pancreatitis, also has a history of PUD though he states these two types of pain are distinct and his prior PUD pain has been well controlled on PPI. In the interval since his last visit he followed up with GI who placed the pt on Zenpep for pancreatic enzyme replacement. He reports this has led to more well formed stool but his pain has not improved with this and tylenol. He had an [facility] visit on 2/8 for this increased pain. His pain is constant with varying levels of intensity, worsens after food. The pain has begun to interfere with his sleep, as well as his appetite, he is drinking ensure to supplement his meals. Denies vomiting.   COPD, Tobacco Use: He was unable to get pulmicort filled after his last visit because [facility] and he would be responsible for the cost. He has continued to use spiriva daily. He reports his breathing is overall stable but does note a few occasions of increased dyspnea that resolves with rest after a short time. He has decreased his tobacco use to less than a half ppd.   Psoriasis: Since his last visit, he has established with dermatology and begun treatment with methotrexate. He denies any perceived side effects and has been keeping his lab visits for routing monitoring. He reports his skin lesions and itching have improved since tx initiation.   Past Medical History:  Diagnosis Date  . Chronic pancreatitis (HCC)   . Depression   . Duodenitis    by EGD 1/07  . Emphysema    unclear diagnosis noted in previous EMR  . Heme positive stool    neg colonoscopies 11/2008 and 01/2008  . History of alcohol abuse   . Hyperlipidemia    . Psoriasis    extensive, responds to strong steroids, unable to start molecular therapies due to cost  . RUE weakness    2/2 cervical spondylosis  . Smoking   . Stomach ulcer    Review of Systems:  Review of Systems  Respiratory: Positive for shortness of breath.   Gastrointestinal: Positive for abdominal pain. Negative for heartburn and vomiting.  Skin: Positive for itching and rash.     Physical Exam:  Vitals:   08/26/17 1403  BP: 95/69  Pulse: 98  Temp: 97.9 F (36.6 C)  TempSrc: Oral  SpO2: 100%  Weight: 116 lb (52.6 kg)  Height: 5\' 8"  (1.727 m)   General: Thin male sitting in chair comfortably, no acute distress HEENT: Edentulous, moist mucus membranes  CV: RRR, no murmur appreciated Resp: Diminished breath sounds, no wheezing, normal work of breathing, no distress  Abd: Soft, +BS, TTP of epigastric and RUQ region  Extr: No LE edema Neuro: Alert and oriented x3 Skin: Warm, dry. Interval improvement in appearance of scaly psoriatic patches over upper and lower extremities    Assessment & Plan:   See Encounters Tab for problem based charting.  Patient discussed with Dr. Heide SparkNarendra

## 2017-08-26 NOTE — Patient Instructions (Signed)
Good to see you today Mr. being.  For your abdominal pain we are going to try a new medicine called amitriptyline that has been shown to help in chronic pancreatitis.  You can also continue taking the Tylenol, as well as ibuprofen (800 mg up to 3 times a day) which will hopefully help to break this pain cycle.  It should also help you get some sleep.  Please call the clinic if you have not noticed any change in a week or 2  I put in a prescription for an albuterol inhaler to use when you need it.  This is typically covered by insurance and I will touch base with the pharmacist here to see about other options for the Pulmicort I tried to prescribe last time but was too expensive.  Also put in a prescription for folic acid that most people who are taking methotrexate usually also take on a daily basis.  If your dermatologist as already prescribed this you do not need to fill it.

## 2017-08-27 ENCOUNTER — Encounter: Payer: Self-pay | Admitting: Internal Medicine

## 2017-08-27 NOTE — Assessment & Plan Note (Signed)
Currently smoking less than a half ppd which represents progress compared to his prior baseline. At his last visit he was provided a sample of nicotine gum (hasn't tolerated patches in the past due to insomnia side effects), though he has been unable to use the gum to reduce cravings due to not yet having his dentures. He reports his dentures should arrive soon and he is hopeful being able to use the gum will result in further reductions in his smoking.

## 2017-08-27 NOTE — Assessment & Plan Note (Signed)
He has established with Dermatology who has started methotrexate therapy and is monitoring appropriately with regular lab work. His lesions have started to improve with improved scaling, itching, and overall severity of his lesions. He is pleased with the improvement so far. He is not currently on folic acid supplementation which we will start today, derm can alter dosing per their preference on follow up. Appreciate their assistance in psoriasis management.

## 2017-08-27 NOTE — Assessment & Plan Note (Signed)
He reports ongoing abdominal pain not relieved by tylenol or replacement of pancreatic enzymes (prescribed by gastroenterology). He has used opiate pain medications in the past for this pain but we would like to avoid or delay initiation if possible. He is not having vomiting, no weight loss since his last visit (though the pt subjectively feels like he has lost weight). On literature review, it appears that amitriptyline has been used as an adjunctive therapy to treat chronic abdominal pain including chronic pancreatitis. We will start this, and continue to encourage tylenol and nsaid use which will hopefully improve his pain. If he continues to have significant pain, a trial of opiate pain medication can be added--firstly on a short term basis-- to alleviate his pain.   --Amitriptyline 10 mg nightly, can increase to 20 mg nightly if tolerating after 2 weeks --Cont Tylenol, NSAID (with caution and short term for increased pain given his prior ulcer dz) --Cont Zenpep, PPI  --RTC 1-2 months

## 2017-08-27 NOTE — Assessment & Plan Note (Signed)
PFTs performed in the interval confirm pt's severe lung disease. He has been taking spiriva, but was unable to fil pulmicort. He also does not have an albuterol inhaler at home currently for acute sx relief during episodes of dyspnea described in HPI. Will prescribe albuterol as this should be able to be filled and explore options for more affordable maintenance regimen and contact pt with updated rx.   --Albuterol inhaler prn --Cont spiriva, explore option for pulmicort or alternative affordable option

## 2017-09-01 NOTE — Addendum Note (Signed)
Addended by: Earl LagosNARENDRA, Makar Slatter on: 09/01/2017 12:16 PM   Modules accepted: Level of Service

## 2017-09-01 NOTE — Progress Notes (Signed)
Internal Medicine Clinic Attending  Case discussed with Dr. Harden at the time of the visit.  We reviewed the resident's history and exam and pertinent patient test results.  I agree with the assessment, diagnosis, and plan of care documented in the resident's note.  

## 2017-10-28 ENCOUNTER — Encounter (INDEPENDENT_AMBULATORY_CARE_PROVIDER_SITE_OTHER): Payer: Self-pay

## 2017-10-28 ENCOUNTER — Ambulatory Visit: Payer: Medicaid Other | Admitting: Internal Medicine

## 2017-10-28 ENCOUNTER — Encounter: Payer: Self-pay | Admitting: Internal Medicine

## 2017-10-28 ENCOUNTER — Other Ambulatory Visit: Payer: Self-pay

## 2017-10-28 VITALS — BP 109/78 | HR 88 | Temp 98.0°F | Ht 68.0 in | Wt 113.3 lb

## 2017-10-28 DIAGNOSIS — Z972 Presence of dental prosthetic device (complete) (partial): Secondary | ICD-10-CM

## 2017-10-28 DIAGNOSIS — L409 Psoriasis, unspecified: Secondary | ICD-10-CM

## 2017-10-28 DIAGNOSIS — Z79891 Long term (current) use of opiate analgesic: Secondary | ICD-10-CM | POA: Diagnosis not present

## 2017-10-28 DIAGNOSIS — Z7951 Long term (current) use of inhaled steroids: Secondary | ICD-10-CM

## 2017-10-28 DIAGNOSIS — G8929 Other chronic pain: Secondary | ICD-10-CM

## 2017-10-28 DIAGNOSIS — J439 Emphysema, unspecified: Secondary | ICD-10-CM | POA: Diagnosis not present

## 2017-10-28 DIAGNOSIS — Z79899 Other long term (current) drug therapy: Secondary | ICD-10-CM

## 2017-10-28 DIAGNOSIS — F17210 Nicotine dependence, cigarettes, uncomplicated: Secondary | ICD-10-CM

## 2017-10-28 DIAGNOSIS — K861 Other chronic pancreatitis: Secondary | ICD-10-CM | POA: Diagnosis not present

## 2017-10-28 DIAGNOSIS — K860 Alcohol-induced chronic pancreatitis: Secondary | ICD-10-CM

## 2017-10-28 DIAGNOSIS — Z720 Tobacco use: Secondary | ICD-10-CM

## 2017-10-28 MED ORDER — AMITRIPTYLINE HCL 10 MG PO TABS: 20.0000 mg | ORAL_TABLET | Freq: Every day | ORAL | 3 refills | Status: DC

## 2017-10-28 MED ORDER — MOMETASONE FURO-FORMOTEROL FUM 200-5 MCG/ACT IN AERO: 1.0000 | INHALATION_SPRAY | Freq: Two times a day (BID) | RESPIRATORY_TRACT | 3 refills | Status: DC

## 2017-10-28 MED ORDER — TRAMADOL HCL 50 MG PO TABS: 50.0000 mg | ORAL_TABLET | Freq: Two times a day (BID) | ORAL | 0 refills | Status: DC

## 2017-10-28 NOTE — Patient Instructions (Addendum)
Good to see you again Mr. Andreoni. We are switching your inhaler to one that should be covered by Medicaid: Dulera.  This has to medicines and will hopefully improve your breathing.  Do 1 puff twice a day.  For your abdominal pain.  We are going to add tramadol.  We have had you fill out a contract and provide a urine sample and will need to follow various clinic protocols as it is a controlled substance.

## 2017-10-28 NOTE — Progress Notes (Signed)
   CC: Abdominal pain follow up   HPI:  [name] is a 57 y.o. male with a past medical history of chronic pancreatitis, COPD, psoriasis who presented to the clinic for follow-up of his abdominal pain and chronic medical conditions.  Chronic Abdominal Pain: He reports his abdominal pain has been fairly stable since last visit.  Amitriptyline has helped to relieve some pain, mostly at night.  Pain continues to worsen with eating and he feels his appetite and intake suffers as a result.  Remains adherent with pancreatic enzyme replacement.  COPD, Tobacco Use: Reports his breathing fluctuates with periods of increased dyspnea, especially with exertion.  Uses albuterol several times per day, also currently on Spiriva.  He has decreased his tobacco use from about 1/2 pack a day to 6 cigarettes/day.  Psoriasis: Remains on methotrexate with market improvement of his psoriasis, following closely with dermatology for monitoring.  Notes some gum swelling but no mucositis.  Past Medical History:  Diagnosis Date  . Chronic pancreatitis (HCC)   . Depression   . Duodenitis    by EGD 1/07  . Emphysema    unclear diagnosis noted in previous EMR  . Heme positive stool    neg colonoscopies 11/2008 and 01/2008  . History of alcohol abuse   . Hyperlipidemia   . Psoriasis    extensive, responds to strong steroids, unable to start molecular therapies due to cost  . RUE weakness    2/2 cervical spondylosis  . Stomach ulcer    Review of Systems:  Review of Systems  Constitutional: Negative for chills and fever.  Respiratory: Positive for shortness of breath. Negative for cough.   Gastrointestinal: Positive for abdominal pain. Negative for constipation, diarrhea and vomiting.  Skin: Negative for itching.     Physical Exam:  Vitals:   10/28/17 1314  BP: 109/78  Pulse: 88  Temp: 98 F (36.7 C)  TempSrc: Oral  SpO2: 99%  Weight: 113 lb 4.8 oz (51.4 kg)  Height:  (1.727 m)    General: Thin gentleman sitting in chair comfortably, no acute distress HEENT: Not wearing dentures, moist mucus membranes, normal conjuctiva CV: RRR Resp: Normal work of breathing, no distress  Abd: Soft, +BS, mild TTP in epigastric region  Neuro: Alert and oriented x3, normal gait Skin: Warm, dry. Continued improvement in patches of psoriasis to upper and lower extremities       Assessment & Plan:   See Encounters Tab for problem based charting.  Patient discussed with Dr. Cleda Daub

## 2017-10-29 ENCOUNTER — Telehealth: Payer: Self-pay | Admitting: *Deleted

## 2017-10-29 ENCOUNTER — Encounter: Payer: Self-pay | Admitting: Internal Medicine

## 2017-10-29 NOTE — Assessment & Plan Note (Signed)
Patient has been on Spiriva and using an albuterol inhaler as needed.  He was previously on Pulmicort but when attempting to refill previously, it was not covered by his Medicaid.  Review of preferred drug list notes Flovent has similar inhaled corticosteroid.  Alternatively, can start a inhaled corticosteroid/LABA combination given his symptoms and degree of disease on PFTs.  No LABA/LAMA combination noted on preferred medication list for Medicaid.  --Cont albuterol, spiriva --Start Dulera 200-5 1 puff BID (can increase to 2 puffs BID if needed)

## 2017-10-29 NOTE — Telephone Encounter (Addendum)
Information was faxed to [facility] for PA request for Tramadol.  Awaiting decision.  Angelina Ok, RN 10/29/2017 10:35 AM.  Call to [facility] for Tramadol was approved 10/29/2017 thru 04/27/2018.  [facility] [address] was called and notified of.  Angelina Ok, RN 10/30/2017 2:01 PM.

## 2017-10-29 NOTE — Assessment & Plan Note (Signed)
Appreciate dermatology assistance.  Psoriatic lesions have improved extensively with initiation of methotrexate.  Continue folic acid supplementation.

## 2017-10-29 NOTE — Assessment & Plan Note (Signed)
Down to 6 cigarettes/day from half pack a day.  Patient has found it difficult to use nicotine gum.  He has obtained dentures but notes some gum swelling with methotrexate.  Applauded his efforts and encouraged to continue gradual reduction in tobacco use with hopes for cessation in the future.

## 2017-10-29 NOTE — Progress Notes (Signed)
Internal Medicine Clinic Attending  Case discussed with Dr. Anthonette Legato at the time of the visit.  We reviewed the resident's history and exam and pertinent patient test results.  I agree with the assessment, diagnosis, and plan of care documented in the resident's note. We reviewed previous red flag issues in chart concerning opioid medications, we discussed a trial of partial opioid tramadol with close monitoring and clear expectations set forth in pain contract.  UDS obtained today.

## 2017-10-29 NOTE — Assessment & Plan Note (Signed)
He has chronic abdominal pain due to chronic pancreatitis has remained stable.  Notes some relief with amitriptyline 20 mg nightly, but this does not extend into the day.  Patient weight is at 113 pounds, currently on pancreatic enzyme replacement with Zenpep.  Will continue to encourage Tylenol use, continue amitriptyline.  At this point, we will start tramadol 50 mg twice daily in an attempt to improve his pain and oral intake.  Hopefully, this will improve his pain and not require stronger narcotics which he has been on in the past with potential red flags noted.  He was counseled on appropriate behavior with a controlled substance and monitoring protocols.  Database review shows no recent filled prescriptions from other prescribers.  We will collect UDS today, pain contract completed. Low risk of serotonin syndrome with amitryptyline and tramadol but will be aware should he develop sx.   --Cont amitriptyline, Tylenol, Zenpep --Start Tramadol 50 mg BID #60 --UDS

## 2017-11-05 LAB — TOXASSURE SELECT,+ANTIDEPR,UR

## 2017-12-02 ENCOUNTER — Encounter: Payer: Self-pay | Admitting: Internal Medicine

## 2017-12-02 ENCOUNTER — Other Ambulatory Visit: Payer: Self-pay

## 2017-12-02 ENCOUNTER — Encounter (INDEPENDENT_AMBULATORY_CARE_PROVIDER_SITE_OTHER): Payer: Self-pay

## 2017-12-02 ENCOUNTER — Ambulatory Visit: Payer: Medicaid Other | Admitting: Internal Medicine

## 2017-12-02 VITALS — BP 96/75 | HR 104 | Temp 98.1°F | Ht 68.0 in | Wt 111.2 lb

## 2017-12-02 DIAGNOSIS — K860 Alcohol-induced chronic pancreatitis: Secondary | ICD-10-CM | POA: Diagnosis not present

## 2017-12-02 DIAGNOSIS — J439 Emphysema, unspecified: Secondary | ICD-10-CM

## 2017-12-02 DIAGNOSIS — Z720 Tobacco use: Secondary | ICD-10-CM

## 2017-12-02 MED ORDER — TRAMADOL HCL 50 MG PO TABS: 100.0000 mg | ORAL_TABLET | Freq: Two times a day (BID) | ORAL | 0 refills | Status: DC

## 2017-12-02 MED ORDER — MOMETASONE FURO-FORMOTEROL FUM 200-5 MCG/ACT IN AERO: 2.0000 | INHALATION_SPRAY | Freq: Two times a day (BID) | RESPIRATORY_TRACT | 3 refills | Status: DC

## 2017-12-02 NOTE — Patient Instructions (Signed)
Good to see you again [name].   We are going to increase the dose of your dulera to try to help your breathing. Now, take 2 puffs twice a day (instead of one puff).   For your pain, we are going to increase the Tramadol to 100 mg twice a day--and you can take this closer to when you are going to bed at night and take the first dose of the day about 8-10 hours before that dose. Continue the amitriptyline and other medications without changes. Avoid using other pain medicines from other sources.   We will see you back in another month to continue to reassess the abdominal pain and make any adjustments to your medicines. Don't hesitate to call the clinic with any questions.

## 2017-12-07 ENCOUNTER — Encounter: Payer: Self-pay | Admitting: Internal Medicine

## 2017-12-07 NOTE — Progress Notes (Signed)
   CC: F/u chronic abdominal pain, COPD  HPI:  [name] is a 57 y.o. male with a past medical history of COPD, psoriasis, chronic abdominal pain due to chronic pancreatitis, duodenitis who presents to the clinic for follow-up of chronic abdominal pain and COPD.  Chronic pancreatitis and abdominal pain: On last visit, patient was prescribed tramadol as an escalation in pain control.  Initial UDS at that time showed hydrocodone and metabolites.  He reports he got this from prior friends in an attempt to treat his pain and believes the dose was 5-325 mg.  He stated he last took this medication several weeks prior to time of UDS.  Since initiation of tramadol, he states he has not taken other agents apart from as prescribed.  States he continues to have good days and bad days but feels tramadol has provided an overall improvement with more good days.  He feels the pain seems to be worse at night compared to the mornings.  COPD: He reports continued periods of shortness of breath including instances of waking up at night several days a week and requiring albuterol.  He continues to smoke around 5 cigarettes/day.   Past Medical History:  Diagnosis Date  . Chronic pancreatitis (HCC)   . Depression   . Duodenitis    by EGD 1/07  . Emphysema    unclear diagnosis noted in previous EMR  . History of alcohol abuse   . Hyperlipidemia   . Psoriasis    extensive, responds to strong steroids, unable to start molecular therapies due to cost  . RUE weakness    2/2 cervical spondylosis  . Stomach ulcer    Review of Systems:  Review of Systems  Constitutional: Negative for fever.  Respiratory: Positive for shortness of breath and wheezing.   Gastrointestinal: Positive for abdominal pain. Negative for vomiting.  Neurological: Negative for dizziness.     Physical Exam:  Vitals:   12/02/17 1532  BP: 96/75  Pulse: (!) 104  Temp: 98.1 F (36.7 C)  TempSrc: Oral  SpO2: 99%  Weight: 111  lb 3.2 oz (50.4 kg)  Height: 5\' 8"  (1.727 m)   General: Thin gentleman sitting in chair comfortably, no acute distress HEENT: Moist mucus membranes, no exudate, not wearing dentures CV: RRR at time of exam, no murmur appreciated  Resp: Diminished breath sounds without wheezing, normal work of breathing, no distress  Abd: Soft, +BS, thin, TTP of epigastric region  Extr: No LE edema, good peripheral pulses Neuro: Alert and oriented x3, normal gait Skin: Warm, dry. Continued interval improvement in psoriasis lesions     Assessment & Plan:   See Encounters Tab for problem based charting.  Patient discussed with Dr. Oswaldo DoneVincent

## 2017-12-07 NOTE — Assessment & Plan Note (Signed)
Chronic abdominal pain due to chronic pancreatitis which is only slightly improved after addition of tramadol.  He has also continue to take amitriptyline and pancreatic enzyme replacement with Zenpep.  His initial UDS showed hydrocodone and metabolites, THC which he readily reported and stated he has not used hydrocodone or marijuana since being prescribed tramadol.  Today, we will increase the tramadol to 100 mg twice daily, and patient was instructed that he could take the tramadol closer to bedtime as this was when his pain is at its most severe.  He was counseled to only take the prescribed tramadol and avoid seeking other sources of pain control in an effort to increase safety and adhere to agreed-upon pain contract guidelines.  We will continue to check and see database regularly and intermittently check urine drug screen.  --Cont amitriptyline, Tylenol, Zenpep --Increase Tramadol to 100 mg BID #120 (50 mg tablet strength), 1 month supply --RTC 4 weeks for ongoing evaluation and adjustment

## 2017-12-07 NOTE — Assessment & Plan Note (Signed)
Continues to smoke about 5 cigarettes a day, stable from most recent but improved overall.  Counseled on benefits of complete cessation.

## 2017-12-07 NOTE — Assessment & Plan Note (Signed)
He is currently on Spiriva and Dulera (based on preferred Medicaid medication list) and albuterol rescue inhaler.  He continues to report periods of increased shortness of breath, especially at night several times per week.  Will increase dose of Dulera at this point.  In the future, may need to be set up with albuterol nebulizer for home use if required.  --Continue albuterol, Spiriva --Increase Dulera to 2 puffs twice daily

## 2017-12-08 NOTE — Progress Notes (Signed)
Internal Medicine Clinic Attending  Case discussed with Dr. Harden at the time of the visit.  We reviewed the resident's history and exam and pertinent patient test results.  I agree with the assessment, diagnosis, and plan of care documented in the resident's note.  

## 2017-12-08 NOTE — Addendum Note (Signed)
Addended by: Erlinda HongVINCENT, DUNCAN T on: 12/08/2017 10:50 AM   Modules accepted: Level of Service

## 2017-12-29 ENCOUNTER — Other Ambulatory Visit: Payer: Self-pay

## 2017-12-29 MED ORDER — TRAMADOL HCL 50 MG PO TABS: 100.0000 mg | ORAL_TABLET | Freq: Two times a day (BID) | ORAL | 0 refills | Status: DC

## 2017-12-29 NOTE — Telephone Encounter (Signed)
Refill X 1, follow up planned.  Would not refill again if no shows for follow up.

## 2017-12-29 NOTE — Telephone Encounter (Signed)
traMADol (ULTRAM) 50 MG tablet, [facility] [address]

## 2018-01-01 DIAGNOSIS — L400 Psoriasis vulgaris: Secondary | ICD-10-CM | POA: Diagnosis not present

## 2018-01-08 DIAGNOSIS — L400 Psoriasis vulgaris: Secondary | ICD-10-CM | POA: Diagnosis not present

## 2018-01-09 ENCOUNTER — Ambulatory Visit: Payer: Medicaid Other | Admitting: Internal Medicine

## 2018-01-09 ENCOUNTER — Encounter: Payer: Self-pay | Admitting: Internal Medicine

## 2018-01-09 ENCOUNTER — Encounter (INDEPENDENT_AMBULATORY_CARE_PROVIDER_SITE_OTHER): Payer: Self-pay

## 2018-01-09 VITALS — BP 109/71 | HR 97 | Temp 98.1°F | Wt 109.0 lb

## 2018-01-09 DIAGNOSIS — F1010 Alcohol abuse, uncomplicated: Secondary | ICD-10-CM

## 2018-01-09 DIAGNOSIS — F17210 Nicotine dependence, cigarettes, uncomplicated: Secondary | ICD-10-CM | POA: Diagnosis not present

## 2018-01-09 DIAGNOSIS — Z7951 Long term (current) use of inhaled steroids: Secondary | ICD-10-CM

## 2018-01-09 DIAGNOSIS — J439 Emphysema, unspecified: Secondary | ICD-10-CM | POA: Diagnosis not present

## 2018-01-09 DIAGNOSIS — Z79899 Other long term (current) drug therapy: Secondary | ICD-10-CM

## 2018-01-09 DIAGNOSIS — Z79891 Long term (current) use of opiate analgesic: Secondary | ICD-10-CM | POA: Diagnosis not present

## 2018-01-09 DIAGNOSIS — K860 Alcohol-induced chronic pancreatitis: Secondary | ICD-10-CM | POA: Diagnosis not present

## 2018-01-09 MED ORDER — ALBUTEROL SULFATE HFA 108 (90 BASE) MCG/ACT IN AERS: 1.0000 | INHALATION_SPRAY | Freq: Four times a day (QID) | RESPIRATORY_TRACT | 2 refills | Status: DC | PRN

## 2018-01-09 MED ORDER — UMECLIDINIUM BROMIDE 62.5 MCG/INH IN AEPB: 1.0000 | INHALATION_SPRAY | Freq: Every day | RESPIRATORY_TRACT | 1 refills | Status: DC

## 2018-01-09 NOTE — Progress Notes (Signed)
   CC: COPD management  HPI: [name] is a 57 y.o. M w/ PMH of COPD, Chronic pancreatitis, and alcohol abuse presenting to the clinic for management of his chronic conditions. He has no acute complaints at this visit but he states he continues to have shortness of breath episodes on exertion. He states walking about one block's distance is about how much he can manage without resting. He has occasional coughing fits with brownish yellow sputum production. He denies any F/N/V/palpitations/chest pain.  Past Medical History:  Diagnosis Date  . Chronic pancreatitis (HCC)   . Depression   . Duodenitis    by EGD 1/07  . Emphysema    unclear diagnosis noted in previous EMR  . History of alcohol abuse   . Hyperlipidemia   . Psoriasis    extensive, responds to strong steroids, unable to start molecular therapies due to cost  . RUE weakness    2/2 cervical spondylosis  . Stomach ulcer    Review of Systems: Review of Systems  Constitutional: Negative for chills, fever, malaise/fatigue and weight loss.  Respiratory: Positive for cough and sputum production. Negative for hemoptysis and shortness of breath.   Cardiovascular: Negative for chest pain, palpitations and orthopnea.  Gastrointestinal: Positive for abdominal pain. Negative for constipation, diarrhea, nausea and vomiting.  Neurological: Negative for dizziness, sensory change, weakness and headaches.     Physical Exam: Vitals:   01/09/18 1442  BP: 109/71  Pulse: 97  Temp: 98.1 F (36.7 C)  TempSrc: Oral  SpO2: 97%  Weight: 109 lb (49.4 kg)   Physical Exam  Constitutional: He is oriented to person, place, and time. He appears well-developed and well-nourished. No distress.  Neck: Normal range of motion. Neck supple. No JVD present.  Cardiovascular: Normal rate, regular rhythm, normal heart sounds and intact distal pulses.  Respiratory: Effort normal. No respiratory distress. He has no wheezes. He has no rales.    Distant breath sounds  GI: Soft. Bowel sounds are normal. He exhibits no distension and no mass. There is tenderness. There is no guarding.  Epigastric tenderness to deep palpation  Lymphadenopathy:    He has no cervical adenopathy.  Neurological: He is alert and oriented to person, place, and time. He has normal reflexes. Coordination normal.     Assessment & Plan:   See Encounters Tab for problem based charting.  Patient seen with Dr. Criselda PeachesMullen   -[name], PGY1

## 2018-01-10 LAB — BMP8+ANION GAP
ANION GAP: 17 mmol/L (ref 10.0–18.0)
BUN/Creatinine Ratio: 17 (ref 9–20)
BUN: 14 mg/dL (ref 6–24)
CALCIUM: 10.3 mg/dL — AB (ref 8.7–10.2)
CHLORIDE: 103 mmol/L (ref 96–106)
CO2: 20 mmol/L (ref 20–29)
Creatinine, Ser: 0.84 mg/dL (ref 0.76–1.27)
GFR calc non Af Amer: 99 mL/min/{1.73_m2} (ref >59)
GFR, EST AFRICAN AMERICAN: 114 mL/min/{1.73_m2} (ref >59)
GLUCOSE: 96 mg/dL (ref 65–99)
POTASSIUM: 4.5 mmol/L (ref 3.5–5.2)
Sodium: 140 mmol/L (ref 134–144)

## 2018-01-11 ENCOUNTER — Encounter: Payer: Self-pay | Admitting: Internal Medicine

## 2018-01-11 NOTE — Patient Instructions (Signed)

## 2018-01-11 NOTE — Assessment & Plan Note (Addendum)
-   Pt with history of alcohol induced chronic pancreatitis - Continuing to experience chronic abdominal pain as well as loss of appetite due to pain - Abdominal tenderness on physical exam - Currently on amitriptyline, tylenol, zenpep and tramadol - Was given 1 month supply on last visit (12/07/17) but patient states he still has pills left  - c/w current regimen - Will decrease tramadol dose on next visit - Will check BMP to assess nutritional status

## 2018-01-11 NOTE — Assessment & Plan Note (Signed)
-   Currently on Proventil PRN and Dulera - 3~4 times a week use of Proventil - States smokes about quarter pack a day - Exhibiting symptoms of shortness of breath on exertion, and brownish yellow productive sputum - Physical Exam: Distant breath sounds, no wheezing  - Currently has LABA and low dose ICS but no LAMA - Was on Spiriva in the past but states he has stopped using due to it making his throat uncomfortable - Triple therapy indicated as pt continuing to experience symptoms on current regimen - Will add a different LAMA, umeclidinium to his current regimen - Refilled Proventil as pt requested - Patient states he is not ready to quit cigarette at this time - Will refer pt to lung cancer screening as pt has >30pack yr smoking hx

## 2018-01-12 ENCOUNTER — Encounter: Payer: Self-pay | Admitting: *Deleted

## 2018-01-12 NOTE — Progress Notes (Signed)
Internal Medicine Clinic Attending  I saw and evaluated the patient.  I personally confirmed the key portions of the history and exam documented by Dr. Lee and I reviewed pertinent patient test results.  The assessment, diagnosis, and plan were formulated together and I agree with the documentation in the resident's note.  

## 2018-01-20 ENCOUNTER — Telehealth: Payer: Self-pay | Admitting: *Deleted

## 2018-01-20 NOTE — Telephone Encounter (Signed)
Phone note entered in error - no call made or received at this time. Dorie RankSharon Mycala Warshawsky, RN, 01/20/18, 3:44P

## 2018-01-22 ENCOUNTER — Telehealth: Payer: Self-pay | Admitting: Internal Medicine

## 2018-01-26 ENCOUNTER — Other Ambulatory Visit: Payer: Self-pay | Admitting: Internal Medicine

## 2018-01-26 MED ORDER — TRAMADOL HCL 50 MG PO TABS: 100.0000 mg | ORAL_TABLET | Freq: Two times a day (BID) | ORAL | 0 refills | Status: DC

## 2018-01-26 NOTE — Telephone Encounter (Signed)
Needs refills on traMADol (ULTRAM) 50 MG tablet @ [facility] [place]; pt contact# [phone]

## 2018-01-28 ENCOUNTER — Telehealth: Payer: Self-pay | Admitting: *Deleted

## 2018-01-28 ENCOUNTER — Other Ambulatory Visit: Payer: Self-pay | Admitting: Internal Medicine

## 2018-01-28 NOTE — Telephone Encounter (Signed)
Called pharmacy, they have the script but it requires a PA [place] tracks

## 2018-01-28 NOTE — Telephone Encounter (Signed)
[facility] Tramadol 50 mg .  Awaiting decision.  Angelina OkGladys Jasiya Markie, RN 01/27/2018 2;54 PM.

## 2018-01-28 NOTE — Telephone Encounter (Signed)
Needs refill on traMADol (ULTRAM) 50 MG tablet @ [facility] [place], pt contact# [phone]. [facility] haven't heard anything from office.

## 2018-01-29 NOTE — Telephone Encounter (Signed)
Pt was calling checking on medicine, tramadol 50mg . Pt contact# [phone]

## 2018-02-02 ENCOUNTER — Telehealth: Payer: Self-pay | Admitting: *Deleted

## 2018-02-02 NOTE — Telephone Encounter (Signed)
CALLED OFFICE FOR STATUS OF REFERRAL, OFFICE TO CONTACT PATIENT TO SET UP APPOINTMENT.

## 2018-02-03 ENCOUNTER — Encounter: Payer: Self-pay | Admitting: Internal Medicine

## 2018-02-03 ENCOUNTER — Other Ambulatory Visit: Payer: Self-pay

## 2018-02-03 ENCOUNTER — Ambulatory Visit: Payer: Medicaid Other | Admitting: Internal Medicine

## 2018-02-03 DIAGNOSIS — F17210 Nicotine dependence, cigarettes, uncomplicated: Secondary | ICD-10-CM

## 2018-02-03 DIAGNOSIS — J439 Emphysema, unspecified: Secondary | ICD-10-CM

## 2018-02-03 DIAGNOSIS — Z7951 Long term (current) use of inhaled steroids: Secondary | ICD-10-CM

## 2018-02-03 DIAGNOSIS — K861 Other chronic pancreatitis: Secondary | ICD-10-CM | POA: Diagnosis not present

## 2018-02-03 DIAGNOSIS — F329 Major depressive disorder, single episode, unspecified: Secondary | ICD-10-CM

## 2018-02-03 DIAGNOSIS — Z720 Tobacco use: Secondary | ICD-10-CM

## 2018-02-03 DIAGNOSIS — F1011 Alcohol abuse, in remission: Secondary | ICD-10-CM

## 2018-02-03 DIAGNOSIS — Z79899 Other long term (current) drug therapy: Secondary | ICD-10-CM | POA: Diagnosis not present

## 2018-02-03 MED ORDER — ALBUTEROL SULFATE HFA 108 (90 BASE) MCG/ACT IN AERS
1.0000 | INHALATION_SPRAY | Freq: Four times a day (QID) | RESPIRATORY_TRACT | 2 refills | Status: DC | PRN
Start: 2018-02-03 — End: 2018-05-22

## 2018-02-03 MED ORDER — UMECLIDINIUM BROMIDE 62.5 MCG/INH IN AEPB: 1.0000 | INHALATION_SPRAY | Freq: Every day | RESPIRATORY_TRACT | 1 refills | Status: DC

## 2018-02-03 NOTE — Progress Notes (Signed)
   CC: COPD  HPI: [name] is a 57 y.o. M w/ PMH of chronic pancreatitis, depression, hx of alcohol abuse, and COPD presenting to the clinic for follow up of his COPD. He was seen at clinic 1 month ago for significant dyspnea on exertion and he was prescribed umeclidinium inhaler for maintenance in addition to his Dulera and albuterol inhaler for rescue. He states the pharmacists informed him that the prescription would not go through his insurance and he was unable to start his new inhalers. He is continuing to use his [name] but he states he has run out of his [name]. He is continuing to smoke about a 1/4 pack a day which is less than his usual. He has day time productive cough with brownish yellow sputum. Denies any F/N/V/D/C  Past Medical History:  Diagnosis Date  . Chronic pancreatitis (HCC)   . Depression   . Duodenitis    by EGD 1/07  . Emphysema    unclear diagnosis noted in previous EMR  . History of alcohol abuse   . Hyperlipidemia   . Psoriasis    extensive, responds to strong steroids, unable to start molecular therapies due to cost  . RUE weakness    2/2 cervical spondylosis  . Stomach ulcer    Review of Systems: Review of Systems  Constitutional: Negative for chills, fever and weight loss.  Respiratory: Positive for sputum production and shortness of breath. Negative for cough and hemoptysis.   Cardiovascular: Negative for chest pain, palpitations and orthopnea.  Gastrointestinal: Negative for constipation, diarrhea, heartburn, nausea and vomiting.  Neurological: Negative for dizziness, tingling, sensory change, weakness and headaches.     Physical Exam: Vitals:   02/03/18 1402  BP: 104/70  Pulse: (!) 112  Temp: 97.9 F (36.6 C)  TempSrc: Oral  SpO2: 97%  Weight: 106 lb 14.4 oz (48.5 kg)  Height: 5\' 8"  (1.727 m)    Physical Exam  Constitutional: He is oriented to person, place, and time. He appears well-developed. No distress.  Cachetic  appearing male  Cardiovascular: Normal rate, regular rhythm and normal heart sounds.  Respiratory: Effort normal. No respiratory distress. He has no wheezes. He has no rales.  Distant breath sounds  GI: Soft. Bowel sounds are normal. He exhibits no distension. There is tenderness (tenderness to deep palpation on RUQ). There is no rebound and no guarding.  Musculoskeletal: Normal range of motion. He exhibits no edema, tenderness or deformity.  Neurological: He is alert and oriented to person, place, and time. He has normal reflexes. No cranial nerve deficit.      Assessment & Plan:   COPD with emphysema [facility](HCC) [name] presents for management of his Dulera. He is continuing to smoke and has significant dyspnea. He is currently on ICS/LABA regimen when he should also have a LAMA. His new prescription did not go through 2/2 issues with certification for medicaid. We will give sample today in office to see if the addition of LAMA significantly improves his symptoms.  - Start Incruse Ellipta 1 puff daily - C/w Dulera 2 puffs bid - Re-ordered [name] as requested - Counseled on smoking cessation - Low-dose CT to screen for lung cancer    Patient seen with Dr. Heide SparkNarendra   -Judeth CornfieldJoshua Chakara Bognar, PGY1

## 2018-02-03 NOTE — Patient Instructions (Addendum)
[name]  Thank you for coming in to the clinic. Here are the new recommendations based on what we discussed today:  Start using umeclidinium ellipta 1 puff daily Continue to use your dulera Please use albuterol inhaler as needed

## 2018-02-04 NOTE — Assessment & Plan Note (Signed)
[name] presents for management of his Dulera. He is continuing to smoke and has significant dyspnea. He is currently on ICS/LABA regimen when he should also have a LAMA. His new prescription did not go through 2/2 issues with certification for medicaid. We will give sample today in office to see if the addition of LAMA significantly improves his symptoms.  - Start Incruse Ellipta 1 puff daily - C/w Dulera 2 puffs bid - Re-ordered proventil as requested - Counseled on smoking cessation - Low-dose CT to screen for lung cancer

## 2018-02-04 NOTE — Progress Notes (Signed)
Internal Medicine Clinic Attending  I saw and evaluated the patient.  I personally confirmed the key portions of the history and exam documented by Dr. Lee and I reviewed pertinent patient test results.  The assessment, diagnosis, and plan were formulated together and I agree with the documentation in the resident's note.  

## 2018-02-04 NOTE — Telephone Encounter (Signed)
This is being addressed in separate phone note from 01/28/2018. Kinnie FeilL. Jenni Thew, RN, BSN

## 2018-02-04 NOTE — Addendum Note (Signed)
Addended by: Neomia DearPOWERS, Mavi Un E on: 02/04/2018 07:47 PM   Modules accepted: Orders

## 2018-02-26 ENCOUNTER — Other Ambulatory Visit: Payer: Self-pay | Admitting: Internal Medicine

## 2018-02-26 DIAGNOSIS — K860 Alcohol-induced chronic pancreatitis: Secondary | ICD-10-CM

## 2018-02-26 NOTE — Telephone Encounter (Signed)
Last refilled 01/26/18.

## 2018-02-26 NOTE — Telephone Encounter (Signed)
Refill Request for [facility] [id number].  Patient requesting to use this pharmacy for now on for all of his Prescriptions.  Please call patient if questions.  traMADol (ULTRAM) 50 MG tablet

## 2018-02-27 MED ORDER — TRAMADOL HCL 50 MG PO TABS: 100.0000 mg | ORAL_TABLET | Freq: Two times a day (BID) | ORAL | 0 refills | Status: DC

## 2018-03-02 ENCOUNTER — Telehealth: Payer: Self-pay | Admitting: Internal Medicine

## 2018-03-02 NOTE — Telephone Encounter (Signed)
Regarding Dr Nedra Hai prescription

## 2018-03-02 NOTE — Telephone Encounter (Signed)
Pharmacist requesting PCP's DEA. This was relayed. Kinnie Feil, RN, BSN

## 2018-03-30 ENCOUNTER — Other Ambulatory Visit: Payer: Self-pay

## 2018-03-30 DIAGNOSIS — K860 Alcohol-induced chronic pancreatitis: Secondary | ICD-10-CM

## 2018-03-30 NOTE — Telephone Encounter (Signed)
traMADol (ULTRAM) 50 MG tablet   Refill request @  [facility] at [place] - [place], [place] - [address] [phone] (Phone) [phone] (Fax)    

## 2018-04-02 MED ORDER — TRAMADOL HCL 50 MG PO TABS: 100.0000 mg | ORAL_TABLET | Freq: Two times a day (BID) | ORAL | 0 refills | Status: DC

## 2018-04-02 NOTE — Telephone Encounter (Signed)
Pt returning call about his Tramadol.  Pt called on 03/30/2018 and is now taken his last pill.  Please call the patient.

## 2018-04-03 NOTE — Telephone Encounter (Signed)
Unable to sch with Dr Nedra Hai for October b/c of Night Float and no appt ava in  November. HE is booked.  Would you like for the patient to be seen in the [facility] clinic instead?

## 2018-04-07 ENCOUNTER — Telehealth: Payer: Self-pay | Admitting: Internal Medicine

## 2018-04-07 DIAGNOSIS — L400 Psoriasis vulgaris: Secondary | ICD-10-CM | POA: Diagnosis not present

## 2018-04-07 NOTE — Telephone Encounter (Signed)
Pt missed called, pls contact back [phone]

## 2018-04-08 NOTE — Telephone Encounter (Signed)
I did not place a call  

## 2018-04-10 DIAGNOSIS — L400 Psoriasis vulgaris: Secondary | ICD-10-CM | POA: Diagnosis not present

## 2018-04-17 ENCOUNTER — Ambulatory Visit: Payer: Medicaid Other

## 2018-05-04 ENCOUNTER — Other Ambulatory Visit: Payer: Self-pay | Admitting: Internal Medicine

## 2018-05-04 DIAGNOSIS — K860 Alcohol-induced chronic pancreatitis: Secondary | ICD-10-CM

## 2018-05-04 NOTE — Telephone Encounter (Signed)
Last rx written  04/02/18. Last OV 8/13. Next OV  12/17. UDS 10/28/17.

## 2018-05-04 NOTE — Telephone Encounter (Signed)
I called it in to the pharmacy thank you.

## 2018-05-04 NOTE — Telephone Encounter (Signed)
traMADol (ULTRAM) 50 MG tablet   Refill request @  [facility] at [place] - [place], [place] - [address] [phone] (Phone) [phone] (Fax)    

## 2018-05-15 ENCOUNTER — Telehealth: Payer: Self-pay | Admitting: *Deleted

## 2018-05-15 ENCOUNTER — Ambulatory Visit (HOSPITAL_COMMUNITY)
Admission: RE | Admit: 2018-05-15 | Discharge: 2018-05-15 | Disposition: A | Discharge status: Home / Self Care | Payer: Medicaid Other | Source: Ambulatory Visit | Attending: Internal Medicine | Admitting: Internal Medicine

## 2018-05-15 DIAGNOSIS — Z720 Tobacco use: Secondary | ICD-10-CM | POA: Diagnosis not present

## 2018-05-15 DIAGNOSIS — R918 Other nonspecific abnormal finding of lung field: Secondary | ICD-10-CM

## 2018-05-15 NOTE — Telephone Encounter (Signed)
Spoke with patient on the phone about imaging results. Will make referral to pulmonology as patient is high risk and nodule is >548mm. Patient agrees with the plan

## 2018-05-15 NOTE — Telephone Encounter (Signed)
[facility] call from  [facility]  Chest CT results:  1. Lung-RADS 4B, suspicious. Dominant irregular solid medial left lower lobe pulmonary nodule measuring 19.7 mm in volume derived mean diameter. Additional imaging evaluation or consultation with Pulmonology or Thoracic Surgery recommended. (See full report under imaging)  Will forward info to ordering MD, please advise.Criss AlvineGoldston, Jayelle Page Cassady11/22/201911:04 AM

## 2018-05-22 ENCOUNTER — Encounter (HOSPITAL_COMMUNITY): Payer: Self-pay | Admitting: Emergency Medicine

## 2018-05-22 ENCOUNTER — Telehealth: Payer: Self-pay | Admitting: Internal Medicine

## 2018-05-22 ENCOUNTER — Emergency Department (HOSPITAL_COMMUNITY)
Admission: EM | Admit: 2018-05-22 | Discharge: 2018-05-22 | Disposition: A | Discharge status: Home / Self Care | Payer: Medicaid Other | Attending: Emergency Medicine | Admitting: Emergency Medicine

## 2018-05-22 ENCOUNTER — Other Ambulatory Visit: Payer: Self-pay

## 2018-05-22 ENCOUNTER — Emergency Department (HOSPITAL_COMMUNITY): Payer: Medicaid Other

## 2018-05-22 DIAGNOSIS — J80 Acute respiratory distress syndrome: Secondary | ICD-10-CM | POA: Diagnosis not present

## 2018-05-22 DIAGNOSIS — Z79899 Other long term (current) drug therapy: Secondary | ICD-10-CM | POA: Diagnosis not present

## 2018-05-22 DIAGNOSIS — W19XXXA Unspecified fall, initial encounter: Secondary | ICD-10-CM | POA: Diagnosis not present

## 2018-05-22 DIAGNOSIS — R0602 Shortness of breath: Secondary | ICD-10-CM | POA: Diagnosis not present

## 2018-05-22 DIAGNOSIS — J209 Acute bronchitis, unspecified: Secondary | ICD-10-CM

## 2018-05-22 DIAGNOSIS — J449 Chronic obstructive pulmonary disease, unspecified: Secondary | ICD-10-CM | POA: Insufficient documentation

## 2018-05-22 DIAGNOSIS — J439 Emphysema, unspecified: Secondary | ICD-10-CM

## 2018-05-22 DIAGNOSIS — R0600 Dyspnea, unspecified: Secondary | ICD-10-CM | POA: Insufficient documentation

## 2018-05-22 DIAGNOSIS — F17210 Nicotine dependence, cigarettes, uncomplicated: Secondary | ICD-10-CM | POA: Diagnosis not present

## 2018-05-22 DIAGNOSIS — J40 Bronchitis, not specified as acute or chronic: Secondary | ICD-10-CM | POA: Diagnosis not present

## 2018-05-22 DIAGNOSIS — R05 Cough: Secondary | ICD-10-CM | POA: Diagnosis not present

## 2018-05-22 DIAGNOSIS — R001 Bradycardia, unspecified: Secondary | ICD-10-CM | POA: Diagnosis not present

## 2018-05-22 LAB — CBC WITH DIFFERENTIAL/PLATELET
Abs Immature Granulocytes: 0.04 10*3/uL (ref 0.00–0.07)
Basophils Absolute: 0 10*3/uL (ref 0.0–0.1)
Basophils Relative: 0 %
EOS PCT: 1 %
Eosinophils Absolute: 0.1 10*3/uL (ref 0.0–0.5)
HCT: 46.6 % (ref 39.0–52.0)
Hemoglobin: 15 g/dL (ref 13.0–17.0)
Immature Granulocytes: 0 %
Lymphocytes Relative: 11 %
Lymphs Abs: 1.2 10*3/uL (ref 0.7–4.0)
MCH: 29.8 pg (ref 26.0–34.0)
MCHC: 32.2 g/dL (ref 30.0–36.0)
MCV: 92.6 fL (ref 80.0–100.0)
MONOS PCT: 6 %
Monocytes Absolute: 0.7 10*3/uL (ref 0.1–1.0)
Neutro Abs: 9.2 10*3/uL — ABNORMAL HIGH (ref 1.7–7.7)
Neutrophils Relative %: 82 %
Platelets: 200 10*3/uL (ref 150–400)
RBC: 5.03 MIL/uL (ref 4.22–5.81)
RDW: 13.1 % (ref 11.5–15.5)
WBC: 11.2 10*3/uL — ABNORMAL HIGH (ref 4.0–10.5)
nRBC: 0 % (ref 0.0–0.2)

## 2018-05-22 LAB — COMPREHENSIVE METABOLIC PANEL
ALT: 14 U/L (ref 0–44)
AST: 28 U/L (ref 15–41)
Albumin: 4 g/dL (ref 3.5–5.0)
Alkaline Phosphatase: 115 U/L (ref 38–126)
Anion gap: 12 (ref 5–15)
BUN: 18 mg/dL (ref 6–20)
CO2: 24 mmol/L (ref 22–32)
Calcium: 9.5 mg/dL (ref 8.9–10.3)
Chloride: 104 mmol/L (ref 98–111)
Creatinine, Ser: 0.83 mg/dL (ref 0.61–1.24)
GFR calc Af Amer: >60 mL/min (ref >60)
GLUCOSE: 91 mg/dL (ref 70–99)
Potassium: 3.6 mmol/L (ref 3.5–5.1)
Sodium: 140 mmol/L (ref 135–145)
Total Bilirubin: 0.5 mg/dL (ref 0.3–1.2)
Total Protein: 7.8 g/dL (ref 6.5–8.1)

## 2018-05-22 LAB — TROPONIN I: Troponin I: <0.03 ng/mL (ref <0.03)

## 2018-05-22 LAB — LIPASE, BLOOD: Lipase: 31 U/L (ref 11–51)

## 2018-05-22 MED ORDER — SODIUM CHLORIDE 0.9 % IV BOLUS
500.0000 mL | Freq: Once | INTRAVENOUS | Status: AC
  Administered 2018-05-22: 500 mL via INTRAVENOUS

## 2018-05-22 MED ORDER — ALBUTEROL SULFATE HFA 108 (90 BASE) MCG/ACT IN AERS: 1.0000 | INHALATION_SPRAY | Freq: Four times a day (QID) | RESPIRATORY_TRACT | 2 refills | Status: DC | PRN

## 2018-05-22 MED ORDER — AMOXICILLIN-POT CLAVULANATE 875-125 MG PO TABS
1.0000 | ORAL_TABLET | Freq: Once | ORAL | Status: AC
  Administered 2018-05-22: 1 via ORAL
  Filled 2018-05-22: qty 1

## 2018-05-22 MED ORDER — AMOXICILLIN-POT CLAVULANATE 875-125 MG PO TABS: 1.0000 | ORAL_TABLET | Freq: Two times a day (BID) | ORAL | 0 refills | Status: DC

## 2018-05-22 MED ORDER — IOPAMIDOL (ISOVUE-370) INJECTION 76%
INTRAVENOUS | Status: AC
  Filled 2018-05-22: qty 100

## 2018-05-22 MED ORDER — ALBUTEROL SULFATE (2.5 MG/3ML) 0.083% IN NEBU: 2.5000 mg | INHALATION_SOLUTION | Freq: Four times a day (QID) | RESPIRATORY_TRACT | 0 refills | Status: DC | PRN

## 2018-05-22 MED ORDER — OXYCODONE-ACETAMINOPHEN 5-325 MG PO TABS
1.0000 | ORAL_TABLET | Freq: Once | ORAL | Status: AC
  Administered 2018-05-22: 1 via ORAL
  Filled 2018-05-22: qty 1

## 2018-05-22 MED ORDER — PREDNISONE 20 MG PO TABS: ORAL_TABLET | ORAL | 0 refills | Status: DC

## 2018-05-22 MED ORDER — ALBUTEROL SULFATE (2.5 MG/3ML) 0.083% IN NEBU
5.0000 mg | INHALATION_SOLUTION | Freq: Once | RESPIRATORY_TRACT | Status: AC
  Administered 2018-05-22: 5 mg via RESPIRATORY_TRACT
  Filled 2018-05-22: qty 6

## 2018-05-22 MED ORDER — IOPAMIDOL (ISOVUE-370) INJECTION 76%
75.0000 mL | Freq: Once | INTRAVENOUS | Status: AC | PRN
  Administered 2018-05-22: 100 mL via INTRAVENOUS

## 2018-05-22 MED ORDER — PREDNISONE 20 MG PO TABS
60.0000 mg | ORAL_TABLET | Freq: Once | ORAL | Status: AC
  Administered 2018-05-22: 60 mg via ORAL
  Filled 2018-05-22: qty 3

## 2018-05-22 NOTE — ED Provider Notes (Signed)
4:37 PM Care transferred to me.  Patient CT scan shows stable nodules but also a small infectious versus inflammatory nodule as well.  Given his worsening cough and shortness of breath I will treat this is possibly infectious.  On my exam, he has diffusely decreased breath sounds.  While he is not having increased work of breathing, with some small wheezing at think is reasonable to give him albuterol and see if this improves his symptoms. Will give prednisone given emphysema and current dyspnea. After this, he can be discharged home to f/u with PCP.   5:11 PM Patient is feeling better after albuterol treatment.  He still has some wheezing but is opened up a little more.  I stressed the importance of continuing albuterol treatments at home and will also treat with prednisone and antibiotics.  Follow-up closely with PCP.  He feels well enough to go home at this point.  We discussed return precautions.   Results for orders placed or performed during the hospital encounter of 05/22/18  CBC with Differential  Result Value Ref Range   WBC 11.2 (H) 4.0 - 10.5 K/uL   RBC 5.03 4.22 - 5.81 MIL/uL   Hemoglobin 15.0 13.0 - 17.0 g/dL   HCT 95.246.6 84.139.0 - 32.452.0 %   MCV 92.6 80.0 - 100.0 fL   MCH 29.8 26.0 - 34.0 pg   MCHC 32.2 30.0 - 36.0 g/dL   RDW 40.113.1 02.711.5 - 25.315.5 %   Platelets 200 150 - 400 K/uL   nRBC 0.0 0.0 - 0.2 %   Neutrophils Relative % 82 %   Neutro Abs 9.2 (H) 1.7 - 7.7 K/uL   Lymphocytes Relative 11 %   Lymphs Abs 1.2 0.7 - 4.0 K/uL   Monocytes Relative 6 %   Monocytes Absolute 0.7 0.1 - 1.0 K/uL   Eosinophils Relative 1 %   Eosinophils Absolute 0.1 0.0 - 0.5 K/uL   Basophils Relative 0 %   Basophils Absolute 0.0 0.0 - 0.1 K/uL   Immature Granulocytes 0 %   Abs Immature Granulocytes 0.04 0.00 - 0.07 K/uL  Comprehensive metabolic panel  Result Value Ref Range   Sodium 140 135 - 145 mmol/L   Potassium 3.6 3.5 - 5.1 mmol/L   Chloride 104 98 - 111 mmol/L   CO2 24 22 - 32 mmol/L   Glucose,  Bld 91 70 - 99 mg/dL   BUN 18 6 - 20 mg/dL   Creatinine, Ser 6.640.83 0.61 - 1.24 mg/dL   Calcium 9.5 8.9 - 40.310.3 mg/dL   Total Protein 7.8 6.5 - 8.1 g/dL   Albumin 4.0 3.5 - 5.0 g/dL   AST 28 15 - 41 U/L   ALT 14 0 - 44 U/L   Alkaline Phosphatase 115 38 - 126 U/L   Total Bilirubin 0.5 0.3 - 1.2 mg/dL   GFR calc non Af Amer >60 >60 mL/min   GFR calc Af Amer >60 >60 mL/min   Anion gap 12 5 - 15  Lipase, blood  Result Value Ref Range   Lipase 31 11 - 51 U/L  Troponin I - Once  Result Value Ref Range   Troponin I <0.03 <0.03 ng/mL   Dg Chest 2 View  Result Date: 05/22/2018 CLINICAL DATA:  Shortness of breath EXAM: CHEST - 2 VIEW COMPARISON:  Chest CT May 15, 2018; chest radiograph June 19, 2017 FINDINGS: There is underlying emphysematous change with lower lobe bronchiectatic change and scarring. The nodular lesions seen medially in the left lower  lobe on CT is not appreciable by radiography. The heart size is normal. Diminished pulmonary vascularity to the upper lobes is consistent with emphysema and stable. No adenopathy. No bone lesions. IMPRESSION: Underlying emphysematous change. Scarring and lower lobe bronchiectatic changes noted. Mass in left lower lobe medially seen on recent CT examination is not appreciable by radiography. Heart size normal. No adenopathy evident. Emphysema (ICD10-J43.9). Electronically Signed   By: Bretta Bang III M.D.   On: 05/22/2018 11:31   Ct Angio Chest Pe W And/or Wo Contrast  Result Date: 05/22/2018 CLINICAL DATA:  Shortness of breath and cough 2-3 days. Recent diagnosis of left lung mass. EXAM: CT ANGIOGRAPHY CHEST WITH CONTRAST TECHNIQUE: Multidetector CT imaging of the chest was performed using the standard protocol during bolus administration of intravenous contrast. Multiplanar CT image reconstructions and MIPs were obtained to evaluate the vascular anatomy. CONTRAST:  70 mL ISOVUE-370 IOPAMIDOL (ISOVUE-370) INJECTION 76% COMPARISON:   05/15/2018, 07/25/2006 FINDINGS: Cardiovascular: Heart is normal size. Minimal calcified plaque over the left main, left anterior descending and right coronary arteries. Very minimal calcified plaque over the thoracic aorta. Pulmonary arterial system is well opacified without emboli. Remaining vascular structures are unremarkable. Mediastinum/Nodes: No mediastinal or hilar adenopathy. Lungs/Pleura: Lungs are adequately inflated with severe emphysematous disease including moderate bullous emphysematous changes over the anterior right mid to upper lung. Subtle patchy reticulonodular density over the posterior right upper lobe. Minimal linear scarring posterior right lower lobe. 2 mm nodule posteromedial right lower lobe without significant change. Stable 7 x 5 mm nodule over the anterior lingula. Subtle patchy reticulonodular density over the lingula. Stable irregular 1.2 x 2.1 cm nodule over the posteromedial left lower lobe. Airways are unremarkable. No consolidation or effusion. Upper Abdomen: Previous cholecystectomy. Numerous calcifications over the pancreatic head. 5 mm nonobstructing stone over the upper pole right kidney unchanged. Musculoskeletal: Minimal degenerative change of the spine. Review of the MIP images confirms the above findings. IMPRESSION: Stable irregular 1.2 x 2.1 cm nodule over the left lower lobe. No mediastinal/hilar adenopathy. Severe emphysematous disease with moderate bullous emphysematous disease over the anterior right mid to upper lung. Few small subcentimeter stable pulmonary nodules as described. Subtle patchy reticulonodular opacification which may be due to an acute infectious, atypical infectious or inflammatory process. Aortic Atherosclerosis (ICD10-I70.0). Mild atherosclerotic coronary artery disease. Stable nonobstructing 5 mm right renal stone. Calcification over the pancreatic head likely sequelae from chronic pancreatitis. Electronically Signed   By: Elberta Fortis M.D.    On: 05/22/2018 16:10   Ct Chest Lung Ca Screen Low Dose W/o Cm  Result Date: 05/15/2018 CLINICAL DATA:  57 year old asymptomatic male current smoker with 84 pack-year smoking history. EXAM: CT CHEST WITHOUT CONTRAST LOW-DOSE FOR LUNG CANCER SCREENING TECHNIQUE: Multidetector CT imaging of the chest was performed following the standard protocol without IV contrast. COMPARISON:  06/19/2017 chest radiograph.  07/25/2006 chest CT. FINDINGS: Cardiovascular: Normal heart size. Trace pericardial effusion/thickening. Left anterior descending and right coronary atherosclerosis. Atherosclerotic nonaneurysmal thoracic aorta. Normal caliber pulmonary arteries. Mediastinum/Nodes: No discrete thyroid nodules. Unremarkable esophagus. No pathologically enlarged axillary, mediastinal or hilar lymph nodes, noting limited sensitivity for the detection of hilar adenopathy on this noncontrast study. Lungs/Pleura: No pneumothorax. No pleural effusion. Severe centrilobular and paraseptal emphysema with diffuse bronchial wall thickening, with bullous emphysema in the anterior right mid lung. There are a few solid pulmonary nodules scattered in both lungs, the largest an irregular solid medial left lower lobe pulmonary nodule measuring 19.7 mm in volume derived mean diameter (  series 4/image 221), which is new since 2008 chest CT. Upper abdomen: Nonobstructing 4 mm upper right renal stone. Musculoskeletal: No aggressive appearing focal osseous lesions. Mild thoracic spondylosis. IMPRESSION: 1. Lung-RADS 4B, suspicious. Dominant irregular solid medial left lower lobe pulmonary nodule measuring 19.7 mm in volume derived mean diameter. Additional imaging evaluation or consultation with Pulmonology or Thoracic Surgery recommended. 2. Two vessel coronary atherosclerosis. 3. Nonobstructing right nephrolithiasis. Aortic Atherosclerosis (ICD10-I70.0) and Emphysema (ICD10-J43.9). These results will be called to the ordering clinician or  representative by the Radiologist Assistant, and communication documented in the PACS or zVision Dashboard. Electronically Signed   By: Delbert Phenix M.D.   On: 05/15/2018 10:46      Pricilla Loveless, MD 05/22/18 1711

## 2018-05-22 NOTE — Discharge Instructions (Signed)
Use your albuterol every 4 hours for the next 48 hours at least.  If you need it more than this, develop fever, vomiting, chest pain, or worsening shortness of breath, or any other new/concerning symptoms and return to the ER for evaluation.

## 2018-05-22 NOTE — ED Notes (Signed)
Ambulated pt on room air. SPO2 dropped to 94% and pt became SOB. Resolved with rest pt 97% on room air

## 2018-05-22 NOTE — ED Provider Notes (Signed)
[facility] EMERGENCY DEPARTMENT Provider Note   CSN: [id number] Arrival date & time: 05/22/18  1053     History   Chief Complaint Chief Complaint  Patient presents with  . Shortness of Breath    HPI [name] is a 57 y.o. male.  HPI Patient presents with shortness of breath.  Shortness of breath the last couple days.  Has had cough with mild sputum production.  States that he feels as if there is something to cough up but nothing is coming up.  Recently had CT scan done through the internal medicine clinic that showed a new lung mass on the left side.  Also had right-sided collapsed lung a few months ago per patient.  States he needed a chest tube at that time.  He has left-sided chest pain also.  Also with all the coughing states his chronic pancreatitis is been hurting more.  Pain is in his mid abdomen Past Medical History:  Diagnosis Date  . Chronic pancreatitis (HCC)   . Depression   . Duodenitis    by EGD 1/07  . Emphysema    unclear diagnosis noted in previous EMR  . History of alcohol abuse   . Hyperlipidemia   . Psoriasis    extensive, responds to strong steroids, unable to start molecular therapies due to cost  . RUE weakness    2/2 cervical spondylosis  . Stomach ulcer     Patient Active Problem List   Diagnosis Date Noted  . S/P chest tube placement (HCC)   . COPD with emphysema (HCC)   . Tobacco abuse   . History of pneumothorax 05/19/2017  . Chronic cholecystitis with calculus s/p lap cholecystectomy 11/28/2016 11/28/2016  . Alcoholic fatty liver 11/28/2016  . Psoriasis   . Duodenitis   . Chronic pancreatitis (HCC) 04/13/2008  . Dyslipidemia 08/13/2007    Past Surgical History:  Procedure Laterality Date  . ESOPHAGOGASTRODUODENOSCOPY (EGD) WITH PROPOFOL N/A 12/04/2016   Procedure: ESOPHAGOGASTRODUODENOSCOPY (EGD) WITH PROPOFOL;  Surgeon: Kathi Der, MD;  Location: [facility];  Service: Gastroenterology;  Laterality:  N/A;  . LAPAROSCOPIC CHOLECYSTECTOMY  11/28/2016   w/IOC  . LAPAROSCOPIC CHOLECYSTECTOMY SINGLE SITE WITH INTRAOPERATIVE CHOLANGIOGRAM N/A 11/28/2016   Procedure: LAPAROSCOPIC CHOLECYSTECTOMY SINGLE SITE WITH INTRAOPERATIVE CHOLANGIOGRAM;  Surgeon: Karie Soda, MD;  Location: [facility] ORS;  Service: General;  Laterality: N/A;  . LIVER BIOPSY N/A 11/28/2016   Procedure: NEEDLE CORE LIVER BIOPSY;  Surgeon: Karie Soda, MD;  Location: [facility] ORS;  Service: General;  Laterality: N/A;  . TYMPANOSTOMY TUBE PLACEMENT Bilateral ~ 1970        Home Medications    Prior to Admission medications   Medication Sig Start Date End Date Taking? Authorizing Provider  albuterol (PROVENTIL HFA;VENTOLIN HFA) 108 (90 Base) MCG/ACT inhaler Inhale 1-2 puffs into the lungs every 6 (six) hours as needed for wheezing or shortness of breath. 02/03/18   Earl Lagos, MD  amitriptyline (ELAVIL) 10 MG tablet Take 2 tablets (20 mg total) by mouth at bedtime. Can increase to two tablets after 2 weeks 10/28/17   Ginger Carne, MD  atorvastatin (LIPITOR) 40 MG tablet Take 1 tablet (40 mg total) by mouth daily. 07/07/17 10/05/17  Ginger Carne, MD  folic acid (FOLVITE) 1 MG tablet Take 1 tablet (1 mg total) by mouth daily. 08/26/17 08/26/18  Ginger Carne, MD  methotrexate (RHEUMATREX) 2.5 MG tablet 7.5 mg once a week. 11/06/17   [provider]  mometasone-formoterol (DULERA) 200-5 MCG/ACT AERO Inhale 2  puffs into the lungs 2 (two) times daily. 12/02/17   Ginger Carne, MD  pantoprazole (PROTONIX) 40 MG tablet Take 1 tablet (40 mg total) by mouth daily. 07/07/17   Ginger Carne, MD  promethazine (PHENERGAN) 25 MG tablet Take 1 tablet (25 mg total) by mouth every 6 (six) hours as needed for nausea or vomiting. Patient not taking: Reported on 06/19/2017 05/24/17   Drema Dallas, MD  traMADol (ULTRAM) 50 MG tablet TAKE 2 TABLETS (100 MG TOTAL) BY MOUTH 2 (TWO) TIMES DAILY. 05/04/18   Theotis Barrio, MD  triamcinolone cream (KENALOG)  0.1 % Apply 1 application topically 2 (two) times daily. 07/07/17   Ginger Carne, MD  umeclidinium bromide (INCRUSE ELLIPTA) 62.5 MCG/INH AEPB Inhale 1 puff into the lungs daily. 02/03/18   Earl Lagos, MD    Family History Family History  Problem Relation Age of Onset  . Diabetes Mother   . Heart disease Father   . Cancer Father        ?kidney or liver cancer    Social History Social History   Tobacco Use  . Smoking status: Current Every Day Smoker    Packs/day: 0.30    Years: 40.00    Pack years: 12.00    Types: Cigarettes  . Smokeless tobacco: Never Used  . Tobacco comment: 5 cigs/day.  Substance Use Topics  . Alcohol use: No    Frequency: Never    Comment: quit x 10 yrs.  . Drug use: No    Types: Marijuana    Comment: quit in high school     Allergies   Nsaids   Review of Systems Review of Systems  Constitutional: Positive for appetite change.  HENT: Positive for congestion.   Respiratory: Positive for cough and shortness of breath.   Cardiovascular: Positive for chest pain.  Gastrointestinal: Positive for abdominal pain and nausea.  Genitourinary: Negative for flank pain.  Musculoskeletal: Negative for back pain.  Skin: Negative for rash.  Neurological: Negative for weakness.  Hematological: Negative for adenopathy.  Psychiatric/Behavioral: Negative for confusion.     Physical Exam Updated Vital Signs BP 93/67   Pulse 77   Temp 97.6 F (36.4 C) (Oral)   Resp 12   Ht 5\' 8"  (1.727 m)   Wt 54.4 kg   SpO2 100%   BMI 18.25 kg/m   Physical Exam  Constitutional: He appears well-developed.  HENT:  Head: Atraumatic.  Eyes: Pupils are equal, round, and reactive to light.  Cardiovascular: Regular rhythm.  Pulmonary/Chest:  Mildly harsh breath sounds were equal bilaterally.  Abdominal: There is tenderness.  Upper abdominal tenderness without rebound or guarding.  Musculoskeletal:       Right lower leg: He exhibits no tenderness.       Left  lower leg: He exhibits no tenderness.  Neurological: He is alert.  Skin: Skin is warm. Capillary refill takes less than 2 seconds.     ED Treatments / Results  Labs (all labs ordered are listed, but only abnormal results are displayed) Labs Reviewed  CBC WITH DIFFERENTIAL/PLATELET - Abnormal; Notable for the following components:      Result Value   WBC 11.2 (*)    Neutro Abs 9.2 (*)    All other components within normal limits  COMPREHENSIVE METABOLIC PANEL  LIPASE, BLOOD  TROPONIN I    EKG EKG Interpretation  Date/Time:  Friday May 22 2018 11:54:22 EST Ventricular Rate:  81 PR Interval:    QRS Duration: 96 QT Interval:  489 QTC Calculation: 568 R Axis:   104 Text Interpretation:  Sinus rhythm Right axis deviation Prolonged QT interval Confirmed by Benjiman CorePickering, Gottfried Standish [phone]([id number]) on 05/22/2018 12:14:27 PM   Radiology Dg Chest 2 View  Result Date: 05/22/2018 CLINICAL DATA:  Shortness of breath EXAM: CHEST - 2 VIEW COMPARISON:  Chest CT May 15, 2018; chest radiograph June 19, 2017 FINDINGS: There is underlying emphysematous change with lower lobe bronchiectatic change and scarring. The nodular lesions seen medially in the left lower lobe on CT is not appreciable by radiography. The heart size is normal. Diminished pulmonary vascularity to the upper lobes is consistent with emphysema and stable. No adenopathy. No bone lesions. IMPRESSION: Underlying emphysematous change. Scarring and lower lobe bronchiectatic changes noted. Mass in left lower lobe medially seen on recent CT examination is not appreciable by radiography. Heart size normal. No adenopathy evident. Emphysema (ICD10-J43.9). Electronically Signed   By: Bretta BangWilliam  Woodruff III M.D.   On: 05/22/2018 11:31    Procedures Procedures (including critical care time)  Medications Ordered in ED Medications  iopamidol (ISOVUE-370) 76 % injection (has no administration in time range)  iopamidol (ISOVUE-370) 76 %  injection 75 mL (has no administration in time range)  sodium chloride 0.9 % bolus 500 mL (0 mLs Intravenous Stopped 05/22/18 1250)  oxyCODONE-acetaminophen (PERCOCET/ROXICET) 5-325 MG per tablet 1 tablet (1 tablet Oral Given 05/22/18 1300)     Initial Impression / Assessment and Plan / ED Course  I have reviewed the triage vital signs and the nursing notes.  Pertinent labs & imaging results that were available during my care of the patient were reviewed by me and considered in my medical decision making (see chart for details).     Patient with dyspnea shortness of breath.  Has recent finding of lung mass on CT.  Not been further worked up yet.  Also previous right-sided pneumothorax.  X-ray is stable and actually cannot see the mass, however with known palpable malignancy and worsening shortness of breath will get CT PE to look for pulmonary embolism.  Care turned over to Dr. Criss AlvineGoldston.  Final Clinical Impressions(s) / ED Diagnoses   Final diagnoses:  Dyspnea, unspecified type    ED Discharge Orders    None       Benjiman CorePickering, Carver Murakami, MD 05/22/18 1447

## 2018-05-22 NOTE — Telephone Encounter (Signed)
Received a call from patient's mother Ms. Alice Bean stating she is concerned for her son. She states he was [facility] few months ago at which time a chest CT was ordered for lung cancer screening. He had this test done yesterday and was told he has a mass in the L lung. On review of chart, I see he underwent a chest CT on 11/22 that showed a dominant irregular solid medial LLL pulmonary nodule measuring 19.807mm as well as 2 vessel coronary atherosclerosis, and non-obstructing nephrolithiasis. Ms. Jillyn HiddenBean states her son is not breathing well and is unable to talk. She is concerned he will die. I strongly advice Ms. Bean to call 911 as patient may need urgent assessment by EMS and be brought to the ED for further evaluation.   Burna CashIdalys Santos-Sanchez, MD  Internal Medicine PGY-2  P [phone]

## 2018-05-22 NOTE — ED Notes (Signed)
Pt placed on 2L of O2 for comfort.   

## 2018-05-22 NOTE — ED Triage Notes (Signed)
Per EMS Pt from home called out with SOB.  Pt has recent history of mass on left lung that we CT and found out last week. As well as Colasped Right lung 1 month ago.  Pt is AOx4 and was 99% RA NAD noted at this time

## 2018-05-25 ENCOUNTER — Ambulatory Visit: Payer: Medicaid Other | Admitting: Internal Medicine

## 2018-05-25 ENCOUNTER — Encounter: Payer: Self-pay | Admitting: Internal Medicine

## 2018-05-25 ENCOUNTER — Other Ambulatory Visit: Payer: Self-pay

## 2018-05-25 VITALS — BP 85/47 | HR 93 | Temp 97.4°F | Ht 68.0 in | Wt 110.7 lb

## 2018-05-25 DIAGNOSIS — R911 Solitary pulmonary nodule: Secondary | ICD-10-CM

## 2018-05-25 DIAGNOSIS — J209 Acute bronchitis, unspecified: Secondary | ICD-10-CM

## 2018-05-25 MED ORDER — GUAIFENESIN-CODEINE 100-10 MG/5ML PO SOLN: 10.0000 mL | Freq: Four times a day (QID) | ORAL | 0 refills | Status: DC | PRN

## 2018-05-25 NOTE — Patient Instructions (Signed)
[name],  It was a pleasure meeting you today. A referral has been made to a lung doctor.  Please take the cough syrup every 6 hours as needed for your symptoms.

## 2018-05-25 NOTE — Progress Notes (Signed)
   CC: ER follow-up for acute bronchitis  HPI:  Mr.Desi T Shelle IronBeane is a 57 y.o. male with history noted below that presents to the acute care clinic for follow-up on acute bronchitis.  Past Medical History:  Diagnosis Date  . Chronic pancreatitis (HCC)   . Depression   . Duodenitis    by EGD 1/07  . Emphysema    unclear diagnosis noted in previous EMR  . History of alcohol abuse   . Hyperlipidemia   . Psoriasis    extensive, responds to strong steroids, unable to start molecular therapies due to cost  . RUE weakness    2/2 cervical spondylosis  . Stomach ulcer     Review of Systems:  Review of Systems  Constitutional: Negative for chills and fever.  Respiratory: Positive for cough. Negative for shortness of breath and wheezing.   Cardiovascular: Negative for chest pain and leg swelling.     Physical Exam:  Vitals:   05/25/18 1349  BP: (!) 85/47  Pulse: 93  Temp: (!) 97.4 F (36.3 C)  TempSrc: Oral  SpO2: 100%  Weight: 110 lb 11.2 oz (50.2 kg)  Height: 5\' 8"  (1.727 m)   Physical Exam  Constitutional:  Chronically ill-appearing male  Cardiovascular: Normal rate, regular rhythm and normal heart sounds. Exam reveals no gallop and no friction rub.  No murmur heard. Pulmonary/Chest: Effort normal and breath sounds normal. No respiratory distress. He has no wheezes. He has no rales.    Assessment & Plan:   See encounters tab for problem based medical decision making.    Patient discussed with Dr. Heide SparkNarendra

## 2018-05-26 DIAGNOSIS — R911 Solitary pulmonary nodule: Secondary | ICD-10-CM | POA: Insufficient documentation

## 2018-05-26 DIAGNOSIS — J209 Acute bronchitis, unspecified: Secondary | ICD-10-CM | POA: Insufficient documentation

## 2018-05-26 NOTE — Assessment & Plan Note (Signed)
Assessment: Left lower lobe lung nodule At the ED visit on 11/29 patient had a CT angio chest that showed a stable irregular 1.2 x 2.1 cm nodule over the left lower lobe.  Referral was placed by PCP and office has been contacted to confirm patient will be contacted and seen.  Plan -Pulmonology referral

## 2018-05-26 NOTE — Assessment & Plan Note (Addendum)
History of present illness: Patient states that he was seen in the ED last week for cough and shortness of breath. He reports improvement in his symptoms but is having difficulty sleeping because of the cough. He was given a course of antibiotics and steroids in the ED and has been taking them daily.    Assessment: Acute bronchitis Patient was started on Augmentin and prednisone on 11/29. He brought in his medications in today and has been taking the medication as prescribed. Lungs sounded clear on exam.  At this time will add guaifenesin and codeine to help with cough.  Plan -Robitussin AC

## 2018-05-27 ENCOUNTER — Ambulatory Visit: Payer: Medicaid Other | Admitting: Pulmonary Disease

## 2018-05-27 ENCOUNTER — Encounter: Payer: Self-pay | Admitting: Pulmonary Disease

## 2018-05-27 VITALS — BP 94/62 | HR 87 | Ht 68.0 in | Wt 110.0 lb

## 2018-05-27 DIAGNOSIS — J432 Centrilobular emphysema: Secondary | ICD-10-CM | POA: Diagnosis not present

## 2018-05-27 MED ORDER — FORMOTEROL FUMARATE 20 MCG/2ML IN NEBU: 20.0000 ug | INHALATION_SOLUTION | Freq: Two times a day (BID) | RESPIRATORY_TRACT | 5 refills | Status: DC

## 2018-05-27 MED ORDER — REVEFENACIN 175 MCG/3ML IN SOLN: 1.0000 | Freq: Every day | RESPIRATORY_TRACT | 5 refills | Status: DC

## 2018-05-27 MED ORDER — BUDESONIDE 0.5 MG/2ML IN SUSP: 0.5000 mg | Freq: Two times a day (BID) | RESPIRATORY_TRACT | 5 refills | Status: DC

## 2018-05-27 NOTE — Progress Notes (Signed)
[facility]  Chief Complaint  Patient presents with  . pulm consult    Pt referred by Dr. Judeth CornfieldJoshua Lee MD. Pt has increase SOB with exertion, wheezing, chest tightness and pain, and productive cough-clear. [facility], there was a mass on the left lung.    Constitutional:  BP 94/62 (BP Location: Left Arm, Cuff Size: Normal)   Pulse 87   Ht 5\' 8"  (1.727 m)   Wt 110 lb (49.9 kg)   SpO2 97%   BMI 16.73 kg/m   Past Medical History:  Chronic pancreatitis, Depression, ETOH, HLD, Psoriasis, PUD, Rt PTX  Brief Summary:  [name] is a 57 y.o. male former smoker with COPD and bullous emphysema.  He smoked cigarettes until about 2 weeks ago.  He smoked up to 1 ppd.  [place]  He is disabled due to chronic pancreatitis.  He denies history of pneumonia or exposure to TB.  No animal/bird exposure.  He was diagnosed with COPD and emphysema years ago.  He had a spontaneous right pneumothorax in 2018.  He has been using his inhalers, but don't feel like they are working any more.  When he was in hospital and used nebulizer medication, he felt better.  He was treat for an exacerbation last week.  He finished prednisone and has two more days of antibiotics.  He gets winded with minimal activity.  He has occasional cough with clear sputum.  He sometimes gets wheezing.  Not having sweats, hemoptysis, or leg swelling.  His weight has been stable.  Physical Exam:   Appearance - thin  ENMT - clear nasal mucosa, midline nasal  septum, no oral exudates, no LAN, trachea midline  Respiratory - decreased breath sounds, no wheeze/rales/dullness  CV - s1s2 regular rate and rhythm, no murmurs, no peripheral edema, radial pulses symmetric  GI - soft, non tender, no masses  Lymph - no adenopathy noted in neck and axillary areas  MSK - normal gait  Ext - no cyanosis, clubbing, or joint inflammation noted  Skin -  changes of psoriasis  Neuro - normal strength, oriented x 3  Psych - normal mood and affect   Assessment/Plan:   COPD with emphysema - I don't think he can adequately using inhaler therapy due to the severity of his COPD, and feel that he would be better served with nebulizer medication - will change him to yupelri, performist, and pulmicort; these will take the place of incruse and dulera - will check A1AT level - will arrange for pulmonary rehab referral and overnight oximetry on room air  Lung nodule. - 19 mm nodule LLL - he would be very high risk for any type of biopsy procedure - he would likely not be a surgical candidate based on his overall health status if was found to have malignancy - will repeat non contrast CT chest in February 2020, and then determine if additional intervention is needed  Psoriasis. - he is on MTX [facility]   Patient Instructions  Will arrange for referral to pulmonary rehab Will arrange for overnight oxygen test Will try to change your inhaler medicines to yupelri, performist, and pulmicort Stop dulera and incruse once you get yupelri, performist and pulmicort Lab test today  Follow up in 4 weeks   Coralyn HellingVineet Diella Gillingham, MD [facility] Pulmonary/Critical Care Pager: [phone] 05/27/2018, 11:49 AM  Flow Sheet     Pulmonary tests:  PFT 07/14/17 >> FEV1 1.69 (48%), FEV1% 41, TLC 7.09 (107%), RV 3.41 (168%), DLCO 32% CT angio chest 05/22/18 >> severe emphysema, moderate bullous area Rt upper lung, 2 mm nodule RLL, 7 mm nodule in lingula, 21 mm nodule LLL  Cardiac tests:  Echo 10/11/16 >> EF 69%, grade 1 DD  Review of Systems:  Constitutional: Positive for fever and unexpected weight change.  HENT: Positive for congestion. Negative for dental problem, ear pain, nosebleeds, postnasal drip, rhinorrhea, sinus pressure, sneezing, sore throat and trouble swallowing.   Eyes: Negative for redness and itching.  Respiratory:  Positive for cough, chest tightness, shortness of breath and wheezing.   Cardiovascular: Positive for chest pain. Negative for palpitations and leg swelling.  Gastrointestinal: Negative for nausea and vomiting.  Genitourinary: Negative for dysuria.  Musculoskeletal: Negative for joint swelling.  Skin: Positive for rash.  Allergic/Immunologic: Positive for environmental allergies. Negative for food allergies and immunocompromised state.  Neurological: Positive for headaches.  Hematological: Does not bruise/bleed easily.  Psychiatric/Behavioral: Positive for dysphoric mood. The patient is nervous/anxious.    Medications:   Allergies as of 05/27/2018      Reactions   Nsaids Other (See Comments)   Bleeding gastritis      Medication List        Accurate as of 05/27/18 11:49 AM. Always use your most recent med list.          albuterol 108 (90 Base) MCG/ACT inhaler Commonly known as:  PROVENTIL HFA;VENTOLIN HFA Inhale 1-2 puffs into the lungs every 6 (six) hours as needed for wheezing or shortness of breath.   albuterol (2.5 MG/3ML) 0.083% nebulizer solution Commonly known as:  PROVENTIL Take 3 mLs (2.5 mg total) by nebulization every 6 (six) hours as needed for wheezing or shortness of breath.   amitriptyline 10 MG tablet Commonly known as:  ELAVIL Take 2 tablets (20 mg total) by mouth at bedtime. Can increase to two tablets after 2 weeks   amoxicillin-clavulanate 875-125 MG tablet Commonly known as:  AUGMENTIN Take 1 tablet by mouth 2 (two) times daily. One po bid x 7 days   atorvastatin 40 MG tablet Commonly known as:  LIPITOR Take 1 tablet (40 mg total) by mouth daily.   budesonide 0.5 MG/2ML nebulizer solution Commonly known as:  PULMICORT Take 2 mLs (0.5 mg total) by nebulization 2 (two) times daily.   folic acid 1 MG tablet Commonly known as:  FOLVITE Take 1 tablet (1 mg total) by mouth daily.   formoterol 20 MCG/2ML nebulizer solution Commonly known as:   PERFOROMIST Take 2 mLs (20 mcg total) by nebulization 2 (two) times daily.   methotrexate 2.5 MG tablet Commonly known as:  RHEUMATREX 7.5 mg once a week.   mometasone-formoterol 200-5 MCG/ACT Aero Commonly known as:  DULERA Inhale 2 puffs into the lungs 2 (two) times daily.   pantoprazole 40 MG tablet Commonly known as:  PROTONIX Take 1 tablet (40 mg total) by mouth daily.   Revefenacin 175 MCG/3ML Soln Inhale 1 vial into the lungs daily.   traMADol 50 MG tablet Commonly known as:  ULTRAM TAKE 2 TABLETS (100 MG TOTAL) BY MOUTH 2 (TWO) TIMES DAILY.   triamcinolone cream 0.1 % Commonly known as:  KENALOG Apply 1 application topically 2 (two) times daily.   umeclidinium bromide 62.5 MCG/INH Aepb Commonly known as:  INCRUSE ELLIPTA Inhale 1 puff into the lungs daily.       Past Surgical History:  He  has a past surgical  history that includes Laparoscopic cholecystectomy single site with intraoperative cholangiogram (N/A, 11/28/2016); Liver biopsy (N/A, 11/28/2016); Laparoscopic cholecystectomy (11/28/2016); Tympanostomy tube placement (Bilateral, ~ 1970); and Esophagogastroduodenoscopy (egd) with propofol (N/A, 12/04/2016).  Family History:  His family history includes Cancer in his father; Diabetes in his mother; Heart disease in his father.  Social History:  He  reports that he quit smoking 8 days ago. His smoking use included cigarettes. He has a 12.00 pack-year smoking history. He has never used smokeless tobacco. He reports that he does not drink alcohol or use drugs.

## 2018-05-27 NOTE — Progress Notes (Signed)
   Subjective:    Patient ID: [name], male    DOB: 31-Oct-1960, 57 y.o.   MRN: [id number]  HPI    Review of Systems  Constitutional: Positive for fever and unexpected weight change.  HENT: Positive for congestion. Negative for dental problem, ear pain, nosebleeds, postnasal drip, rhinorrhea, sinus pressure, sneezing, sore throat and trouble swallowing.   Eyes: Negative for redness and itching.  Respiratory: Positive for cough, chest tightness, shortness of breath and wheezing.   Cardiovascular: Positive for chest pain. Negative for palpitations and leg swelling.  Gastrointestinal: Negative for nausea and vomiting.  Genitourinary: Negative for dysuria.  Musculoskeletal: Negative for joint swelling.  Skin: Positive for rash.  Allergic/Immunologic: Positive for environmental allergies. Negative for food allergies and immunocompromised state.  Neurological: Positive for headaches.  Hematological: Does not bruise/bleed easily.  Psychiatric/Behavioral: Positive for dysphoric mood. The patient is nervous/anxious.        Objective:   Physical Exam        Assessment & Plan:

## 2018-05-27 NOTE — Patient Instructions (Addendum)
Will arrange for referral to pulmonary rehab Will arrange for overnight oxygen test Will try to change your inhaler medicines to yupelri, performist, and pulmicort Stop dulera and incruse once you get yupelri, performist and pulmicort Lab test today  Follow up in 4 weeks

## 2018-05-27 NOTE — Progress Notes (Signed)
Internal Medicine Clinic Attending  Case discussed with Dr. Hoffman at the time of the visit.  We reviewed the resident's history and exam and pertinent patient test results.  I agree with the assessment, diagnosis, and plan of care documented in the resident's note.  

## 2018-05-28 ENCOUNTER — Other Ambulatory Visit: Payer: Self-pay | Admitting: Internal Medicine

## 2018-05-28 ENCOUNTER — Telehealth: Payer: Self-pay | Admitting: Pulmonary Disease

## 2018-05-28 DIAGNOSIS — K860 Alcohol-induced chronic pancreatitis: Secondary | ICD-10-CM

## 2018-05-28 MED ORDER — TRAMADOL HCL 50 MG PO TABS: 100.0000 mg | ORAL_TABLET | Freq: Two times a day (BID) | ORAL | 0 refills | Status: DC

## 2018-05-28 NOTE — Telephone Encounter (Signed)
Needs refill on traMADol (ULTRAM) 50 MG tablet at [facility] ;pt contact [phone]

## 2018-05-28 NOTE — Telephone Encounter (Signed)
Called and spoke to patient mother Fulton Molelice, she states Medicaid will not cover the inhalers without a PA. Pulmicort was approved however Yupelri and Perforomist require a PA.   Call made to [place].   Mikael SprayYupelri PA approved. PA # [id number] 12.5.19-11.29.2020  Perforomist PA approved. PA # [id number] 12.5.2019-11.29.2020.   Call made to Pharmacy to make aware PA's haven been completed. Pharmacy states she will call patient to make aware. Nothing further is needed at this time.

## 2018-05-29 ENCOUNTER — Telehealth: Payer: Self-pay | Admitting: Pulmonary Disease

## 2018-05-29 DIAGNOSIS — J439 Emphysema, unspecified: Secondary | ICD-10-CM | POA: Diagnosis not present

## 2018-05-29 NOTE — Telephone Encounter (Signed)
Called and spoke with [place], [facility].  She stated that the Patient was just notified that his neb machine was ready for pick up.  She stated that the Patient was picking it up this afternoon at [facility] office.  Did leave a VM with Patient.  Nothing further at this time.

## 2018-06-02 ENCOUNTER — Other Ambulatory Visit: Payer: Self-pay

## 2018-06-02 DIAGNOSIS — K860 Alcohol-induced chronic pancreatitis: Secondary | ICD-10-CM

## 2018-06-02 NOTE — Telephone Encounter (Signed)
traMADol (ULTRAM) 50 MG tablet   Refill request @  [facility] [place] - [place], [place] - [address] [phone] (Phone) [phone] (Fax)    

## 2018-06-02 NOTE — Telephone Encounter (Signed)
Closing this encounter.  RX for tramadol "phoned in" per 05/28/18 order. Thanks! Coralee NorthStacee,RN

## 2018-06-02 NOTE — Telephone Encounter (Signed)
Per Med Rec, tramadol RX was sent on 05/28/18.  Called [facility] and pharmacist states they did not receive RX on this date, last RX received was 05/04/18 per [facility]. SChaplin, RN,BSN

## 2018-06-03 LAB — ALPHA-1 ANTITRYPSIN PHENOTYPE: A1 ANTITRYPSIN SER: 192 mg/dL (ref 83–199)

## 2018-06-09 ENCOUNTER — Ambulatory Visit: Payer: Medicaid Other | Admitting: Internal Medicine

## 2018-06-09 ENCOUNTER — Telehealth: Payer: Self-pay | Admitting: Pulmonary Disease

## 2018-06-09 ENCOUNTER — Encounter: Payer: Self-pay | Admitting: Pulmonary Disease

## 2018-06-09 VITALS — BP 122/98 | HR 105 | Temp 97.8°F | Wt 106.5 lb

## 2018-06-09 DIAGNOSIS — Z79891 Long term (current) use of opiate analgesic: Secondary | ICD-10-CM | POA: Diagnosis not present

## 2018-06-09 DIAGNOSIS — F17201 Nicotine dependence, unspecified, in remission: Secondary | ICD-10-CM | POA: Diagnosis not present

## 2018-06-09 DIAGNOSIS — R911 Solitary pulmonary nodule: Secondary | ICD-10-CM

## 2018-06-09 DIAGNOSIS — J439 Emphysema, unspecified: Secondary | ICD-10-CM | POA: Diagnosis not present

## 2018-06-09 DIAGNOSIS — E785 Hyperlipidemia, unspecified: Secondary | ICD-10-CM | POA: Diagnosis not present

## 2018-06-09 DIAGNOSIS — K861 Other chronic pancreatitis: Secondary | ICD-10-CM

## 2018-06-09 DIAGNOSIS — L409 Psoriasis, unspecified: Secondary | ICD-10-CM | POA: Diagnosis not present

## 2018-06-09 DIAGNOSIS — Z23 Encounter for immunization: Secondary | ICD-10-CM | POA: Diagnosis not present

## 2018-06-09 DIAGNOSIS — K860 Alcohol-induced chronic pancreatitis: Secondary | ICD-10-CM

## 2018-06-09 DIAGNOSIS — Z0000 Encounter for general adult medical examination without abnormal findings: Secondary | ICD-10-CM

## 2018-06-09 NOTE — Patient Instructions (Signed)
[name]  Thank you for visiting the clinic. Here are our recommendations:  Congratulations on quitting smoking! Make sure to continue staying away from your cigarettes. We have given you a flu shot. Please call us if you develop a fever.  We strongly advise that you follow up closely with your pulmonologist. Remember to use your nebulizer treatments as instructed.  It was pleasure to see you again.   Chronic Obstructive Pulmonary Disease Chronic obstructive pulmonary disease (COPD) is a long-term (chronic) lung problem. When you have COPD, it is hard for air to get in and out of your lungs. The way your lungs work will never return to normal. Usually the condition gets worse over time. There are things you can do to keep yourself as healthy as possible. Your doctor may treat your condition with:  Medicines.  Quitting smoking, if you smoke.  Rehabilitation. This may involve a team of specialists.  Oxygen.  Exercise and changes to your diet.  Lung surgery.  Comfort measures (palliative care).  Follow these instructions at home: Medicines  Take over-the-counter and prescription medicines only as told by your doctor.  Talk to your doctor before taking any cough or allergy medicines. You may need to avoid medicines that cause your lungs to be dry. Lifestyle  If you smoke, stop. Smoking makes the problem worse. If you need help quitting, ask your doctor.  Avoid being around things that make your breathing worse. This may include smoke, chemicals, and fumes.  Stay active, but remember to also rest.  Learn and use tips on how to relax.  Make sure you get enough sleep. Most adults need at least 7 hours a night.  Eat healthy foods. Eat smaller meals more often. Rest before meals. Controlled breathing  Learn and use tips on how to control your breathing as told by your doctor. Try: ? Breathing in (inhaling) through your nose for 1 second. Then, pucker your lips and breath out  (exhale) through your lips for 2 seconds. ? Putting one hand on your belly (abdomen). Breathe in slowly through your nose for 1 second. Your hand on your belly should move out. Pucker your lips and breathe out slowly through your lips. Your hand on your belly should move in as you breathe out. Controlled coughing  Learn and use controlled coughing to clear mucus from your lungs. The steps are: 1. Lean your head a little forward. 2. Breathe in deeply. 3. Try to hold your breath for 3 seconds. 4. Keep your mouth slightly open while coughing 2 times. 5. Spit any mucus out into a tissue. 6. Rest and do the steps again 1 or 2 times as needed. General instructions  Make sure you get all the shots (vaccines) that your doctor recommends. Ask your doctor about a flu shot and a pneumonia shot.  Use oxygen therapy and therapy to help improve your lungs (pulmonary rehabilitation) if told by your doctor. If you need home oxygen therapy, ask your doctor if you should buy a tool to measure your oxygen level (oximeter).  Make a COPD action plan with your doctor. This helps you know what to do if you feel worse than usual.  Manage any other conditions you have as told by your doctor.  Avoid going outside when it is very hot, cold, or humid.  Avoid people who have a sickness you can catch (contagious).  Keep all follow-up visits as told by your doctor. This is important. Contact a doctor if:  You cough  up more mucus than usual.  There is a change in the color or thickness of the mucus.  It is harder to breathe than usual.  Your breathing is faster than usual.  You have trouble sleeping.  You need to use your medicines more often than usual.  You have trouble doing your normal activities such as getting dressed or walking around the house. Get help right away if:  You have shortness of breath while resting.  You have shortness of breath that stops you from: ? Being able to talk. ? Doing  normal activities.  Your chest hurts for longer than 5 minutes.  Your skin color is more blue than usual.  Your pulse oximeter shows that you have low oxygen for longer than 5 minutes.  You have a fever.  You feel too tired to breathe normally. Summary  Chronic obstructive pulmonary disease (COPD) is a long-term lung problem.  The way your lungs work will never return to normal. Usually the condition gets worse over time. There are things you can do to keep yourself as healthy as possible.  Take over-the-counter and prescription medicines only as told by your doctor.  If you smoke, stop. Smoking makes the problem worse. This information is not intended to replace advice given to you by your health care provider. Make sure you discuss any questions you have with your health care provider. Document Released: 11/27/2007 Document Revised: 11/16/2015 Document Reviewed: 02/04/2013 Elsevier Interactive Patient Education  2017 [employer].

## 2018-06-09 NOTE — Progress Notes (Signed)
CC: COPD  HPI: [name] is a 57 y.o. M w/ PMH of COPD, chronic pancreatitis, HLD, and psoriasis presenting for management of his chronic conditions. We discussed the results of his visit with his pulmonologist, Dr.Sood, who recommended repeat imaging in 07/2018. He states during Thanksgiving season he had some delays in receiving his nebulizers but he managed to get approval with help of Dr.Sood and is now endorsing less dyspnea and cough. He states he still has significant dyspnea on exertion and is mostly homebound but he is feeling less tired and he can sleep at night. He has quit smoking completely since late 04/2018 and has not needed additional support to continue his abstinence.  Past Medical History:  Diagnosis Date  . Chronic pancreatitis (HCC)   . Depression   . Duodenitis    by EGD 1/07  . Emphysema    unclear diagnosis noted in previous EMR  . History of alcohol abuse   . Hyperlipidemia   . Psoriasis    extensive, responds to strong steroids, unable to start molecular therapies due to cost  . RUE weakness    2/2 cervical spondylosis  . Stomach ulcer    Review of Systems: Review of Systems  Constitutional: Positive for malaise/fatigue and weight loss.  HENT: Negative for congestion and sore throat.   Respiratory: Positive for cough, shortness of breath and wheezing. Negative for hemoptysis and sputum production.        Improving on neb treatment  Cardiovascular: Positive for palpitations. Negative for chest pain and orthopnea.  Gastrointestinal: Positive for abdominal pain. Negative for constipation, diarrhea, nausea and vomiting.  Skin: Positive for rash (psoriasis).  Neurological: Positive for weakness. Negative for dizziness and headaches.     Physical Exam: Vitals:   06/09/18 1551  BP: (!) 122/98  Pulse: (!) 105  Temp: 97.8 F (36.6 C)  TempSrc: Oral  SpO2: 99%  Weight: 106 lb 8 oz (48.3 kg)    Physical Exam  Constitutional: He is oriented to  person, place, and time.  Chronically ill-appearing  HENT:  Head: Normocephalic.  Mouth/Throat: Oropharynx is clear and moist.  Temporal wasting  Eyes: Pupils are equal, round, and reactive to light. Conjunctivae and EOM are normal. No scleral icterus.  Neck: Normal range of motion. Neck supple.  Cardiovascular: Regular rhythm, normal heart sounds and intact distal pulses.  No murmur heard. Tachycardic  Respiratory: Effort normal. He has no wheezes. He has no rales.  Distant breath sounds  GI: Soft. Bowel sounds are normal. There is abdominal tenderness (epigastric tenderness to deep palpation).  Musculoskeletal: Normal range of motion.  Lymphadenopathy:    He has no cervical adenopathy.  Neurological: He is alert and oriented to person, place, and time. He has normal reflexes. No cranial nerve deficit.  Skin: Skin is warm and dry.      Assessment & Plan:   Preventative health care Mr.Totman agrees to get his flu shot in clinic today  COPD with emphysema (HCC) Started on yupelri, performist, and pulmicort per Dr.Sood. Scheduled for overnight oxygen test soon. Most likely will require home oxygen. No longer smoking after finding of lung nodule and COPD exacerbation near Thanksgiving. States 'he thought he was going to die.' Currently satisfied with treatment course but hoping to improve his respiratory status so he can get his lung nodule biopsy done.  - C/w current regimen - Encouraged continuation of smoking cessation  Lung nodule Found on Low dose CT scan 05/15/18, Confirmed on CTA 05/22/18.  Pulmonologist, Dr.Sood, believes Mr.Morreale will not be able to tolerate aggressive intervention and recommend repeat imaging in 3 months. High risk for cancer considering smoking history but currently has quit tobacco use completely for 3 weeks.  - F/u with repeat CT in 07/2018   Chronic pancreatitis (HCC) On tramadol 100mg  BID for chronic pancreatitis. Continuing to have chronic abdominal  pain but well managed w/ current pain regimen. Exacerbated during acute COPD exacerbation recently due to frequent coughs but currently is managing well.  - C/w current regimen    Patient seen with Dr. Cleda Daub   -Judeth Cornfield, PGY1 Pager: [phone]

## 2018-06-10 ENCOUNTER — Encounter: Payer: Self-pay | Admitting: Internal Medicine

## 2018-06-10 DIAGNOSIS — Z0000 Encounter for general adult medical examination without abnormal findings: Secondary | ICD-10-CM | POA: Insufficient documentation

## 2018-06-10 NOTE — Assessment & Plan Note (Addendum)
Started on yupelri, performist, and pulmicort per Dr.Sood. Scheduled for overnight oxygen test soon. Most likely will require home oxygen. No longer smoking after finding of lung nodule and COPD exacerbation near Thanksgiving. States 'he thought he was going to die.' Currently satisfied with treatment course but hoping to improve his respiratory status so he can get his lung nodule biopsy done.  - C/w current regimen - Encouraged continuation of smoking cessation

## 2018-06-10 NOTE — Assessment & Plan Note (Signed)
[name] agrees to get his flu shot in clinic today

## 2018-06-10 NOTE — Assessment & Plan Note (Signed)
On tramadol 100mg  BID for chronic pancreatitis. Continuing to have chronic abdominal pain but well managed w/ current pain regimen. Exacerbated during acute COPD exacerbation recently due to frequent coughs but currently is managing well.  - C/w current regimen

## 2018-06-10 NOTE — Assessment & Plan Note (Signed)
Found on Low dose CT scan 05/15/18, Confirmed on CTA 05/22/18. Pulmonologist, Dr.Sood, believes [name] will not be able to tolerate aggressive intervention and recommend repeat imaging in 3 months. High risk for cancer considering smoking history but currently has quit tobacco use completely for 3 weeks.  - F/u with repeat CT in 07/2018

## 2018-06-10 NOTE — Telephone Encounter (Signed)
A1AT 05/27/18 >> 192, MM   Please let him know his blood test was normal.  He doesn't have inherited form of emphysema.

## 2018-06-11 NOTE — Telephone Encounter (Signed)
Called and spoke with patient regarding results.  Informed the patient of results and recommendations today. Pt verbalized understanding and denied any questions or concerns at this time.  Nothing further needed.  

## 2018-06-12 NOTE — Progress Notes (Signed)
Internal Medicine Clinic Attending  I saw and evaluated the patient.  I personally confirmed the key portions of the history and exam documented by Dr. Lee and I reviewed pertinent patient test results.  The assessment, diagnosis, and plan were formulated together and I agree with the documentation in the resident's note.  

## 2018-06-29 ENCOUNTER — Encounter: Payer: Self-pay | Admitting: Pulmonary Disease

## 2018-06-29 ENCOUNTER — Ambulatory Visit: Payer: Medicaid Other | Admitting: Pulmonary Disease

## 2018-06-29 ENCOUNTER — Other Ambulatory Visit: Payer: Self-pay | Admitting: Internal Medicine

## 2018-06-29 VITALS — BP 100/56 | HR 112 | Ht 68.0 in | Wt 112.2 lb

## 2018-06-29 DIAGNOSIS — R911 Solitary pulmonary nodule: Secondary | ICD-10-CM | POA: Diagnosis not present

## 2018-06-29 DIAGNOSIS — J432 Centrilobular emphysema: Secondary | ICD-10-CM | POA: Diagnosis not present

## 2018-06-29 DIAGNOSIS — K860 Alcohol-induced chronic pancreatitis: Secondary | ICD-10-CM

## 2018-06-29 MED ORDER — TRAMADOL HCL 50 MG PO TABS: 100.0000 mg | ORAL_TABLET | Freq: Two times a day (BID) | ORAL | 0 refills | Status: DC

## 2018-06-29 MED ORDER — ALBUTEROL SULFATE HFA 108 (90 BASE) MCG/ACT IN AERS: 2.0000 | INHALATION_SPRAY | Freq: Four times a day (QID) | RESPIRATORY_TRACT | 5 refills | Status: DC | PRN

## 2018-06-29 NOTE — Progress Notes (Signed)
[facility]  Chief Complaint  Patient presents with  . Follow-up    pt states breathing is baseline since last OV-cough has subsided. c/o sob with exertion    Constitutional:  BP (!) 100/56 (BP Location: Right Arm, Cuff Size: Normal)   Pulse (!) 112   Ht 5\' 8"  (1.727 m)   Wt 112 lb 3.2 oz (50.9 kg)   SpO2 98%   BMI 17.06 kg/m   Past Medical History:  Chronic pancreatitis, Depression, ETOH, HLD, Psoriasis, PUD, Rt PTX  Brief Summary:  [name] is a 58 y.o. male former smoker with COPD and bullous emphysema.  He has not smoked since he was in hospital.  Nebulizer medicine working better.  Needs rescue inhaler to carry with him.    Gets winded if he walks more that 200 feet.  Not having chest pain, fever, hemoptysis, or leg swelling.   Physical Exam:   Appearance - thin  ENMT - no sinus tenderness, no nasal discharge, no oral exudate  Neck - no masses, trachea midline, no thyromegaly, no elevation in JVP  Respiratory - normal appearance of chest wall, normal respiratory effort w/o accessory muscle use, no dullness on percussion, no tactile fremitus, no wheezing or rales  CV - s1s2 regular rate and rhythm, no murmurs, no peripheral edema, no varicosities, radial pulses symmetric  GI - soft, non tender, no masses, no hepatosplenomegaly  Lymph - no adenopathy noted in neck and axillary areas  MSK - normal gait  Ext - no cyanosis, clubbing, or joint inflammation noted  Skin - no rashes, lesions, or ulcers  Neuro - normal strength, oriented x 3  Psych - normal mood and affect  Assessment/Plan:   COPD with emphysema - continue yupelri, performist, pulmicort - prn albuterol  Lung nodule. - will schedule CT chest w/o contrast for February 2020  Psoriasis. - he is on MTX with dermatology from [facility]   Patient Instructions  Will schedule CT chest without contrast for February 2020  Follow up in 6  weeks  Time spent 26 minutes  Coralyn Helling, MD [facility] Pager: [phone] 06/29/2018, 11:53 AM  Flow Sheet     Pulmonary tests:  PFT 07/14/17 >> FEV1 1.69 (48%), FEV1% 41, TLC 7.09 (107%), RV 3.41 (168%), DLCO 32% CT angio chest 05/22/18 >> severe emphysema, moderate bullous area Rt upper lung, 2 mm nodule RLL, 7 mm nodule in lingula, 21 mm nodule LLL A1AT 05/27/18 >> 192, MM  Cardiac tests:  Echo 10/11/16 >> EF 69%, grade 1 DD  Medications:   Allergies as of 06/29/2018      Reactions   Nsaids Other (See Comments)   Bleeding gastritis      Medication List       Accurate as of June 29, 2018 11:53 AM. Always use your most recent med list.        albuterol (2.5 MG/3ML) 0.083% nebulizer solution Commonly known as:  PROVENTIL Take 3 mLs (2.5 mg total) by nebulization every 6 (six) hours as needed for wheezing or shortness of breath.   albuterol 108 (90 Base) MCG/ACT inhaler Commonly known as:  PROAIR HFA Inhale 2 puffs into the lungs every 6 (six) hours as needed for wheezing or shortness of breath.   amitriptyline 10 MG tablet Commonly known as:  ELAVIL Take 2 tablets (20 mg total) by mouth at bedtime. Can increase to two tablets after 2 weeks   atorvastatin 40 MG tablet Commonly  known as:  LIPITOR Take 1 tablet (40 mg total) by mouth daily.   budesonide 0.5 MG/2ML nebulizer solution Commonly known as:  PULMICORT Take 2 mLs (0.5 mg total) by nebulization 2 (two) times daily.   folic acid 1 MG tablet Commonly known as:  FOLVITE Take 1 tablet (1 mg total) by mouth daily.   formoterol 20 MCG/2ML nebulizer solution Commonly known as:  PERFOROMIST Take 2 mLs (20 mcg total) by nebulization 2 (two) times daily.   methotrexate 2.5 MG tablet Commonly known as:  RHEUMATREX 7.5 mg once a week.   pantoprazole 40 MG tablet Commonly known as:  PROTONIX Take 1 tablet (40 mg total) by mouth daily.   Revefenacin 175 MCG/3ML Soln Commonly known  as:  YUPELRI Inhale 1 vial into the lungs daily.   traMADol 50 MG tablet Commonly known as:  ULTRAM Take 2 tablets (100 mg total) by mouth 2 (two) times daily.   triamcinolone cream 0.1 % Commonly known as:  KENALOG Apply 1 application topically 2 (two) times daily.       Past Surgical History:  He  has a past surgical history that includes Laparoscopic cholecystectomy single site with intraoperative cholangiogram (N/A, 11/28/2016); Liver biopsy (N/A, 11/28/2016); Laparoscopic cholecystectomy (11/28/2016); Tympanostomy tube placement (Bilateral, ~ 1970); and Esophagogastroduodenoscopy (egd) with propofol (N/A, 12/04/2016).  Family History:  His family history includes Cancer in his father; Diabetes in his mother; Heart disease in his father.  Social History:  He  reports that he quit smoking about 5 weeks ago. His smoking use included cigarettes. He has a 12.00 pack-year smoking history. He has never used smokeless tobacco. He reports that he does not drink alcohol or use drugs.

## 2018-06-29 NOTE — Patient Instructions (Addendum)
Will schedule CT chest without contrast for February 2020  Follow up in 6 weeks

## 2018-06-29 NOTE — Telephone Encounter (Signed)
Needs refill on traMADol (ULTRAM) 50 MG tablet at [facility] at [facility], [place] - [address] pt contact [phone]

## 2018-07-02 ENCOUNTER — Other Ambulatory Visit: Payer: Self-pay | Admitting: Internal Medicine

## 2018-07-02 ENCOUNTER — Other Ambulatory Visit: Payer: Self-pay

## 2018-07-02 DIAGNOSIS — K860 Alcohol-induced chronic pancreatitis: Secondary | ICD-10-CM

## 2018-07-02 NOTE — Telephone Encounter (Signed)
Called to [employer], spoke w/ susan

## 2018-07-02 NOTE — Telephone Encounter (Signed)
traMADol (ULTRAM) 50 MG tablet   Refill request @  [facility] at [place] - [place], [place] - [address] [phone] (Phone) [phone] (Fax)    

## 2018-07-03 ENCOUNTER — Telehealth: Payer: Self-pay | Admitting: Pulmonary Disease

## 2018-07-03 NOTE — Telephone Encounter (Signed)
Attempted to call patient today regarding results. I did not receive an answer at time of call. I have left a voicemail message for pt to return call. X1  

## 2018-07-03 NOTE — Telephone Encounter (Signed)
ONO with RA 06/09/18 >> test time 8 hrs 59 min.  Baseline SpO2 95%, low SpO2 90%.    Please let him know his oxygen test was okay.  He doesn't need supplemental oxygen at night.

## 2018-07-06 NOTE — Telephone Encounter (Signed)
Called and spoke with patient regarding results.  Informed the patient of results and recommendations today. Pt verbalized understanding and denied any questions or concerns at this time.  Nothing further needed.  

## 2018-07-09 DIAGNOSIS — L409 Psoriasis, unspecified: Secondary | ICD-10-CM | POA: Diagnosis not present

## 2018-07-13 DIAGNOSIS — L400 Psoriasis vulgaris: Secondary | ICD-10-CM | POA: Diagnosis not present

## 2018-07-30 ENCOUNTER — Inpatient Hospital Stay: Admission: RE | Admit: 2018-07-30 | Payer: Medicaid Other | Source: Ambulatory Visit

## 2018-07-30 ENCOUNTER — Telehealth: Payer: Self-pay

## 2018-07-30 DIAGNOSIS — K860 Alcohol-induced chronic pancreatitis: Secondary | ICD-10-CM

## 2018-07-30 NOTE — Telephone Encounter (Signed)
traMADol (ULTRAM) 50 MG tablet, refill request @  [facility] at [facility], [place] - [address] [phone] (Phone) [phone] (Fax)

## 2018-07-31 ENCOUNTER — Other Ambulatory Visit: Payer: Self-pay | Admitting: Internal Medicine

## 2018-07-31 DIAGNOSIS — K860 Alcohol-induced chronic pancreatitis: Secondary | ICD-10-CM

## 2018-07-31 MED ORDER — TRAMADOL HCL 50 MG PO TABS: 100.0000 mg | ORAL_TABLET | Freq: Two times a day (BID) | ORAL | 0 refills | Status: DC

## 2018-08-03 ENCOUNTER — Other Ambulatory Visit: Payer: Self-pay | Admitting: Internal Medicine

## 2018-08-03 NOTE — Telephone Encounter (Signed)
Needs refill on traMADol (ULTRAM) 50 MG tablet [facility] at [place] - [place], [place] - [address]  pt contact; [phone] °

## 2018-08-03 NOTE — Telephone Encounter (Signed)
Patient called in stating problem with Tramadol Rx. Call placed to Pharmacy. Spoke with Harriett Sine. States PA ran out in February. Requesting new PA for Tramadol. States she faxed request. Kinnie Feil, RN, BSN

## 2018-08-03 NOTE — Telephone Encounter (Signed)
Rx was approved on 07/31/18 for "phone it". Rx phoned into the pharmacy-pt aware.Criss Alvine, Lorrena Goranson Cassady2/10/202011:16 AM

## 2018-08-04 ENCOUNTER — Telehealth: Payer: Self-pay | Admitting: *Deleted

## 2018-08-04 ENCOUNTER — Telehealth: Payer: Self-pay | Admitting: Internal Medicine

## 2018-08-04 ENCOUNTER — Encounter (HOSPITAL_COMMUNITY): Payer: Self-pay | Admitting: *Deleted

## 2018-08-04 NOTE — Progress Notes (Signed)
I contacted [place] today to discuss the Pulmonary Maintenance Program. I explained to him that his insurance Medicaid would not cover the cost of the Pulmonary  Undergraduate Program. He voices understanding. I explained the program to him. He is interested but he is considered about the cost of the program. He understands that this is a self pay program. [place] states that he wants to discuss this with Dr. Craige Cotta and think about it. He said that he would call me back. I gave him the number to return the call. I encouraged him to call if he has any questions about the program that I can answer for him.

## 2018-08-04 NOTE — Telephone Encounter (Signed)
Spoke w/ [facility] and informed them that the PA has been submitted to [facility], they have already spoken to pt and explained that.

## 2018-08-04 NOTE — Telephone Encounter (Signed)
PA information was faxed to [employer].

## 2018-08-04 NOTE — Telephone Encounter (Signed)
Pt calling back checking on his medicine; pt contact [phone]

## 2018-08-04 NOTE — Telephone Encounter (Addendum)
Information was faxed to [facility] [employer] for PA for Tramadol.  Awaiting decision from [employer].  Angelina Ok, RN 08/04/2018 1:40 PM. PA for Tramadol 50 mg tablets was approved 08/04/2018 thru 01/31/2019.  PA [id number].  [facility] was called and informed of.  Angelina Ok, RN 08/05/2018 9:00 AM

## 2018-08-04 NOTE — Telephone Encounter (Signed)
Spoke to pt and explained again

## 2018-08-05 ENCOUNTER — Ambulatory Visit: Payer: Medicaid Other | Admitting: Pulmonary Disease

## 2018-08-21 ENCOUNTER — Inpatient Hospital Stay: Admission: RE | Admit: 2018-08-21 | Payer: Medicaid Other | Source: Ambulatory Visit

## 2018-08-24 ENCOUNTER — Other Ambulatory Visit: Payer: Self-pay | Admitting: Pulmonary Disease

## 2018-08-26 ENCOUNTER — Telehealth: Payer: Self-pay | Admitting: Pulmonary Disease

## 2018-08-26 MED ORDER — PULMICORT 0.5 MG/2ML IN SUSP: RESPIRATORY_TRACT | 5 refills | Status: DC

## 2018-08-26 NOTE — Telephone Encounter (Signed)
Called [facility], [place].  Aneta Mins stated that Pulmicort 58ml prescription was sent to pharmacy and that was only 15 days worth.  Aneta Mins requested Pulmicort  prescription. New Pulmicort , with refills sent to [facility]. Nothing further at this time.

## 2018-09-01 ENCOUNTER — Telehealth: Payer: Self-pay | Admitting: Pulmonary Disease

## 2018-09-01 NOTE — Telephone Encounter (Signed)
Spoke to pt he is aware I did get the ct scheduled and authorized for 09/09/18@9am  @[facility]  and then transferred him to front desk to reschedule his appt with dr Craige Cotta Tobe Sos

## 2018-09-02 ENCOUNTER — Ambulatory Visit: Payer: Medicaid Other | Admitting: Pulmonary Disease

## 2018-09-07 ENCOUNTER — Other Ambulatory Visit: Payer: Self-pay | Admitting: Internal Medicine

## 2018-09-07 DIAGNOSIS — K860 Alcohol-induced chronic pancreatitis: Secondary | ICD-10-CM

## 2018-09-07 MED ORDER — TRAMADOL HCL 50 MG PO TABS: 100.0000 mg | ORAL_TABLET | Freq: Two times a day (BID) | ORAL | 0 refills | Status: DC

## 2018-09-07 NOTE — Telephone Encounter (Signed)
Needs to refill on traMADol (ULTRAM) 50 MG tablet [facility] at [facility], [place] - [address] ;pt contact [phone]

## 2018-09-08 ENCOUNTER — Encounter: Payer: Medicaid Other | Admitting: Internal Medicine

## 2018-09-09 ENCOUNTER — Other Ambulatory Visit: Payer: Self-pay

## 2018-09-09 ENCOUNTER — Encounter (INDEPENDENT_AMBULATORY_CARE_PROVIDER_SITE_OTHER): Payer: Self-pay

## 2018-09-09 ENCOUNTER — Ambulatory Visit (INDEPENDENT_AMBULATORY_CARE_PROVIDER_SITE_OTHER)
Admission: RE | Admit: 2018-09-09 | Discharge: 2018-09-09 | Disposition: A | Discharge status: Home / Self Care | Payer: Medicaid Other | Source: Ambulatory Visit | Attending: Pulmonary Disease | Admitting: Pulmonary Disease

## 2018-09-09 ENCOUNTER — Other Ambulatory Visit: Payer: Self-pay | Admitting: Internal Medicine

## 2018-09-09 DIAGNOSIS — R911 Solitary pulmonary nodule: Secondary | ICD-10-CM

## 2018-09-09 DIAGNOSIS — K860 Alcohol-induced chronic pancreatitis: Secondary | ICD-10-CM

## 2018-09-09 NOTE — Telephone Encounter (Signed)
Pt states he is out of Tramadol, requesting the med to be filled by today. Please call pt back.

## 2018-09-09 NOTE — Telephone Encounter (Signed)
Needs refill on traMADol (ULTRAM) 50 MG tablet [facility] at [place] - [place], [place] - [address]  pt contact; [phone] °

## 2018-09-10 NOTE — Telephone Encounter (Signed)
pcp authorized refill on 09/07/18, but rx failed to transmit...tramadol rx phoned into pharmacy.  Pt aware.[name], [name]3/19/202010:32 AM

## 2018-09-14 ENCOUNTER — Ambulatory Visit: Payer: Medicaid Other | Admitting: Pulmonary Disease

## 2018-09-28 ENCOUNTER — Telehealth: Payer: Self-pay | Admitting: Pulmonary Disease

## 2018-09-28 ENCOUNTER — Ambulatory Visit: Payer: Medicaid Other | Admitting: Pulmonary Disease

## 2018-09-28 ENCOUNTER — Ambulatory Visit (INDEPENDENT_AMBULATORY_CARE_PROVIDER_SITE_OTHER): Payer: Medicaid Other | Admitting: Adult Health

## 2018-09-28 ENCOUNTER — Encounter: Payer: Self-pay | Admitting: Adult Health

## 2018-09-28 ENCOUNTER — Other Ambulatory Visit: Payer: Self-pay

## 2018-09-28 DIAGNOSIS — J449 Chronic obstructive pulmonary disease, unspecified: Secondary | ICD-10-CM | POA: Diagnosis not present

## 2018-09-28 DIAGNOSIS — R911 Solitary pulmonary nodule: Secondary | ICD-10-CM

## 2018-09-28 DIAGNOSIS — R918 Other nonspecific abnormal finding of lung field: Secondary | ICD-10-CM

## 2018-09-28 NOTE — Telephone Encounter (Signed)
Done see televisit

## 2018-09-28 NOTE — Progress Notes (Signed)
Virtual Visit via Telephone Note  I connected with [name] on 09/28/18 at  2:30 PM EDT by telephone and verified that I am speaking with the correct person using two identifiers.   I discussed the limitations, risks, security and privacy concerns of performing an evaluation and management service by telephone and the availability of in person appointments. I also discussed with the patient that there may be a patient responsible charge related to this service. The patient expressed understanding and agreed to proceed.   History of Present Illness: Today's tele-visit is for a 56-month follow-up of COPD and lung nodule 58 year old male former smoker followed for COPD and bullous emphysema Medical history significant for psoriasis on methotrexate  Patient says overall breathing is doing about the same.  He had no flare of cough or wheezing.  He says he does get short of breath with heavy activity.   Has had no emergency room visits or hospitalizations since last visit.  He remains on new pulmonary, Perforomist and budesonide nebulizers.  He denies any increased albuterol use.  He does have an occasional cough. We discussed pulmonary rehab however this is not covered by his insurance and he is unable to afford the $75 pulmonary rehab visit.    Patient did have a left lower lobe nodule measuring 21 mm along with a right lower lobe and lingula nodule.  A repeat CT chest was done on September 09, 2018 that showed left lower lobe nodule has resolved.  New left upper lobe pulmonary nodule measuring 6 mm.  New bandlike consolidation of the right upper lobe compatible with a postinfectious/postinflammatory scarring. We discussed these test results.  Plan for a follow-up CT chest in 6 months.  We discussed coronavirus precautions.   Observations/Objective: PFT 07/14/17 >> FEV1 1.69 (48%), FEV1% 41, TLC 7.09 (107%), RV 3.41 (168%), DLCO 32% CT angio chest 05/22/18 >> severe emphysema, moderate bullous  area Rt upper lung, 2 mm nodule RLL, 7 mm nodule in lingula, 21 mm nodule LLL A1AT 05/27/18 >> 192, MM  Cardiac tests:  Echo 10/11/16 >> EF 69%, grade 1 DD   Assessment and Plan: Severe COPD and bullous emphysema appears stable on current regimen  Lung nodules-CT chest done March 2020 showed resolution of left lower lobe lung nodule.  He does have a left upper lobe nodule and right upper lobe bandlike consolidation that will need to be followed.  We will plan for a follow-up CT and 6 months  Plan  Patient Instructions  Will schedule CT chest without contrast for September 2020 to follow lung nodule.  Continue on current regimen  Follow up with Dr. Craige Cotta  In 6 months and As needed         Follow Up Instructions: Follow-up with Dr.Sood in 6 months and sooner as needed   I discussed the assessment and treatment plan with the patient. The patient was provided an opportunity to ask questions and all were answered. The patient agreed with the plan and demonstrated an understanding of the instructions.   The patient was advised to call back or seek an in-person evaluation if the symptoms worsen or if the condition fails to improve as anticipated.  I provided 23  minutes of non-face-to-face time during this encounter.   Rubye Oaks, NP

## 2018-09-28 NOTE — Telephone Encounter (Signed)
CT chest 09/09/18 >> atherosclerosis, moderate/severe centrilobular emphysema, diffuse bronchial thickening, large bulla RUL, previous 2.4 cm LLL nodule resolved, new 6 mm LUL nodule, scarring RUL, scattered nodules RUL up to 4 mm stable   Please let him know CT chest shows resolution of LLL nodule.  He has new small nodule in LUL.  Needs f/u CT chest w/o contrast in 6 months.

## 2018-09-28 NOTE — Patient Instructions (Signed)
Will schedule CT chest without contrast for September 2020 to follow lung nodule.  Continue on current regimen  Follow up with Dr. Craige Cotta  In 6 months and As needed

## 2018-09-28 NOTE — Addendum Note (Signed)
Addended by: Maurene Capes on: 09/28/2018 03:22 PM   Modules accepted: Orders

## 2018-10-07 ENCOUNTER — Other Ambulatory Visit: Payer: Self-pay

## 2018-10-07 DIAGNOSIS — K860 Alcohol-induced chronic pancreatitis: Secondary | ICD-10-CM

## 2018-10-07 MED ORDER — TRAMADOL HCL 50 MG PO TABS: 100.0000 mg | ORAL_TABLET | Freq: Two times a day (BID) | ORAL | 0 refills | Status: DC

## 2018-10-07 MED ORDER — AMITRIPTYLINE HCL 10 MG PO TABS: 20.0000 mg | ORAL_TABLET | Freq: Every day | ORAL | 3 refills | Status: DC

## 2018-10-07 NOTE — Telephone Encounter (Signed)
Next appt scheduled 5/5 with PCP. 

## 2018-10-07 NOTE — Addendum Note (Signed)
Addended by: Debe Coder B on: 10/07/2018 04:24 PM   Modules accepted: Orders

## 2018-10-07 NOTE — Telephone Encounter (Signed)
amitriptyline (ELAVIL) 10 MG tablet   traMADol (ULTRAM) 50 MG tablet   Refill request @  [facility] - [place], [place] - [address] [place] [phone] (Phone) [phone] (Fax)

## 2018-10-08 ENCOUNTER — Telehealth: Payer: Self-pay | Admitting: *Deleted

## 2018-10-08 DIAGNOSIS — L400 Psoriasis vulgaris: Secondary | ICD-10-CM | POA: Diagnosis not present

## 2018-10-08 MED FILL — AMITRIPTYLINE HCL 10 MG TAB: 10 | 30 days supply | Qty: 60 | Fill #0

## 2018-10-08 MED FILL — traMADol HCL 50 MG TABS: 50 | 30 days supply | Qty: 120 | Fill #0

## 2018-10-08 NOTE — Telephone Encounter (Signed)
Phillip from pharmacy called states tramadol script went [facility]  instead of [facility], he cancelled script [facility] gave script verbally to phillip to fill at [facility]

## 2018-10-12 DIAGNOSIS — L400 Psoriasis vulgaris: Secondary | ICD-10-CM | POA: Diagnosis not present

## 2018-10-12 MED FILL — hydrOXYzine HCL 25 MG TABS: 25 | 33 days supply | Qty: 100 | Fill #0

## 2018-10-12 MED FILL — METHOTREXATE SODIUM 2.5 MG: 2.5 | 28 days supply | Qty: 12 | Fill #0

## 2018-10-21 MED FILL — PERFOROMIST 20 MCG/2 ML SOL: 20 | 30 days supply | Qty: 120 | Fill #0

## 2018-10-21 MED FILL — PULMICORT 0.5 MG/2 ML RESPU: 0.5 | 30 days supply | Qty: 120 | Fill #0

## 2018-10-21 MED FILL — YUPELRI 175 MCG/3ML SOLN: 175 | 30 days supply | Qty: 90 | Fill #0

## 2018-10-22 DIAGNOSIS — J439 Emphysema, unspecified: Secondary | ICD-10-CM | POA: Diagnosis not present

## 2018-10-27 ENCOUNTER — Encounter: Payer: Self-pay | Admitting: Internal Medicine

## 2018-10-27 ENCOUNTER — Other Ambulatory Visit: Payer: Self-pay

## 2018-10-27 ENCOUNTER — Ambulatory Visit (INDEPENDENT_AMBULATORY_CARE_PROVIDER_SITE_OTHER): Payer: Medicaid Other | Admitting: Internal Medicine

## 2018-10-27 DIAGNOSIS — R911 Solitary pulmonary nodule: Secondary | ICD-10-CM | POA: Diagnosis not present

## 2018-10-27 DIAGNOSIS — F10929 Alcohol use, unspecified with intoxication, unspecified: Secondary | ICD-10-CM | POA: Diagnosis not present

## 2018-10-27 DIAGNOSIS — K860 Alcohol-induced chronic pancreatitis: Secondary | ICD-10-CM

## 2018-10-27 DIAGNOSIS — J439 Emphysema, unspecified: Secondary | ICD-10-CM | POA: Diagnosis not present

## 2018-10-27 MED ORDER — TRAMADOL HCL 50 MG PO TABS: 100.0000 mg | ORAL_TABLET | Freq: Two times a day (BID) | ORAL | 0 refills | Status: DC | PRN

## 2018-10-27 NOTE — Assessment & Plan Note (Signed)
On tramadol 100mg  BID for chronic pancreatitis. Continuing to endorse pain but able to function and not in distress on current regimen. Denies any fevers, chills, nausea, vomiting, diarrhea. Still very thin but endorsing appetite and eating without difficulty.  [name] is on chronic opioid therapy for chronic pain from chronic pancreatitis. As part of the treatment plan, the [place] controlled substance database is checked at least twice yearly and the database results was checked 10/27/2018 and is appropriate.   The last UDS was on 10/2018 and results were as expected. Patient needs a yearly UDS.  Pain contract was updated and signed on 01/11/18. Through the pain contract, [name] describes commitment to avoid excessive opioid use and only relying on pain medications to pursue goal of ability to maintain proper nutrition  The patient is on tramadol 100mg  BID, 120 per 30 days. Adjunctive treatment includes alcohol abstinence. This regimen allows [name] to function without excessive sedation or side effects. The benefits of continuing opioid therapy outweigh the risks and chronic opioids will be continued. Ongoing education about safe opioid treatment is provided.    - C/w current regimen: tramadol 100mg  BID - F/u in 3 months

## 2018-10-27 NOTE — Assessment & Plan Note (Signed)
Continuing to use yupelri, peformist and pulmciort. Requires nebulizer about 5 times daily which has not changed significantly since beginning of the year. States he no longer is using oxygen at home. Continuing to endorse significant dyspnea on exertion. He states he can walk about a room's length before requiring rest. He carries rescue inhaler on his person at all time. Practicing good social distancing with support from his mother who he lives with. Has been doing well with smoking cessation.  - C/w current regimen

## 2018-10-27 NOTE — Progress Notes (Signed)
  [facility] Residency Telephone Encounter Continuity Care Appointment  HPI:   This telephone encounter was created for [name] on 10/27/2018 for the following purpose/cc: Chronic Pancreatitis. Mr.Brabant states he is doing well with no significant complaints. He states he is continuing to endorse his chronic abdominal pain w/o any significant exacerbations. Able to eat without difficulty. Denies any nausea, vomiting, hematemesis. He is having difficulty walking with feeling short of breath after walking a room's length but states this has not changed since last winter. Continuing to use his nebulizer about 5 times daily. He mentions he carries his rescue inhaler on his person at all times.   Past Medical History:  Past Medical History:  Diagnosis Date  . Chronic pancreatitis (HCC)   . Depression   . Duodenitis    by EGD 1/07  . Emphysema    unclear diagnosis noted in previous EMR  . History of alcohol abuse   . Hyperlipidemia   . Psoriasis    extensive, responds to strong steroids, unable to start molecular therapies due to cost  . RUE weakness    2/2 cervical spondylosis  . Stomach ulcer       ROS:   Denies any fevers, chills, chest pain, palpitations, headache, nausea, vomiting, diarrhea, constipation. Endorse chronic fatigue, weakness and dyspnea.   Assessment / Plan / Recommendations:   Please see A&P under problem oriented charting for assessment of the patient's acute and chronic medical conditions.   As always, pt is advised that if symptoms worsen or new symptoms arise, they should go to an urgent care facility or to to ER for further evaluation.   Consent and Medical Decision Making:   Patient discussed with Dr. Cleda Daub  This is a telephone encounter between Dreama Saa and Theotis Barrio on 10/27/2018 for chronic pancreatitis. The visit was conducted with the patient located at home and Theotis Barrio at [facility]. The patient's identity was  confirmed using their DOB and current address. The patient has consented to being evaluated through a telephone encounter and understands the associated risks (an examination cannot be done and the patient may need to come in for an appointment) / benefits (allows the patient to remain at home, decreasing exposure to coronavirus). I personally spent 15 minutes on medical discussion.

## 2018-10-27 NOTE — Assessment & Plan Note (Addendum)
On f/u CT scan, 2cm LLL nodule has resolved. New 48mm LUL nodule was found. He had f/u with pulm who recommend repeat CT in 6 months. Agree with plan. Discussed the plan w/ [name] who expressed understanding.  - F/u CT chest 02/2019

## 2018-11-04 MED FILL — METHOTREXATE SODIUM 2.5 MG: 2.5 | 28 days supply | Qty: 12 | Fill #1

## 2018-11-04 MED FILL — AMITRIPTYLINE HCL 10 MG TAB: 10 | 30 days supply | Qty: 60 | Fill #1

## 2018-11-07 MED FILL — hydrOXYzine HCL 25 MG TABS: 25 | 33 days supply | Qty: 100 | Fill #1

## 2018-11-07 MED FILL — traMADol HCL 50 MG TABS: 50 | 30 days supply | Qty: 120 | Fill #0

## 2018-11-14 ENCOUNTER — Other Ambulatory Visit: Payer: Self-pay | Admitting: Pulmonary Disease

## 2018-11-14 MED FILL — PULMICORT 0.5 MG/2 ML RESPU: 0.5 | 30 days supply | Qty: 120 | Fill #1

## 2018-11-17 MED FILL — PROAIR HFA 90 MCG INHALER: 108 (90 BAS | 25 days supply | Qty: 9 | Fill #0

## 2018-11-17 MED FILL — YUPELRI 175 MCG/3ML SOLN: 175 | 30 days supply | Qty: 90 | Fill #0

## 2018-11-18 ENCOUNTER — Other Ambulatory Visit: Payer: Self-pay | Admitting: Pulmonary Disease

## 2018-11-23 ENCOUNTER — Telehealth: Payer: Self-pay | Admitting: Pulmonary Disease

## 2018-11-23 MED FILL — PERFOROMIST 20 MCG/2 ML SOL: 20 | 30 days supply | Qty: 120 | Fill #0

## 2018-11-23 NOTE — Telephone Encounter (Signed)
Called  Tracks started PA for Perforomist. Approved valid thru 11/18/19 Auth # [id number] Call ID # [id number] Called [facility] pharmacy spoke with Arlys John to notify. Called Alice back to notify PA approved there was no answer. [facility] pharmacy has to order medication but when they get it in they will call them. Nothing further needed.

## 2018-12-01 MED FILL — AMITRIPTYLINE HCL 10 MG TAB: 10 | 30 days supply | Qty: 60 | Fill #2

## 2018-12-02 ENCOUNTER — Other Ambulatory Visit: Payer: Self-pay | Admitting: Internal Medicine

## 2018-12-02 DIAGNOSIS — K860 Alcohol-induced chronic pancreatitis: Secondary | ICD-10-CM

## 2018-12-05 ENCOUNTER — Other Ambulatory Visit: Payer: Self-pay | Admitting: Internal Medicine

## 2018-12-05 DIAGNOSIS — K860 Alcohol-induced chronic pancreatitis: Secondary | ICD-10-CM

## 2018-12-05 MED FILL — traMADol HCL 50 MG TABS: 50 | 30 days supply | Qty: 120 | Fill #0

## 2018-12-07 MED FILL — hydrOXYzine HCL 25 MG TABS: 25 | 33 days supply | Qty: 100 | Fill #2

## 2018-12-07 MED FILL — METHOTREXATE SODIUM 2.5 MG: 2.5 | 28 days supply | Qty: 12 | Fill #2

## 2018-12-31 ENCOUNTER — Other Ambulatory Visit: Payer: Self-pay

## 2018-12-31 DIAGNOSIS — K860 Alcohol-induced chronic pancreatitis: Secondary | ICD-10-CM

## 2018-12-31 NOTE — Telephone Encounter (Signed)
traMADol (ULTRAM) 50 MG tablet   REFILL REQUEST @  [facility] at [facility], [place] [address] [phone] (Phone) [phone] (Fax)

## 2019-01-04 MED ORDER — TRAMADOL HCL 50 MG PO TABS: 100.0000 mg | ORAL_TABLET | Freq: Two times a day (BID) | ORAL | 0 refills | Status: DC

## 2019-01-08 DIAGNOSIS — L400 Psoriasis vulgaris: Secondary | ICD-10-CM | POA: Diagnosis not present

## 2019-01-09 MED FILL — hydrOXYzine HCL 25 MG TABS: 25 | 33 days supply | Qty: 100 | Fill #0

## 2019-01-11 DIAGNOSIS — L400 Psoriasis vulgaris: Secondary | ICD-10-CM | POA: Diagnosis not present

## 2019-01-18 ENCOUNTER — Other Ambulatory Visit: Payer: Self-pay | Admitting: Pulmonary Disease

## 2019-01-19 ENCOUNTER — Ambulatory Visit: Payer: Medicaid Other | Admitting: Pulmonary Disease

## 2019-02-01 ENCOUNTER — Telehealth: Payer: Self-pay | Admitting: Internal Medicine

## 2019-02-01 DIAGNOSIS — K860 Alcohol-induced chronic pancreatitis: Secondary | ICD-10-CM

## 2019-02-01 NOTE — Telephone Encounter (Signed)
Needs refill on traMADol (ULTRAM) 50 MG tablet [facility] at [facility], [place] [address]  pt contact; [phone]

## 2019-02-02 MED ORDER — TRAMADOL HCL 50 MG PO TABS: 100.0000 mg | ORAL_TABLET | Freq: Two times a day (BID) | ORAL | 0 refills | Status: DC

## 2019-02-04 ENCOUNTER — Telehealth: Payer: Self-pay | Admitting: *Deleted

## 2019-02-04 ENCOUNTER — Telehealth: Payer: Self-pay

## 2019-02-04 NOTE — Telephone Encounter (Addendum)
Information was sent to [employer] for PA for Tramadol.  Awaiting deternination.  Sander Nephew, RN 02/04/2019 2:30 PM.    Call to [employer] PA for Tramadol approved 02/04/2019 thru 08/02/2018.  PA # [id number].  [id number] and [id number].  Sander Nephew, RN 02/05/2019 10:51 AM.

## 2019-02-04 NOTE — Telephone Encounter (Signed)
Requesting PA on traMADol (ULTRAM) 50 MG tablet. 

## 2019-02-08 NOTE — Telephone Encounter (Signed)
Has been approved.

## 2019-02-12 DIAGNOSIS — J439 Emphysema, unspecified: Secondary | ICD-10-CM | POA: Diagnosis not present

## 2019-02-15 ENCOUNTER — Telehealth: Payer: Self-pay | Admitting: Adult Health

## 2019-02-15 NOTE — Telephone Encounter (Signed)
The CT has been scheduled on 02/22/2019 @ 1:15pm. Patient is aware of appt and location

## 2019-02-15 NOTE — Telephone Encounter (Signed)
I have called Stacey at [facility] to schedule this CT

## 2019-02-18 ENCOUNTER — Other Ambulatory Visit: Payer: Self-pay | Admitting: Pulmonary Disease

## 2019-02-22 ENCOUNTER — Inpatient Hospital Stay: Admission: RE | Admit: 2019-02-22 | Payer: Medicaid Other | Source: Ambulatory Visit

## 2019-03-04 ENCOUNTER — Ambulatory Visit: Payer: Medicaid Other | Admitting: Pulmonary Disease

## 2019-03-04 ENCOUNTER — Other Ambulatory Visit: Payer: Self-pay | Admitting: Internal Medicine

## 2019-03-04 DIAGNOSIS — K860 Alcohol-induced chronic pancreatitis: Secondary | ICD-10-CM

## 2019-03-04 MED ORDER — TRAMADOL HCL 50 MG PO TABS: 100.0000 mg | ORAL_TABLET | Freq: Two times a day (BID) | ORAL | 0 refills | Status: DC

## 2019-03-04 NOTE — Telephone Encounter (Signed)
Refill Request  traMADol (ULTRAM) 50 MG tablet   [facility] [place] - [place], [place] - [address] 

## 2019-03-10 ENCOUNTER — Ambulatory Visit (INDEPENDENT_AMBULATORY_CARE_PROVIDER_SITE_OTHER)
Admission: RE | Admit: 2019-03-10 | Discharge: 2019-03-10 | Disposition: A | Discharge status: Home / Self Care | Payer: Medicaid Other | Source: Ambulatory Visit | Attending: Adult Health | Admitting: Adult Health

## 2019-03-10 ENCOUNTER — Other Ambulatory Visit: Payer: Self-pay

## 2019-03-10 DIAGNOSIS — R911 Solitary pulmonary nodule: Secondary | ICD-10-CM | POA: Diagnosis not present

## 2019-03-10 DIAGNOSIS — R0602 Shortness of breath: Secondary | ICD-10-CM | POA: Diagnosis not present

## 2019-03-18 ENCOUNTER — Encounter: Payer: Self-pay | Admitting: Pulmonary Disease

## 2019-03-18 ENCOUNTER — Telehealth: Payer: Self-pay

## 2019-03-18 ENCOUNTER — Other Ambulatory Visit: Payer: Self-pay | Admitting: Pulmonary Disease

## 2019-03-18 ENCOUNTER — Ambulatory Visit: Payer: Medicaid Other | Admitting: Pulmonary Disease

## 2019-03-18 ENCOUNTER — Other Ambulatory Visit: Payer: Self-pay

## 2019-03-18 VITALS — BP 128/76 | HR 111 | Temp 97.1°F | Ht 68.0 in | Wt 132.2 lb

## 2019-03-18 DIAGNOSIS — J432 Centrilobular emphysema: Secondary | ICD-10-CM

## 2019-03-18 DIAGNOSIS — J439 Emphysema, unspecified: Secondary | ICD-10-CM

## 2019-03-18 DIAGNOSIS — R918 Other nonspecific abnormal finding of lung field: Secondary | ICD-10-CM

## 2019-03-18 DIAGNOSIS — R0609 Other forms of dyspnea: Secondary | ICD-10-CM

## 2019-03-18 DIAGNOSIS — Z23 Encounter for immunization: Secondary | ICD-10-CM

## 2019-03-18 NOTE — Patient Instructions (Signed)
You have the following breathing problems: 1) Severe COPD with emphysema. 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  This takes up about 30% of your right lung space.  We are arranging for an appointment with a chest surgeon to determine if you are a candidate to have this removed. 3) Lung nodules.  These looked better on your CT chest scan from March 10, 2019 and likely were related to prior infections that have cleared.  You don't need anymore follow up CT chest scans for these.   You need a flu shot today  Follow up in 4 months

## 2019-03-18 NOTE — Telephone Encounter (Signed)
Requesting to speak with Dr. Truman Hayward about CT scan results. Please call pt back.

## 2019-03-18 NOTE — Progress Notes (Signed)
[facility]  Chief Complaint  Patient presents with  . Centrilobular emphysema    Patient stated since last visit he gets out of breath just rolling over in bed.    Constitutional:  BP 128/76 (BP Location: Right Arm, Patient Position: Sitting, Cuff Size: Normal)   Pulse (!) 111   Temp (!) 97.1 F (36.2 C)   Ht 5\' 8"  (1.727 m)   Wt 132 lb 3.2 oz (60 kg)   SpO2 98%   BMI 20.10 kg/m   Past Medical History:  Chronic pancreatitis, Depression, ETOH, HLD, Psoriasis, PUD, Rt PTX  Brief Summary:  [name] is a 58 y.o. male former smoker with COPD and bullous emphysema.  He had follow up CT chest on 9/16.  LUL nodule and band like consolidation resolved.  Other nodule stable and benign.  He gets winded with minimal activity.  He is having trouble sleeping at night because of his breathing issues.  He also gets winded in hot shower.  He isn't smoking.  He feels his nebulizer medication helps.  He is applying for disability.  He is asking about whether he could get a wheelchair.  Physical Exam:   Appearance - thin  ENMT - no sinus tenderness, no nasal discharge, no oral exudate  Neck - no masses, trachea midline, no thyromegaly, no elevation in JVP  Respiratory - normal appearance of chest wall, normal respiratory effort w/o accessory muscle use, no dullness on percussion, no wheezing or rales, decreased BS b/l  CV - s1s2 regular rate and rhythm, no murmurs, no peripheral edema, radial pulses symmetric  GI - soft, non tender  Lymph - no adenopathy noted in neck and axillary areas  MSK - normal gait  Ext - no cyanosis, clubbing, or joint inflammation noted  Skin - no rashes, lesions, or ulcers  Neuro - normal strength, oriented x 3  Psych - normal mood and affect   Assessment/Plan:   COPD with emphysema - continue yupelri, perforomist, and pulmicort - prn albuterol - flu shot today - advised him to forward forms to 10/16 to  complete for his disability application; in my opinion he should qualify for full and complete disability based on the severity of his respiratory conditions  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 - this is likely contributing significantly to his symptoms of dyspnea - will ask thoracic surgery to assess whether he is a candidate for bullectomy  Lung nodule. - CT chest from 03/10/19 looked better - he doesn't need additional f/u imaging for these  Psoriasis. - he is on MTX with dermatology from [facility]   Patient Instructions  You have the following breathing problems: 1) Severe COPD with emphysema. 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  This takes up about 30% of your right lung space.  We are arranging for an appointment with a chest surgeon to determine if you are a candidate to have this removed. 3) Lung nodules.  These looked better on your CT chest scan from March 10, 2019 and likely were related to prior infections that have cleared.  You don't need anymore follow up CT chest scans for these.   You need a flu shot today  Follow up in 4 months  A total of  28 minutes were spent face to face with the patient and more than half of that time involved counseling or coordination of care.   March 12, 2019, MD [facility] Pulmonary/Critical  Care Pager: [phone] 03/18/2019, 2:42 PM  Flow Sheet     Pulmonary tests:  PFT 07/14/17 >> FEV1 1.69 (48%), FEV1% 41, TLC 7.09 (107%), RV 3.41 (168%), DLCO 32% A1AT 05/27/18 >> 192, MM ONO with RA 06/09/18 >> test time 8 hrs 59 min.  Baseline SpO2 95%, low SpO2 90%  Chest imaging:  CT angio chest 05/22/18 >> severe emphysema, moderate bullous area Rt upper lung, 2 mm nodule RLL, 7 mm nodule in lingula, 21 mm nodule LLL CT chest 09/09/18 >> bullous emphysema, mod/severe centrilobular emphysema, diffuse bronchial wall thickening, resolved nodule LLL, new 6 mm solid nodule LUL, new bandlike consolidation RUP CT chest  03/10/19 >> LUL nodule resolved, stable 3 mm nodule RUL  Cardiac tests:  Echo 10/11/16 >> EF 69%, grade 1 DD  Medications:   Allergies as of 03/18/2019      Reactions   Nsaids Other (See Comments)   Bleeding gastritis      Medication List       Accurate as of March 18, 2019  2:42 PM. If you have any questions, ask your nurse or doctor.        albuterol (2.5 MG/3ML) 0.083% nebulizer solution Commonly known as: PROVENTIL Take 3 mLs (2.5 mg total) by nebulization every 6 (six) hours as needed for wheezing or shortness of breath.   albuterol 108 (90 Base) MCG/ACT inhaler Commonly known as: ProAir HFA Inhale 2 puffs into the lungs every 6 (six) hours as needed for wheezing or shortness of breath.   amitriptyline 10 MG tablet Commonly known as: ELAVIL Take 2 tablets (20 mg total) by mouth at bedtime. Can increase to two tablets after 2 weeks   atorvastatin 40 MG tablet Commonly known as: LIPITOR Take 1 tablet (40 mg total) by mouth daily.   Perforomist 20 MCG/2ML nebulizer solution Generic drug: formoterol Take 2 mLs (20 mcg total) by nebulization 2 (two) times daily. DX: J44.9   Pulmicort 0.5 MG/2ML nebulizer solution Generic drug: budesonide INHALE 1 VIAL BY NEBULIZATION 2 TIMES DAILY.   traMADol 50 MG tablet Commonly known as: ULTRAM Take 2 tablets (100 mg total) by mouth 2 (two) times daily. AS NEEDED   triamcinolone cream 0.1 % Commonly known as: KENALOG Apply 1 application topically 2 (two) times daily.   Yupelri 175 MCG/3ML Soln Generic drug: Revefenacin Take 175 mcg by nebulization daily. DX:J44.9       Past Surgical History:  He  has a past surgical history that includes Laparoscopic cholecystectomy single site with intraoperative cholangiogram (N/A, 11/28/2016); Liver biopsy (N/A, 11/28/2016); Laparoscopic cholecystectomy (11/28/2016); Tympanostomy tube placement (Bilateral, ~ 1970); and Esophagogastroduodenoscopy (egd) with propofol (N/A, 12/04/2016).   Family History:  His family history includes Cancer in his father; Diabetes in his mother; Heart disease in his father.  Social History:  He  reports that he quit smoking about 9 months ago. His smoking use included cigarettes. He has a 12.00 pack-year smoking history. He has never used smokeless tobacco. He reports that he does not drink alcohol or use drugs.

## 2019-03-19 NOTE — Telephone Encounter (Signed)
Discussed with [name] on his mobile phone regarding CT results and his appointment with Dr.Sood. Reassured [name] that based on the CT result description, I agree with Dr.Sood's recommendation to no longer needing follow up CT scans. Discussed current plan to f/u with surgeon and discussed with [name] that the surgeon will be able to have more informed conversation with him regarding his chance of improvement with bullectomy. [name] expressed understanding. All other questions and concerns addressed.

## 2019-03-26 ENCOUNTER — Encounter: Payer: Self-pay | Admitting: Thoracic Surgery (Cardiothoracic Vascular Surgery)

## 2019-03-26 ENCOUNTER — Institutional Professional Consult (permissible substitution) (INDEPENDENT_AMBULATORY_CARE_PROVIDER_SITE_OTHER): Payer: Medicaid Other | Admitting: Thoracic Surgery (Cardiothoracic Vascular Surgery)

## 2019-03-26 ENCOUNTER — Other Ambulatory Visit: Payer: Self-pay

## 2019-03-26 ENCOUNTER — Other Ambulatory Visit: Payer: Self-pay | Admitting: *Deleted

## 2019-03-26 VITALS — BP 110/76 | HR 97 | Temp 97.9°F | Resp 16 | Ht 68.0 in | Wt 132.6 lb

## 2019-03-26 DIAGNOSIS — J430 Unilateral pulmonary emphysema [MacLeod's syndrome]: Secondary | ICD-10-CM | POA: Diagnosis not present

## 2019-03-26 DIAGNOSIS — J439 Emphysema, unspecified: Secondary | ICD-10-CM | POA: Diagnosis not present

## 2019-03-26 NOTE — Progress Notes (Signed)
[address]       [place] [address]             [id number]                    [facility] Medical Record #[id number] Date of Birth: [birth year]  Referring: Coralyn Helling, MD Primary Care: Theotis Barrio, MD Primary Cardiologist: No primary care provider on file.  Chief Complaint:    Chief Complaint  Patient presents with   New Patient (Initial Visit)    Consultation for bullous emphysema, Chest CT 9/16    History of Present Illness:    [name] 58 y.o. male referred by Dr. Craige Cotta for surgical evaluation bullous emphysema severe COPD.  He states that over the last several years she has experienced worsening dyspnea.  He is currently unable to walk some with shortness of breath.  In 2018 he was treated at [facility] for spontaneous pneumothorax and underwent chest tube placement.  He has been cigarette free for over a year, and is currently been optimized medically.  He has not had any discussion with the lung transplant surgery, but thinks his insurance may not allow him to be a good candidate.  In regards to her symptoms, he has dyspnea on short distances and is unable to do most of his daily activities.  He admits to chronic wet cough with clear secretions.  He denies any chest pain.    Smoking Hx: He  reports that he quit smoking about 9 months ago. His smoking use included cigarettes. He has a 12.00 pack-year smoking history. He has never used smokeless tobacco. He reports that he does not drink alcohol or use drugs.   Zubrod Score: At the time of surgery this patients most appropriate activity status/level should be described as: []     0    Normal activity, no symptoms []     1    Restricted in physical strenuous activity but ambulatory, able to do out light work [x]     2    Ambulatory and capable of self care, unable to do work activities, up and about               >50 % of waking hours                              []      3    Only limited self care, in bed greater than 50% of waking hours []     4    Completely disabled, no self care, confined to bed or chair []     5    Moribund   Past Medical History:  Diagnosis Date   Chronic pancreatitis (HCC)    Depression    Duodenitis    by EGD 1/07   Emphysema    unclear diagnosis noted in previous EMR   History of alcohol abuse    Hyperlipidemia    Psoriasis    extensive, responds to strong steroids, unable to start molecular therapies due to cost   RUE weakness    2/2 cervical spondylosis   Stomach ulcer     Past Surgical History:  Procedure Laterality Date   ESOPHAGOGASTRODUODENOSCOPY (EGD) WITH PROPOFOL N/A 12/04/2016   Procedure: ESOPHAGOGASTRODUODENOSCOPY (EGD) WITH PROPOFOL;  Surgeon: , MD;  Location: [facility];  Service: Gastroenterology;  Laterality: N/A;   LAPAROSCOPIC  CHOLECYSTECTOMY  11/28/2016   w/IOC   LAPAROSCOPIC CHOLECYSTECTOMY SINGLE SITE WITH INTRAOPERATIVE CHOLANGIOGRAM N/A 11/28/2016   Procedure: LAPAROSCOPIC CHOLECYSTECTOMY SINGLE SITE WITH INTRAOPERATIVE CHOLANGIOGRAM;  Surgeon: Michael Boston, MD;  Location: [facility] ORS;  Service: General;  Laterality: N/A;   LIVER BIOPSY N/A 11/28/2016   Procedure: NEEDLE CORE LIVER BIOPSY;  Surgeon: Michael Boston, MD;  Location: [facility] ORS;  Service: General;  Laterality: N/A;   TYMPANOSTOMY TUBE PLACEMENT Bilateral ~ 1970    Family History  Problem Relation Age of Onset   Diabetes Mother    Heart disease Father    Cancer Father        ?kidney or liver cancer     Social History   Tobacco Use  Smoking Status Former Smoker   Packs/day: 0.30   Years: 40.00   Pack years: 12.00   Types: Cigarettes   Quit date: 05/19/2018   Years since quitting: 0.8  Smokeless Tobacco Never Used  Tobacco Comment   5 cigs/day.    Social History   Substance and Sexual Activity  Alcohol Use No   Frequency: Never   Comment: quit x 10 yrs.     Allergies  Allergen  Reactions   Nsaids Other (See Comments)    Bleeding gastritis    Current Outpatient Medications  Medication Sig Dispense Refill   albuterol (PROVENTIL) (2.5 MG/3ML) 0.083% nebulizer solution Take 3 mLs (2.5 mg total) by nebulization every 6 (six) hours as needed for wheezing or shortness of breath. 75 mL 0   albuterol (VENTOLIN HFA) 108 (90 Base) MCG/ACT inhaler INHALE 2 PUFFS INTO THE LUNGS EVERY 6 (SIX) HOURS AS NEEDED FOR WHEEZING OR SHORTNESS OF BREATH. 8.5 g 5   amitriptyline (ELAVIL) 10 MG tablet Take 2 tablets (20 mg total) by mouth at bedtime. Can increase to two tablets after 2 weeks 180 tablet 3   atorvastatin (LIPITOR) 40 MG tablet Take 1 tablet (40 mg total) by mouth daily. 90 tablet 3   formoterol (PERFOROMIST) 20 MCG/2ML nebulizer solution Take 2 mLs (20 mcg total) by nebulization 2 (two) times daily. DX: J44.9 120 mL 3   PULMICORT 0.5 MG/2ML nebulizer solution INHALE 1 VIAL BY NEBULIZATION 2 TIMES DAILY. 120 mL 1   Revefenacin (YUPELRI) 175 MCG/3ML SOLN Take 175 mcg by nebulization daily. DX:J44.9 90 mL 3   traMADol (ULTRAM) 50 MG tablet Take 2 tablets (100 mg total) by mouth 2 (two) times daily. AS NEEDED 120 tablet 0   triamcinolone cream (KENALOG) 0.1 % Apply 1 application topically 2 (two) times daily. 80 g 0   No current facility-administered medications for this visit.     Review of Systems  Constitutional: Positive for malaise/fatigue. Negative for diaphoresis and weight loss.  HENT: Negative.   Eyes: Negative.   Respiratory: Positive for cough, sputum production and shortness of breath.   Cardiovascular: Negative.   Gastrointestinal: Negative.   Musculoskeletal: Negative.   Skin: Negative.   Neurological: Negative.      PHYSICAL EXAMINATION: BP 110/76    Pulse 97    Temp 97.9 F (36.6 C) (Skin)    Ht 5\' 8"  (1.727 m)    Wt 132 lb 9.6 oz (60.1 kg)    SpO2 94% Comment: RA   BMI 20.16 kg/m  Physical Exam  Constitutional: He is oriented to person,  place, and time.  thin  HENT:  Head: Normocephalic and atraumatic.  Eyes: Conjunctivae and EOM are normal. No scleral icterus.  Neck: Normal range of motion. No tracheal deviation  present.  Cardiovascular: Normal rate and regular rhythm.  No murmur heard. Respiratory: Effort normal. No respiratory distress.  Distant breath sounds, right > L  GI: Soft. He exhibits no distension.  Musculoskeletal: Normal range of motion.        General: No edema.  Neurological: He is alert and oriented to person, place, and time.  Skin: Skin is warm and dry.    Diagnostic Studies & Laboratory data:     Recent Radiology Findings:   Ct Chest Wo Contrast  Result Date: 03/11/2019 CLINICAL DATA:  Left upper lobe nodule follow up. Increasing shortness of breath with productive cough and wheezing. EXAM: CT CHEST WITHOUT CONTRAST TECHNIQUE: Multidetector CT imaging of the chest was performed following the standard protocol without IV contrast. COMPARISON:  Multiple exams, including 09/09/2018 FINDINGS: Cardiovascular: Coronary, aortic arch, and branch vessel atherosclerotic vascular disease. Mediastinum/Nodes: Unremarkable Lungs/Pleura: Severe emphysema. Bilateral airway thickening. Scarring at the right lung apex with reduced nodular component. Large right upper lobe bulla. The previous left upper lobe pulmonary nodule which measured 0.6 by 0.4 cm by my measurements has currently resolved. Stable 5 by 3 mm subpleural nodule in the right upper lobe along the minor fissure, image 94/3, not significantly changed from 07/25/2006 hence considered benign. No new nodules are identified. Upper Abdomen: Punctate calcifications along the pancreatic parenchyma suggesting chronic calcific pancreatitis. Musculoskeletal: Unremarkable IMPRESSION: 1. Previous left upper lobe 6 by 4 mm pulmonary nodule has resolved. 2. Chronically stable 4 mm right upper lobe subpleural nodule, no change from 2008, considered benign. 3. Reduced nodular  component of the scarring in the right upper lobe. 4. Aortic Atherosclerosis (ICD10-I70.0) and Emphysema (ICD10-J43.9). Coronary atherosclerosis. 5. Chronic calcific pancreatitis. Electronically Signed   By: Gaylyn RongWalter  Liebkemann M.D.   On: 03/11/2019 08:26       I have independently reviewed the above radiology studies  and reviewed the findings with the patient.   Recent Lab Findings:    PFTs: 07/14/17 - FVC: 76% - FEV1: 40% -DLCO: 32% - TLC: 107% - RV: 168%  Problem List: Severe COPD with giant bullae  Assessment / Plan:   58 yo male with severe COPD that is most severe in the right upper lobe.  He essentially would require a unilateral lung volume reduction surgery.  Typically, a giant bullae would be a contraindication, but he also has several criteria that would make him a possible candidate.  I will obtain a new set of pulmonary function tests to assess his current status.  I explained to him that if his pulmonary function tests are much worst, he would not be a candidate for lung volume reduction surgery, and would likely need further evaluation by a lung transplant team.       I  spent 40 minutes with  the patient face to face and greater then 50% of the time was spent in counseling and coordination of care.    [name] 03/26/2019 9:56 AM

## 2019-03-29 IMAGING — NM NM HEPATO W/GB/PHARM/[PERSON_NAME]
1 series · 6 of 6 positions shown · non-contrast
Comparison: Abdominal MRI from 09/21/2016

CLINICAL DATA: Right upper quadrant pain, chronic. Chronic
cholecystitis.

EXAM:
NUCLEAR MEDICINE HEPATOBILIARY IMAGING WITH GALLBLADDER EF
TECHNIQUE: Sequential images of the abdomen were obtained [DATE] minutes
following intravenous administration of radiopharmaceutical. After
oral ingestion of Ensure, gallbladder ejection fraction was
determined. At 60 min, normal ejection fraction is greater than 33%.
RADIOPHARMACEUTICALS:  5.2 mCi Lc-SSm  Choletec IV

[he hepatobiliary · 3.43mm/px · 6 of 60 frames shown]
[frame 6/60]
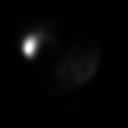
[frame 16/60]
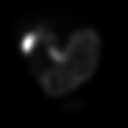
[frame 26/60]
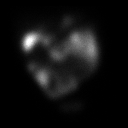
[frame 36/60]
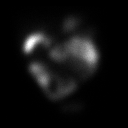
[frame 46/60]
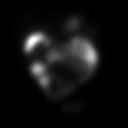
[frame 56/60]
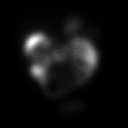

[6 of 6 positions shown; findings below may reference images not displayed]

FINDINGS: Satisfactory uptake of radiopharmaceutical from the blood pool.
Biliary activity visible at 14 minutes. Bowel activity visible at 18
minutes. Gallbladder activity visible at 24 minutes.

The patient did not experience symptoms upon consuming the fatty
meal.

Calculated gallbladder ejection fraction is 78%. (Normal gallbladder
ejection fraction with Ensure is greater than 33%.)
IMPRESSION: 1. Normal hepatobiliary scan, gallbladder ejection fraction is 78%.

## 2019-04-02 ENCOUNTER — Inpatient Hospital Stay (HOSPITAL_COMMUNITY): Admission: RE | Admit: 2019-04-02 | Payer: Medicaid Other | Source: Ambulatory Visit

## 2019-04-02 ENCOUNTER — Other Ambulatory Visit: Payer: Self-pay

## 2019-04-02 DIAGNOSIS — K860 Alcohol-induced chronic pancreatitis: Secondary | ICD-10-CM

## 2019-04-02 MED ORDER — TRAMADOL HCL 50 MG PO TABS: 100.0000 mg | ORAL_TABLET | Freq: Two times a day (BID) | ORAL | 0 refills | Status: DC

## 2019-04-02 NOTE — Telephone Encounter (Signed)
traMADol (ULTRAM) 50 MG tablet   Refill request @  [facility] [place] - [place], [place] - [address] [phone] (Phone) [phone] (Fax)    

## 2019-04-03 ENCOUNTER — Other Ambulatory Visit (HOSPITAL_COMMUNITY)
Admission: RE | Admit: 2019-04-03 | Discharge: 2019-04-03 | Disposition: A | Discharge status: Home / Self Care | Payer: Medicaid Other | Source: Ambulatory Visit | Attending: Thoracic Surgery (Cardiothoracic Vascular Surgery) | Admitting: Thoracic Surgery (Cardiothoracic Vascular Surgery)

## 2019-04-03 DIAGNOSIS — Z20828 Contact with and (suspected) exposure to other viral communicable diseases: Secondary | ICD-10-CM | POA: Insufficient documentation

## 2019-04-03 DIAGNOSIS — Z01812 Encounter for preprocedural laboratory examination: Secondary | ICD-10-CM | POA: Diagnosis not present

## 2019-04-04 LAB — NOVEL CORONAVIRUS, NAA (HOSP ORDER, SEND-OUT TO REF LAB; TAT 18-24 HRS): SARS-CoV-2, NAA: NOT DETECTED

## 2019-04-06 ENCOUNTER — Ambulatory Visit (HOSPITAL_COMMUNITY)
Admission: RE | Admit: 2019-04-06 | Discharge: 2019-04-06 | Disposition: A | Discharge status: Home / Self Care | Payer: Medicaid Other | Source: Ambulatory Visit | Attending: Thoracic Surgery (Cardiothoracic Vascular Surgery) | Admitting: Thoracic Surgery (Cardiothoracic Vascular Surgery)

## 2019-04-06 ENCOUNTER — Other Ambulatory Visit: Payer: Self-pay

## 2019-04-06 DIAGNOSIS — J439 Emphysema, unspecified: Secondary | ICD-10-CM | POA: Insufficient documentation

## 2019-04-06 LAB — PULMONARY FUNCTION TEST
DL/VA % pred: 37 %
DL/VA: 1.63 ml/min/mmHg/L
DLCO unc % pred: 33 %
DLCO unc: 8.93 ml/min/mmHg
FEF 25-75 Post: 1.01 L/sec
FEF 25-75 Pre: 0.76 L/sec
FEF2575-%Change-Post: 33 %
FEF2575-%Pred-Post: 33 %
FEF2575-%Pred-Pre: 25 %
FEV1-%Change-Post: 9 %
FEV1-%Pred-Post: 46 %
FEV1-%Pred-Pre: 42 %
FEV1-Post: 1.62 L
FEV1-Pre: 1.48 L
FEV1FVC-%Change-Post: -27 %
FEV1FVC-%Pred-Pre: 75 %
FEV6-%Change-Post: 27 %
FEV6-%Pred-Post: 75 %
FEV6-%Pred-Pre: 58 %
FEV6-Post: 3.28 L
FEV6-Pre: 2.57 L
FEV6FVC-%Change-Post: -15 %
FEV6FVC-%Pred-Post: 88 %
FEV6FVC-%Pred-Pre: 104 %
FVC-%Change-Post: 50 %
FVC-%Pred-Post: 85 %
FVC-%Pred-Pre: 56 %
FVC-Post: 3.89 L
FVC-Pre: 2.57 L
Post FEV1/FVC ratio: 42 %
Post FEV6/FVC ratio: 84 %
Pre FEV1/FVC ratio: 57 %
Pre FEV6/FVC Ratio: 100 %
RV % pred: 152 %
RV: 3.12 L
TLC % pred: 107 %
TLC: 7.04 L

## 2019-04-06 MED ORDER — ALBUTEROL SULFATE (2.5 MG/3ML) 0.083% IN NEBU
2.5000 mg | INHALATION_SOLUTION | Freq: Once | RESPIRATORY_TRACT | Status: AC
  Administered 2019-04-06: 2.5 mg via RESPIRATORY_TRACT

## 2019-04-09 DIAGNOSIS — L400 Psoriasis vulgaris: Secondary | ICD-10-CM | POA: Diagnosis not present

## 2019-04-13 DIAGNOSIS — L400 Psoriasis vulgaris: Secondary | ICD-10-CM | POA: Diagnosis not present

## 2019-04-30 ENCOUNTER — Other Ambulatory Visit: Payer: Self-pay | Admitting: Pulmonary Disease

## 2019-05-03 ENCOUNTER — Other Ambulatory Visit: Payer: Self-pay | Admitting: Internal Medicine

## 2019-05-03 DIAGNOSIS — K860 Alcohol-induced chronic pancreatitis: Secondary | ICD-10-CM

## 2019-05-05 ENCOUNTER — Other Ambulatory Visit: Payer: Self-pay | Admitting: Pulmonary Disease

## 2019-05-11 ENCOUNTER — Telehealth: Payer: Self-pay

## 2019-05-11 NOTE — Telephone Encounter (Signed)
Prior Auth was done for Yupelri 135mcg/3ml via phone call to [employer]. This is approved 05/11/19-05/05/20. [place] approval number is [id number].

## 2019-05-21 IMAGING — CR DG CHEST 2V
2 series · 2 of 2 positions shown · non-contrast
Comparison: 08/31/2011, 12/28/2008 and earlier.

CLINICAL DATA: 56-year-old who underwent cholecystectomy on
11/28/2016, presenting with acute onset of fever, chills, epigastric
abdominal pain, nausea and vomiting, and shortness of breath.
Current smoker with current history of COPD.

EXAM:
CHEST  2 VIEW

[chest pa]
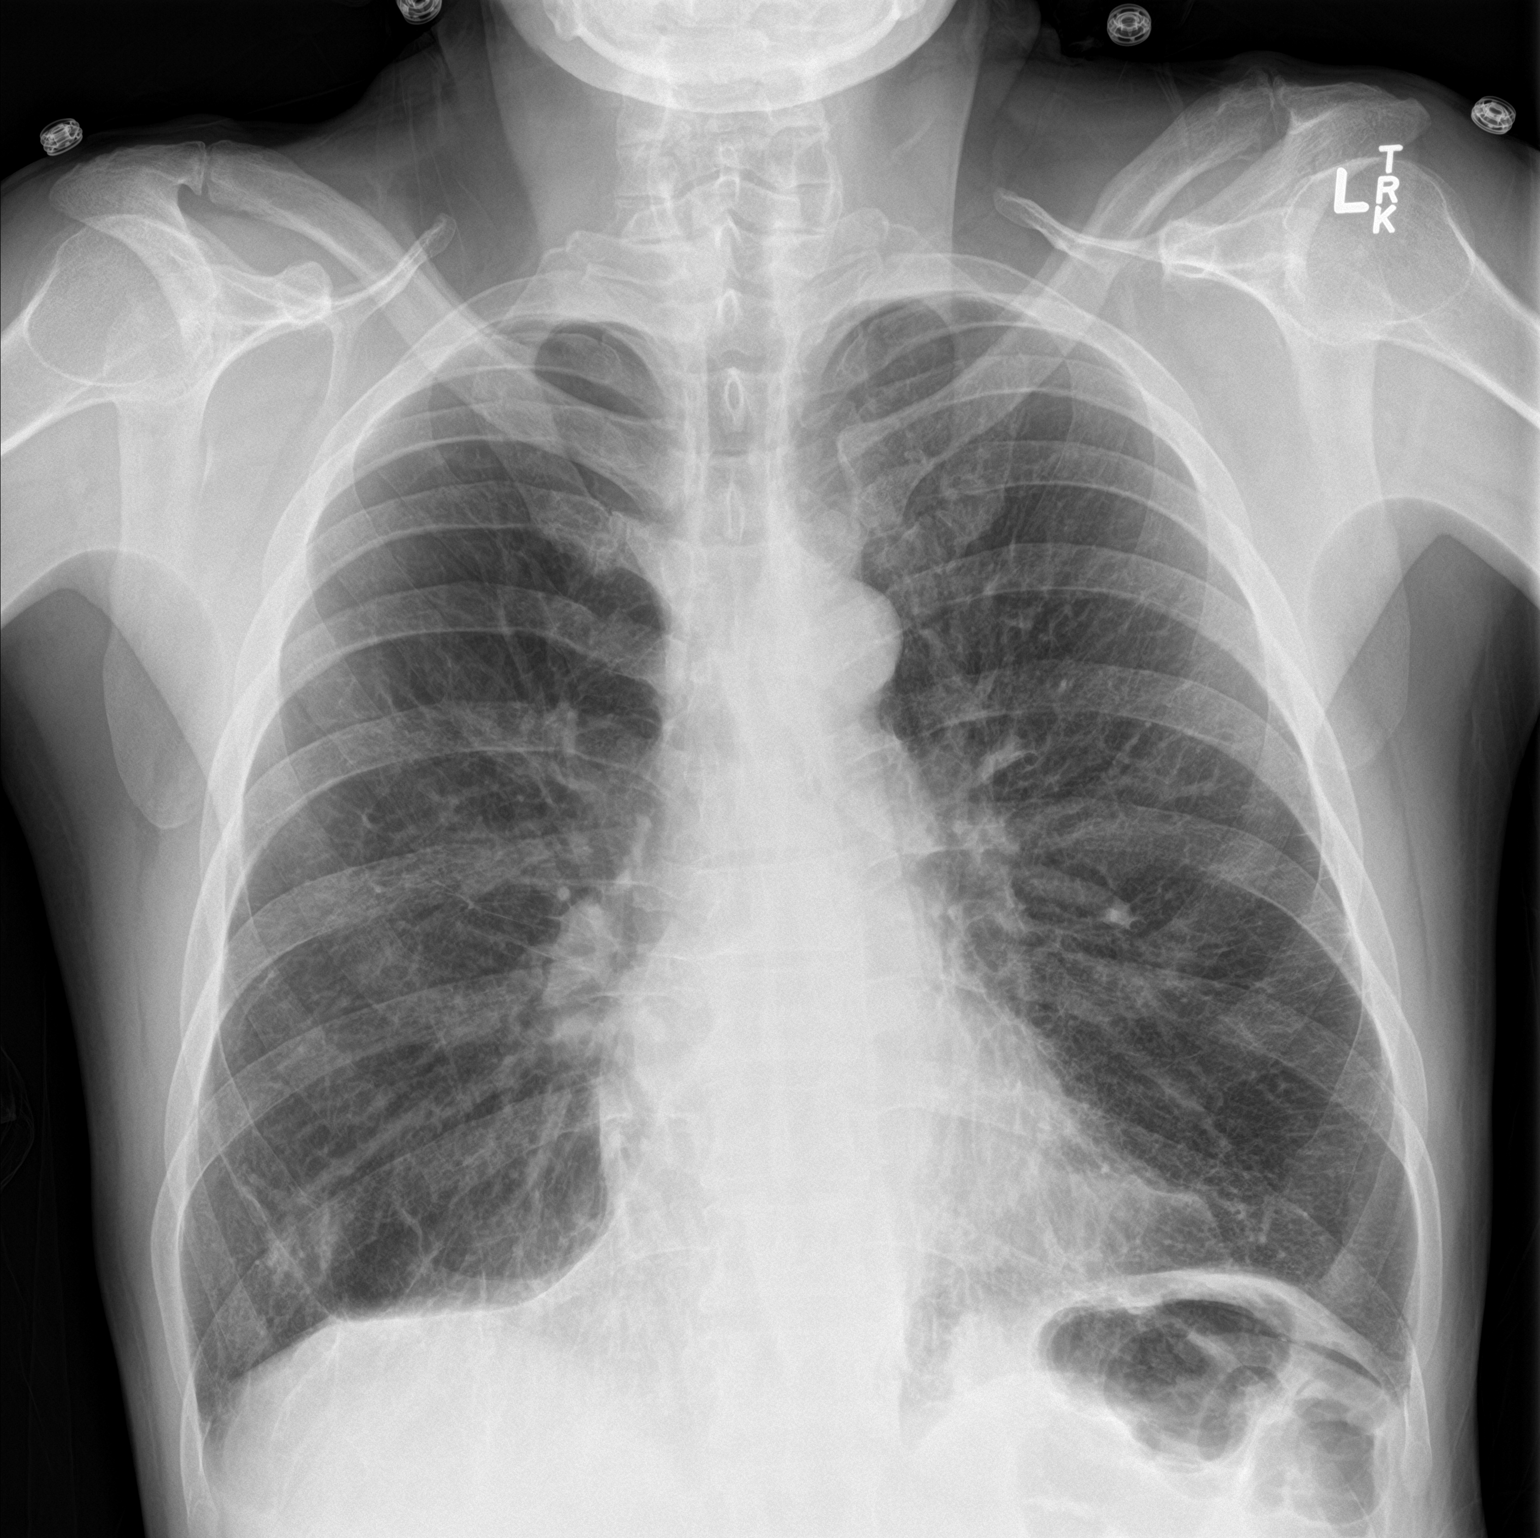

[chest lat]
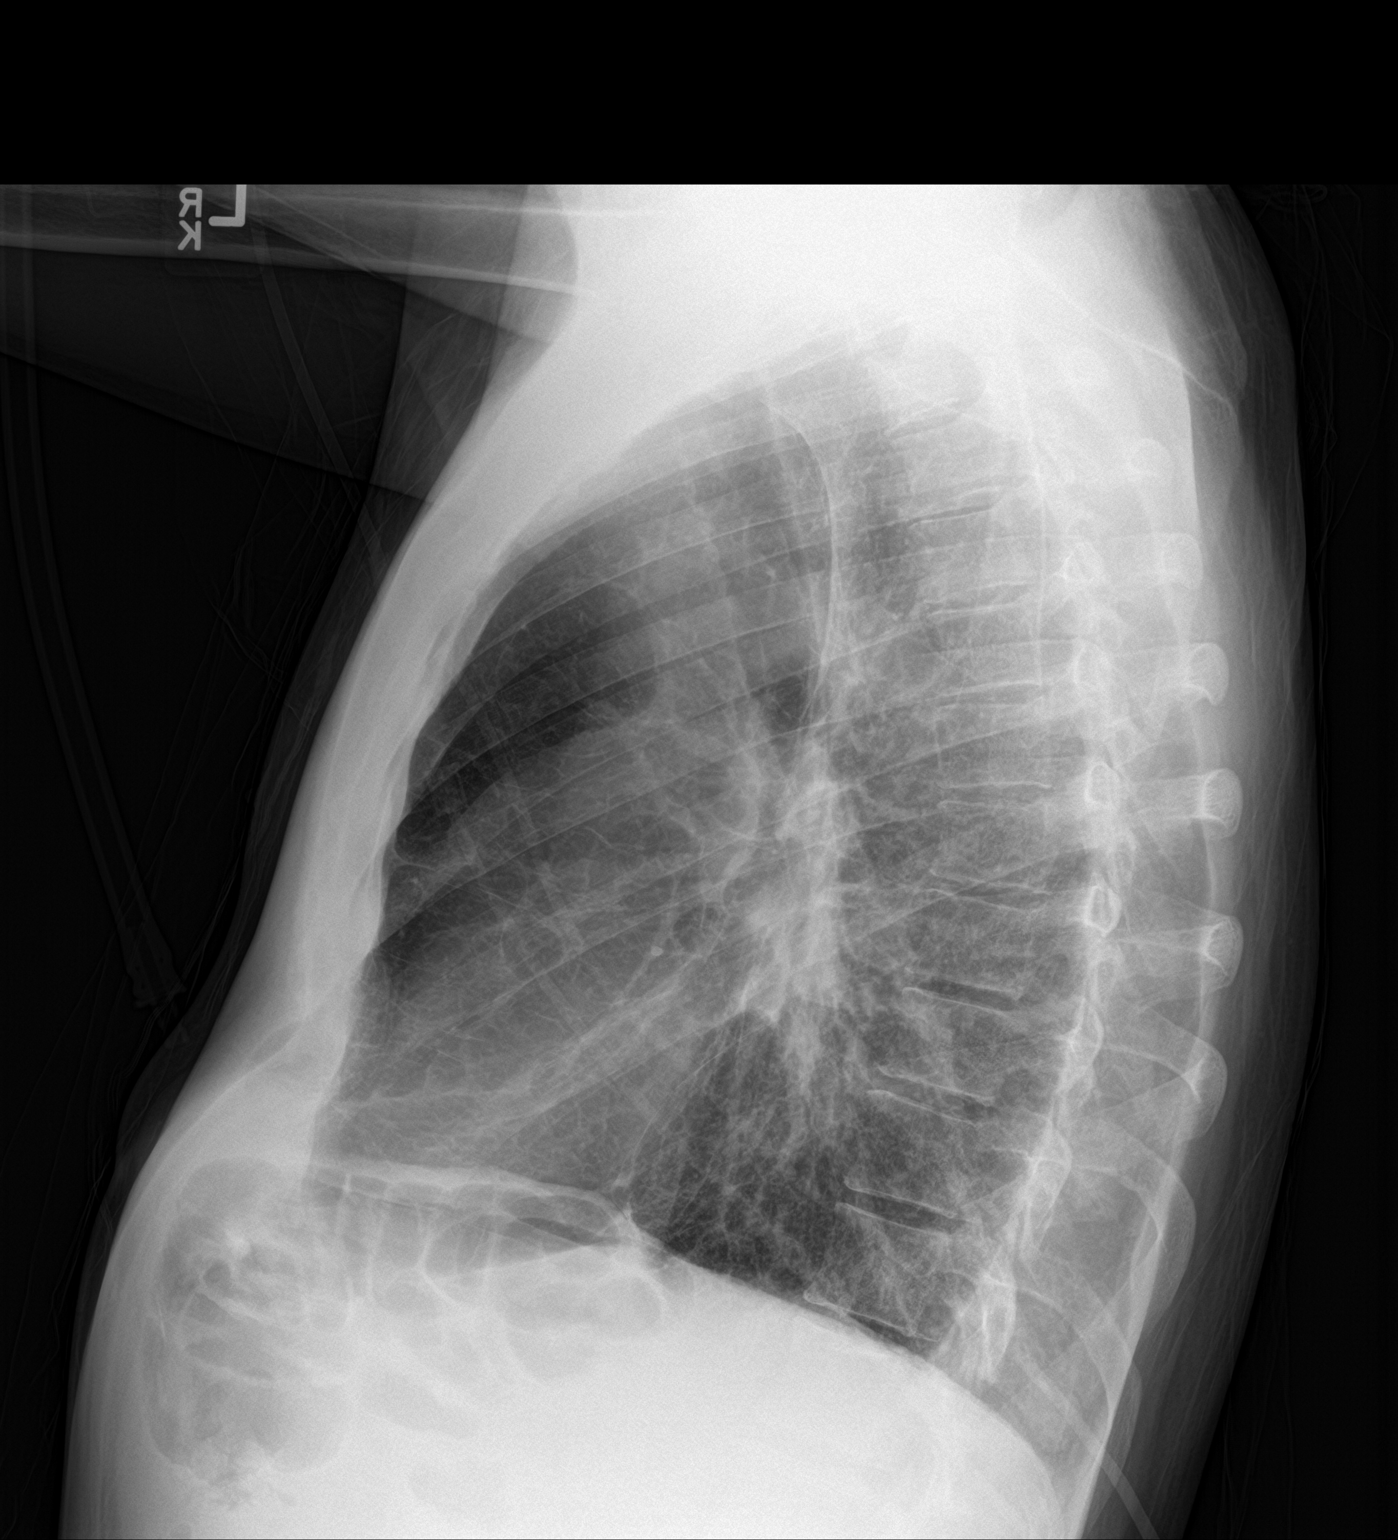

[2 of 2 positions shown; findings below may reference images not displayed]

FINDINGS: Cardiac silhouette normal in size, unchanged. Thoracic aorta
minimally atherosclerotic. Hilar and mediastinal contours otherwise
unremarkable. Emphysematous changes throughout both lungs, prominent
bronchovascular markings diffusely and moderate central
peribronchial thickening, unchanged. Parenchymal scarring involving
the right lower lobe, unchanged. Lungs otherwise clear. No confluent
airspace consolidation. No pleural effusions. Visualized bony thorax
intact.
IMPRESSION: COPD/emphysema. Scarring involving the right lower lobe. No acute
cardiopulmonary disease.

## 2019-05-31 ENCOUNTER — Other Ambulatory Visit: Payer: Self-pay | Admitting: Internal Medicine

## 2019-05-31 DIAGNOSIS — K860 Alcohol-induced chronic pancreatitis: Secondary | ICD-10-CM

## 2019-05-31 MED ORDER — TRAMADOL HCL 50 MG PO TABS: 100.0000 mg | ORAL_TABLET | Freq: Two times a day (BID) | ORAL | 0 refills | Status: DC

## 2019-05-31 NOTE — Telephone Encounter (Signed)
Refill Request  traMADol (ULTRAM) 50 MG tablet   [facility] [place] - [place], [place] - [address] 

## 2019-05-31 NOTE — Telephone Encounter (Signed)
Last refill was 05/03/19.

## 2019-06-08 DIAGNOSIS — J439 Emphysema, unspecified: Secondary | ICD-10-CM | POA: Diagnosis not present

## 2019-06-14 ENCOUNTER — Other Ambulatory Visit: Payer: Self-pay | Admitting: Pulmonary Disease

## 2019-06-29 ENCOUNTER — Other Ambulatory Visit: Payer: Self-pay | Admitting: Internal Medicine

## 2019-06-29 DIAGNOSIS — K860 Alcohol-induced chronic pancreatitis: Secondary | ICD-10-CM

## 2019-06-29 NOTE — Telephone Encounter (Signed)
Spoke with the pt.  Appt has been sch for 07/06/2019.

## 2019-06-29 NOTE — Telephone Encounter (Signed)
Refill Request  traMADol (ULTRAM) 50 MG tablet   [facility] AT [place] - [place], [place] - [address]

## 2019-06-30 MED ORDER — TRAMADOL HCL 50 MG PO TABS: 100.0000 mg | ORAL_TABLET | Freq: Two times a day (BID) | ORAL | 0 refills | Status: DC

## 2019-07-06 ENCOUNTER — Ambulatory Visit: Payer: Medicaid Other | Admitting: Internal Medicine

## 2019-07-06 ENCOUNTER — Ambulatory Visit (HOSPITAL_COMMUNITY)
Admission: RE | Admit: 2019-07-06 | Discharge: 2019-07-06 | Disposition: A | Discharge status: Home / Self Care | Payer: Medicaid Other | Source: Ambulatory Visit | Attending: Internal Medicine | Admitting: Internal Medicine

## 2019-07-06 ENCOUNTER — Other Ambulatory Visit: Payer: Self-pay

## 2019-07-06 ENCOUNTER — Telehealth: Payer: Self-pay | Admitting: Internal Medicine

## 2019-07-06 ENCOUNTER — Encounter: Payer: Self-pay | Admitting: Internal Medicine

## 2019-07-06 VITALS — BP 110/69 | HR 106 | Temp 97.8°F | Ht 68.0 in | Wt 130.6 lb

## 2019-07-06 DIAGNOSIS — Z8709 Personal history of other diseases of the respiratory system: Secondary | ICD-10-CM

## 2019-07-06 DIAGNOSIS — R911 Solitary pulmonary nodule: Secondary | ICD-10-CM

## 2019-07-06 DIAGNOSIS — R10816 Epigastric abdominal tenderness: Secondary | ICD-10-CM

## 2019-07-06 DIAGNOSIS — K860 Alcohol-induced chronic pancreatitis: Secondary | ICD-10-CM

## 2019-07-06 DIAGNOSIS — J439 Emphysema, unspecified: Secondary | ICD-10-CM

## 2019-07-06 DIAGNOSIS — R0602 Shortness of breath: Secondary | ICD-10-CM | POA: Diagnosis not present

## 2019-07-06 DIAGNOSIS — G8929 Other chronic pain: Secondary | ICD-10-CM | POA: Diagnosis not present

## 2019-07-06 DIAGNOSIS — Z79891 Long term (current) use of opiate analgesic: Secondary | ICD-10-CM

## 2019-07-06 NOTE — Assessment & Plan Note (Addendum)
Repeat scan showing resolution of left sided nodules both upper and lower lobes. Right upper lobe nodule stable. Dr.Sood recommend no need for additional imaging. Dr.Kommer expressed understanding.

## 2019-07-06 NOTE — Assessment & Plan Note (Signed)
On tramadol 100mg  BID. States his pain is well controlled. Does endorse some loss of appetite but denies any nausea, vomiting, diarrhea. Has gained about 20 lbs since last year which is reassuring. States eating lot of Ensure for nutrition supplementation.  is on chronic opioid therapy for chronic pain from chronic pancreatitis. As part of the treatment plan, the [place] controlled substance database is checked at least twice yearly and the database results was checked 07/06/19 and is appropriate.   The last UDS was on 10/2017 and results showed additional pain meds. F/u UDS delayed due to COVID. Patient needs a yearly UDS.  Pain contract was updated and signed this visit. Through the pain contract, CREE KUNERT describes commitment to avoid excessive opioid use and only relying on pain medications to pursue goal of continuing to be at home and stay out of the hospital.  The patient is on tramadol 100mg  BID, #120 per 30 days. Adjunctive treatment includes alcohol abstinence. This regimen allows JALONI DAVOLI to function without excessive sedation or side effects. The benefits of continuing opioid therapy outweigh the risks and chronic opioids will be continued. Ongoing education about safe opioid treatment is provided.    - C/w current regimen: tramadol 100mg  BID - F/u in 3 months

## 2019-07-06 NOTE — Assessment & Plan Note (Signed)
Presents w/ continued worsening of dyspnea on exertion. Currently undergoing referral to transplant program at [facility]. Large RUE emphysematous bullae noted on prior CT but CT surgery not recommending LVRS. Repeat PFT showing FEV/FVC ratio of 0.57 w/ FEV1 42% of predicted and FVC 56% of predicted. Consistent with COPD GOLD D w/ Grade 3 airflow obstruction with concurrent restrictive component. Does not seem to be in acute exacerbation but does have hx of spontaneous pneumothorax. Has diminished breaths sounds on exam.  - Will get stat Chest X-ray to r/o acute illness (pneumonia/pneumothorax) - Urged to follow up with Dr.Sood (pulm) as he is overdue - Advised to call back if worsening symptoms and can start steroid therapy.  Addendum: Chest X-ray reviewed with increased lung volumes with flattened diaphragm and thickened pleura along the oblique fissure interpreted as pleura-parenchymal scarring by radiology. No actionable acute findings. Spoke with Dr.Primeau and discussed radiology results and again urged him to f/u with Dr.Sood to optimize maintenance regimen.

## 2019-07-06 NOTE — Progress Notes (Signed)
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 He states that his respiratory status has had progressive decline since his last visit. He mentions that he was referred to CT surgery for lung volume reduction surgery by his pulmonologist, Dr.Sood, who recommended referral to Lung transplant with [facility]. He mentions being overdue for his follow up appointment with Dr.Sood. He states that he has been using his nebulizers as prescribed but is beginning to endorse worsening dyspnea, especially with exertion. He mentions using his rescue inhalers about 4 times daily. States it feels similar to his prior admission when he was found to have a pneumothorax because his lung collapsed. Denies any fevers, chills, productive sputum, cough, nausea, vomiting, chest pain, palpitations or sick contact.   Past Medical History:  Diagnosis Date  . Chronic pancreatitis (HCC)   . Depression   . Duodenitis    by EGD 1/07  . Emphysema    unclear diagnosis noted in previous EMR  . History of alcohol abuse   . Hyperlipidemia   . Psoriasis    extensive, responds to strong steroids, unable to start molecular therapies due to cost  . RUE weakness    2/2 cervical spondylosis  . Stomach ulcer    Review of Systems: Review of Systems  Constitutional: Negative for chills, fever and weight loss.  Respiratory: Positive for shortness of breath. Negative for cough and sputum production.   Cardiovascular: Negative for chest pain and palpitations.  Gastrointestinal: Negative for constipation, diarrhea, nausea and vomiting.     Physical Exam: Vitals:   07/06/19 1317  BP: 110/69  Pulse: (!) 106  Temp: 97.8 F (36.6 C)  TempSrc: Oral  SpO2: 99%  Weight: 130 lb 9.6 oz (59.2 kg)  Height: 5\' 8"  (1.727 m)   Physical Exam  Constitutional: He is oriented to  person, place, and time.  Chronically ill-appearing  HENT:  Mouth/Throat: Oropharynx is clear and moist.  Eyes: Conjunctivae are normal.  Cardiovascular: Regular rhythm, normal heart sounds and intact distal pulses.  No murmur heard. Tachycardic  Respiratory:  Distant breath sounds R>L. Accessory muscle use. No wheezes  GI: Soft. Bowel sounds are normal. There is abdominal tenderness (epigastric tenderness to palpation).  Musculoskeletal:        General: No edema. Normal range of motion.     Cervical back: Normal range of motion and neck supple.  Neurological: He is alert and oriented to person, place, and time.  Skin: Skin is warm and dry.   Assessment & Plan:   COPD with emphysema (HCC) Presents w/ continued worsening of dyspnea on exertion. Currently undergoing referral to transplant program at [facility]. Large RUE emphysematous bullae noted on prior CT but CT surgery not recommending LVRS. Repeat PFT showing FEV/FVC ratio of 0.57 w/ FEV1 42% of predicted and FVC 56% of predicted. Consistent with COPD GOLD D w/ Grade 3 airflow obstruction with concurrent restrictive component. Does not seem to be in acute exacerbation but does have hx of spontaneous pneumothorax. Has diminished breaths sounds on exam.  - Will get stat Chest X-ray to r/o acute illness (pneumonia/pneumothorax) - Urged to follow up with Dr.Sood (pulm) as he is overdue - Advised to call back if worsening symptoms and can start steroid therapy.  Addendum: Chest X-ray reviewed with increased lung volumes with flattened diaphragm and thickened pleura along  the oblique fissure interpreted as pleura-parenchymal scarring by radiology. No actionable acute findings. Spoke with Dr.Boxer and discussed radiology results and again urged him to f/u with Dr.Sood to optimize maintenance regimen.   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 States his pain is well controlled. Does endorse some  loss of appetite but denies any nausea, vomiting, diarrhea. Has gained about 20 lbs since last year which is reassuring. States eating lot of Ensure for nutrition supplementation.  Haywood Lasso is on chronic opioid therapy for chronic pain from chronic pancreatitis. As part of the treatment plan, the [place] controlled substance database is checked at least twice yearly and the database results was checked 07/06/19 and is appropriate.   The last UDS was on 10/2017 and results showed additional pain meds. F/u UDS delayed due to COVID. Patient needs a yearly UDS.  Pain contract was updated and signed this visit. Through the pain contract, GOVERNOR MATOS describes commitment to avoid excessive opioid use and only relying on pain medications to pursue goal of continuing to be at home and stay out of the hospital.  The patient is on tramadol 100mg  BID, #120 per 30 days. Adjunctive treatment includes alcohol abstinence. This regimen allows CECILE GUEVARA to function without excessive sedation or side effects. The benefits of continuing opioid therapy outweigh the risks and chronic opioids will be continued. Ongoing education about safe opioid treatment is provided.    - C/w current regimen: tramadol 100mg  BID - F/u in 3 months  Lung nodule Repeat scan showing resolution of left sided nodules both upper and lower lobes. Right upper lobe nodule stable. Dr.Sood recommend no need for additional imaging. Dr.Perrault expressed understanding.    Patient discussed with Dr.Guilloud   -Gilberto Better, PGY2 [facility] Internal Medicine Pager: [phone]

## 2019-07-06 NOTE — Patient Instructions (Signed)
Thank you for allowing [place] to provide your care today. Today we discussed your shortness of breath    I have ordered Chest X-ray labs for you. I will call if any are abnormal.    Today we made no changes to your medications.    Please follow-up in 3 months.    Should you have any questions or concerns please call the internal medicine clinic at [phone].     Chronic Obstructive Pulmonary Disease Chronic obstructive pulmonary disease (COPD) is a long-term (chronic) lung problem. When you have COPD, it is hard for air to get in and out of your lungs. Usually the condition gets worse over time, and your lungs will never return to normal. There are things you can do to keep yourself as healthy as possible.  Your doctor may treat your condition with: ? Medicines. ? Oxygen. ? Lung surgery.  Your doctor may also recommend: ? Rehabilitation. This includes steps to make your body work better. It may involve a team of specialists. ? Quitting smoking, if you smoke. ? Exercise and changes to your diet. ? Comfort measures (palliative care). Follow these instructions at home: Medicines  Take over-the-counter and prescription medicines only as told by your doctor.  Talk to your doctor before taking any cough or allergy medicines. You may need to avoid medicines that cause your lungs to be dry. Lifestyle  If you smoke, stop. Smoking makes the problem worse. If you need help quitting, ask your doctor.  Avoid being around things that make your breathing worse. This may include smoke, chemicals, and fumes.  Stay active, but remember to rest as well.  Learn and use tips on how to relax.  Make sure you get enough sleep. Most adults need at least 7 hours of sleep every night.  Eat healthy foods. Eat smaller meals more often. Rest before meals. Controlled breathing Learn and use tips on how to control your breathing as told by your doctor. Try:  Breathing in (inhaling) through your nose for  1 second. Then, pucker your lips and breath out (exhale) through your lips for 2 seconds.  Putting one hand on your belly (abdomen). Breathe in slowly through your nose for 1 second. Your hand on your belly should move out. Pucker your lips and breathe out slowly through your lips. Your hand on your belly should move in as you breathe out.  Controlled coughing Learn and use controlled coughing to clear mucus from your lungs. Follow these steps: 1. Lean your head a little forward. 2. Breathe in deeply. 3. Try to hold your breath for 3 seconds. 4. Keep your mouth slightly open while coughing 2 times. 5. Spit any mucus out into a tissue. 6. Rest and do the steps again 1 or 2 times as needed. General instructions  Make sure you get all the shots (vaccines) that your doctor recommends. Ask your doctor about a flu shot and a pneumonia shot.  Use oxygen therapy and pulmonary rehabilitation if told by your doctor. If you need home oxygen therapy, ask your doctor if you should buy a tool to measure your oxygen level (oximeter).  Make a COPD action plan with your doctor. This helps you to know what to do if you feel worse than usual.  Manage any other conditions you have as told by your doctor.  Avoid going outside when it is very hot, cold, or humid.  Avoid people who have a sickness you can catch (contagious).  Keep all follow-up visits  as told by your doctor. This is important. Contact a doctor if:  You cough up more mucus than usual.  There is a change in the color or thickness of the mucus.  It is harder to breathe than usual.  Your breathing is faster than usual.  You have trouble sleeping.  You need to use your medicines more often than usual.  You have trouble doing your normal activities such as getting dressed or walking around the house. Get help right away if:  You have shortness of breath while resting.  You have shortness of breath that stops you from: ? Being able  to talk. ? Doing normal activities.  Your chest hurts for longer than 5 minutes.  Your skin color is more blue than usual.  Your pulse oximeter shows that you have low oxygen for longer than 5 minutes.  You have a fever.  You feel too tired to breathe normally. Summary  Chronic obstructive pulmonary disease (COPD) is a long-term lung problem.  The way your lungs work will never return to normal. Usually the condition gets worse over time. There are things you can do to keep yourself as healthy as possible.  Take over-the-counter and prescription medicines only as told by your doctor.  If you smoke, stop. Smoking makes the problem worse. This information is not intended to replace advice given to you by your health care provider. Make sure you discuss any questions you have with your health care provider. Document Revised: 05/23/2017 Document Reviewed: 07/15/2016 Elsevier Patient Education  2020 [employer].

## 2019-07-08 NOTE — Progress Notes (Signed)
Internal Medicine Clinic Attending ° °Case discussed with Dr. Lee at the time of the visit.  We reviewed the resident’s history and exam and pertinent patient test results.  I agree with the assessment, diagnosis, and plan of care documented in the resident’s note.  °

## 2019-07-10 LAB — TOXASSURE SELECT,+ANTIDEPR,UR

## 2019-07-16 ENCOUNTER — Encounter: Payer: Medicaid Other | Admitting: Thoracic Surgery (Cardiothoracic Vascular Surgery)

## 2019-07-16 ENCOUNTER — Encounter: Payer: Self-pay | Admitting: Thoracic Surgery (Cardiothoracic Vascular Surgery)

## 2019-07-16 ENCOUNTER — Other Ambulatory Visit: Payer: Self-pay

## 2019-07-16 VITALS — BP 124/81 | HR 100 | Temp 97.9°F | Resp 18 | Ht 68.0 in | Wt 130.0 lb

## 2019-07-19 DIAGNOSIS — L400 Psoriasis vulgaris: Secondary | ICD-10-CM | POA: Diagnosis not present

## 2019-07-20 DIAGNOSIS — L400 Psoriasis vulgaris: Secondary | ICD-10-CM | POA: Diagnosis not present

## 2019-07-22 NOTE — Telephone Encounter (Signed)
Called and spoke with [name] regarding no acute findings on Chest X-ray

## 2019-07-29 ENCOUNTER — Telehealth: Payer: Self-pay | Admitting: Pulmonary Disease

## 2019-07-29 NOTE — Progress Notes (Signed)
Virtual Visit via Telephone Note  I connected with [name] on 07/30/19 at  9:00 AM EST by telephone and verified that I am speaking with the correct person using two identifiers.  Location: Patient: Home Provider: Office [facility] - [address] [place], [address], [place], [place] [address]   I discussed the limitations, risks, security and privacy concerns of performing an evaluation and management service by telephone and the availability of in person appointments. I also discussed with the patient that there may be a patient responsible charge related to this service. The patient expressed understanding and agreed to proceed.  Patient consented to consult via telephone: Yes People present and their role in pt care: Pt   History of Present Illness:  59 year old male former smoker followed in our office for COPD and history of lung nodules  Past medical history: Chronic pancreatitis, Depression, ETOH, HLD, Psoriasis, PUD, Rt PTX Smoking history: Former smoker. Quit 2019. 60 pack year smoking.  Maintenance: Pulmicort nebs, Performist nebs, Yupelri Patient of Dr. Craige Cotta  Chief complaint: Follow Up - last seen 03/18/19 by Dr. Craige Cotta   59 year old male former smoker followed in our office for severe bullous emphysema/COPD.  He was last seen by Dr. Craige Cotta in September/2020.  At last follow-up in September/2020 he was encouraged to continue his triple therapy maintenance nebulized meds.  He was referred to [facility] to see if he would be a candidate for bullectomy.  Patient reports that he completed an appointment with Dr. Cliffton Asters in January/20 07/2019.  I am unable to see the complete note from Dr. Cliffton Asters before what the patient is requesting Dr. Cliffton Asters suggested the patient be referred to a transplant center for potential lung transplant.  Per the patient Dr. Cliffton Asters suggested [facility].  Per the patient Dr. Cliffton Asters suggested that our office should handle the coordination of  this.  Patient also reporting that he needs a new nebulizer.  His DME company is [employer].  Patient denies any other acute concerns at this time.  Observations/Objective:  Pulmonary tests:  PFT 07/14/17 >> FEV1 1.69 (48%), FEV1% 41, TLC 7.09 (107%), RV 3.41 (168%), DLCO 32% A1AT 05/27/18 >> 192, MM ONO with RA 06/09/18 >> test time 8 hrs 59 min.  Baseline SpO2 95%, low SpO2 90%  Chest imaging:  CT angio chest 05/22/18 >> severe emphysema, moderate bullous area Rt upper lung, 2 mm nodule RLL, 7 mm nodule in lingula, 21 mm nodule LLL CT chest 09/09/18 >> bullous emphysema, mod/severe centrilobular emphysema, diffuse bronchial wall thickening, resolved nodule LLL, new 6 mm solid nodule LUL, new bandlike consolidation RUP CT chest 03/10/19 >> LUL nodule resolved, stable 3 mm nodule RUL  Cardiac tests:  Echo 10/11/16 >> EF 69%, grade 1 DD   Social History   Tobacco Use  Smoking Status Former Smoker  . Packs/day: 1.50  . Years: 40.00  . Pack years: 60.00  . Types: Cigarettes  . Quit date: 05/19/2018  . Years since quitting: 1.1  Smokeless Tobacco Never Used   Immunization History  Administered Date(s) Administered  . Influenza Whole 02/20/2010  . Influenza,inj,Quad PF,6+ Mos 04/01/2017, 06/09/2018, 03/18/2019  . Influenza-Unspecified 03/28/2017  . Tdap 07/27/2009    Assessment and Plan:  COPD with emphysema (HCC) Plan: New nebulizer order today Continue Yupelri, performance, Pulmicort nebs Referral to [facility] pulmonary/transplant team Follow-up with our office in 3 months Call with any questions or concerns    Follow Up Instructions:  Return in about 3 months (  around 10/27/2019), or if symptoms worsen or fail to improve, for Follow up with Dr. Halford Chessman.   I discussed the assessment and treatment plan with the patient. The patient was provided an opportunity to ask questions and all were answered. The patient agreed with the plan and demonstrated an understanding of the  instructions.   The patient was advised to call back or seek an in-person evaluation if the symptoms worsen or if the condition fails to improve as anticipated.  I provided 24 minutes of non-face-to-face time during this encounter.   [name], NP

## 2019-07-29 NOTE — Telephone Encounter (Signed)
Pt last seen at office 03/18/19. Pt is due for a 4-5 month follow up per last OV with VS. Called and spoke with pt to see if I could schedule him an appt and pt stated that was fine. Pt has been scheduled a televisit with Arlys John tomorrow 2/5 at 9am. Made a comment that pt also needs new neb machine. Nothing further needed.

## 2019-07-30 ENCOUNTER — Other Ambulatory Visit: Payer: Self-pay

## 2019-07-30 ENCOUNTER — Encounter: Payer: Self-pay | Admitting: Pulmonary Disease

## 2019-07-30 ENCOUNTER — Ambulatory Visit (INDEPENDENT_AMBULATORY_CARE_PROVIDER_SITE_OTHER): Payer: Medicaid Other | Admitting: Pulmonary Disease

## 2019-07-30 DIAGNOSIS — J430 Unilateral pulmonary emphysema [MacLeod's syndrome]: Secondary | ICD-10-CM | POA: Diagnosis not present

## 2019-07-30 DIAGNOSIS — K860 Alcohol-induced chronic pancreatitis: Secondary | ICD-10-CM

## 2019-07-30 MED ORDER — TRAMADOL HCL 50 MG PO TABS: 100.0000 mg | ORAL_TABLET | Freq: Two times a day (BID) | ORAL | 3 refills | Status: DC

## 2019-07-30 NOTE — Assessment & Plan Note (Addendum)
Plan: New nebulizer order today Continue Yupelri, performance, Pulmicort nebs Referral to [facility] pulmonary/transplant team Follow-up with our office in 3 months Call with any questions or concerns

## 2019-07-30 NOTE — Telephone Encounter (Signed)
Received TC from patient, states he called yesterday for a refill on Tramadol and was checking on RX refill request.  No refill request noted in chart. Informed pt RN will submit refill request today.  Pt states his pharmacy is not open on weekend and would like Rx today if possible.  Thank you, SChaplin, RN,BSN

## 2019-07-30 NOTE — Telephone Encounter (Signed)
Pls sch PCP appt mid April or early May

## 2019-07-30 NOTE — Progress Notes (Signed)
Reviewed and agree with assessment/plan.   Merrianne Mccumbers, MD [facility] 06/19/2016, 12:24 PM Pager:  [phone]  

## 2019-07-30 NOTE — Patient Instructions (Signed)
You were seen today by Coral Ceo, NP  for:  1. Unilateral emphysema (HCC)  - Ambulatory Referral for DME - Ambulatory referral to Pulmonology  Continue Yupelri nebulized meds daily Continue Perforomist nebulized meds twice daily Continue Pulmicort nebulized meds twice daily  Referral to your DME company for new nebulizer placed today  Contact our office with any questions or concerns  Based off our conversation and the stated recommendations from [facility] Dr. Lucilla Lame office I will refer you to [facility] pulmonary/transplant team  We recommend today:  Orders Placed This Encounter  Procedures  . Ambulatory Referral for DME    Referral Priority:   Routine    Referral Type:   Durable Medical Equipment Purchase    Number of Visits Requested:   1  . Ambulatory referral to Pulmonology    Referral Priority:   Routine    Referral Type:   Consultation    Referral Reason:   Specialty Services Required    Requested Specialty:   Pulmonary Disease    Number of Visits Requested:   1   Orders Placed This Encounter  Procedures  . Ambulatory Referral for DME  . Ambulatory referral to Pulmonology   No orders of the defined types were placed in this encounter.   Follow Up:    Return in about 3 months (around 10/27/2019), or if symptoms worsen or fail to improve, for Follow up with Dr. Craige Cotta.   Please do your part to reduce the spread of COVID-19:      Reduce your risk of any infection  and COVID19 by using the similar precautions used for avoiding the common cold or flu:  Marland Kitchen Wash your hands often with soap and warm water for at least 20 seconds.  If soap and water are not readily available, use an alcohol-based hand sanitizer with at least 60% alcohol.  . If coughing or sneezing, cover your mouth and nose by coughing or sneezing into the elbow areas of your shirt or coat, into a tissue or into your sleeve (not your hands). Drinda Butts A MASK when in public  . Avoid shaking hands with  others and consider head nods or verbal greetings only. . Avoid touching your eyes, nose, or mouth with unwashed hands.  . Avoid close contact with people who are sick. . Avoid places or events with large numbers of people in one location, like concerts or sporting events. . If you have some symptoms but not all symptoms, continue to monitor at home and seek medical attention if your symptoms worsen. . If you are having a medical emergency, call 911.   ADDITIONAL HEALTHCARE OPTIONS FOR PATIENTS  [place] [facility] / e-Visit: [email or web address]         [facility]: [phone]  [facility]: [phone]                   [facility]: [phone]     It is flu season:   >>> Best ways to protect herself from the flu: Receive the yearly flu vaccine, practice good hand hygiene washing with soap and also using hand sanitizer when available, eat a nutritious meals, get adequate rest, hydrate appropriately   Please contact the office if your symptoms worsen or you have concerns that you are not improving.   Thank you for choosing [facility] for your healthcare, and for allowing [place] to partner with you on your healthcare journey. I am thankful to be  able to provide care to you today.   Wyn Quaker FNP-C

## 2019-08-02 ENCOUNTER — Telehealth: Payer: Self-pay | Admitting: *Deleted

## 2019-08-02 ENCOUNTER — Telehealth: Payer: Self-pay | Admitting: Internal Medicine

## 2019-08-02 NOTE — Telephone Encounter (Signed)
Refill Request-pt states her needs a Prior Authorization  In order to get his Medication  traMADol (ULTRAM) 50 MG tablet  [facility] - [place], [place] - [address]

## 2019-08-02 NOTE — Telephone Encounter (Addendum)
Information was faxed to [employer] for PA for Tramadol.  Awaiting determination.  Angelina Ok, RN 08/02/2019 4:47 PM. Call to Lillie Columbia PA for Tramadol approved 08/03/2019 -01/30/2020. I [id number].  Angelina Ok, RN 08/04/2019 9:25 AM.

## 2019-08-03 NOTE — Telephone Encounter (Signed)
Pt is calling checking on his medicine [phone]

## 2019-08-03 NOTE — Telephone Encounter (Signed)
Spoke with the patient.  Pt sch an appt for 11/09/2019 with his PCP.

## 2019-08-12 ENCOUNTER — Other Ambulatory Visit: Payer: Self-pay | Admitting: Pulmonary Disease

## 2019-08-25 ENCOUNTER — Other Ambulatory Visit: Payer: Self-pay | Admitting: Internal Medicine

## 2019-08-25 DIAGNOSIS — F3289 Other specified depressive episodes: Secondary | ICD-10-CM

## 2019-08-26 ENCOUNTER — Telehealth: Payer: Self-pay | Admitting: Internal Medicine

## 2019-08-26 DIAGNOSIS — F329 Major depressive disorder, single episode, unspecified: Secondary | ICD-10-CM | POA: Insufficient documentation

## 2019-08-26 DIAGNOSIS — F32A Depression, unspecified: Secondary | ICD-10-CM | POA: Insufficient documentation

## 2019-08-26 NOTE — Telephone Encounter (Signed)
Called and spoke with Mr.Diegel regarding his amitriptyline dose. He mentions he takes 2 pills at night. Occasionally taking 3 pills. Discussed option of changing the formulation to 25mg  daily instead of 2x10mg . Mr.Ruffolo agreed with the change. Script with new dosage sent to pharmacy.

## 2019-08-31 ENCOUNTER — Other Ambulatory Visit: Payer: Self-pay | Admitting: Pulmonary Disease

## 2019-09-23 ENCOUNTER — Telehealth: Payer: Self-pay | Admitting: Pulmonary Disease

## 2019-09-23 NOTE — Telephone Encounter (Signed)
09/23/2019  Received letter from [facility] regarding patient's referral to the transplant team.  It reports that the "patient asked to have the referral closed without evaluation because he does not have the social support or financial ability to meet the requirements of the program.  He does not want to pursue transplant at this time.  He is aware that if his situation should change we will be happy to review the records and verify eligibility to reopen his referral."  We will route message to Dr. Gigi Gin as he is the patient's primary pulmonologist.  We will also route the message to Dr. Cliffton Asters with [facility] to the patient is also seen as FYI.  Elisha Headland FNP

## 2019-09-27 NOTE — Telephone Encounter (Signed)
Noted.  Coralyn Helling, MD [facility]monary/Critical Care 09/27/2019, 7:38 AM

## 2019-10-07 ENCOUNTER — Other Ambulatory Visit: Payer: Self-pay | Admitting: Pulmonary Disease

## 2019-10-15 DIAGNOSIS — L400 Psoriasis vulgaris: Secondary | ICD-10-CM | POA: Diagnosis not present

## 2019-10-18 DIAGNOSIS — L400 Psoriasis vulgaris: Secondary | ICD-10-CM | POA: Diagnosis not present

## 2019-11-09 ENCOUNTER — Encounter: Payer: Self-pay | Admitting: Internal Medicine

## 2019-11-09 ENCOUNTER — Ambulatory Visit: Payer: Medicaid Other | Admitting: Internal Medicine

## 2019-11-09 VITALS — BP 103/73 | HR 108 | Temp 98.3°F | Ht 68.0 in | Wt 127.7 lb

## 2019-11-09 DIAGNOSIS — K2980 Duodenitis without bleeding: Secondary | ICD-10-CM | POA: Diagnosis not present

## 2019-11-09 DIAGNOSIS — F10929 Alcohol use, unspecified with intoxication, unspecified: Secondary | ICD-10-CM

## 2019-11-09 DIAGNOSIS — F329 Major depressive disorder, single episode, unspecified: Secondary | ICD-10-CM | POA: Diagnosis not present

## 2019-11-09 DIAGNOSIS — Z7189 Other specified counseling: Secondary | ICD-10-CM | POA: Diagnosis not present

## 2019-11-09 DIAGNOSIS — E785 Hyperlipidemia, unspecified: Secondary | ICD-10-CM | POA: Diagnosis not present

## 2019-11-09 DIAGNOSIS — J430 Unilateral pulmonary emphysema [MacLeod's syndrome]: Secondary | ICD-10-CM | POA: Diagnosis not present

## 2019-11-09 DIAGNOSIS — F32A Depression, unspecified: Secondary | ICD-10-CM

## 2019-11-09 DIAGNOSIS — K860 Alcohol-induced chronic pancreatitis: Secondary | ICD-10-CM | POA: Diagnosis not present

## 2019-11-09 MED ORDER — TRAMADOL HCL 50 MG PO TABS: 100.0000 mg | ORAL_TABLET | Freq: Three times a day (TID) | ORAL | 0 refills | Status: DC | PRN

## 2019-11-09 NOTE — ACP (Advance Care Planning) (Signed)
Patient requests to be DNR. Intubation for short term procedures only. DNR form signed and scanned in Media section.

## 2019-11-09 NOTE — Progress Notes (Signed)
CC: Shortness of breath  HPI: [name] [name] is a 59 y.o. with PMH listed below presenting with complaint of shortness of breath. Please see problem based assessment and plan for further details.  Past Medical History:  Diagnosis Date  . Chronic pancreatitis (HCC)   . Depression   . Duodenitis    by EGD 1/07  . Emphysema    unclear diagnosis noted in previous EMR  . History of alcohol abuse   . Hyperlipidemia   . Psoriasis    extensive, responds to strong steroids, unable to start molecular therapies due to cost  . RUE weakness    2/2 cervical spondylosis  . Stomach ulcer     Review of Systems: Review of Systems  Constitutional: Positive for malaise/fatigue and weight loss. Negative for chills and fever.  Eyes: Negative for blurred vision.  Respiratory: Positive for cough and shortness of breath. Negative for sputum production.   Cardiovascular: Positive for chest pain. Negative for palpitations and leg swelling.  Gastrointestinal: Positive for abdominal pain. Negative for blood in stool, constipation, diarrhea, nausea and vomiting.  Neurological: Negative for dizziness and headaches.  All other systems reviewed and are negative.   Physical Exam: Vitals:   11/09/19 1421  BP: 103/73  Pulse: (!) 108  Temp: 98.3 F (36.8 C)  TempSrc: Oral  SpO2: 100%  Weight: 127 lb 11.2 oz (57.9 kg)  Height: 5\' 8"  (1.727 m)    Physical Exam  Constitutional: [name] is oriented to person, place, and time. [name] appears well-developed. No distress.  Cachetic appearing  HENT:  Mouth/Throat: Oropharynx is clear and moist.  Eyes: Conjunctivae are normal.  Neck: No JVD present.  Cardiovascular: Regular rhythm, normal heart sounds and intact distal pulses.  No murmur heard. Tachycardic  Respiratory: Effort normal. [name] has no wheezes.  Distant breath sounds. Hyperresonant to percussion.  GI: Soft. Bowel sounds are normal. [name] exhibits no distension. There is no abdominal tenderness.    Musculoskeletal:        General: No edema. Normal range of motion.  Neurological: [name] is alert and oriented to person, place, and time.  Skin: Skin is warm and dry.  Psychiatric: [name] has a normal mood and affect.    Assessment & Plan:   Dyslipidemia Lab Results  Component Value Date   CHOL 126 12/19/2016   HDL 45 12/19/2016   LDLCALC 29 12/19/2016   TRIG 260 (H) 12/19/2016   CHOLHDL 2.8 12/19/2016   Previously on atorvastatin 40mg  daily for secondary prevention and finding of coronary atherosclerosis. However patient has also stopped taking it about 6 months ago. Agree that [name] will get limited benefit from atorvastatin considering extent of his emphysema.  - D/c atorvastatin 40mg  daily   COPD with emphysema (HCC) Currently on Yupelri, Performance, Pulmicort nebs. Sees Dr.Sood. Decided to turn down referral for transplant due to cost and lack of social support. [name] states that [name] does not want to be a burden to his brother and take him away from his responsibilities and his mother, who is 9, is too old to care for him as [name] undergoes transplant evaluation and procedure. [name] continues to use his nebulizers as prescribed but mentions continuing to endorse significant air hunger and feeling of chronic dyspnea. However is O2 sat is 100% on room air this visit. [name] states that [name] was previously informed that CT surgeon had recommended evaluation for transplant prior to proceeding with LVR surgery  - C/w nebulizers per pulm - Increase tramadol frequency  to address air hunger and worsening chest pain - F/u with CT surg for possible lung volume reduction surgery  Chronic pancreatitis due to acute alcohol intoxication ([name] [name]) [name] [name] presents f/u management for chronic pain. [name] states that over the last 3 months [name] has had worsening chest pain and air hunger in addition to his chronic pancreatic pain. [name] mentions significant chest pain on L side underneath the diaphragm as well as feelings of  dyspnea and anxiety that keeps him up at night. [name] mentions that [name] feels well with tramadol during the morning and evening time but has difficulty managing his sxs during lunch time.  [name] [name] is on chronic opioid therapy for  chronic pain from chronic pancreatitis, chronic chest pain from frequent coughs and air hunger. As part of the treatment plan, the [place] controlled substance database is checked at least twice yearly and the database results was checked 11/09/19 and is appropriate.   The last UDS was on 07/06/19 and results were as expected. Patient needs a yearly UDS.  Pain contract was updated and signed on 07/06/19. Through the pain contract, [name] [name] describes commitment to avoid excessive opioid use and only relying on pain medications to pursue goal of performing ADLs without discomfort at home and preventing hospital admission.  The patient is currently on tramadol 100mg  BID, 120 per 30 days. Adjunctive treatment includes amitriptyline. This regimen allows [name] [name] to function without excessive sedation or side effects. The benefits of continuing opioid therapy outweigh the risks and chronic opioids will be continued. Ongoing education about safe opioid treatment is provided.    - Will increase frequency to tramadol 100mg  TID #180 per 30 days to control his symptoms with his worsening medical condition. - F/u in 3 months  Goals of care, counseling/discussion [name] [name] is a 59 yo M w/ PMH of COPD w/ sever emphysema, HLD, chronic pancreatitis on chronic opioids and depression presenting to [facility] for management of his chronic conditions. Since his last visit, [name] was contacted by transplant team at [facility] and [name] decided to forego evaluation for transplant due to lack of financial and social support. [name] states that at this point, [name] understands that [name] has limited life expectancy due to his pulmonary disease. [name] understands that [name] may benefit from his lung volume reduction surgery  but [name] states that if 'it's his time to go, [name] wants to go.' [name] recalls seeing his father's hospitalization and not wanting his mother to go through similar prolonged hospitalization if [name] were to decompensate. [name] mentions that in the event of cardiac arrest, [name] would not want to be resuscitated and [name] would only want to be intubated for short-term procedures. [name] mentions that [name] has living will set up with his mother. [name] is agreeable to signing DNR order with the understanding that [name] can change his mind at any point.   A/P Presents for f/u management of his chronic conditions. Unfortunately rejected transplant evaluation due to financial constraints. Still may benefit from lung volume reduction surgery but overall long-term prognosis is poor due to extent of his emphysema. Will likely benefit from palliative consult in the near future.  - C/w goals of care discussion - DNR signed  Depression Presents for f/u for depression. Mentions depressed mood associated with prospect of limited life expectancy due to his pulmonary disease but overall [name] feels well. Denies any Si/HI at this time.   - C/w amitriptyline 25mg  daily qhs   Patient discussed with Dr. Lynnae January   -  [name] [name], PGY2 [facility] Internal Medicine Pager: [phone]

## 2019-11-09 NOTE — Assessment & Plan Note (Signed)
[name] presents f/u management for chronic pain. He states that over the last 3 months he has had worsening chest pain and air hunger in addition to his chronic pancreatic pain. He mentions significant chest pain on L side underneath the diaphragm as well as feelings of dyspnea and anxiety that keeps him up at night. He mentions that he feels well with tramadol during the morning and evening time but has difficulty managing his sxs during lunch time.  [name] is on chronic opioid therapy for  chronic pain from chronic pancreatitis, chronic chest pain from frequent coughs and air hunger. As part of the treatment plan, the [place] controlled substance database is checked at least twice yearly and the database results was checked 11/09/19 and is appropriate.   The last UDS was on 07/06/19 and results were as expected. Patient needs a yearly UDS.  Pain contract was updated and signed on 07/06/19. Through the pain contract, [name] describes commitment to avoid excessive opioid use and only relying on pain medications to pursue goal of performing ADLs without discomfort at home and preventing hospital admission.  The patient is currently on tramadol 100mg  BID, 120 per 30 days. Adjunctive treatment includes amitriptyline. This regimen allows [name] to function without excessive sedation or side effects. The benefits of continuing opioid therapy outweigh the risks and chronic opioids will be continued. Ongoing education about safe opioid treatment is provided.    - Will increase frequency to tramadol 100mg  TID #180 per 30 days to control his symptoms with his worsening medical condition. - F/u in 3 months

## 2019-11-09 NOTE — Patient Instructions (Addendum)
Thank you for allowing [place] to provide your care today. Today we discussed your pain and shortness of breath    I have ordered no labs for you. I will call if any are abnormal.    Today we made the following changes to your medications.    Please take tramadol 100mg  3 times daily as needed  Please follow-up in 3 months.    Should you have any questions or concerns please call the internal medicine clinic at [phone].    Pancrelipase capsules What is this medicine? PANCRELIPASE (pan cre LI pase) helps to improve digestion of food by replacing digestive enzymes. This medicine is used to treat health conditions that cause your body to produce less of these enzymes. This medicine may be used for other purposes; ask your health care provider or pharmacist if you have questions. COMMON BRAND NAME(S): Cotazym S, Creon, Creon 10, Creon 20, Creon 5, Dygase, Ku-Zyme, Ku-Zyme HP, Kutrase, Lapase, Lipram, Lipram-CR, Lipram-PN, Lipram-UL, Palcaps, Pancrease, Pancrease MT, Pancreaze, Pancrecarb MS, Pancron, Pangestyme CN, Pangestyme EC, Pangestyme MT, Pangestyme UL, Panocaps, Panocaps MT, Pertzye, Ultracaps MT, Ultrase, Ultrase MT, Rock[place], Zenpep What should I tell my health care provider before I take this medicine? They need to know if you have any of these conditions:  a history of intestinal blockage or a condition called 'fibrosing colonopathy'  abnormally high uric acid in the blood  diabetes  gout  kidney disease  trouble swallowing capsules  an unusual or allergic reaction to pancrelipase, pancreatin, pork, pork protein, other medicines, foods, dyes, or preservatives  pregnant or trying to get pregnant  breast-feeding How should I use this medicine? Take this medicine by mouth with a glass of water. Follow the directions on the prescription label. Take with food. Do not crush or chew the contents of the capsule. If you or your child have trouble swallowing, you may open the capsule  and sprinkle the contents on soft foods that do not require chewing such as applesauce, pureed bananas, or pears. Swallow the mixture right away followed with water or juice. Do not store the mixture. If you are giving this medicine to an infant, you may sprinkle the contents directly into your child's mouth. Give the medicine right before each feeding of formula or breast milk. Do not mix capsule contents directly into formula or breast milk. Make sure the medicine is swallowed completely and that no medicine is left in the mouth. Take your doses at regular intervals. Do not take your medicine more often than directed. A special MedGuide will be given to you by the pharmacist with each prescription and refill of delayed-release capsules (Creon, Zenpep, or Pancreaze). Be sure to read this information carefully each time. Talk to your pediatrician regarding the use of this medicine in children. While this medicine may be prescribed for children for selected conditions precautions do apply. Overdosage: If you think you have taken too much of this medicine contact a poison control center or emergency room at once. NOTE: This medicine is only for you. Do not share this medicine with others. What if I miss a dose? If you miss a dose, take it as soon as you can. If it is almost time for your next dose, take only that dose. Do not take double or extra doses. What may interact with this medicine?  acarbose  antacids containing calcium or magnesium  iron  miglitol This list may not describe all possible interactions. Give your health care provider a list of all  the medicines, herbs, non-prescription drugs, or dietary supplements you use. Also tell them if you smoke, drink alcohol, or use illegal drugs. Some items may interact with your medicine. What should I watch for while using this medicine? Visit your doctor for regular check ups. Talk to your doctor before you change brands of this medicine. Each  brand has different amounts of enzymes. You may need to be on a special diet while taking this medicine. Also, ask your doctor how much water you need to drink. This medicine may increase your chance of having a rare bowel disorder. The risk of having this condition may be reduced by following the dosing directions that your healthcare professional gives you. Call your healthcare professional right away if you have any unusual or severe stomach pain. This medicine may increase blood uric acid levels, for example, worsening of gout, or painful, swollen joints. Call your healthcare professional right away if you have any of these symptoms. Be careful if you open the capsule. This medicine can irritate the lungs if you breathe it in. Also, do not hold the medicine in your mouth or chew it. This may cause mouth sores. This medicine may affect blood sugar levels. If you have diabetes, check with your doctor or health care professional before you change your diet or the dose of your diabetic medicine. Women should inform their doctor if they wish to become pregnant or think they might be pregnant. What side effects may I notice from receiving this medicine? Side effects that you should report to your doctor or health care professional as soon as possible:  allergic reactions like skin rash, itching or hives, swelling of the face, lips, or tongue  fever or chills, sore throat  severe stomach pain or bloating  shortness of breath  skin rash  trouble passing stool  vomiting Side effects that usually do not require medical attention (report to your doctor or health care professional if they continue or are bothersome):  constipation or diarrhea  cough  dizziness  headache  nausea  stomach gas  stomach pain  weight loss This list may not describe all possible side effects. Call your doctor for medical advice about side effects. You may report side effects to FDA at 1-800-FDA-1088. Where  should I keep my medicine? Keep out of the reach of children. Store at room temperature between 15 and 25 degrees C (59 and 77 degrees F). Do not refrigerate. Protect from moisture. Throw away any unused medicine after the expiration date. NOTE: This sheet is a summary. It may not cover all possible information. If you have questions about this medicine, talk to your doctor, pharmacist, or health care provider.  2020 Elsevier/Gold Standard (2011-10-09 10:16:32)

## 2019-11-09 NOTE — Assessment & Plan Note (Addendum)
[name] is a 59 yo M w/ PMH of COPD w/ sever emphysema, HLD, chronic pancreatitis on chronic opioids and depression presenting to [facility] for management of his chronic conditions. Since his last visit, he was contacted by transplant team at [facility] and he decided to forego evaluation for transplant due to lack of financial and social support. He states that at this point, he understands that he has limited life expectancy due to his pulmonary disease. He understands that he may benefit from his lung volume reduction surgery but he states that if 'it's his time to go, he wants to go.' He recalls seeing his father's hospitalization and not wanting his mother to go through similar prolonged hospitalization if he were to decompensate. He mentions that in the event of cardiac arrest, he would not want to be resuscitated and he would only want to be intubated for short-term procedures. He mentions that he has living will set up with his mother. He is agreeable to signing DNR order with the understanding that he can change his mind at any point.   A/P Presents for f/u management of his chronic conditions. Unfortunately rejected transplant evaluation due to financial constraints. Still may benefit from lung volume reduction surgery but overall long-term prognosis is poor due to extent of his emphysema. Will likely benefit from palliative consult in the near future.  - C/w goals of care discussion - DNR signed

## 2019-11-09 NOTE — Assessment & Plan Note (Addendum)
Currently on Yupelri, Performance, Pulmicort nebs. Sees Dr.Sood. Decided to turn down referral for transplant due to cost and lack of social support. He states that he does not want to be a burden to his brother and take him away from his responsibilities and his mother, who is 31, is too old to care for him as he undergoes transplant evaluation and procedure. He continues to use his nebulizers as prescribed but mentions continuing to endorse significant air hunger and feeling of chronic dyspnea. However is O2 sat is 100% on room air this visit. He states that he was previously informed that CT surgeon had recommended evaluation for transplant prior to proceeding with LVR surgery  - C/w nebulizers per pulm - Increase tramadol frequency to address air hunger and worsening chest pain - F/u with CT surg for possible lung volume reduction surgery

## 2019-11-09 NOTE — Assessment & Plan Note (Signed)
Presents for f/u for depression. Mentions depressed mood associated with prospect of limited life expectancy due to his pulmonary disease but overall he feels well. Denies any Si/HI at this time.   - C/w amitriptyline 25mg  daily qhs

## 2019-11-09 NOTE — Assessment & Plan Note (Addendum)
Lab Results  Component Value Date   CHOL 126 12/19/2016   HDL 45 12/19/2016   LDLCALC 29 12/19/2016   TRIG 260 (H) 12/19/2016   CHOLHDL 2.8 12/19/2016   Previously on atorvastatin 40mg  daily for secondary prevention and finding of coronary atherosclerosis. However patient has also stopped taking it about 6 months ago. Agree that he will get limited benefit from atorvastatin considering extent of his emphysema.  - D/c atorvastatin 40mg  daily

## 2019-11-10 NOTE — Progress Notes (Signed)
Internal Medicine Clinic Attending ° °Case discussed with Dr. Lee at the time of the visit.  We reviewed the resident’s history and exam and pertinent patient test results.  I agree with the assessment, diagnosis, and plan of care documented in the resident’s note.  °

## 2019-11-16 ENCOUNTER — Telehealth: Payer: Self-pay | Admitting: Pulmonary Disease

## 2019-11-16 NOTE — Telephone Encounter (Signed)
PA request received from [facility] requested: Perforomist CMM Key: n/a- initiated via [employer] (pt has Medicaid)  Tried/failed: Yupelri, pulmicort, dulera, spiriva HH, incruse.  Covered alternatives:  PA request has been sent to plan, and a determination is expected within 1 business day.     PA #: [id number]  Will call back 11/17/2019 to follow up on status of PA>

## 2019-11-18 NOTE — Telephone Encounter (Signed)
ATC [employer] at [phone]. They are not currently open, message will need to be followed up by triage.

## 2019-11-19 NOTE — Telephone Encounter (Signed)
Triage - please follow up on this PA. 

## 2019-11-19 NOTE — Telephone Encounter (Signed)
Called [place] [employer] and spoke with Seward Grater to check status of PA.  Per Seward Grater, [place] for Perforomist has been approved from 11/16/19-11/10/20.   Called pt's pharmacy and spoke with Harriett Sine letting her know that the PA had been approved and she verbalized understanding. Nothing further needed.

## 2019-11-24 ENCOUNTER — Telehealth: Payer: Self-pay

## 2019-11-24 NOTE — Telephone Encounter (Signed)
Thank you for speaking with him.

## 2019-11-24 NOTE — Telephone Encounter (Signed)
traMADol (ULTRAM) 50 MG tablet   Refill request @  [facility] at [facility], [place] - [address] [phone] (Phone) [phone] (Fax)

## 2019-11-24 NOTE — Telephone Encounter (Signed)
Patient notified that Tramadol was sent on 11/09/2019 to [facility] with a start date of 11/28/2019. He will call back if there is any issue filling this. He was very Adult nurse. Kinnie Feil, BSN, RN-BC

## 2019-12-06 ENCOUNTER — Other Ambulatory Visit: Payer: Self-pay | Admitting: Pulmonary Disease

## 2019-12-22 ENCOUNTER — Other Ambulatory Visit: Payer: Self-pay

## 2019-12-22 DIAGNOSIS — K860 Alcohol-induced chronic pancreatitis: Secondary | ICD-10-CM

## 2019-12-22 DIAGNOSIS — J430 Unilateral pulmonary emphysema [MacLeod's syndrome]: Secondary | ICD-10-CM

## 2019-12-22 NOTE — Telephone Encounter (Signed)
traMADol (ULTRAM) 50 MG tablet, refill request @  [facility] [place], [place] - [address] [place] Phone:  [phone]  Fax:  [phone]

## 2019-12-23 MED ORDER — TRAMADOL HCL 50 MG PO TABS: 100.0000 mg | ORAL_TABLET | Freq: Three times a day (TID) | ORAL | 0 refills | Status: DC | PRN

## 2019-12-30 ENCOUNTER — Other Ambulatory Visit: Payer: Self-pay | Admitting: Pulmonary Disease

## 2020-01-13 DIAGNOSIS — L400 Psoriasis vulgaris: Secondary | ICD-10-CM | POA: Diagnosis not present

## 2020-01-17 DIAGNOSIS — L400 Psoriasis vulgaris: Secondary | ICD-10-CM | POA: Diagnosis not present

## 2020-01-19 ENCOUNTER — Other Ambulatory Visit: Payer: Self-pay

## 2020-01-19 DIAGNOSIS — K860 Alcohol-induced chronic pancreatitis: Secondary | ICD-10-CM

## 2020-01-19 DIAGNOSIS — J430 Unilateral pulmonary emphysema [MacLeod's syndrome]: Secondary | ICD-10-CM

## 2020-01-19 MED ORDER — TRAMADOL HCL 50 MG PO TABS: 100.0000 mg | ORAL_TABLET | Freq: Three times a day (TID) | ORAL | 0 refills | Status: DC | PRN

## 2020-01-19 NOTE — Telephone Encounter (Signed)
traMADol (ULTRAM) 50 MG tablet, refill request @  [facility] at [facility] [place], [place] - [address] Phone:  [phone]  Fax:  [phone]

## 2020-02-16 ENCOUNTER — Other Ambulatory Visit: Payer: Self-pay | Admitting: Internal Medicine

## 2020-02-16 DIAGNOSIS — K860 Alcohol-induced chronic pancreatitis: Secondary | ICD-10-CM

## 2020-02-16 DIAGNOSIS — J430 Unilateral pulmonary emphysema [MacLeod's syndrome]: Secondary | ICD-10-CM

## 2020-02-16 MED ORDER — TRAMADOL HCL 50 MG PO TABS: 100.0000 mg | ORAL_TABLET | Freq: Three times a day (TID) | ORAL | 0 refills | Status: DC | PRN

## 2020-02-16 NOTE — Telephone Encounter (Signed)
Please contact patient to schedule OV before next refill is due.

## 2020-02-16 NOTE — Telephone Encounter (Signed)
Need refill on traMADol (ULTRAM) 50 MG tablet  ;pt contact [phone]   [facility] at [facility], [place] - [address]

## 2020-02-21 ENCOUNTER — Telehealth: Payer: Self-pay | Admitting: *Deleted

## 2020-02-21 ENCOUNTER — Encounter: Payer: Self-pay | Admitting: *Deleted

## 2020-02-21 NOTE — Telephone Encounter (Addendum)
Information was sent through CoverMyMeds for PA for Tramadol.  Awaiting determination from [facility] within 72 hours.  Angelina Ok, RN 02/21/2020 3:40 PM.   Tramadol PA was approved 02/21/2020 thru 08/19/2020.  Angelina Ok, RN 02/22/2020 8:59 AM.

## 2020-03-02 NOTE — Telephone Encounter (Signed)
Call to [facility] about PA and patient getting medication.

## 2020-03-02 NOTE — Telephone Encounter (Signed)
Received TC from [place], pharmacist at [facility], states PA for Tramadol was approved but he is still having problems running it through.  He states he called patient's insurance and was told another PA was needed because of the amount/ pills per day patient was taking.  Pharmacist states Venita Sheffield can call him for more info if needed. Will forward to [place]. SChaplin, RN,BSN

## 2020-03-16 ENCOUNTER — Other Ambulatory Visit: Payer: Self-pay | Admitting: Internal Medicine

## 2020-03-16 DIAGNOSIS — J430 Unilateral pulmonary emphysema [MacLeod's syndrome]: Secondary | ICD-10-CM

## 2020-03-16 DIAGNOSIS — K860 Alcohol-induced chronic pancreatitis: Secondary | ICD-10-CM

## 2020-03-20 ENCOUNTER — Telehealth: Payer: Self-pay | Admitting: Internal Medicine

## 2020-03-20 NOTE — Telephone Encounter (Signed)
Pt would like a call back about his Refill  Pt reports he only received 84 pills and he normally gets 180 pills.    traMADol (ULTRAM) 50 MG tablet  [facility] AT [place] [facility] [place], [place] - [address]

## 2020-03-20 NOTE — Telephone Encounter (Signed)
I called and advised him to follow-up with Dr. Nedra Hai, who prescribed his tramadol, on Monday October 4.  Dr. Nedra Hai will help fix his prescription of tramadol.  Patient agrees.  Thank you

## 2020-03-20 NOTE — Telephone Encounter (Addendum)
Pt states he picked up tramadol #84 on 03/17/2020-states he usually gets #180 a month, but needed a PA last month and insurance would only pay for #84.  PA was approved, but rx for #84 was not changed back to #180.  Pt requesting rx for the additional #96 tabs.  CMA spoke with pharmacist-and he recommends pt completing current rx for #84 and have MD send in new rx for #180 to be avail once pt completes the #84 he just received.  Pt still wants rx for the additional #96 and is aware that he will have to pay out of pocket costs if he picks up rx now.  Pt has appt with pcp on Mon 10/04 and CMA advised pt to address refill at that time, but pt wanted this "taken care of " before then.  Please advise.Samuel Spittle Cassady9/27/20212:32 PM

## 2020-03-24 ENCOUNTER — Encounter: Payer: Medicaid Other | Admitting: Internal Medicine

## 2020-03-24 ENCOUNTER — Other Ambulatory Visit: Payer: Self-pay | Admitting: Internal Medicine

## 2020-03-24 DIAGNOSIS — J430 Unilateral pulmonary emphysema [MacLeod's syndrome]: Secondary | ICD-10-CM

## 2020-03-24 DIAGNOSIS — K860 Alcohol-induced chronic pancreatitis: Secondary | ICD-10-CM

## 2020-03-27 ENCOUNTER — Encounter: Payer: Self-pay | Admitting: Internal Medicine

## 2020-03-27 ENCOUNTER — Ambulatory Visit (INDEPENDENT_AMBULATORY_CARE_PROVIDER_SITE_OTHER): Payer: Medicaid Other | Admitting: Internal Medicine

## 2020-03-27 ENCOUNTER — Other Ambulatory Visit: Payer: Self-pay

## 2020-03-27 ENCOUNTER — Other Ambulatory Visit: Payer: Self-pay | Admitting: Internal Medicine

## 2020-03-27 VITALS — BP 126/83 | HR 105 | Temp 97.7°F | Ht 68.0 in | Wt 124.4 lb

## 2020-03-27 DIAGNOSIS — F32A Depression, unspecified: Secondary | ICD-10-CM | POA: Diagnosis not present

## 2020-03-27 DIAGNOSIS — K860 Alcohol-induced chronic pancreatitis: Secondary | ICD-10-CM

## 2020-03-27 DIAGNOSIS — J430 Unilateral pulmonary emphysema [MacLeod's syndrome]: Secondary | ICD-10-CM

## 2020-03-27 DIAGNOSIS — F10929 Alcohol use, unspecified with intoxication, unspecified: Secondary | ICD-10-CM | POA: Diagnosis not present

## 2020-03-27 DIAGNOSIS — Z23 Encounter for immunization: Secondary | ICD-10-CM

## 2020-03-27 MED ORDER — SERTRALINE HCL 50 MG PO TABS: 50.0000 mg | ORAL_TABLET | Freq: Every day | ORAL | 2 refills | Status: DC

## 2020-03-27 NOTE — Assessment & Plan Note (Signed)
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 He was made DNR last visit after goals of care discussion and mentions having increased anxiety and depressed mood recently, especially with worsening respiratory function and his ailing health. Discussed trying anti-depressant therapy and he mentions that he was previously on Prozac but had lack of effect. Discussed trying Zoloft and titrating the dose himself at home based on his mood, which [name] is agreeable to.   A/P Adjustment disorder w/ depressed mood in setting of worsening health. Would benefit from SSRI therapy. - Start Zoloft 50mg  daily (instruction to titrate up dose based on mood) - D/C amitryptyline

## 2020-03-27 NOTE — Progress Notes (Signed)
CC: Shortness of breath  HPI: [name] is a 59 y.o. with PMH listed below presenting with complaint of dyspnea. Please see problem based assessment and plan for further details.  Past Medical History:  Diagnosis Date  . Chronic pancreatitis (HCC)   . Depression   . Duodenitis    by EGD 1/07  . Emphysema    unclear diagnosis noted in previous EMR  . History of alcohol abuse   . Hyperlipidemia   . Psoriasis    extensive, responds to strong steroids, unable to start molecular therapies due to cost  . RUE weakness    2/2 cervical spondylosis  . Stomach ulcer    Review of Systems: Review of Systems  Constitutional: Negative for chills, fever and malaise/fatigue.  Eyes: Negative for blurred vision.  Respiratory: Positive for shortness of breath. Negative for cough.   Cardiovascular: Negative for chest pain and leg swelling.  Gastrointestinal: Positive for abdominal pain. Negative for constipation, diarrhea, nausea and vomiting.  Genitourinary: Negative for dysuria.  Psychiatric/Behavioral: Positive for depression. The patient is nervous/anxious.      Physical Exam: Vitals:   03/27/20 1031  BP: 126/83  Pulse: (!) 105  Temp: 97.7 F (36.5 C)  TempSrc: Oral  SpO2: 98%  Weight: 124 lb 6.4 oz (56.4 kg)  Height: 5\' 8"  (1.727 m)   Gen: Chronically ill-appearing, NAD HEENT: NCAT head, cachetic, hearing intact CV: Tachycardic, S1, S2 normal Pulm: Distant breath sounds, accessory muscle use Extm: ROM intact, Peripheral pulses intact, No peripheral edema Skin: Dry, Warm, normal turgor  Assessment & Plan:   COPD with emphysema (HCC) On Yupelri, Pumicort nebs. Follows w/ Dr.Sood. Unable to be eligible for transplant due to cost and lack of social support. Currently using nebulizers as prescribed but continues to endorse dyspnea and air hunger. He especially having significant shortness of breath when exerting himself while trying to take a shower.   A/P Have not  yet f/u with CT surgery for LVR surgery. As he is no longer transplant candidate, would benefit from discussing with CT regarding risks and benefits of LVR surgery as this discussion was tabled while being evaluated by [facility] Transplant Team. Contact info Dr.Lightfoot's office provided to patient as he was seen previously with the office.  - C/w nebulizers per pulm - F/u w/ CT surgery - C/w tramadol  Chronic pancreatitis due to acute alcohol intoxication (HCC) [name] is on chronic opioid therapy for chronic pain from chronic pancreatitis and air hunger from COPD. As part of the treatment plan, the [place] controlled substance database is checked at least twice yearly and the database results was checked 03/27/20 and is appropriate.   The last UDS was on 07/06/19 and results were as expected. Patient needs a yearly UDS.  Pain contract was updated and signed on 07/06/19. Through the pain contract, [name] describes commitment to avoid excessive opioid use and only relying on pain medications to pursue goal of performing ADLs at home  The patient is on tramadol 100mg  TID prn, 180 per 30 days. Adjunctive treatment includes amitriptyline. This regimen allows [name] to function without excessive sedation or side effects. The benefits of continuing opioid therapy outweigh the risks and chronic opioids will be continued. Ongoing education about safe opioid treatment is provided.    - C/w tramadol 100mg  TID #180 per 30 days - F/u in 3 months   Depression [name] is a 59 yo M w/ PMH of end-stage COPD, chronic pancreatitis  on chronic opioids presenting to [name] for management of his chronic conditions. He was made DNR last visit after goals of care discussion and mentions having increased anxiety and depressed mood recently, especially with worsening respiratory function and his ailing health. Discussed trying anti-depressant therapy and he mentions that he was previously on Prozac but had  lack of effect. Discussed trying Zoloft and titrating the dose himself at home based on his mood, which [name] is agreeable to.   A/P Adjustment disorder w/ depressed mood in setting of worsening health. Would benefit from SSRI therapy. - Start Zoloft 50mg  daily (instruction to titrate up dose based on mood) - D/C amitryptyline    Patient discussed with Dr.  -Criselda Peaches, PGY3 [facility] Internal Medicine Pager: [phone]

## 2020-03-27 NOTE — Patient Instructions (Addendum)
Dear [name],  Thank you for allowing [place] to provide your care today. Today we discussed your anxiety and difficulty breatihing    I have ordered no labs for you. I will call if any are abnormal.    Today we made the following changes to your medications:    Please stop your amitryptyline Please start sertraline 50mg  daily and increase dose by 50mg  a week depending on your symptoms  Please follow-up in 3 weeks.    Should you have any questions or concerns please call the internal medicine clinic at [phone].    Thank you for choosing [name].  Sertraline tablets What is this medicine? SERTRALINE (SER tra leen) is used to treat depression. It may also be used to treat obsessive compulsive disorder, panic disorder, post-trauma stress, premenstrual dysphoric disorder (PMDD) or social anxiety. This medicine may be used for other purposes; ask your health care provider or pharmacist if you have questions. COMMON BRAND NAME(S): Zoloft What should I tell my health care provider before I take this medicine? They need to know if you have any of these conditions:  bleeding disorders  bipolar disorder or a family history of bipolar disorder  glaucoma  heart disease  high blood pressure  history of irregular heartbeat  history of low levels of calcium, magnesium, or potassium in the blood  if you often drink alcohol  liver disease  receiving electroconvulsive therapy  seizures  suicidal thoughts, plans, or attempt; a previous suicide attempt by you or a family member  take medicines that treat or prevent blood clots  thyroid disease  an unusual or allergic reaction to sertraline, other medicines, foods, dyes, or preservatives  pregnant or trying to get pregnant  breast-feeding How should I use this medicine? Take this medicine by mouth with a glass of water. Follow the directions on the prescription label. You can take it with or without food. Take  your medicine at regular intervals. Do not take your medicine more often than directed. Do not stop taking this medicine suddenly except upon the advice of your doctor. Stopping this medicine too quickly may cause serious side effects or your condition may worsen. A special MedGuide will be given to you by the pharmacist with each prescription and refill. Be sure to read this information carefully each time. Talk to your pediatrician regarding the use of this medicine in children. While this drug may be prescribed for children as young as 7 years for selected conditions, precautions do apply. Overdosage: If you think you have taken too much of this medicine contact a poison control center or emergency room at once. NOTE: This medicine is only for you. Do not share this medicine with others. What if I miss a dose? If you miss a dose, take it as soon as you can. If it is almost time for your next dose, take only that dose. Do not take double or extra doses. What may interact with this medicine? Do not take this medicine with any of the following medications:  cisapride  dronedarone  linezolid  MAOIs like Carbex, Eldepryl, Marplan, Nardil, and Parnate  methylene blue (injected into a vein)  pimozide  thioridazine This medicine may also interact with the following medications:  alcohol  amphetamines  aspirin and aspirin-like medicines  certain medicines for depression, anxiety, or psychotic disturbances  certain medicines for fungal infections like ketoconazole, fluconazole, posaconazole, and itraconazole  certain medicines for irregular heart beat like flecainide, quinidine, propafenone  certain medicines  for migraine headaches like almotriptan, eletriptan, frovatriptan, naratriptan, rizatriptan, sumatriptan, zolmitriptan  certain medicines for sleep  certain medicines for seizures like carbamazepine, valproic acid, phenytoin  certain medicines that treat or prevent blood  clots like warfarin, enoxaparin, dalteparin  cimetidine  digoxin  diuretics  fentanyl  isoniazid  lithium  NSAIDs, medicines for pain and inflammation, like ibuprofen or naproxen  other medicines that prolong the QT interval (cause an abnormal heart rhythm) like dofetilide  rasagiline  safinamide  supplements like St. John's wort, kava kava, valerian  tolbutamide  tramadol  tryptophan This list may not describe all possible interactions. Give your health care provider a list of all the medicines, herbs, non-prescription drugs, or dietary supplements you use. Also tell them if you smoke, drink alcohol, or use illegal drugs. Some items may interact with your medicine. What should I watch for while using this medicine? Tell your doctor if your symptoms do not get better or if they get worse. Visit your doctor or health care professional for regular checks on your progress. Because it may take several weeks to see the full effects of this medicine, it is important to continue your treatment as prescribed by your doctor. Patients and their families should watch out for new or worsening thoughts of suicide or depression. Also watch out for sudden changes in feelings such as feeling anxious, agitated, panicky, irritable, hostile, aggressive, impulsive, severely restless, overly excited and hyperactive, or not being able to sleep. If this happens, especially at the beginning of treatment or after a change in dose, call your health care professional. [name] may get drowsy or dizzy. Do not drive, use machinery, or do anything that needs mental alertness until you know how this medicine affects you. Do not stand or sit up quickly, especially if you are an older patient. This reduces the risk of dizzy or fainting spells. Alcohol may interfere with the effect of this medicine. Avoid alcoholic drinks. Your mouth may get dry. Chewing sugarless gum or sucking hard candy, and drinking plenty of water  may help. Contact your doctor if the problem does not go away or is severe. What side effects may I notice from receiving this medicine? Side effects that you should report to your doctor or health care professional as soon as possible:  allergic reactions like skin rash, itching or hives, swelling of the face, lips, or tongue  anxious  black, tarry stools  changes in vision  confusion  elevated mood, decreased need for sleep, racing thoughts, impulsive behavior  eye pain  fast, irregular heartbeat  feeling faint or lightheaded, falls  feeling agitated, angry, or irritable  hallucination, loss of contact with reality  loss of balance or coordination  loss of memory  painful or prolonged erections  restlessness, pacing, inability to keep still  seizures  stiff muscles  suicidal thoughts or other mood changes  trouble sleeping  unusual bleeding or bruising  unusually weak or tired  vomiting Side effects that usually do not require medical attention (report to your doctor or health care professional if they continue or are bothersome):  change in appetite or weight  change in sex drive or performance  diarrhea  increased sweating  indigestion, nausea  tremors This list may not describe all possible side effects. Call your doctor for medical advice about side effects. You may report side effects to FDA at 1-800-FDA-1088. Where should I keep my medicine? Keep out of the reach of children. Store at room temperature between 15 and  30 degrees C (59 and 86 degrees F). Throw away any unused medicine after the expiration date. NOTE: This sheet is a summary. It may not cover all possible information. If you have questions about this medicine, talk to your doctor, pharmacist, or health care provider.  2020 Elsevier/Gold Standard (2018-06-02 10:09:27)

## 2020-03-27 NOTE — Assessment & Plan Note (Signed)
[name] is on chronic opioid therapy for chronic pain from chronic pancreatitis and air hunger from COPD. As part of the treatment plan, the [place] controlled substance database is checked at least twice yearly and the database results was checked 03/27/20 and is appropriate.   The last UDS was on 07/06/19 and results were as expected. Patient needs a yearly UDS.  Pain contract was updated and signed on 07/06/19. Through the pain contract, [name] describes commitment to avoid excessive opioid use and only relying on pain medications to pursue goal of performing ADLs at home  The patient is on tramadol 100mg  TID prn, 180 per 30 days. Adjunctive treatment includes amitriptyline. This regimen allows [name] to function without excessive sedation or side effects. The benefits of continuing opioid therapy outweigh the risks and chronic opioids will be continued. Ongoing education about safe opioid treatment is provided.    - C/w tramadol 100mg  TID #180 per 30 days - F/u in 3 months

## 2020-03-27 NOTE — Assessment & Plan Note (Signed)
On Yupelri, Pumicort nebs. Follows w/ Dr.Sood. Unable to be eligible for transplant due to cost and lack of social support. Currently using nebulizers as prescribed but continues to endorse dyspnea and air hunger. He especially having significant shortness of breath when exerting himself while trying to take a shower.   A/P Have not yet f/u with CT surgery for LVR surgery. As he is no longer transplant candidate, would benefit from discussing with CT regarding risks and benefits of LVR surgery as this discussion was tabled while being evaluated by [facility]. Contact info Dr.Lightfoot's office provided to patient as he was seen previously with the office.  - C/w nebulizers per pulm - F/u w/ CT surgery - C/w tramadol

## 2020-03-30 NOTE — Progress Notes (Signed)
Internal Medicine Clinic Attending  Case discussed with Dr. Lee  At the time of the visit.  We reviewed the resident's history and exam and pertinent patient test results.  I agree with the assessment, diagnosis, and plan of care documented in the resident's note.    

## 2020-04-14 DIAGNOSIS — L400 Psoriasis vulgaris: Secondary | ICD-10-CM | POA: Diagnosis not present

## 2020-04-17 ENCOUNTER — Other Ambulatory Visit: Payer: Self-pay | Admitting: Pulmonary Disease

## 2020-04-17 ENCOUNTER — Other Ambulatory Visit (HOSPITAL_COMMUNITY): Payer: Self-pay | Admitting: Dermatology

## 2020-04-17 DIAGNOSIS — L400 Psoriasis vulgaris: Secondary | ICD-10-CM | POA: Diagnosis not present

## 2020-04-27 ENCOUNTER — Other Ambulatory Visit: Payer: Self-pay

## 2020-04-27 ENCOUNTER — Other Ambulatory Visit (HOSPITAL_COMMUNITY): Payer: Self-pay | Admitting: Internal Medicine

## 2020-04-27 DIAGNOSIS — K860 Alcohol-induced chronic pancreatitis: Secondary | ICD-10-CM

## 2020-04-27 DIAGNOSIS — J430 Unilateral pulmonary emphysema [MacLeod's syndrome]: Secondary | ICD-10-CM

## 2020-04-27 MED ORDER — TRAMADOL HCL 50 MG PO TABS: 100.0000 mg | ORAL_TABLET | Freq: Three times a day (TID) | ORAL | 0 refills | Status: DC | PRN

## 2020-04-27 NOTE — Telephone Encounter (Signed)
traMADol (ULTRAM) 50 MG tablet, refill request @  [facility] at [facility] [place], [place] - [address] Phone:  [phone]  Fax:  [phone]

## 2020-05-12 ENCOUNTER — Other Ambulatory Visit: Payer: Self-pay | Admitting: Pulmonary Disease

## 2020-05-15 ENCOUNTER — Ambulatory Visit: Payer: Medicaid Other | Admitting: Pulmonary Disease

## 2020-05-15 ENCOUNTER — Encounter: Payer: Self-pay | Admitting: Pulmonary Disease

## 2020-05-15 ENCOUNTER — Ambulatory Visit (INDEPENDENT_AMBULATORY_CARE_PROVIDER_SITE_OTHER): Payer: Medicaid Other

## 2020-05-15 ENCOUNTER — Other Ambulatory Visit: Payer: Self-pay | Admitting: Pulmonary Disease

## 2020-05-15 ENCOUNTER — Other Ambulatory Visit: Payer: Self-pay

## 2020-05-15 VITALS — BP 90/60 | HR 109 | Temp 97.2°F | Ht 68.0 in | Wt 121.8 lb

## 2020-05-15 DIAGNOSIS — J439 Emphysema, unspecified: Secondary | ICD-10-CM

## 2020-05-15 DIAGNOSIS — R0609 Other forms of dyspnea: Secondary | ICD-10-CM | POA: Insufficient documentation

## 2020-05-15 DIAGNOSIS — J430 Unilateral pulmonary emphysema [MacLeod's syndrome]: Secondary | ICD-10-CM

## 2020-05-15 DIAGNOSIS — J449 Chronic obstructive pulmonary disease, unspecified: Secondary | ICD-10-CM | POA: Diagnosis not present

## 2020-05-15 DIAGNOSIS — R0600 Dyspnea, unspecified: Secondary | ICD-10-CM | POA: Diagnosis not present

## 2020-05-15 DIAGNOSIS — Z0000 Encounter for general adult medical examination without abnormal findings: Secondary | ICD-10-CM | POA: Insufficient documentation

## 2020-05-15 MED ORDER — ALBUTEROL SULFATE (2.5 MG/3ML) 0.083% IN NEBU: 2.5000 mg | INHALATION_SOLUTION | Freq: Four times a day (QID) | RESPIRATORY_TRACT | 0 refills | Status: DC | PRN

## 2020-05-15 MED ORDER — FORMOTEROL FUMARATE 20 MCG/2ML IN NEBU: 20.0000 ug | INHALATION_SOLUTION | Freq: Two times a day (BID) | RESPIRATORY_TRACT | 0 refills | Status: DC

## 2020-05-15 MED ORDER — FORMOTEROL FUMARATE 20 MCG/2ML IN NEBU: INHALATION_SOLUTION | RESPIRATORY_TRACT | 3 refills | Status: DC

## 2020-05-15 MED ORDER — YUPELRI 175 MCG/3ML IN SOLN: 175.0000 ug | Freq: Every day | RESPIRATORY_TRACT | 0 refills | Status: DC

## 2020-05-15 MED ORDER — PULMICORT 0.5 MG/2ML IN SUSP: RESPIRATORY_TRACT | 11 refills | Status: DC

## 2020-05-15 MED ORDER — YUPELRI 175 MCG/3ML IN SOLN: RESPIRATORY_TRACT | 3 refills | Status: DC

## 2020-05-15 NOTE — Assessment & Plan Note (Signed)
Plan: Continue Yupelri Continue Perforomist Continue Pulmicort nebs Follow-up with cardiothoracic surgery about lung volume reduction surgery We will send chart and message to Dr. Cliffton Asters May be able to consider patient also for the [facility] for procedure at [facility] if he is not a candidate for lung volume reduction surgery Walk today in office stable, no oxygen desaturations, only able to ambulate a short distance around 100 feet

## 2020-05-15 NOTE — Patient Instructions (Addendum)
You were seen today by Coral Ceo, NP  for:   1. Unilateral emphysema (HCC) 2. Giant bullous emphysema (HCC)  - DG Chest 2 View; Future  Walk today in office  Continue Yupelri nebulized meds Continue performist  nebulized meds taken twice daily Continue Pulmicort nebs taken twice daily  I would like to refer you back to cardiothoracic surgery Dr. Cliffton Asters.  I believe it would be good for [name] to discuss the volume reduction surgery  As discussed today another option could be considering a Zephyr valve procedure at [facility]  Note your daily symptoms > remember "red flags" for COPD:   >>>Increase in cough >>>increase in sputum production >>>increase in shortness of breath or activity  intolerance.   If you notice these symptoms, please call the office to be seen.    3. Dyspnea on exertion  Walk today in office stable  Chest x-ray today  4. Healthcare maintenance  Great job being up-to-date with your vaccinations   We recommend today:  Orders Placed This Encounter  Procedures  . DG Chest 2 View    Standing Status:   Future    Number of Occurrences:   1    Standing Expiration Date:   09/12/2020    Order Specific Question:   Reason for Exam (SYMPTOM  OR DIAGNOSIS REQUIRED)    Answer:   emphysema    Order Specific Question:   Preferred imaging location?    Answer:   Internal    Order Specific Question:   Radiology Contrast Protocol - do NOT remove file path    Answer:   [email or web address]   Orders Placed This Encounter  Procedures  . DG Chest 2 View   No orders of the defined types were placed in this encounter.   Follow Up:    Return in about 2 months (around 07/15/2020), or if symptoms worsen or fail to improve, for Follow up with Dr. Craige Cotta.   Notification of test results are managed in the following manner: If there are  any recommendations or changes to the  plan of care discussed in office today,  we  will contact you and let you know what they are. If you do not hear from [name], then your results are normal and you can view them through your  MyChart account , or a letter will be sent to you. Thank you again for trusting [name] with your care  - Thank you, [facility]    It is flu season:   >>> Best ways to protect herself from the flu: Receive the yearly flu vaccine, practice good hand hygiene washing with soap and also using hand sanitizer when available, eat a nutritious meals, get adequate rest, hydrate appropriately       Please contact the office if your symptoms worsen or you have concerns that you are not improving.   Thank you for choosing [facility] for your healthcare, and for allowing [name] to partner with you on your healthcare journey. I am thankful to be able to provide care to you today.   Elisha Headland FNP-C

## 2020-05-15 NOTE — Assessment & Plan Note (Signed)
Largely due to severe diffusion defect and bullous emphysema

## 2020-05-15 NOTE — Assessment & Plan Note (Signed)
Plan: Continue Yupelri Continue Perforomist Continue Pulmicort nebs Follow-up with cardiothoracic surgery about lung volume reduction surgery We will send chart and message to Dr. Lightfoot May be able to consider patient also for the Zephyr for procedure at [place] if he is not a candidate for lung volume reduction surgery Walk today in office stable, no oxygen desaturations, only able to ambulate a short distance around 100 feet 

## 2020-05-15 NOTE — Assessment & Plan Note (Signed)
Up-to-date with flu vaccination and COVID-19 vaccinations  Plan: We will need to review with patient at next office visit he is due for the Pneumovax 23 given the severity of his lung disease

## 2020-05-15 NOTE — Progress Notes (Signed)
@Patient  ID: Samuel SaaKenneth T Haney, male    DOB: [birth year], 59 y.o.   MRN: [id number]  Chief Complaint  Patient presents with  . Follow-up    Needs inhalers/nebullizers refilled, feels like breathing is the same as last visit. Has been coughing up brown sputum    Referring provider: Theotis BarrioLee, Joshua K, MD  HPI:  59 year old male former smoker followed in our office for COPD and history of lung nodules  Past medical history: Chronic pancreatitis, Depression, ETOH, HLD, Psoriasis, PUD, Rt PTX Smoking history: Former smoker. Quit 2019. 60 pack year smoking.  Maintenance: Pulmicort nebs, Performist nebs, Yupelri Patient of Dr. Craige CottaSood  05/15/2020  - Visit   59 year old male former smoker followed in our office for COPD, bullous emphysema.  Last completed a virtual visit in February/2021.  At that time he was referred for lung transplant evaluation.  He completed this lung transplant evaluation with [facility].  Unfortunately the patient was unable to proceed forward with this given that he is unable to move there and he does not have the social/financial support in order to transport him to those appointments as well as [place] 1 month status post transplant.  Patient presenting back to our office today requesting refills of his nebulized meds.  He feels that he has been more short of breath as of late.  He was previously seen by cardiothoracic surgery Dr. Cliffton AstersLightfoot.  Last evaluation was in October/2020.  That assessment and plan as listed below:  Assessment / Plan:   59 yo male with severe COPD that is most severe in the right upper lobe.  He essentially would require a unilateral lung volume reduction surgery.  Typically, a giant bullae would be a contraindication, but he also has several criteria that would make him a possible candidate.  I will obtain a new set of pulmonary function tests to assess his current status.  I explained to him that if his pulmonary function tests are  much worst, he would not be a candidate for lung volume reduction surgery, and would likely need further evaluation by a lung transplant team.       I  spent 40 minutes with  the patient face to face and greater then 50% of the time was spent in counseling and coordination of care.    Corliss SkainsHarrell O Lightfoot 03/26/2019 9:56 AM  Patient denies significant fevers or congestion.  Sometimes he does get night sweats.  He feels that he may be a little bit more short of breath as of late.  He is up-to-date with his COVID-19 vaccinations including booster as well as a flu vaccine.  He is wondering if he could proceed forward with a lung volume reduction surgery given the fact that he is not able to proceed forward with a lung transplant at this time.  Questionaires / Pulmonary Flowsheets:   ACT:  No flowsheet data found.  MMRC: No flowsheet data found.  Epworth:  No flowsheet data found.  Tests:   Pulmonary tests:  PFT 07/14/17 >> FEV1 1.69 (48%), FEV1% 41, TLC 7.09 (107%), RV 3.41 (168%), DLCO 32% A1AT 05/27/18 >> 192, MM ONO with RA 06/09/18 >> test time 8 hrs 59 min.  Baseline SpO2 95%, low SpO2 90%  Chest imaging:  CT angio chest 05/22/18 >> severe emphysema, moderate bullous area Rt upper lung, 2 mm nodule RLL, 7 mm nodule in lingula, 21 mm nodule LLL CT chest 09/09/18 >> bullous emphysema, mod/severe centrilobular emphysema, diffuse bronchial  wall thickening, resolved nodule LLL, new 6 mm solid nodule LUL, new bandlike consolidation RUP CT chest 03/10/19 >> LUL nodule resolved, stable 3 mm nodule RUL  Cardiac tests:  Echo 10/11/16 >> EF 69%, grade 1 DD   FENO:  No results found for: NITRICOXIDE  PFT: PFT Results Latest Ref Rng & Units 04/06/2019 07/14/2017  FVC-Pre L 2.57 3.51  FVC-Predicted Pre % 56 76  FVC-Post L 3.89 4.08  FVC-Predicted Post % 85 88  Pre FEV1/FVC % % 57 41  Post FEV1/FCV % % 42 41  FEV1-Pre L 1.48 1.44  FEV1-Predicted Pre % 42 40  FEV1-Post L 1.62 1.69    DLCO uncorrected ml/min/mmHg 8.93 9.72  DLCO UNC% % 33 32  DLVA Predicted % 37 39  TLC L 7.04 7.09  TLC % Predicted % 107 107  RV % Predicted % 152 168    WALK:  SIX MIN WALK 05/15/2020  Supplimental Oxygen during Test? (L/min) No  Tech Comments: Patient walked half a lap and expressed shortness of breath, maintainted good sats    Imaging: No results found.  Lab Results:  CBC    Component Value Date/Time   WBC 11.2 (H) 05/22/2018 1148   RBC 5.03 05/22/2018 1148   HGB 15.0 05/22/2018 1148   HGB WILL FOLLOW 08/01/2017 1031   HCT 46.6 05/22/2018 1148   HCT WILL FOLLOW 08/01/2017 1031   PLT 200 05/22/2018 1148   PLT WILL FOLLOW 08/01/2017 1031   MCV 92.6 05/22/2018 1148   MCV WILL FOLLOW 08/01/2017 1031   MCH 29.8 05/22/2018 1148   MCHC 32.2 05/22/2018 1148   RDW 13.1 05/22/2018 1148   RDW WILL FOLLOW 08/01/2017 1031   LYMPHSABS 1.2 05/22/2018 1148   LYMPHSABS WILL FOLLOW 08/01/2017 1031   MONOABS 0.7 05/22/2018 1148   EOSABS 0.1 05/22/2018 1148   EOSABS WILL FOLLOW 08/01/2017 1031   BASOSABS 0.0 05/22/2018 1148   BASOSABS WILL FOLLOW 08/01/2017 1031    BMET    Component Value Date/Time   NA 140 05/22/2018 1148   NA 140 01/09/2018 1535   K 3.6 05/22/2018 1148   CL 104 05/22/2018 1148   CO2 24 05/22/2018 1148   GLUCOSE 91 05/22/2018 1148   BUN 18 05/22/2018 1148   BUN 14 01/09/2018 1535   CREATININE 0.83 05/22/2018 1148   CALCIUM 9.5 05/22/2018 1148   GFRNONAA >60 05/22/2018 1148   GFRAA >60 05/22/2018 1148    BNP No results found for: BNP  ProBNP    Component Value Date/Time   PROBNP <30.0 08/03/2007 1425    Specialty Problems      Pulmonary Problems   COPD with emphysema (HCC)    PFTs c/w severe obstruction, possible underlying restrictive process as there was no significant overinflation and a diffusion defect present   Dr.Sood pulmonologist      Lung nodule    Irregular 1.2x2.1cm nodule LLL. Found on Low dose CT scan 05/15/18,  Confirmed on CTA 05/22/18.  CT 08/2018: resolution of LLL nodule. New 6mm LUL nodule. F/u ct in 6 months per pulm  CT 02/2019: Resolution on LUL nodule. Stable RUL nodule. No need for additional f/u imaging.      Dyspnea on exertion   Giant bullous emphysema (HCC)      Allergies  Allergen Reactions  . Nsaids Other (See Comments)    Bleeding gastritis    Immunization History  Administered Date(s) Administered  . Influenza Whole 02/20/2010  . Influenza,inj,Quad PF,6+ Mos 04/01/2017, 06/09/2018, 03/18/2019,  03/27/2020  . Influenza-Unspecified 03/28/2017  . PFIZER SARS-COV-2 Vaccination 09/17/2019, 10/21/2019  . Tdap 07/27/2009   Patient needs Pneumovax 23 at next office visit given severity of lung disease  Past Medical History:  Diagnosis Date  . Chronic pancreatitis (HCC)   . Depression   . Duodenitis    by EGD 1/07  . Emphysema    unclear diagnosis noted in previous EMR  . History of alcohol abuse   . Hyperlipidemia   . Psoriasis    extensive, responds to strong steroids, unable to start molecular therapies due to cost  . RUE weakness    2/2 cervical spondylosis  . Stomach ulcer     Tobacco History: Social History   Tobacco Use  Smoking Status Former Smoker  . Packs/day: 1.50  . Years: 40.00  . Pack years: 60.00  . Types: Cigarettes  . Quit date: 05/19/2018  . Years since quitting: 1.9  Smokeless Tobacco Never Used   Counseling given: Yes   Continue to not smoke  Outpatient Encounter Medications as of 05/15/2020  Medication Sig  . albuterol (PROVENTIL) (2.5 MG/3ML) 0.083% nebulizer solution Take 3 mLs (2.5 mg total) by nebulization every 6 (six) hours as needed for wheezing or shortness of breath.  Marland Kitchen albuterol (VENTOLIN HFA) 108 (90 Base) MCG/ACT inhaler INHALE 2 PUFFS INTO THE LUNGS EVERY 6 (SIX) HOURS AS NEEDED FOR WHEEZING OR SHORTNESS OF BREATH.  . formoterol (PERFOROMIST) 20 MCG/2ML nebulizer solution TAKE 2 MLS (20 MCG TOTAL) BY NEBULIZATION 2  (TWO) TIMES DAILY.  Marland Kitchen PULMICORT 0.5 MG/2ML nebulizer solution INHALE 1 VIAL BY NEBULIZATION 2 TIMES DAILY.  . revefenacin (YUPELRI) 175 MCG/3ML nebulizer solution TAKE 175 MCG ( ) BY NEBULIZATION DAILY.  Marland Kitchen sertraline (ZOLOFT) 50 MG tablet Take 1 tablet (50 mg total) by mouth daily.  . traMADol (ULTRAM) 50 MG tablet Take 2 tablets (100 mg total) by mouth every 8 (eight) hours as needed.  . [DISCONTINUED] albuterol (PROVENTIL) (2.5 MG/3ML) 0.083% nebulizer solution Take 3 mLs (2.5 mg total) by nebulization every 6 (six) hours as needed for wheezing or shortness of breath.  . [DISCONTINUED] albuterol (PROVENTIL) (2.5 MG/3ML) 0.083% nebulizer solution Take 3 mLs (2.5 mg total) by nebulization every 6 (six) hours as needed for wheezing or shortness of breath.  . [DISCONTINUED] PERFOROMIST 20 MCG/2ML nebulizer solution TAKE 2 MLS (20 MCG TOTAL) BY NEBULIZATION 2 (TWO) TIMES DAILY.  . [DISCONTINUED] PULMICORT 0.5 MG/2ML nebulizer solution INHALE 1 VIAL BY NEBULIZATION 2 TIMES DAILY.  . [DISCONTINUED] YUPELRI 175 MCG/3ML nebulizer solution TAKE 175 MCG ( ) BY NEBULIZATION DAILY.  . formoterol (PERFOROMIST) 20 MCG/2ML nebulizer solution Take 2 mLs (20 mcg total) by nebulization 2 (two) times daily.  . revefenacin (YUPELRI) 175 MCG/3ML nebulizer solution Take 3 mLs (175 mcg total) by nebulization daily.   No facility-administered encounter medications on file as of 05/15/2020.     Review of Systems  Review of Systems  Constitutional: Positive for fatigue. Negative for activity change, diaphoresis and fever.  HENT: Positive for congestion (yellow mucous ) and postnasal drip. Negative for sneezing, sore throat and trouble swallowing.   Eyes: Negative.   Respiratory: Positive for cough, shortness of breath and wheezing (some - at baseline ).   Cardiovascular: Negative for chest pain and palpitations.  Gastrointestinal: Negative for diarrhea, nausea and vomiting.  Endocrine: Negative.    Genitourinary: Negative.   Musculoskeletal: Negative.  Negative for arthralgias.  Skin: Negative.   Allergic/Immunologic: Negative.   Neurological: Negative.   Hematological: Negative.  Psychiatric/Behavioral: Negative.  Negative for dysphoric mood. The patient is not nervous/anxious.      Physical Exam  BP 90/60 (BP Location: Left Arm, Patient Position: Sitting, Cuff Size: Normal)   Pulse (!) 109   Temp (!) 97.2 F (36.2 C) (Temporal)   Ht 5\' 8"  (1.727 m)   Wt 121 lb 12.8 oz (55.2 kg)   SpO2 98%   BMI 18.52 kg/m   Wt Readings from Last 5 Encounters:  05/15/20 121 lb 12.8 oz (55.2 kg)  03/27/20 124 lb 6.4 oz (56.4 kg)  11/09/19 127 lb 11.2 oz (57.9 kg)  07/16/19 130 lb (59 kg)  07/06/19 130 lb 9.6 oz (59.2 kg)  Reviewed with patient weight loss   BMI Readings from Last 5 Encounters:  05/15/20 18.52 kg/m  03/27/20 18.91 kg/m  11/09/19 19.42 kg/m  07/16/19 19.77 kg/m  07/06/19 19.86 kg/m     Physical Exam Vitals and nursing note reviewed.  Constitutional:      General: He is not in acute distress.    Appearance: Normal appearance. He is normal weight.     Comments: Thin frail adult male  HENT:     Head: Normocephalic and atraumatic.     Right Ear: Hearing and external ear normal.     Left Ear: Hearing and external ear normal.     Nose: Nose normal. No mucosal edema or rhinorrhea.     Right Turbinates: Not enlarged.     Left Turbinates: Not enlarged.     Mouth/Throat:     Mouth: Mucous membranes are dry.     Pharynx: Oropharynx is clear. No oropharyngeal exudate.  Eyes:     Pupils: Pupils are equal, round, and reactive to light.  Cardiovascular:     Rate and Rhythm: Normal rate and regular rhythm.     Pulses: Normal pulses.     Heart sounds: Normal heart sounds. No murmur heard.   Pulmonary:     Effort: Pulmonary effort is normal.     Breath sounds: No decreased breath sounds, wheezing or rales.     Comments: Diminished right upper breath  sounds Musculoskeletal:     Cervical back: Normal range of motion.     Right lower leg: No edema.     Left lower leg: No edema.  Lymphadenopathy:     Cervical: No cervical adenopathy.  Skin:    General: Skin is warm and dry.     Capillary Refill: Capillary refill takes less than 2 seconds.     Findings: No erythema or rash.  Neurological:     General: No focal deficit present.     Mental Status: He is alert and oriented to person, place, and time.     Motor: No weakness.     Coordination: Coordination normal.     Gait: Gait is intact. Gait normal.  Psychiatric:        Mood and Affect: Mood normal.        Behavior: Behavior normal. Behavior is cooperative.        Thought Content: Thought content normal.        Judgment: Judgment normal.       Assessment & Plan:   COPD with emphysema (HCC) Plan: Continue Yupelri Continue Perforomist Continue Pulmicort nebs Follow-up with cardiothoracic surgery about lung volume reduction surgery We will send chart and message to Dr. 09/03/19 May be able to consider patient also for the [facility] for procedure at [facility] if he is not a candidate for lung  volume reduction surgery Walk today in office stable, no oxygen desaturations, only able to ambulate a short distance around 100 feet  Giant bullous emphysema (HCC) Plan: Continue Yupelri Continue Perforomist Continue Pulmicort nebs Follow-up with cardiothoracic surgery about lung volume reduction surgery We will send chart and message to Dr. Cliffton Asters May be able to consider patient also for the [facility] for procedure at [facility] if he is not a candidate for lung volume reduction surgery Walk today in office stable, no oxygen desaturations, only able to ambulate a short distance around 100 feet  Dyspnea on exertion Largely due to severe diffusion defect and bullous emphysema  Healthcare maintenance Up-to-date with flu vaccination and COVID-19 vaccinations  Plan: We will need  to review with patient at next office visit he is due for the Pneumovax 23 given the severity of his lung disease    Return in about 2 months (around 07/15/2020), or if symptoms worsen or fail to improve, for Follow up with Dr. Craige Cotta.   Coral Ceo, NP 05/15/2020   This appointment required 35 minutes of patient care (this includes precharting, chart review, review of results, face-to-face care, etc.).

## 2020-05-16 ENCOUNTER — Telehealth: Payer: Self-pay | Admitting: Pulmonary Disease

## 2020-05-16 NOTE — Telephone Encounter (Signed)
I called and spoke with patient and he wants to want to speak with Dr. Craige Cotta in Jan 2022. Nothing further needed at this time.

## 2020-05-16 NOTE — Telephone Encounter (Signed)
05/16/2020  Discussed case with Dr. Cliffton Asters with cardiothoracic surgery.  He feels patient it would not be a great candidate for the lung volume reduction surgery given the large bulla.  Also not sure the patient would receive much clinical benefit.  Can we please notify the patient of this information.  We can offer a referral to [facility] pulmonary to see if patient would be a good candidate for the [place] procedure.  This would be with Dr. Edward Jolly.  If patient would like to wait and discuss this with Dr. Craige Cotta further in January/2022 that is also reasonable.  Elisha Headland, FNP

## 2020-05-17 NOTE — Progress Notes (Signed)
Reviewed and agree with assessment/plan.   Kwinton Maahs, MD [facility] 05/17/2020, 7:26 PM Pager:  [phone]  

## 2020-05-29 ENCOUNTER — Other Ambulatory Visit: Payer: Self-pay

## 2020-05-29 ENCOUNTER — Other Ambulatory Visit: Payer: Self-pay | Admitting: *Deleted

## 2020-05-29 ENCOUNTER — Other Ambulatory Visit: Payer: Self-pay | Admitting: Internal Medicine

## 2020-05-29 DIAGNOSIS — J430 Unilateral pulmonary emphysema [MacLeod's syndrome]: Secondary | ICD-10-CM

## 2020-05-29 DIAGNOSIS — K860 Alcohol-induced chronic pancreatitis: Secondary | ICD-10-CM

## 2020-05-29 MED ORDER — TRAMADOL HCL 50 MG PO TABS: 100.0000 mg | ORAL_TABLET | Freq: Three times a day (TID) | ORAL | 0 refills | Status: DC | PRN

## 2020-05-29 NOTE — Telephone Encounter (Signed)
traMADol (ULTRAM) 50 MG tablet, REFILL REQUEST @  [facility] at [facility] [place], [place] - [address] Phone:  [phone]  Fax:  [phone]

## 2020-05-29 NOTE — Telephone Encounter (Signed)
Dr Cyndie Chime stated he's not Medicaid certified. I will ask the Attending to re-send rx. Thanks

## 2020-05-29 NOTE — Telephone Encounter (Signed)
Last rx written 04/27/20. Last OV 10/4 21. Next OV has not been scheduling. UDS 07/06/19.

## 2020-05-31 ENCOUNTER — Ambulatory Visit: Payer: Medicaid Other | Attending: Internal Medicine

## 2020-05-31 ENCOUNTER — Other Ambulatory Visit (HOSPITAL_COMMUNITY): Payer: Self-pay | Admitting: Internal Medicine

## 2020-05-31 DIAGNOSIS — Z23 Encounter for immunization: Secondary | ICD-10-CM

## 2020-05-31 NOTE — Progress Notes (Signed)
   Covid-19 Vaccination Clinic  Name:  [name]    MRN: [id number] DOB: September 26, 1960  05/31/2020  [name] was observed post Covid-19 immunization for 15 minutes without incident. He was provided with Vaccine Information Sheet and instruction to access the V-Safe system.   [name] was instructed to call 911 with any severe reactions post vaccine: Marland Kitchen Difficulty breathing  . Swelling of face and throat  . A fast heartbeat  . A bad rash all over body  . Dizziness and weakness   Immunizations Administered    Name Date Dose VIS Date Route   Pfizer COVID-19 Vaccine 05/31/2020 12:12 PM 0.3 mL 04/12/2020 Intramuscular   Manufacturer: [employer], [employer]   Lot: [id number]   NDC: [address]-0

## 2020-06-26 ENCOUNTER — Other Ambulatory Visit: Payer: Self-pay | Admitting: Internal Medicine

## 2020-06-26 ENCOUNTER — Other Ambulatory Visit: Payer: Self-pay

## 2020-06-26 DIAGNOSIS — K860 Alcohol-induced chronic pancreatitis: Secondary | ICD-10-CM

## 2020-06-26 DIAGNOSIS — J430 Unilateral pulmonary emphysema [MacLeod's syndrome]: Secondary | ICD-10-CM

## 2020-06-26 MED ORDER — TRAMADOL HCL 50 MG PO TABS: 100.0000 mg | ORAL_TABLET | Freq: Three times a day (TID) | ORAL | 0 refills | Status: DC | PRN

## 2020-06-26 NOTE — Telephone Encounter (Signed)
  traMADol (ULTRAM) 50 MG tablet, REFILL REQUEST @   [facility] at [facility] [place], [place] - [address] Phone:  [phone]  Fax:  [phone]

## 2020-06-26 NOTE — Assessment & Plan Note (Addendum)
Refill request for tramadol. He is on chronic tramadol 100 mg tid prn for chronic pancreatitis and air hunger from COPD. Last UDS was 1/21 and results were reviewed and as expected. PDMP reviewed. Last pain contract signed 07/06/19.   - refill tramadol - f/u for yearly UDS prior to next refill

## 2020-06-28 ENCOUNTER — Encounter: Payer: Self-pay | Admitting: Internal Medicine

## 2020-06-28 ENCOUNTER — Other Ambulatory Visit: Payer: Self-pay

## 2020-06-28 ENCOUNTER — Other Ambulatory Visit: Payer: Self-pay | Admitting: Internal Medicine

## 2020-06-28 ENCOUNTER — Ambulatory Visit: Payer: Medicaid Other | Admitting: Internal Medicine

## 2020-06-28 VITALS — BP 109/59 | HR 96 | Temp 97.8°F | Ht 68.0 in | Wt 121.9 lb

## 2020-06-28 DIAGNOSIS — F10929 Alcohol use, unspecified with intoxication, unspecified: Secondary | ICD-10-CM | POA: Diagnosis not present

## 2020-06-28 DIAGNOSIS — K860 Alcohol-induced chronic pancreatitis: Secondary | ICD-10-CM

## 2020-06-28 DIAGNOSIS — R0602 Shortness of breath: Secondary | ICD-10-CM

## 2020-06-28 DIAGNOSIS — F32A Depression, unspecified: Secondary | ICD-10-CM

## 2020-06-28 DIAGNOSIS — R531 Weakness: Secondary | ICD-10-CM

## 2020-06-28 MED ORDER — AMITRIPTYLINE HCL 25 MG PO TABS: 25.0000 mg | ORAL_TABLET | Freq: Every day | ORAL | 5 refills | Status: DC

## 2020-06-28 NOTE — Progress Notes (Signed)
   CC: chronic pancreatitis   HPI:  [name] is a 60 y.o. with PMH as below.   Please see A&P for assessment of the patient's acute and chronic medical conditions.   Past Medical History:  Diagnosis Date  . Chronic pancreatitis (HCC)   . Depression   . Duodenitis    by EGD 1/07  . Emphysema    unclear diagnosis noted in previous EMR  . History of alcohol abuse   . Hyperlipidemia   . Psoriasis    extensive, responds to strong steroids, unable to start molecular therapies due to cost  . RUE weakness    2/2 cervical spondylosis  . Stomach ulcer    Review of Systems:   Review of Systems  Constitutional: Negative for chills and fever.  HENT: Negative for sore throat.   Respiratory: Positive for shortness of breath. Negative for cough, sputum production and wheezing.   Cardiovascular: Negative for chest pain and leg swelling.  Gastrointestinal: Negative for abdominal pain, nausea and vomiting.  Neurological: Positive for weakness. Negative for dizziness, sensory change and focal weakness.  Psychiatric/Behavioral: Positive for depression. Negative for suicidal ideas.   Physical Exam:   Constitution: NAD, appears stated age Eyes: no icterus or injection  Cardio: tachycardic, regular rhythm, no m/r/g, no LE edema  Respiratory: on room air, clear to auscultation MSK: moving all extremities Neuro: normal affect, a&ox3,pleasant Skin: c/d/i    Vitals:   06/28/20 1408  BP: (!) 109/59  Pulse: 96  Temp: 97.8 F (36.6 C)  TempSrc: Oral  SpO2: 97%  Weight: 121 lb 14.4 oz (55.3 kg)  Height: 5\' 8"  (1.727 m)    Assessment & Plan:   See Encounters Tab for problem based charting.  Patient discussed with Dr. 

## 2020-06-28 NOTE — Patient Instructions (Addendum)
Thank you for allowing [place] to provide your care today. Today we discussed     I have ordered the following labs for you:    I will call if any are abnormal.    Today we made the following changes to your medications:   Please START taking  amitriptyline 25 mg - take one tablet every evening prior to bed.     Please STOP taking   zoloft  Please follow-up in one month for telehealth visit.    Please call the internal medicine center clinic if you have any questions or concerns, we may be able to help and keep you from a long and expensive emergency room wait. Our clinic and after hours phone number is [phone], the best time to call is Monday through Friday 9 am to 4 pm but there is always someone available 24/7 if you have an emergency. If you need medication refills please notify your pharmacy one week in advance and they will send [place] a request.

## 2020-06-29 NOTE — Assessment & Plan Note (Signed)
He was switched from amitriptyline to zoloft in early October. He states this has not helped, and he would like to switch back to amitriptyline as this helped him to sleep and feel slightly better. Discussed possibly uptitrating zoloft, but he would like to continue with amitriptryline. He continues to have depression due to his immobility from severe COPD. He has no SI. He does not want a referral to [facility].   - stop zoloft - start amitriptyline 25 mg qd  - f/u in one month for telehealth appt, can increase to 50mg  at that time if needed.

## 2020-06-29 NOTE — Assessment & Plan Note (Addendum)
Here for f/u for yearly UDS. Tramadol was refilled a few days ago for his chronic pancreatitis, and he will pick it up today. He does continue to have pain occasionally, but the tramadol helps. Last pain contract signed 07/06/19. PDMP reviewed.   - toxassure - cont. 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 He endorses taking a very old hydrocodone that was expired when he was in a lot of pain. Discussed contents of contract and that he will need to dispose of the rest of the pills. Recommend following up in clinic if he has increased pain, and he agrees with the above.

## 2020-07-03 NOTE — Progress Notes (Signed)
Internal Medicine Clinic Attending  Case discussed with Dr. Seawell  At the time of the visit.  We reviewed the resident's history and exam and pertinent patient test results.  I agree with the assessment, diagnosis, and plan of care documented in the resident's note.  

## 2020-07-04 ENCOUNTER — Other Ambulatory Visit: Payer: Self-pay | Admitting: Internal Medicine

## 2020-07-04 LAB — TOXASSURE SELECT,+ANTIDEPR,UR

## 2020-07-14 DIAGNOSIS — L400 Psoriasis vulgaris: Secondary | ICD-10-CM | POA: Diagnosis not present

## 2020-07-17 ENCOUNTER — Ambulatory Visit: Payer: Medicaid Other | Admitting: Pulmonary Disease

## 2020-07-17 ENCOUNTER — Other Ambulatory Visit (HOSPITAL_COMMUNITY): Payer: Self-pay | Admitting: Dermatology

## 2020-07-17 ENCOUNTER — Encounter: Payer: Self-pay | Admitting: Pulmonary Disease

## 2020-07-17 ENCOUNTER — Other Ambulatory Visit: Payer: Self-pay

## 2020-07-17 VITALS — BP 110/70 | HR 97 | Temp 97.0°F | Ht 69.0 in | Wt 119.6 lb

## 2020-07-17 DIAGNOSIS — J9611 Chronic respiratory failure with hypoxia: Secondary | ICD-10-CM | POA: Diagnosis not present

## 2020-07-17 DIAGNOSIS — J439 Emphysema, unspecified: Secondary | ICD-10-CM

## 2020-07-17 DIAGNOSIS — J432 Centrilobular emphysema: Secondary | ICD-10-CM | POA: Diagnosis not present

## 2020-07-17 DIAGNOSIS — L400 Psoriasis vulgaris: Secondary | ICD-10-CM | POA: Diagnosis not present

## 2020-07-17 NOTE — Patient Instructions (Addendum)
Will arrange for home oxygen set up at 2 liters at rest and at night, and 3 liters while doing activities  Follow up in 4 months

## 2020-07-17 NOTE — Progress Notes (Signed)
[name]  Chief Complaint  Patient presents with  . Follow-up    Shortness of breath with activity    Constitutional:  BP 110/70 (BP Location: Left Arm, Cuff Size: Normal)   Pulse 97   Temp (!) 97 F (36.1 C) (Other (Comment)) Comment (Src): wrist  Ht 5\' 9"  (1.753 m)   Wt 119 lb 9.6 oz (54.3 kg)   SpO2 97% Comment: room air  BMI 17.66 kg/m   Past Medical History:  Chronic pancreatitis, Depression, ETOH, HLD, Psoriasis, PUD, Rt PTX  Past Surgical History:  He  has a past surgical history that includes Laparoscopic cholecystectomy single site with intraoperative cholangiogram (N/A, 11/28/2016); Liver biopsy (N/A, 11/28/2016); Laparoscopic cholecystectomy (11/28/2016); Tympanostomy tube placement (Bilateral, ~ 1970); and Esophagogastroduodenoscopy (egd) with propofol (N/A, 12/04/2016).  Brief Summary:  [name] is a 60 y.o. male former smoker with COPD and bullous emphysema.      Subjective:   Saw 60 several times since I last saw him.  He was seen at [facility] for transplant, but logistically couldn't meet the requirements to live in [facility].  He was seen by Dr. [place] with [facility] and advised that lung resection wasn't an option either.  He gets winded with minimal activity.  Has occasional cough with clear to yellow sputum.  Not having wheeze, chest pain, or leg swelling.  SpO2 dropped to 87% on room air after walking one lap.  He recovered to 90% on 2 liters, but only was able to walk about 50 feet before asking to stop.  Physical Exam:   Appearance - thin  ENMT - no sinus tenderness, no oral exudate, no LAN, Mallampati 3 airway, no stridor, edentulous  Respiratory - decreased breath sounds bilaterally, no wheezing or rales  CV - s1s2 regular rate and rhythm, no murmurs  Ext - no clubbing, no edema  Skin - no rashes  Psych - normal mood and affect   Pulmonary testing:   PFT 07/14/17 >> FEV1 1.69 (48%),  FEV1% 41, TLC 7.09 (107%), RV 3.41 (168%), DLCO 32%  A1AT 05/27/18 >> 192, MM  ONO with RA 06/09/18 >>test time 8 hrs 59 min. Baseline SpO2 95%, low SpO2 90%  Chest Imaging:   CT angio chest 05/22/18 >> severe emphysema, moderate bullous area Rt upper lung, 2 mm nodule RLL, 7 mm nodule in lingula, 21 mm nodule LLL  CT chest 09/09/18 >> bullous emphysema, mod/severe centrilobular emphysema, diffuse bronchial wall thickening, resolved nodule LLL, new 6 mm solid nodule LUL, new bandlike consolidation RUP  CT chest 03/10/19 >> LUL nodule resolved, stable 3 mm nodule RUL  Sleep Tests:    Cardiac Tests:   Echo 10/11/16 >> EF 69%, grade 1 DD  Social History:  He  reports that he quit smoking about 2 years ago. His smoking use included cigarettes. He has a 60.00 pack-year smoking history. He has never used smokeless tobacco. He reports that he does not drink alcohol and does not use drugs.  Family History:  His family history includes Cancer in his father; Diabetes in his mother; Heart disease in his father.     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 - wasn't able to meet criteria for lung transplant due to financial constraints and requirement to live closer to transplant center - seen by thoracic surgery and not candidate for lung volume reduction - discussed natural history of emphysema and explained that he  will always have some symptoms and goal of therapy is to minimize symptoms as able - continue yupelri, perforomist, and pulmicort - prn albuterol  Chronic respiratory failure with hypoxia. - will arrange for home oxygen set up at 2 liters at rest and with sleep, and 3 liters with exertion - goal SpO2 > 90%  Psoriasis. - he is on MTX with dermatology from [facility]  Time Spent Involved in Patient Care on Day of Examination:  32 minutes  Follow up:  Patient Instructions  Will arrange for home oxygen set up at 2 liters  at rest and at night, and 3 liters while doing activities  Follow up in 4 months   Medication List:   Allergies as of 07/17/2020      Reactions   Nsaids Other (See Comments)   Bleeding gastritis      Medication List       Accurate as of July 17, 2020 12:41 PM. If you have any questions, ask your nurse or doctor.        albuterol 108 (90 Base) MCG/ACT inhaler Commonly known as: VENTOLIN HFA INHALE 2 PUFFS INTO THE LUNGS EVERY 6 (SIX) HOURS AS NEEDED FOR WHEEZING OR SHORTNESS OF BREATH.   albuterol (2.5 MG/3ML) 0.083% nebulizer solution Commonly known as: PROVENTIL Take 3 mLs (2.5 mg total) by nebulization every 6 (six) hours as needed for wheezing or shortness of breath.   amitriptyline 25 MG tablet Commonly known as: ELAVIL Take 1 tablet (25 mg total) by mouth at bedtime.   formoterol 20 MCG/2ML nebulizer solution Commonly known as: Perforomist TAKE 2 MLS (20 MCG TOTAL) BY NEBULIZATION 2 (TWO) TIMES DAILY.   formoterol 20 MCG/2ML nebulizer solution Commonly known as: Perforomist Take 2 mLs (20 mcg total) by nebulization 2 (two) times daily.   Pulmicort 0.5 MG/2ML nebulizer solution Generic drug: budesonide INHALE 1 VIAL BY NEBULIZATION 2 TIMES DAILY.   traMADol 50 MG tablet Commonly known as: ULTRAM Take 2 tablets (100 mg total) by mouth every 8 (eight) hours as needed.   Yupelri 175 MCG/3ML nebulizer solution Generic drug: revefenacin TAKE 175 MCG ( ) BY NEBULIZATION DAILY.   Yupelri 175 MCG/3ML nebulizer solution Generic drug: revefenacin Take 3 mLs (175 mcg total) by nebulization daily.       Signature:  Coralyn Helling, MD [facility]monary/Critical Care Pager - [phone] 07/17/2020, 12:41 PM

## 2020-07-18 ENCOUNTER — Other Ambulatory Visit: Payer: Self-pay | Admitting: Internal Medicine

## 2020-07-18 ENCOUNTER — Telehealth: Payer: Self-pay | Admitting: Pulmonary Disease

## 2020-07-18 DIAGNOSIS — K860 Alcohol-induced chronic pancreatitis: Secondary | ICD-10-CM

## 2020-07-18 DIAGNOSIS — J430 Unilateral pulmonary emphysema [MacLeod's syndrome]: Secondary | ICD-10-CM

## 2020-07-18 NOTE — Telephone Encounter (Signed)
I have new O2 order.  Holding for VS to sign and then will send to [facility].  Nothing further needed for message.

## 2020-07-18 NOTE — Telephone Encounter (Signed)
Put in new oxygen template for order Will route to [facility] for further follow up

## 2020-07-18 NOTE — Addendum Note (Signed)
Addended by: Pilar Grammes on: 07/18/2020 04:20 PM   Modules accepted: Orders

## 2020-07-20 DIAGNOSIS — J432 Centrilobular emphysema: Secondary | ICD-10-CM | POA: Diagnosis not present

## 2020-07-20 DIAGNOSIS — J9611 Chronic respiratory failure with hypoxia: Secondary | ICD-10-CM | POA: Diagnosis not present

## 2020-08-18 ENCOUNTER — Other Ambulatory Visit: Payer: Self-pay | Admitting: Internal Medicine

## 2020-08-18 DIAGNOSIS — J430 Unilateral pulmonary emphysema [MacLeod's syndrome]: Secondary | ICD-10-CM

## 2020-08-18 DIAGNOSIS — K860 Alcohol-induced chronic pancreatitis: Secondary | ICD-10-CM

## 2020-08-20 DIAGNOSIS — J9611 Chronic respiratory failure with hypoxia: Secondary | ICD-10-CM | POA: Diagnosis not present

## 2020-08-20 DIAGNOSIS — J432 Centrilobular emphysema: Secondary | ICD-10-CM | POA: Diagnosis not present

## 2020-08-21 ENCOUNTER — Other Ambulatory Visit: Payer: Self-pay | Admitting: Internal Medicine

## 2020-08-25 ENCOUNTER — Telehealth: Payer: Self-pay | Admitting: *Deleted

## 2020-08-25 NOTE — Telephone Encounter (Addendum)
Information faxed to CoverMyMeds for PA foe Tramadol.  Awaiting determination.  Angelina Ok, RN 08/25/2020 9:09 AM. PA for Tramadol 50 mg tablets was approved 08/25/2020 thru 02/21/2021.  Valentina Gu 08/29/2020 2:36 PM.

## 2020-09-11 ENCOUNTER — Other Ambulatory Visit: Payer: Self-pay | Admitting: Pulmonary Disease

## 2020-09-17 DIAGNOSIS — J432 Centrilobular emphysema: Secondary | ICD-10-CM | POA: Diagnosis not present

## 2020-09-17 DIAGNOSIS — J9611 Chronic respiratory failure with hypoxia: Secondary | ICD-10-CM | POA: Diagnosis not present

## 2020-09-19 ENCOUNTER — Other Ambulatory Visit: Payer: Self-pay | Admitting: Student

## 2020-09-19 ENCOUNTER — Other Ambulatory Visit: Payer: Self-pay | Admitting: *Deleted

## 2020-09-19 DIAGNOSIS — K860 Alcohol-induced chronic pancreatitis: Secondary | ICD-10-CM

## 2020-09-19 DIAGNOSIS — J430 Unilateral pulmonary emphysema [MacLeod's syndrome]: Secondary | ICD-10-CM

## 2020-09-19 MED ORDER — TRAMADOL HCL 50 MG PO TABS: ORAL_TABLET | ORAL | 0 refills | Status: DC

## 2020-09-19 NOTE — Telephone Encounter (Signed)
[facility] Pharmacist called in requesting refill on tramadol for patient.

## 2020-09-20 ENCOUNTER — Other Ambulatory Visit: Payer: Self-pay | Admitting: Internal Medicine

## 2020-09-20 MED ORDER — TRAMADOL HCL 50 MG PO TABS: ORAL_TABLET | ORAL | 0 refills | Status: DC

## 2020-09-20 NOTE — Telephone Encounter (Signed)
Call from pharmacy- they received tramadol rx, but provider not covered by medicaid.Marland Kitchen will route to attending MD to resend rx to pharmacy

## 2020-09-20 NOTE — Addendum Note (Signed)
Addended by: Maura Crandall on: 09/20/2020 09:02 AM   Modules accepted: Orders

## 2020-09-25 ENCOUNTER — Other Ambulatory Visit (HOSPITAL_COMMUNITY): Payer: Self-pay

## 2020-10-05 DIAGNOSIS — L400 Psoriasis vulgaris: Secondary | ICD-10-CM | POA: Diagnosis not present

## 2020-10-10 ENCOUNTER — Other Ambulatory Visit (HOSPITAL_COMMUNITY): Payer: Self-pay

## 2020-10-10 ENCOUNTER — Other Ambulatory Visit: Payer: Self-pay | Admitting: Pulmonary Disease

## 2020-10-10 MED ORDER — YUPELRI 175 MCG/3ML IN SOLN
175.0000 ug | Freq: Every day | RESPIRATORY_TRACT | 3 refills | Status: DC
  Filled 2020-10-10: qty 90, 30d supply, fill #0
  Filled 2020-11-07: qty 90, 30d supply, fill #1
  Filled 2020-12-01: qty 90, 30d supply, fill #2
  Filled 2021-01-01: qty 90, 30d supply, fill #3

## 2020-10-10 MED FILL — Albuterol Sulfate Inhal Aero 108 MCG/ACT (90MCG Base Equiv): RESPIRATORY_TRACT | 25 days supply | Qty: 8.5 | Fill #0 | Status: AC

## 2020-10-10 MED FILL — Budesonide Inhalation Susp 0.5 MG/2ML: RESPIRATORY_TRACT | 30 days supply | Qty: 120 | Fill #0 | Status: AC

## 2020-10-11 ENCOUNTER — Other Ambulatory Visit (HOSPITAL_COMMUNITY): Payer: Self-pay

## 2020-10-16 ENCOUNTER — Other Ambulatory Visit: Payer: Self-pay | Admitting: Internal Medicine

## 2020-10-16 ENCOUNTER — Other Ambulatory Visit (HOSPITAL_COMMUNITY): Payer: Self-pay

## 2020-10-16 DIAGNOSIS — J430 Unilateral pulmonary emphysema [MacLeod's syndrome]: Secondary | ICD-10-CM

## 2020-10-16 DIAGNOSIS — K860 Alcohol-induced chronic pancreatitis: Secondary | ICD-10-CM

## 2020-10-16 DIAGNOSIS — L400 Psoriasis vulgaris: Secondary | ICD-10-CM | POA: Diagnosis not present

## 2020-10-16 MED ORDER — METHOTREXATE SODIUM 2.5 MG PO TABS
2.5000 mg | ORAL_TABLET | Freq: Two times a day (BID) | ORAL | 2 refills | Status: DC
  Filled 2020-10-16: qty 12, 28d supply, fill #0
  Filled 2020-11-13: qty 12, 28d supply, fill #1
  Filled 2020-12-11: qty 12, 28d supply, fill #2

## 2020-10-16 MED ORDER — HYDROXYZINE HCL 25 MG PO TABS
ORAL_TABLET | ORAL | 2 refills | Status: DC
  Filled 2020-10-16: qty 100, 34d supply, fill #0
  Filled 2020-11-13 – 2020-11-14 (×3): qty 100, 34d supply, fill #1
  Filled 2020-12-11 (×3): qty 100, 34d supply, fill #2

## 2020-10-16 MED ORDER — TRAMADOL HCL 50 MG PO TABS
100.0000 mg | ORAL_TABLET | Freq: Four times a day (QID) | ORAL | 0 refills | Status: DC | PRN
  Filled 2020-10-16 – 2020-11-21 (×2): qty 180, 23d supply, fill #0

## 2020-10-16 MED ORDER — TRAMADOL HCL 50 MG PO TABS
50.0000 mg | ORAL_TABLET | Freq: Four times a day (QID) | ORAL | 0 refills | Status: DC | PRN
  Filled 2020-10-16: qty 180, 45d supply, fill #0

## 2020-10-16 MED ORDER — TRAMADOL HCL 50 MG PO TABS
100.0000 mg | ORAL_TABLET | Freq: Four times a day (QID) | ORAL | 0 refills | Status: AC | PRN
  Filled 2020-10-16 – 2020-10-23 (×2): qty 180, 23d supply, fill #0
  Filled ????-??-??: fill #0

## 2020-10-16 MED FILL — Amitriptyline HCl Tab 25 MG: ORAL | 30 days supply | Qty: 30 | Fill #0 | Status: AC

## 2020-10-17 ENCOUNTER — Other Ambulatory Visit: Payer: Self-pay | Admitting: Internal Medicine

## 2020-10-17 ENCOUNTER — Other Ambulatory Visit (HOSPITAL_COMMUNITY): Payer: Self-pay

## 2020-10-17 DIAGNOSIS — K860 Alcohol-induced chronic pancreatitis: Secondary | ICD-10-CM

## 2020-10-17 MED FILL — Formoterol Fumarate Soln Nebu 20 MCG/2ML: RESPIRATORY_TRACT | 30 days supply | Qty: 120 | Fill #0 | Status: AC

## 2020-10-17 MED FILL — Formoterol Fumarate Soln Nebu 20 MCG/2ML: RESPIRATORY_TRACT | 30 days supply | Qty: 120 | Fill #0 | Status: CN

## 2020-10-18 DIAGNOSIS — J432 Centrilobular emphysema: Secondary | ICD-10-CM | POA: Diagnosis not present

## 2020-10-18 DIAGNOSIS — J9611 Chronic respiratory failure with hypoxia: Secondary | ICD-10-CM | POA: Diagnosis not present

## 2020-10-23 ENCOUNTER — Other Ambulatory Visit (HOSPITAL_COMMUNITY): Payer: Self-pay

## 2020-10-31 ENCOUNTER — Telehealth: Payer: Self-pay

## 2020-10-31 ENCOUNTER — Other Ambulatory Visit (HOSPITAL_COMMUNITY): Payer: Self-pay

## 2020-10-31 ENCOUNTER — Other Ambulatory Visit: Payer: Self-pay | Admitting: Internal Medicine

## 2020-10-31 MED ORDER — TRAMADOL HCL 50 MG PO TABS
100.0000 mg | ORAL_TABLET | Freq: Four times a day (QID) | ORAL | 0 refills | Status: DC | PRN
  Filled 2020-10-31 – 2020-12-21 (×3): qty 180, 23d supply, fill #0

## 2020-10-31 NOTE — Telephone Encounter (Signed)
Received TC from Adrianne at [facility].  She states they received 3 month RX for patients Tramadol on 10/16/20.  2 of 3 RX's state to take 2 tabs Q6 hours, but 3 of 3 of the RX states to take only 1 tab Q 6 hours.  She is requesting clarification and to resend the RX. Thank you, SChaplin, RN,BSN

## 2020-10-31 NOTE — Telephone Encounter (Signed)
Patient should continue the 2 tablets q6h PRN. I have sent an updated script. Thank you!

## 2020-11-07 ENCOUNTER — Other Ambulatory Visit (HOSPITAL_COMMUNITY): Payer: Self-pay

## 2020-11-11 ENCOUNTER — Encounter: Payer: Self-pay | Admitting: *Deleted

## 2020-11-13 ENCOUNTER — Other Ambulatory Visit (HOSPITAL_COMMUNITY): Payer: Self-pay

## 2020-11-13 MED FILL — Amitriptyline HCl Tab 25 MG: ORAL | 30 days supply | Qty: 30 | Fill #1 | Status: CN

## 2020-11-13 MED FILL — Budesonide Inhalation Susp 0.5 MG/2ML: RESPIRATORY_TRACT | 30 days supply | Qty: 120 | Fill #1 | Status: AC

## 2020-11-13 MED FILL — Amitriptyline HCl Tab 25 MG: ORAL | 30 days supply | Qty: 30 | Fill #1 | Status: AC

## 2020-11-14 ENCOUNTER — Other Ambulatory Visit (HOSPITAL_COMMUNITY): Payer: Self-pay

## 2020-11-17 ENCOUNTER — Other Ambulatory Visit (HOSPITAL_COMMUNITY): Payer: Self-pay

## 2020-11-17 DIAGNOSIS — J9611 Chronic respiratory failure with hypoxia: Secondary | ICD-10-CM | POA: Diagnosis not present

## 2020-11-17 DIAGNOSIS — J432 Centrilobular emphysema: Secondary | ICD-10-CM | POA: Diagnosis not present

## 2020-11-21 ENCOUNTER — Other Ambulatory Visit (HOSPITAL_COMMUNITY): Payer: Self-pay

## 2020-11-21 MED FILL — Formoterol Fumarate Soln Nebu 20 MCG/2ML: RESPIRATORY_TRACT | 30 days supply | Qty: 120 | Fill #1 | Status: AC

## 2020-11-24 ENCOUNTER — Other Ambulatory Visit (HOSPITAL_COMMUNITY): Payer: Self-pay

## 2020-12-01 ENCOUNTER — Other Ambulatory Visit (HOSPITAL_COMMUNITY): Payer: Self-pay

## 2020-12-11 ENCOUNTER — Other Ambulatory Visit: Payer: Self-pay | Admitting: Pulmonary Disease

## 2020-12-11 ENCOUNTER — Other Ambulatory Visit (HOSPITAL_COMMUNITY): Payer: Self-pay

## 2020-12-11 MED FILL — Amitriptyline HCl Tab 25 MG: ORAL | 30 days supply | Qty: 30 | Fill #2 | Status: AC

## 2020-12-12 MED ORDER — PULMICORT 0.5 MG/2ML IN SUSP
RESPIRATORY_TRACT | 11 refills | Status: DC
  Filled 2020-12-12 – 2021-01-11 (×2): qty 120, 30d supply, fill #0
  Filled 2021-02-19: qty 120, 30d supply, fill #1
  Filled 2021-03-20: qty 120, 30d supply, fill #2
  Filled 2021-04-16: qty 120, 30d supply, fill #3
  Filled 2021-05-18: qty 120, 30d supply, fill #4
  Filled 2021-06-18: qty 120, 30d supply, fill #5
  Filled 2021-07-19: qty 120, 30d supply, fill #6
  Filled 2021-08-21: qty 120, 30d supply, fill #7
  Filled 2021-09-16: qty 120, 30d supply, fill #8
  Filled 2021-10-22: qty 120, 30d supply, fill #9
  Filled 2021-11-15: qty 120, 30d supply, fill #10

## 2020-12-13 ENCOUNTER — Other Ambulatory Visit (HOSPITAL_COMMUNITY): Payer: Self-pay

## 2020-12-14 ENCOUNTER — Other Ambulatory Visit (HOSPITAL_COMMUNITY): Payer: Self-pay

## 2020-12-15 ENCOUNTER — Other Ambulatory Visit: Payer: Self-pay | Admitting: Internal Medicine

## 2020-12-15 ENCOUNTER — Other Ambulatory Visit (HOSPITAL_COMMUNITY): Payer: Self-pay

## 2020-12-15 DIAGNOSIS — K860 Alcohol-induced chronic pancreatitis: Secondary | ICD-10-CM

## 2020-12-18 ENCOUNTER — Other Ambulatory Visit (HOSPITAL_COMMUNITY): Payer: Self-pay

## 2020-12-18 DIAGNOSIS — J432 Centrilobular emphysema: Secondary | ICD-10-CM | POA: Diagnosis not present

## 2020-12-18 DIAGNOSIS — J9611 Chronic respiratory failure with hypoxia: Secondary | ICD-10-CM | POA: Diagnosis not present

## 2020-12-19 ENCOUNTER — Other Ambulatory Visit (HOSPITAL_COMMUNITY): Payer: Self-pay

## 2020-12-19 MED FILL — Formoterol Fumarate Soln Nebu 20 MCG/2ML: RESPIRATORY_TRACT | 30 days supply | Qty: 120 | Fill #2 | Status: AC

## 2020-12-21 ENCOUNTER — Other Ambulatory Visit: Payer: Self-pay | Admitting: *Deleted

## 2020-12-21 ENCOUNTER — Other Ambulatory Visit (HOSPITAL_COMMUNITY): Payer: Self-pay

## 2020-12-21 DIAGNOSIS — K860 Alcohol-induced chronic pancreatitis: Secondary | ICD-10-CM

## 2020-12-21 MED ORDER — TRAMADOL HCL 50 MG PO TABS
100.0000 mg | ORAL_TABLET | Freq: Three times a day (TID) | ORAL | 0 refills | Status: DC | PRN
  Filled 2020-12-21: qty 180, 30d supply, fill #0

## 2020-12-21 NOTE — Telephone Encounter (Signed)
Call from pharmacy States last couple tramadol refills were written by physician that's not approved by medicaid. Requests new rx   Will send to attending.Samuel Spittle Cassady6/30/20224:20 PM

## 2020-12-21 NOTE — Telephone Encounter (Signed)
Reviewed prior notes, PDMP, and medication history. Looks like there is discrepancy between tramadol dose instructions and what is documented in the note. He has been receiving 180 tablets of 50 mg tramadol per month. According to the notes, he should be taking 100 mg (2 tablets) TID. But the prescription is written for every 6 hours. The PDMP has reported his last two prescriptions as 23 day supply.   I have updated his prescription to reflect the notes, and changed to 100 mg tablets with instructions to take 1 tablet every 8 hours PRN. Dispense #90. Please let the patient and pharmacy know. Please have him schedule follow up in 1 month. He needs repeat Utox.

## 2020-12-22 ENCOUNTER — Other Ambulatory Visit (HOSPITAL_COMMUNITY): Payer: Self-pay

## 2021-01-01 ENCOUNTER — Other Ambulatory Visit (HOSPITAL_COMMUNITY): Payer: Self-pay

## 2021-01-02 ENCOUNTER — Other Ambulatory Visit (HOSPITAL_COMMUNITY): Payer: Self-pay

## 2021-01-11 ENCOUNTER — Other Ambulatory Visit (HOSPITAL_COMMUNITY): Payer: Self-pay

## 2021-01-11 ENCOUNTER — Other Ambulatory Visit: Payer: Self-pay | Admitting: Internal Medicine

## 2021-01-11 DIAGNOSIS — F32A Depression, unspecified: Secondary | ICD-10-CM

## 2021-01-11 DIAGNOSIS — K860 Alcohol-induced chronic pancreatitis: Secondary | ICD-10-CM

## 2021-01-11 DIAGNOSIS — L400 Psoriasis vulgaris: Secondary | ICD-10-CM | POA: Diagnosis not present

## 2021-01-11 MED ORDER — TRAMADOL HCL 50 MG PO TABS
100.0000 mg | ORAL_TABLET | Freq: Three times a day (TID) | ORAL | 0 refills | Status: DC | PRN
  Filled 2021-01-11 – 2021-01-19 (×2): qty 180, 30d supply, fill #0
  Filled 2021-01-19: qty 150, 25d supply, fill #0
  Filled 2021-01-19: qty 180, 30d supply, fill #0
  Filled 2021-01-19: qty 30, 5d supply, fill #0
  Filled 2021-01-23: qty 180, 30d supply, fill #0

## 2021-01-11 NOTE — Telephone Encounter (Signed)
Next appt scheduled 7/28 with Dr Huel Cote.

## 2021-01-12 ENCOUNTER — Other Ambulatory Visit (HOSPITAL_COMMUNITY): Payer: Self-pay

## 2021-01-12 MED ORDER — AMITRIPTYLINE HCL 25 MG PO TABS
25.0000 mg | ORAL_TABLET | Freq: Every day | ORAL | 5 refills | Status: DC
  Filled 2021-01-12: qty 30, 30d supply, fill #0

## 2021-01-15 ENCOUNTER — Other Ambulatory Visit (HOSPITAL_COMMUNITY): Payer: Self-pay

## 2021-01-15 DIAGNOSIS — L400 Psoriasis vulgaris: Secondary | ICD-10-CM | POA: Diagnosis not present

## 2021-01-15 MED ORDER — HYDROXYZINE HCL 25 MG PO TABS
25.0000 mg | ORAL_TABLET | Freq: Three times a day (TID) | ORAL | 2 refills | Status: DC | PRN
  Filled 2021-01-15: qty 100, 33d supply, fill #0
  Filled 2021-02-12: qty 100, 33d supply, fill #1
  Filled 2021-03-13: qty 100, 33d supply, fill #2

## 2021-01-15 MED ORDER — METHOTREXATE SODIUM 2.5 MG PO TABS
ORAL_TABLET | ORAL | 2 refills | Status: DC
  Filled 2021-01-15: qty 12, 28d supply, fill #0
  Filled 2021-02-12: qty 12, 28d supply, fill #1
  Filled 2021-03-13: qty 12, 28d supply, fill #2

## 2021-01-17 DIAGNOSIS — J432 Centrilobular emphysema: Secondary | ICD-10-CM | POA: Diagnosis not present

## 2021-01-17 DIAGNOSIS — J9611 Chronic respiratory failure with hypoxia: Secondary | ICD-10-CM | POA: Diagnosis not present

## 2021-01-18 ENCOUNTER — Ambulatory Visit: Payer: Medicaid Other | Admitting: Internal Medicine

## 2021-01-18 ENCOUNTER — Other Ambulatory Visit: Payer: Self-pay

## 2021-01-18 ENCOUNTER — Encounter: Payer: Self-pay | Admitting: Internal Medicine

## 2021-01-18 ENCOUNTER — Other Ambulatory Visit (HOSPITAL_COMMUNITY): Payer: Self-pay

## 2021-01-18 VITALS — BP 139/82 | HR 121 | Temp 98.7°F | Ht 69.0 in | Wt 122.4 lb

## 2021-01-18 DIAGNOSIS — F32A Depression, unspecified: Secondary | ICD-10-CM

## 2021-01-18 DIAGNOSIS — K860 Alcohol-induced chronic pancreatitis: Secondary | ICD-10-CM

## 2021-01-18 DIAGNOSIS — F10929 Alcohol use, unspecified with intoxication, unspecified: Secondary | ICD-10-CM | POA: Diagnosis not present

## 2021-01-18 MED ORDER — AMITRIPTYLINE HCL 50 MG PO TABS
50.0000 mg | ORAL_TABLET | Freq: Every day | ORAL | 5 refills | Status: DC
  Filled 2021-01-18: qty 30, 30d supply, fill #0
  Filled 2021-02-12: qty 30, 30d supply, fill #1
  Filled 2021-03-13: qty 30, 30d supply, fill #2
  Filled 2021-04-09: qty 30, 30d supply, fill #3
  Filled 2021-05-08: qty 30, 30d supply, fill #4
  Filled 2021-06-05: qty 30, 30d supply, fill #5

## 2021-01-18 NOTE — Patient Instructions (Signed)
It was nice seeing you today! Thank you for choosing [facility] for your Primary Care.    Today we talked about:   Tramadol has been refilled Amitriptyline dose has been increased to 50 mg at bedtime. The new prescription will be at the higher dose, so you will only need 1 tablet.  I will message Dr. Craige Cotta about [facility].

## 2021-01-18 NOTE — Progress Notes (Signed)
   CC: Chronic Pancreatitis   HPI:  [name] is a 60 y.o. with a PMHx as listed below who presents to the clinic for Chronic Pancreatitis .   Please see the Encounters tab for problem-based Assessment & Plan regarding status of patient's acute and chronic conditions.  Past Medical History:  Diagnosis Date   Chronic pancreatitis (HCC)    Depression    Duodenitis    by EGD 1/07   Emphysema    unclear diagnosis noted in previous EMR   History of alcohol abuse    Hyperlipidemia    Psoriasis    extensive, responds to strong steroids, unable to start molecular therapies due to cost   RUE weakness    2/2 cervical spondylosis   Stomach ulcer    Review of Systems: Review of Systems  Constitutional:  Positive for malaise/fatigue and weight loss. Negative for chills and fever.  Respiratory:  Positive for shortness of breath (chronic).   Gastrointestinal:  Positive for abdominal pain (chronic). Negative for nausea and vomiting.  Psychiatric/Behavioral:  The patient has insomnia.    Physical Exam:  Vitals:   01/18/21 1349  BP: 139/82  Pulse: (!) 121  Temp: 98.7 F (37.1 C)  TempSrc: Oral  SpO2: 100%  Weight: 122 lb 6.4 oz (55.5 kg)  Height: 5\' 9"  (1.753 m)   Physical Exam Vitals and nursing note reviewed.  Constitutional:      Appearance: He is underweight. He is ill-appearing (chronically).  HENT:     Head: Normocephalic and atraumatic.  Skin:    General: Skin is warm and dry.  Neurological:     General: No focal deficit present.     Mental Status: He is alert and oriented to person, place, and time.  Psychiatric:        Mood and Affect: Mood normal.        Behavior: Behavior normal. Behavior is cooperative.        Thought Content: Thought content normal.        Judgment: Judgment normal.    Assessment & Plan:   See Encounters Tab for problem based charting.  Patient discussed with Dr. 

## 2021-01-19 ENCOUNTER — Other Ambulatory Visit: Payer: Self-pay | Admitting: Pulmonary Disease

## 2021-01-19 ENCOUNTER — Other Ambulatory Visit (HOSPITAL_COMMUNITY): Payer: Self-pay

## 2021-01-19 MED ORDER — FORMOTEROL FUMARATE 20 MCG/2ML IN NEBU
INHALATION_SOLUTION | RESPIRATORY_TRACT | 5 refills | Status: DC
  Filled 2021-01-19 – 2021-01-22 (×2): qty 120, 30d supply, fill #0
  Filled 2021-02-19 (×2): qty 120, 30d supply, fill #1
  Filled 2021-03-20: qty 120, 30d supply, fill #2
  Filled 2021-04-16: qty 120, 30d supply, fill #3
  Filled 2021-05-21: qty 120, 30d supply, fill #4
  Filled 2021-06-18: qty 120, 30d supply, fill #5

## 2021-01-19 NOTE — Assessment & Plan Note (Signed)
[name] states that his chronic pain secondary to chronic pancreatitis is controlled well enough right now with tramadol.  He states that the tramadol lowers his pain level enough to where he can function but does not resolve it all the way.  He does feel his pain is not adequately controlled at night occasionally, but knows not to take extra Tramadol.   Assessment/plan: - Tramadol refill already sent to pharmacy earlier this month - Repeat tox assure today - No other changes in management

## 2021-01-19 NOTE — Progress Notes (Signed)
Internal Medicine Clinic Attending  Case discussed with Dr. Basaraba  At the time of the visit.  We reviewed the resident's history and exam and pertinent patient test results.  I agree with the assessment, diagnosis, and plan of care documented in the resident's note.  

## 2021-01-19 NOTE — Assessment & Plan Note (Signed)
[name] states that he continues to have insomnia.  He notes that the amitriptyline helps somewhat but feels he would benefit from a higher dose.  He was not aware amitriptyline was being used to treat his depression.  He feels like his mood has been stable in light of his severe chronic illnesses.  Assessment/plan: We will go ahead and increase his amitriptyline today to 50 mg.  - Increase amitriptyline from 25 to 50 mg daily at bedtime

## 2021-01-22 ENCOUNTER — Other Ambulatory Visit (HOSPITAL_COMMUNITY): Payer: Self-pay

## 2021-01-23 ENCOUNTER — Other Ambulatory Visit (HOSPITAL_COMMUNITY): Payer: Self-pay

## 2021-01-24 LAB — TOXASSURE SELECT,+ANTIDEPR,UR

## 2021-01-25 NOTE — Progress Notes (Signed)
Toxassure is appropriate

## 2021-02-07 ENCOUNTER — Other Ambulatory Visit: Payer: Self-pay | Admitting: Pulmonary Disease

## 2021-02-07 ENCOUNTER — Ambulatory Visit: Payer: Medicaid Other | Admitting: Pulmonary Disease

## 2021-02-08 ENCOUNTER — Other Ambulatory Visit: Payer: Self-pay | Admitting: Pulmonary Disease

## 2021-02-08 ENCOUNTER — Other Ambulatory Visit (HOSPITAL_COMMUNITY): Payer: Self-pay

## 2021-02-08 ENCOUNTER — Telehealth: Payer: Self-pay | Admitting: Pulmonary Disease

## 2021-02-08 MED ORDER — YUPELRI 175 MCG/3ML IN SOLN
175.0000 ug | Freq: Every day | RESPIRATORY_TRACT | 3 refills | Status: DC
  Filled 2021-02-08: qty 90, 30d supply, fill #0
  Filled 2021-02-27 – 2021-03-06 (×3): qty 90, 30d supply, fill #1
  Filled 2021-04-09: qty 90, 30d supply, fill #2
  Filled 2021-05-08: qty 90, 30d supply, fill #3

## 2021-02-08 NOTE — Telephone Encounter (Signed)
Pts mom stated that he is needing a refill for the medication; Yupelri. Stated that there was no more refills.   Pharmacy; [facility]  Pls regard; [phone]

## 2021-02-08 NOTE — Telephone Encounter (Signed)
Refill of the Mikael Spray has been sent to the pharmacy  added a note that the pt will need OV for further refills.  Nothing further needed.

## 2021-02-09 ENCOUNTER — Other Ambulatory Visit (HOSPITAL_COMMUNITY): Payer: Self-pay

## 2021-02-12 ENCOUNTER — Other Ambulatory Visit: Payer: Self-pay | Admitting: Internal Medicine

## 2021-02-12 ENCOUNTER — Other Ambulatory Visit (HOSPITAL_COMMUNITY): Payer: Self-pay

## 2021-02-12 DIAGNOSIS — K860 Alcohol-induced chronic pancreatitis: Secondary | ICD-10-CM

## 2021-02-12 NOTE — Telephone Encounter (Signed)
Last ov 01/18/21

## 2021-02-13 ENCOUNTER — Other Ambulatory Visit (HOSPITAL_COMMUNITY): Payer: Self-pay

## 2021-02-13 MED ORDER — TRAMADOL HCL 50 MG PO TABS
100.0000 mg | ORAL_TABLET | Freq: Three times a day (TID) | ORAL | 0 refills | Status: DC | PRN
  Filled 2021-02-22: qty 180, 30d supply, fill #0

## 2021-02-14 ENCOUNTER — Other Ambulatory Visit (HOSPITAL_COMMUNITY): Payer: Self-pay

## 2021-02-17 DIAGNOSIS — J432 Centrilobular emphysema: Secondary | ICD-10-CM | POA: Diagnosis not present

## 2021-02-17 DIAGNOSIS — J9611 Chronic respiratory failure with hypoxia: Secondary | ICD-10-CM | POA: Diagnosis not present

## 2021-02-19 ENCOUNTER — Other Ambulatory Visit (HOSPITAL_COMMUNITY): Payer: Self-pay

## 2021-02-19 MED FILL — Albuterol Sulfate Inhal Aero 108 MCG/ACT (90MCG Base Equiv): RESPIRATORY_TRACT | 25 days supply | Qty: 8.5 | Fill #0 | Status: CN

## 2021-02-19 MED FILL — Albuterol Sulfate Inhal Aero 108 MCG/ACT (90MCG Base Equiv): RESPIRATORY_TRACT | 25 days supply | Qty: 8.5 | Fill #0 | Status: AC

## 2021-02-19 MED FILL — Albuterol Sulfate Inhal Aero 108 MCG/ACT (90MCG Base Equiv): RESPIRATORY_TRACT | 25 days supply | Qty: 8.5 | Fill #1 | Status: CN

## 2021-02-20 ENCOUNTER — Other Ambulatory Visit (HOSPITAL_COMMUNITY): Payer: Self-pay

## 2021-02-21 ENCOUNTER — Other Ambulatory Visit (HOSPITAL_COMMUNITY): Payer: Self-pay

## 2021-02-22 ENCOUNTER — Other Ambulatory Visit (HOSPITAL_COMMUNITY): Payer: Self-pay

## 2021-02-22 ENCOUNTER — Telehealth: Payer: Self-pay

## 2021-02-22 NOTE — Telephone Encounter (Signed)
A P.A was started for pt for his Tramadol on cover my meds was sent to [facility]  02/22/21 .. awaiting approval or denial  request usually take 24-48 hours

## 2021-02-23 ENCOUNTER — Other Ambulatory Visit (HOSPITAL_COMMUNITY): Payer: Self-pay

## 2021-02-27 ENCOUNTER — Other Ambulatory Visit (HOSPITAL_COMMUNITY): Payer: Self-pay

## 2021-02-27 NOTE — Telephone Encounter (Signed)
Pa  for tramadol was approved  for 02/22/21/- 08/21/2021

## 2021-03-01 ENCOUNTER — Other Ambulatory Visit (HOSPITAL_COMMUNITY): Payer: Self-pay

## 2021-03-02 ENCOUNTER — Other Ambulatory Visit (HOSPITAL_COMMUNITY): Payer: Self-pay

## 2021-03-05 ENCOUNTER — Other Ambulatory Visit (HOSPITAL_COMMUNITY): Payer: Self-pay

## 2021-03-06 ENCOUNTER — Other Ambulatory Visit (HOSPITAL_COMMUNITY): Payer: Self-pay

## 2021-03-13 ENCOUNTER — Other Ambulatory Visit (HOSPITAL_COMMUNITY): Payer: Self-pay

## 2021-03-14 ENCOUNTER — Other Ambulatory Visit (HOSPITAL_COMMUNITY): Payer: Self-pay

## 2021-03-19 ENCOUNTER — Other Ambulatory Visit (HOSPITAL_COMMUNITY): Payer: Self-pay

## 2021-03-19 ENCOUNTER — Other Ambulatory Visit: Payer: Self-pay

## 2021-03-19 DIAGNOSIS — K860 Alcohol-induced chronic pancreatitis: Secondary | ICD-10-CM

## 2021-03-19 MED ORDER — TRAMADOL HCL 50 MG PO TABS
100.0000 mg | ORAL_TABLET | Freq: Three times a day (TID) | ORAL | 0 refills | Status: DC | PRN
  Filled 2021-03-19 – 2021-03-23 (×2): qty 180, 30d supply, fill #0

## 2021-03-19 NOTE — Telephone Encounter (Signed)
Last appt 01/18/21. No future appt. Last rx written 02/22/21.

## 2021-03-19 NOTE — Telephone Encounter (Signed)
traMADol (ULTRAM) 50 MG tablet, refill request @ [facility].

## 2021-03-20 ENCOUNTER — Other Ambulatory Visit (HOSPITAL_COMMUNITY): Payer: Self-pay

## 2021-03-20 DIAGNOSIS — J9611 Chronic respiratory failure with hypoxia: Secondary | ICD-10-CM | POA: Diagnosis not present

## 2021-03-20 DIAGNOSIS — J432 Centrilobular emphysema: Secondary | ICD-10-CM | POA: Diagnosis not present

## 2021-03-21 ENCOUNTER — Other Ambulatory Visit (HOSPITAL_COMMUNITY): Payer: Self-pay

## 2021-03-23 ENCOUNTER — Other Ambulatory Visit (HOSPITAL_COMMUNITY): Payer: Self-pay

## 2021-04-09 ENCOUNTER — Other Ambulatory Visit (HOSPITAL_COMMUNITY): Payer: Self-pay

## 2021-04-10 ENCOUNTER — Other Ambulatory Visit (HOSPITAL_COMMUNITY): Payer: Self-pay

## 2021-04-12 DIAGNOSIS — L400 Psoriasis vulgaris: Secondary | ICD-10-CM | POA: Diagnosis not present

## 2021-04-16 ENCOUNTER — Other Ambulatory Visit (HOSPITAL_COMMUNITY): Payer: Self-pay

## 2021-04-16 DIAGNOSIS — L400 Psoriasis vulgaris: Secondary | ICD-10-CM | POA: Diagnosis not present

## 2021-04-16 MED ORDER — HYDROXYZINE HCL 25 MG PO TABS
25.0000 mg | ORAL_TABLET | Freq: Three times a day (TID) | ORAL | 2 refills | Status: DC | PRN
  Filled 2021-04-16: qty 100, 34d supply, fill #0
  Filled 2021-05-18: qty 100, 34d supply, fill #1
  Filled 2021-06-18: qty 100, 34d supply, fill #2

## 2021-04-16 MED ORDER — METHOTREXATE SODIUM 2.5 MG PO TABS
2.5000 mg | ORAL_TABLET | ORAL | 2 refills | Status: DC
  Filled 2021-04-16: qty 12, 28d supply, fill #0
  Filled 2021-05-18: qty 12, 28d supply, fill #1
  Filled 2021-06-18: qty 12, 28d supply, fill #2

## 2021-04-17 ENCOUNTER — Other Ambulatory Visit (HOSPITAL_COMMUNITY): Payer: Self-pay

## 2021-04-18 ENCOUNTER — Other Ambulatory Visit (HOSPITAL_COMMUNITY): Payer: Self-pay

## 2021-04-18 ENCOUNTER — Other Ambulatory Visit: Payer: Self-pay

## 2021-04-18 DIAGNOSIS — K860 Alcohol-induced chronic pancreatitis: Secondary | ICD-10-CM

## 2021-04-18 MED ORDER — TRAMADOL HCL 50 MG PO TABS
100.0000 mg | ORAL_TABLET | Freq: Three times a day (TID) | ORAL | 0 refills | Status: DC | PRN
  Filled 2021-04-18 – 2021-04-20 (×2): qty 180, 30d supply, fill #0

## 2021-04-19 DIAGNOSIS — J9611 Chronic respiratory failure with hypoxia: Secondary | ICD-10-CM | POA: Diagnosis not present

## 2021-04-19 DIAGNOSIS — J432 Centrilobular emphysema: Secondary | ICD-10-CM | POA: Diagnosis not present

## 2021-04-20 ENCOUNTER — Other Ambulatory Visit (HOSPITAL_COMMUNITY): Payer: Self-pay

## 2021-04-26 ENCOUNTER — Encounter: Payer: Medicaid Other | Admitting: Internal Medicine

## 2021-05-07 ENCOUNTER — Other Ambulatory Visit (HOSPITAL_COMMUNITY): Payer: Self-pay

## 2021-05-07 ENCOUNTER — Encounter: Payer: Self-pay | Admitting: Internal Medicine

## 2021-05-07 ENCOUNTER — Ambulatory Visit: Payer: Medicaid Other | Admitting: Internal Medicine

## 2021-05-07 ENCOUNTER — Other Ambulatory Visit: Payer: Self-pay

## 2021-05-07 DIAGNOSIS — F10929 Alcohol use, unspecified with intoxication, unspecified: Secondary | ICD-10-CM

## 2021-05-07 DIAGNOSIS — Z0000 Encounter for general adult medical examination without abnormal findings: Secondary | ICD-10-CM | POA: Diagnosis not present

## 2021-05-07 DIAGNOSIS — K860 Alcohol-induced chronic pancreatitis: Secondary | ICD-10-CM

## 2021-05-07 DIAGNOSIS — J430 Unilateral pulmonary emphysema [MacLeod's syndrome]: Secondary | ICD-10-CM | POA: Diagnosis not present

## 2021-05-07 DIAGNOSIS — F32A Depression, unspecified: Secondary | ICD-10-CM

## 2021-05-07 MED ORDER — DULOXETINE HCL 30 MG PO CPEP
30.0000 mg | ORAL_CAPSULE | Freq: Every day | ORAL | 3 refills | Status: DC
  Filled 2021-05-07: qty 90, 90d supply, fill #0

## 2021-05-07 MED ORDER — TRAMADOL HCL 50 MG PO TABS
100.0000 mg | ORAL_TABLET | Freq: Three times a day (TID) | ORAL | 0 refills | Status: DC | PRN
Start: 2021-05-20 — End: 2021-05-21
  Filled 2021-05-21: qty 180, 30d supply, fill #0

## 2021-05-07 MED ORDER — DULOXETINE HCL 30 MG PO CPEP
30.0000 mg | ORAL_CAPSULE | Freq: Every day | ORAL | 3 refills | Status: DC
  Filled 2021-05-07: qty 90, 90d supply, fill #0
  Filled 2021-07-03: qty 90, 90d supply, fill #1

## 2021-05-07 NOTE — Progress Notes (Signed)
   CC: Routine follow-up  HPI:  [name] is a 60 y.o. with past medical history as noted below who presents to the clinic today for routine follow-up. Please see problem-based list for further details, assessments, and plans.  Past Medical History:  Diagnosis Date   Chronic pancreatitis (HCC)    Depression    Duodenitis    by EGD 1/07   Emphysema    unclear diagnosis noted in previous EMR   History of alcohol abuse    Hyperlipidemia    Psoriasis    extensive, responds to strong steroids, unable to start molecular therapies due to cost   RUE weakness    2/2 cervical spondylosis   Stomach ulcer    Review of Systems:  Review of Systems  Constitutional: Negative.   HENT: Negative.    Eyes: Negative.   Respiratory:  Positive for cough and sputum production.   Cardiovascular: Negative.   Gastrointestinal:  Positive for abdominal pain.  Genitourinary: Negative.   Skin: Negative.   Neurological: Negative.   Endo/Heme/Allergies: Negative.   Psychiatric/Behavioral:  Positive for depression. Negative for suicidal ideas. The patient does not have insomnia.     Physical Exam:  Vitals:   05/07/21 1319  BP: (!) 112/54  Pulse: (!) 107  Temp: (!) 97.5 F (36.4 C)  TempSrc: Oral  SpO2: 97%  Weight: 122 lb 8 oz (55.6 kg)  Height: 5\' 8"  (1.727 m)   Physical Exam Constitutional:      General: He is not in acute distress. HENT:     Head: Normocephalic and atraumatic.  Eyes:     Extraocular Movements: Extraocular movements intact.     Pupils: Pupils are equal, round, and reactive to light.  Cardiovascular:     Rate and Rhythm: Normal rate and regular rhythm.     Heart sounds: No murmur heard.   No friction rub. No gallop.  Pulmonary:     Effort: Pulmonary effort is normal. No respiratory distress.     Breath sounds: Normal breath sounds. No stridor. No wheezing, rhonchi or rales.  Abdominal:     General: There is no distension.     Palpations: Abdomen is soft.   Musculoskeletal:        General: No swelling or deformity.  Skin:    General: Skin is warm and dry.  Neurological:     General: No focal deficit present.     Mental Status: He is alert and oriented to person, place, and time. Mental status is at baseline.  Psychiatric:        Mood and Affect: Mood normal.        Behavior: Behavior normal.     Assessment & Plan:   See Encounters Tab for problem based charting.  Patient seen with Dr. 

## 2021-05-07 NOTE — Assessment & Plan Note (Addendum)
Patient's chronic pain is secondary to chronic pancreatitis.  He has been on tramadol 100 mg 3 times daily for this.  He states that his pain has been about the same he was last seen.  P: Per PDMP, patient is not due for refill until November 27.  I have refilled his tramadol but to only be filled on this date or later.

## 2021-05-07 NOTE — Assessment & Plan Note (Signed)
PHQ-9 of 11 today.  During patient's last visit in July, his amitriptyline regimen was increased to 50 mg/day.  Today, the patient states that amitriptyline has helped his insomnia at night.  However, he still feels depressed during the day.  No SI/HI.  He does not want to try psychotherapy.  States that Zoloft did not help in the past.  P: Start Cymbalta 30 mg/day for 1 week, followed by 60 mg/day.  We discussed that this medication can also help with his pain.  We also discussed that it can take 4-6 weeks for this medication to start working.  Follow-up in 3 months.

## 2021-05-07 NOTE — Assessment & Plan Note (Signed)
Patient would like to get his tetanus shot and flu shot today.  These were administered at the end of his visit today.

## 2021-05-07 NOTE — Patient Instructions (Addendum)
Thank you, [name] for allowing [place] to provide your care today. Today we discussed your pain, your emphysema, and your depression.  1) Depression: We have started you on a new medication called Cymbalta.  Please take 1 tablet/day for the first week, then take 2 tablets a day.  Please give this medication about 4-6 weeks to take effect.  2) Pain: I have refilled your tramadol to start on November 27 since you are not due for refills today.  I have ordered the following labs for you:  Lab Orders  No laboratory test(s) ordered today      Referrals ordered today:   Referral Orders  No referral(s) requested today     I have ordered the following medication/changed the following medications:   Stop the following medications: Medications Discontinued During This Encounter  Medication Reason   traMADol (ULTRAM) 50 MG tablet Reorder     Start the following medications: Meds ordered this encounter  Medications   traMADol (ULTRAM) 50 MG tablet    Sig: Take 2 tablets (100 mg total) by mouth every 8 (eight) hours as needed.    Dispense:  180 tablet    Refill:  0    Do not fill before 05/20/2021   DULoxetine (CYMBALTA) 30 MG capsule    Sig: Take 1 capsule (30 mg total) by mouth daily.    Dispense:  90 capsule    Refill:  3     Follow up: 3 months    Should you have any questions or concerns please call the internal medicine clinic at [phone].

## 2021-05-07 NOTE — Assessment & Plan Note (Signed)
Patient is currently followed by pulmonology for his emphysema.  He is on albuterol and receives breathing treatments through pulmonology.  He thinks that his cough has somewhat worsened since he was last seen.  However, he denies fevers, chills.  He coughs up clear-colored sputum.  Patient states that he is due to be seen by pulmonology soon.  P: Continue current regimen per pulmonology.

## 2021-05-08 ENCOUNTER — Other Ambulatory Visit (HOSPITAL_COMMUNITY): Payer: Self-pay

## 2021-05-10 ENCOUNTER — Other Ambulatory Visit (HOSPITAL_COMMUNITY): Payer: Self-pay

## 2021-05-14 NOTE — Progress Notes (Addendum)
Internal Medicine Clinic Attending ? ?I saw and evaluated the patient.  I personally confirmed the key portions of the history and exam documented by Dr. Bonanno and I reviewed pertinent patient test results.  The assessment, diagnosis, and plan were formulated together and I agree with the documentation in the resident?s note. ? ?

## 2021-05-18 ENCOUNTER — Other Ambulatory Visit (HOSPITAL_COMMUNITY): Payer: Self-pay

## 2021-05-20 DIAGNOSIS — J9611 Chronic respiratory failure with hypoxia: Secondary | ICD-10-CM | POA: Diagnosis not present

## 2021-05-20 DIAGNOSIS — J432 Centrilobular emphysema: Secondary | ICD-10-CM | POA: Diagnosis not present

## 2021-05-21 ENCOUNTER — Other Ambulatory Visit (HOSPITAL_COMMUNITY): Payer: Self-pay

## 2021-05-21 ENCOUNTER — Telehealth: Payer: Self-pay

## 2021-05-21 DIAGNOSIS — K860 Alcohol-induced chronic pancreatitis: Secondary | ICD-10-CM

## 2021-05-21 MED ORDER — TRAMADOL HCL 50 MG PO TABS
100.0000 mg | ORAL_TABLET | Freq: Three times a day (TID) | ORAL | 0 refills | Status: DC | PRN
  Filled 2021-05-21: qty 180, 30d supply, fill #0

## 2021-05-21 NOTE — Addendum Note (Signed)
Addended by: Verdene Lennert on: 05/21/2021 11:35 AM   Modules accepted: Orders

## 2021-05-21 NOTE — Telephone Encounter (Signed)
Received a TC from [place] at [facility] who states Dr. Alroy Bailiff is not contracted with medicaid and she is requesting another physician send in the Tramadol. Will forward to yellow team. SChaplin, RN,BSN

## 2021-06-05 ENCOUNTER — Other Ambulatory Visit: Payer: Self-pay | Admitting: Pulmonary Disease

## 2021-06-05 ENCOUNTER — Other Ambulatory Visit (HOSPITAL_COMMUNITY): Payer: Self-pay

## 2021-06-05 MED ORDER — YUPELRI 175 MCG/3ML IN SOLN
175.0000 ug | Freq: Every day | RESPIRATORY_TRACT | 3 refills | Status: DC
  Filled 2021-06-05: qty 90, 30d supply, fill #0
  Filled 2021-07-03: qty 90, 30d supply, fill #1
  Filled 2021-07-31: qty 90, 30d supply, fill #2
  Filled 2021-08-21: qty 90, 30d supply, fill #3

## 2021-06-19 ENCOUNTER — Other Ambulatory Visit (HOSPITAL_COMMUNITY): Payer: Self-pay

## 2021-06-19 ENCOUNTER — Other Ambulatory Visit: Payer: Self-pay

## 2021-06-19 DIAGNOSIS — K860 Alcohol-induced chronic pancreatitis: Secondary | ICD-10-CM

## 2021-06-19 MED ORDER — TRAMADOL HCL 50 MG PO TABS
100.0000 mg | ORAL_TABLET | Freq: Three times a day (TID) | ORAL | 0 refills | Status: DC | PRN
  Filled 2021-06-19: qty 180, 30d supply, fill #0

## 2021-06-19 NOTE — Telephone Encounter (Signed)
traMADol (ULTRAM) 50 MG tablet, REFILL REQUEST @ [facility]. ?

## 2021-06-19 NOTE — Telephone Encounter (Signed)
Last refilled 05/21/21. Next OV 07/10/21.

## 2021-06-25 ENCOUNTER — Other Ambulatory Visit (HOSPITAL_COMMUNITY): Payer: Self-pay

## 2021-07-03 ENCOUNTER — Other Ambulatory Visit (HOSPITAL_COMMUNITY): Payer: Self-pay

## 2021-07-03 ENCOUNTER — Other Ambulatory Visit: Payer: Self-pay | Admitting: Internal Medicine

## 2021-07-03 DIAGNOSIS — F32A Depression, unspecified: Secondary | ICD-10-CM

## 2021-07-04 ENCOUNTER — Other Ambulatory Visit (HOSPITAL_COMMUNITY): Payer: Self-pay

## 2021-07-04 MED ORDER — AMITRIPTYLINE HCL 50 MG PO TABS
50.0000 mg | ORAL_TABLET | Freq: Every day | ORAL | 5 refills | Status: DC
  Filled 2021-07-04: qty 30, 30d supply, fill #0
  Filled 2021-07-31: qty 30, 30d supply, fill #1
  Filled 2021-08-21: qty 30, 30d supply, fill #2
  Filled 2021-09-16: qty 30, 30d supply, fill #3
  Filled 2021-10-14: qty 30, 30d supply, fill #4
  Filled 2021-11-10: qty 30, 30d supply, fill #5

## 2021-07-05 ENCOUNTER — Other Ambulatory Visit (HOSPITAL_COMMUNITY): Payer: Self-pay

## 2021-07-06 ENCOUNTER — Other Ambulatory Visit (HOSPITAL_COMMUNITY): Payer: Self-pay

## 2021-07-10 ENCOUNTER — Encounter: Payer: Medicaid Other | Admitting: Internal Medicine

## 2021-07-11 DIAGNOSIS — L400 Psoriasis vulgaris: Secondary | ICD-10-CM | POA: Diagnosis not present

## 2021-07-16 DIAGNOSIS — L400 Psoriasis vulgaris: Secondary | ICD-10-CM | POA: Diagnosis not present

## 2021-07-17 ENCOUNTER — Other Ambulatory Visit: Payer: Self-pay | Admitting: Internal Medicine

## 2021-07-17 ENCOUNTER — Other Ambulatory Visit (HOSPITAL_COMMUNITY): Payer: Self-pay

## 2021-07-17 DIAGNOSIS — K860 Alcohol-induced chronic pancreatitis: Secondary | ICD-10-CM

## 2021-07-17 MED ORDER — HYDROXYZINE HCL 25 MG PO TABS
25.0000 mg | ORAL_TABLET | Freq: Three times a day (TID) | ORAL | 2 refills | Status: DC | PRN
  Filled 2021-07-17: qty 100, 34d supply, fill #0
  Filled 2021-08-21: qty 100, 34d supply, fill #1
  Filled 2021-09-24: qty 100, 34d supply, fill #2

## 2021-07-17 MED ORDER — METHOTREXATE 2.5 MG PO TABS
2.5000 mg | ORAL_TABLET | Freq: Two times a day (BID) | ORAL | 0 refills | Status: AC
  Filled 2021-07-17: qty 6, 3d supply, fill #0

## 2021-07-17 NOTE — Telephone Encounter (Signed)
Last rx written  06/19/21. Last OV  05/07/21. Next OV 07/20/21. UDS  01/18/21.

## 2021-07-18 ENCOUNTER — Other Ambulatory Visit (HOSPITAL_COMMUNITY): Payer: Self-pay

## 2021-07-19 ENCOUNTER — Other Ambulatory Visit: Payer: Self-pay | Admitting: Pulmonary Disease

## 2021-07-19 ENCOUNTER — Other Ambulatory Visit (HOSPITAL_COMMUNITY): Payer: Self-pay

## 2021-07-19 MED ORDER — TRAMADOL HCL 50 MG PO TABS
100.0000 mg | ORAL_TABLET | Freq: Three times a day (TID) | ORAL | 0 refills | Status: DC | PRN
  Filled 2021-07-19: qty 180, 30d supply, fill #0

## 2021-07-19 MED ORDER — FORMOTEROL FUMARATE 20 MCG/2ML IN NEBU
2.0000 mL | INHALATION_SOLUTION | Freq: Two times a day (BID) | RESPIRATORY_TRACT | 5 refills | Status: DC
  Filled 2021-07-19: qty 120, 30d supply, fill #0
  Filled 2021-08-21: qty 120, 30d supply, fill #1
  Filled 2021-09-16: qty 120, 30d supply, fill #2
  Filled 2021-10-17: qty 120, 30d supply, fill #3
  Filled 2021-11-15: qty 120, 30d supply, fill #4
  Filled 2021-12-19: qty 120, 30d supply, fill #5

## 2021-07-19 NOTE — Addendum Note (Signed)
Addended by: Fredderick Severance on: 07/19/2021 02:31 PM   Modules accepted: Orders

## 2021-07-19 NOTE — Telephone Encounter (Signed)
Received call from Lurena Joiner at [facility] stating Dr. Alroy Bailiff is not yet certified with [employer]. Requesting Rx for tramadol be resent by another Provider.

## 2021-07-20 ENCOUNTER — Ambulatory Visit: Payer: Medicaid Other | Admitting: Internal Medicine

## 2021-07-20 DIAGNOSIS — F10929 Alcohol use, unspecified with intoxication, unspecified: Secondary | ICD-10-CM

## 2021-07-20 DIAGNOSIS — L409 Psoriasis, unspecified: Secondary | ICD-10-CM | POA: Diagnosis not present

## 2021-07-20 DIAGNOSIS — J430 Unilateral pulmonary emphysema [MacLeod's syndrome]: Secondary | ICD-10-CM

## 2021-07-20 DIAGNOSIS — K860 Alcohol-induced chronic pancreatitis: Secondary | ICD-10-CM

## 2021-07-20 DIAGNOSIS — F32A Depression, unspecified: Secondary | ICD-10-CM

## 2021-07-20 NOTE — Assessment & Plan Note (Signed)
Was started on duloxetine at last OV. He was instructed to start at 30mg  and increase to 60mg  after one week. He reports he has only been taking 30mg  as he did not know he was meant to take 60mg . He states medication has improved mood, but feels as though it could improve more. Feels like amitriptyline 50mg  has helped and improved sleep. No SI or HI. Mood improved on duloxetine and amitriptyline.  Will continue current dose amitriptyline. Advised patient to take two 30mg  duloxetine for total 60mg  daily.  Will plan to follow up in 3 months to reassess.

## 2021-07-20 NOTE — Patient Instructions (Signed)
Dear [name],  Thank you for trusting [place] with your care today.  Today we discussed your depression, pancreatitis, psoriasis, and COPD.  For your depression: - please take two of the 30mg  duloxetine pills per day for a total of 60mg . We can send in a refill as necessary. - please continue to take the amitriptyline 50mg  daily as well.  For your pancreatitis: - we will continue to fill the tramadol as needed.  For your psoriasis: - please continue to follow along with your dermatologist.  - we will request records from their office.  For your COPD: - please continue to take your current medication regimen. - please follow up with your lung doctor in February.   Please return to the clinic in 3 months for a follow up on your chronic conditions.

## 2021-07-20 NOTE — Progress Notes (Signed)
° °  CC: routine check up and med refill  HPI:[name] is a 61 y.o. male who presents for evaluation of check up and med refill. Please see individual problem based A/P for details.  Depression, PHQ-9: Based on the patients  Flowsheet Row Office Visit from 07/20/2021 in [facility]  PHQ-9 Total Score 7      score we have 7.  Past Medical History:  Diagnosis Date   Chronic pancreatitis (HCC)    Depression    Duodenitis    by EGD 1/07   Emphysema    unclear diagnosis noted in previous EMR   History of alcohol abuse    Hyperlipidemia    Psoriasis    extensive, responds to strong steroids, unable to start molecular therapies due to cost   RUE weakness    2/2 cervical spondylosis   Stomach ulcer    Review of Systems:   Review of Systems  Constitutional: Negative.   HENT: Negative.    Eyes: Negative.   Respiratory:  Positive for shortness of breath.   Cardiovascular: Negative.   Gastrointestinal: Negative.   Genitourinary: Negative.   Musculoskeletal: Negative.   Skin: Negative.   Neurological: Negative.   Endo/Heme/Allergies: Negative.   Psychiatric/Behavioral: Negative.      Physical Exam: Vitals:   07/20/21 1004 07/20/21 1009  BP: 112/69 112/69  Pulse: 99 97  SpO2: 100%   Weight: 126 lb 1.6 oz (57.2 kg)      General: alert and oriented HEENT: Conjunctiva nl , antiicteric sclerae, moist mucous membranes, no exudate or erythema Cardiovascular: Normal rate, regular rhythm.  No murmurs, rubs, or gallops Pulmonary : Decreased breath sounds bilaterally, no wheezes, rales, or rhonchi Abdominal: soft, nontender,  bowel sounds present Ext: No edema in lower extremities, no tenderness to palpation of lower extremities.   Assessment & Plan:   See Encounters Tab for problem based charting.  Patient seen with Dr.  Lafonda Mosses

## 2021-07-22 ENCOUNTER — Encounter: Payer: Self-pay | Admitting: Internal Medicine

## 2021-07-22 NOTE — Assessment & Plan Note (Signed)
Patient reports pain is stable. No changes. Controlled with tramadol, 5/10. Continue tramadol 100mg  TID

## 2021-07-22 NOTE — Assessment & Plan Note (Signed)
Managed by Dr. Halford Chessman with appt in Feb. Currently on 2L O2 at rest and 3L with exertion. Stable on yupelri, perforomist, pulmicort, and PRN albuterol.  Continue current management per pulm.

## 2021-07-22 NOTE — Assessment & Plan Note (Signed)
Patient taking MTX. Follows with derm through [place]. Has been getting blood drawn routinely through [facility] for derm, due to difficult stick. Reports having blood drawn approximately 1 week ago. Psoriasis has improved dramatically since initiating MTX. Record and labs requested from [facility] derm.

## 2021-07-23 ENCOUNTER — Other Ambulatory Visit (HOSPITAL_COMMUNITY): Payer: Self-pay

## 2021-07-23 MED ORDER — DULOXETINE HCL 30 MG PO CPEP
30.0000 mg | ORAL_CAPSULE | Freq: Two times a day (BID) | ORAL | 3 refills | Status: DC
Start: 2021-07-23 — End: 2021-11-27
  Filled 2021-07-23: qty 60, 30d supply, fill #0
  Filled 2021-08-21: qty 60, 30d supply, fill #1
  Filled 2021-09-16: qty 60, 30d supply, fill #2
  Filled 2021-10-14: qty 60, 30d supply, fill #3
  Filled 2021-11-10: qty 60, 30d supply, fill #4

## 2021-07-23 NOTE — Addendum Note (Signed)
Addended by: Adron Bene T on: 07/23/2021 08:44 AM   Modules accepted: Orders

## 2021-07-24 ENCOUNTER — Other Ambulatory Visit: Payer: Self-pay | Admitting: Internal Medicine

## 2021-07-24 ENCOUNTER — Other Ambulatory Visit (HOSPITAL_COMMUNITY): Payer: Self-pay

## 2021-07-24 NOTE — Progress Notes (Signed)
Internal Medicine Clinic Attending  I saw and evaluated the patient.  I personally confirmed the key portions of the history and exam documented by Dr. Gawaluck and I reviewed pertinent patient test results.  The assessment, diagnosis, and plan were formulated together and I agree with the documentation in the resident's note.  

## 2021-07-25 ENCOUNTER — Other Ambulatory Visit (HOSPITAL_COMMUNITY): Payer: Self-pay

## 2021-07-26 ENCOUNTER — Other Ambulatory Visit (HOSPITAL_COMMUNITY): Payer: Self-pay

## 2021-07-26 MED ORDER — METHOTREXATE 2.5 MG PO TABS
2.5000 mg | ORAL_TABLET | Freq: Two times a day (BID) | ORAL | 2 refills | Status: DC
  Filled 2021-07-26: qty 12, 28d supply, fill #0
  Filled 2021-08-21: qty 12, 28d supply, fill #1
  Filled 2021-09-24: qty 12, 28d supply, fill #2

## 2021-07-31 ENCOUNTER — Other Ambulatory Visit: Payer: Self-pay | Admitting: Pulmonary Disease

## 2021-07-31 ENCOUNTER — Other Ambulatory Visit (HOSPITAL_COMMUNITY): Payer: Self-pay

## 2021-07-31 MED ORDER — PROAIR HFA 108 (90 BASE) MCG/ACT IN AERS
2.0000 | INHALATION_SPRAY | Freq: Four times a day (QID) | RESPIRATORY_TRACT | 5 refills | Status: DC | PRN
  Filled 2021-07-31: qty 8.5, 25d supply, fill #0

## 2021-08-01 ENCOUNTER — Other Ambulatory Visit: Payer: Self-pay | Admitting: Pulmonary Disease

## 2021-08-01 ENCOUNTER — Other Ambulatory Visit (HOSPITAL_COMMUNITY): Payer: Self-pay

## 2021-08-01 MED ORDER — ALBUTEROL SULFATE HFA 108 (90 BASE) MCG/ACT IN AERS
2.0000 | INHALATION_SPRAY | Freq: Four times a day (QID) | RESPIRATORY_TRACT | 5 refills | Status: DC | PRN
Start: 2021-08-01 — End: 2022-01-09
  Filled 2021-08-01: qty 18, 25d supply, fill #0
  Filled 2021-11-04: qty 18, 25d supply, fill #1

## 2021-08-15 ENCOUNTER — Other Ambulatory Visit: Payer: Self-pay

## 2021-08-15 DIAGNOSIS — K860 Alcohol-induced chronic pancreatitis: Secondary | ICD-10-CM

## 2021-08-15 NOTE — Telephone Encounter (Signed)
traMADol (ULTRAM) 50 MG tablet, refill request @ [facility]. 

## 2021-08-15 NOTE — Telephone Encounter (Signed)
Last rx written 07/19/21. Last OV 07/20/21. Next OV  No f/u appt.

## 2021-08-16 ENCOUNTER — Other Ambulatory Visit (HOSPITAL_COMMUNITY): Payer: Self-pay

## 2021-08-16 ENCOUNTER — Other Ambulatory Visit: Payer: Self-pay | Admitting: Internal Medicine

## 2021-08-16 ENCOUNTER — Ambulatory Visit: Payer: Medicaid Other | Admitting: Pulmonary Disease

## 2021-08-16 DIAGNOSIS — K860 Alcohol-induced chronic pancreatitis: Secondary | ICD-10-CM

## 2021-08-17 ENCOUNTER — Other Ambulatory Visit (HOSPITAL_COMMUNITY): Payer: Self-pay

## 2021-08-17 MED ORDER — TRAMADOL HCL 50 MG PO TABS: 100.0000 mg | ORAL_TABLET | Freq: Three times a day (TID) | ORAL | 0 refills | Status: DC | PRN

## 2021-08-20 ENCOUNTER — Other Ambulatory Visit: Payer: Self-pay | Admitting: Internal Medicine

## 2021-08-20 ENCOUNTER — Other Ambulatory Visit (HOSPITAL_COMMUNITY): Payer: Self-pay

## 2021-08-20 DIAGNOSIS — K860 Alcohol-induced chronic pancreatitis: Secondary | ICD-10-CM

## 2021-08-20 DIAGNOSIS — J432 Centrilobular emphysema: Secondary | ICD-10-CM | POA: Diagnosis not present

## 2021-08-20 DIAGNOSIS — J9611 Chronic respiratory failure with hypoxia: Secondary | ICD-10-CM | POA: Diagnosis not present

## 2021-08-20 MED ORDER — TRAMADOL HCL 50 MG PO TABS
100.0000 mg | ORAL_TABLET | Freq: Three times a day (TID) | ORAL | 0 refills | Status: DC | PRN
  Filled 2021-08-20: qty 180, 30d supply, fill #0

## 2021-08-20 NOTE — Telephone Encounter (Signed)
Refill Request  Pt requesting a call back.  Pt states he is now completely ou of his medication and wants to know why his medication has not been filled yet.   traMADol (ULTRAM) 50 MG tablet  [facility] (Ph: [phone])

## 2021-08-20 NOTE — Addendum Note (Signed)
Addended by: Fredderick Severance on: 08/20/2021 09:16 AM   Modules accepted: Orders

## 2021-08-21 ENCOUNTER — Other Ambulatory Visit (HOSPITAL_COMMUNITY): Payer: Self-pay

## 2021-08-22 ENCOUNTER — Other Ambulatory Visit (HOSPITAL_COMMUNITY): Payer: Self-pay

## 2021-08-23 ENCOUNTER — Other Ambulatory Visit (HOSPITAL_COMMUNITY): Payer: Self-pay

## 2021-08-31 ENCOUNTER — Ambulatory Visit: Payer: Medicaid Other | Admitting: Pulmonary Disease

## 2021-08-31 ENCOUNTER — Encounter: Payer: Self-pay | Admitting: Pulmonary Disease

## 2021-08-31 ENCOUNTER — Other Ambulatory Visit: Payer: Self-pay

## 2021-08-31 ENCOUNTER — Ambulatory Visit (INDEPENDENT_AMBULATORY_CARE_PROVIDER_SITE_OTHER): Payer: Medicaid Other

## 2021-08-31 ENCOUNTER — Other Ambulatory Visit (HOSPITAL_COMMUNITY): Payer: Self-pay

## 2021-08-31 VITALS — BP 102/64 | HR 103 | Temp 97.8°F | Ht 68.0 in | Wt 127.6 lb

## 2021-08-31 DIAGNOSIS — J432 Centrilobular emphysema: Secondary | ICD-10-CM | POA: Diagnosis not present

## 2021-08-31 DIAGNOSIS — J9611 Chronic respiratory failure with hypoxia: Secondary | ICD-10-CM

## 2021-08-31 DIAGNOSIS — J439 Emphysema, unspecified: Secondary | ICD-10-CM | POA: Diagnosis not present

## 2021-08-31 DIAGNOSIS — J441 Chronic obstructive pulmonary disease with (acute) exacerbation: Secondary | ICD-10-CM

## 2021-08-31 MED ORDER — CEFUROXIME AXETIL 500 MG PO TABS
500.0000 mg | ORAL_TABLET | Freq: Two times a day (BID) | ORAL | 0 refills | Status: DC
  Filled 2021-08-31: qty 14, 7d supply, fill #0

## 2021-08-31 MED ORDER — PREDNISONE 10 MG PO TABS
ORAL_TABLET | ORAL | 0 refills | Status: AC
  Filled 2021-08-31: qty 20, 8d supply, fill #0

## 2021-08-31 NOTE — Progress Notes (Signed)
? ?[facility] ? ?Chief Complaint  ?Patient presents with  ? Follow-up  ?  Follow up. Patient says his breathing is getting bad.   ? ? ?Constitutional:  ?BP 102/64 (BP Location: Right Arm, Patient Position: Sitting, Cuff Size: Normal)   Pulse (!) 103   Temp 97.8 ?F (36.6 ?C) (Oral)   Ht 5\' 8"  (1.727 m)   Wt 127 lb 9.6 oz (57.9 kg)   SpO2 95%   BMI 19.40 kg/m?  ? ?Past Medical History:  ?Chronic pancreatitis, Depression, ETOH, HLD, Psoriasis, PUD, Rt PTX ? ?Past Surgical History:  ?He  has a past surgical history that includes Laparoscopic cholecystectomy single site with intraoperative cholangiogram (N/A, 11/28/2016); Liver biopsy (N/A, 11/28/2016); Laparoscopic cholecystectomy (11/28/2016); Tympanostomy tube placement (Bilateral, ~ 1970); and Esophagogastroduodenoscopy (egd) with propofol (N/A, 12/04/2016). ? ?Brief Summary:  ?[name] is a 61 y.o. male former smoker with COPD and bullous emphysema. ?  ? ? ? ?Subjective:  ? ?He has increased cough with brown sputum.  Has more wheeze. Not having fever, hemoptysis, or leg swelling.  Feels more sore in his right chest.  Using 3 liters oxygen.  Using albuterol 3 to 4 times per day.  Weight steady. ? ?Physical Exam:  ? ?Appearance - well kempt, thin, wearing oxygen ? ?ENMT - no sinus tenderness, no oral exudate, no LAN, Mallampati 3 airway, no stridor ? ?Respiratory - decreased breath sounds, scattered rhonchi ? ?CV - s1s2 regular rate and rhythm, no murmurs ? ?Ext - no clubbing, no edema ? ?Skin - no rashes ? ?Psych - normal mood and affect ?  ?Pulmonary testing:  ?PFT 07/14/17 >> FEV1 1.69 (48%), FEV1% 41, TLC 7.09 (107%), RV 3.41 (168%), DLCO 32% ?A1AT 05/27/18 >> 192, MM ?ONO with RA 06/09/18 >> test time 8 hrs 59 min.  Baseline SpO2 95%, low SpO2 90% ? ?Chest Imaging:  ?CT angio chest 05/22/18 >> severe emphysema, moderate bullous area Rt upper lung, 2 mm nodule RLL, 7 mm nodule in lingula, 21 mm nodule LLL ?CT chest  09/09/18 >> bullous emphysema, mod/severe centrilobular emphysema, diffuse bronchial wall thickening, resolved nodule LLL, new 6 mm solid nodule LUL, new bandlike consolidation RUP ?CT chest 03/10/19 >> LUL nodule resolved, stable 3 mm nodule RUL ? ?Cardiac Tests:  ?Echo 10/11/16 >> EF 69%, grade 1 DD ? ?Social History:  ?He  reports that he quit smoking about 3 years ago. His smoking use included cigarettes. He has a 4.00 pack-year smoking history. He has never used smokeless tobacco. He reports that he does not drink alcohol and does not use drugs. ? ?Family History:  ?His family history includes Cancer in his father; Diabetes in his mother; Heart disease in his father. ?  ? ? ?Assessment/Plan:  ? ?COPD exacerbation. ?- ill give him course of ceftin and prednisone ?- chest xray today ? 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 ?- wasn't able to meet criteria for lung transplant due to financial constraints and requirement to live closer to transplant center ?- seen by thoracic surgery and not candidate for lung volume reduction ?- continue yupelri, perforomist, and pulmicort ?- prn albuterol ?- reviewed different roles for his respiratory medictions ?- might be at point that he needs chronic low dose prednisone ?- might need to consider referral to palliative care at some point soon ?  ?Chronic respiratory failure with hypoxia. ?- goal SpO2 > 90% ?- gets supplies from [employer] ?- uses 3  liters oxygen with exertion and sleep ?  ?Psoriasis. ?- he is on MTX with [facility]ology [facility] ? ?Time Spent Involved in Patient Care on Day of Examination:  ?36 minutes ? ?Follow up:  ? ?Patient Instructions  ?Chest xray today ? ?Ceftin 500 mg antibiotic tablet twice per day for 7 days ? ?Prednisone 10 mg pill >> 4 pills daily for 2 days, 3 pills daily for 2 days, 2 pills daily for 2 days, 1 pill daily for 2 days ? ?Follow up in 4 weeks with Dr. Halford Chessman or Nurse Practitioner ? ?Medication List:   ? ?Allergies as of 08/31/2021   ? ?   Reactions  ? Nsaids Other (See Comments)  ? Bleeding gastritis  ? ?  ? ?  ?Medication List  ?  ? ?  ? Accurate as of August 31, 2021 11:39 AM. If you have any questions, ask your nurse or doctor.  ?  ?  ? ?  ? ?albuterol (2.5 MG/3ML) 0.083% nebulizer solution ?Commonly known as: PROVENTIL ?TAKE 3 MLS (2.5 MG TOTAL) BY NEBULIZATION EVERY 6 (SIX) HOURS AS NEEDED FOR WHEEZING OR SHORTNESS OF BREATH. ?  ?Ventolin HFA 108 (90 Base) MCG/ACT inhaler ?Generic drug: albuterol ?Inhale 2 puffs into the lungs every 6 (six) hours as needed for wheezing or shortness of breath. ?  ?amitriptyline 50 MG tablet ?Commonly known as: ELAVIL ?Take 1 tablet (50 mg total) by mouth at bedtime. ?  ?cefUROXime 500 MG tablet ?Commonly known as: CEFTIN ?Take 1 tablet (500 mg total) by mouth 2 (two) times daily with a meal. ?Started by: Chesley Mires, MD ?  ?DULoxetine 30 MG capsule ?Commonly known as: Cymbalta ?Take 1 capsule (30 mg total) by mouth 2 (two) times daily. ?  ?formoterol 20 MCG/2ML nebulizer solution ?Commonly known as: PERFOROMIST ?Take 2 mLs (20 mcg total) by nebulization 2 (two) times daily. ?  ?hydrOXYzine 25 MG tablet ?Commonly known as: ATARAX ?Take 1 tablet (25 mg total) by mouth 3 (three) times daily as needed. ?  ?methotrexate 2.5 MG tablet ?Take 1 tablet (2.5 mg total) by mouth every 12 hours for 3 doses (3 tablets per week) ?  ?methotrexate 2.5 MG tablet ?Take 1 tablet (2.5 mg total) by mouth every 12 (twelve) hours for 3 doses (3 tabs per week) ?  ?predniSONE 10 MG tablet ?Commonly known as: DELTASONE ?Take 4 tablets (40 mg total) by mouth daily with breakfast for 2 days, THEN 3 tablets (30 mg total) daily with breakfast for 2 days, THEN 2 tablets (20 mg total) daily with breakfast for 2 days, THEN 1 tablet (10 mg total) daily with breakfast for 2 days. ?Start taking on: August 31, 2021 ?Started by: Chesley Mires, MD ?  ?Pulmicort 0.5 MG/2ML nebulizer solution ?Generic drug:  budesonide ?INHALE 1 VIAL BY NEBULIZATION 2 TIMES DAILY. ?  ?traMADol 50 MG tablet ?Commonly known as: ULTRAM ?Take 2 tablets (100 mg total) by mouth every 8 (eight) hours as needed. ?  ?Yupelri 175 MCG/3ML nebulizer solution ?Generic drug: revefenacin ?Inhale 3 mLs (175 mcg total) by nebulization daily. ?  ? ?  ? ? ?Signature:  ?Chesley Mires, MD ?[place] ?Pager - [phone] ?08/31/2021, 11:39 AM ?  ? ? ? ? ? ? ? ? ?

## 2021-08-31 NOTE — Patient Instructions (Signed)
Chest xray today ? ?Ceftin 500 mg antibiotic tablet twice per day for 7 days ? ?Prednisone 10 mg pill >> 4 pills daily for 2 days, 3 pills daily for 2 days, 2 pills daily for 2 days, 1 pill daily for 2 days ? ?Follow up in 4 weeks with Dr. Halford Chessman or Nurse Practitioner ?

## 2021-09-13 ENCOUNTER — Other Ambulatory Visit: Payer: Self-pay

## 2021-09-13 DIAGNOSIS — K860 Alcohol-induced chronic pancreatitis: Secondary | ICD-10-CM

## 2021-09-13 NOTE — Telephone Encounter (Signed)
traMADol (ULTRAM) 50 MG tablet, REFILL REQUEST @ [facility]. ?

## 2021-09-17 ENCOUNTER — Other Ambulatory Visit: Payer: Self-pay

## 2021-09-17 ENCOUNTER — Encounter: Payer: Self-pay | Admitting: Internal Medicine

## 2021-09-17 ENCOUNTER — Ambulatory Visit (INDEPENDENT_AMBULATORY_CARE_PROVIDER_SITE_OTHER): Payer: Medicaid Other | Admitting: Internal Medicine

## 2021-09-17 ENCOUNTER — Other Ambulatory Visit: Payer: Self-pay | Admitting: Student

## 2021-09-17 ENCOUNTER — Other Ambulatory Visit (HOSPITAL_COMMUNITY): Payer: Self-pay

## 2021-09-17 VITALS — BP 116/76 | HR 101 | Temp 98.1°F | Ht 68.0 in | Wt 124.8 lb

## 2021-09-17 DIAGNOSIS — F10929 Alcohol use, unspecified with intoxication, unspecified: Secondary | ICD-10-CM

## 2021-09-17 DIAGNOSIS — K860 Alcohol-induced chronic pancreatitis: Secondary | ICD-10-CM | POA: Diagnosis not present

## 2021-09-17 DIAGNOSIS — J9611 Chronic respiratory failure with hypoxia: Secondary | ICD-10-CM | POA: Diagnosis not present

## 2021-09-17 DIAGNOSIS — J441 Chronic obstructive pulmonary disease with (acute) exacerbation: Secondary | ICD-10-CM

## 2021-09-17 DIAGNOSIS — F32A Depression, unspecified: Secondary | ICD-10-CM | POA: Diagnosis not present

## 2021-09-17 DIAGNOSIS — Z1211 Encounter for screening for malignant neoplasm of colon: Secondary | ICD-10-CM | POA: Diagnosis not present

## 2021-09-17 DIAGNOSIS — J432 Centrilobular emphysema: Secondary | ICD-10-CM | POA: Diagnosis not present

## 2021-09-17 DIAGNOSIS — Z0000 Encounter for general adult medical examination without abnormal findings: Secondary | ICD-10-CM

## 2021-09-17 MED ORDER — PREDNISONE 10 MG PO TABS
10.0000 mg | ORAL_TABLET | Freq: Every day | ORAL | 0 refills | Status: DC
  Filled 2021-09-17: qty 20, 20d supply, fill #0

## 2021-09-17 MED ORDER — PREDNISONE 10 MG PO TABS
10.0000 mg | ORAL_TABLET | Freq: Every day | ORAL | 0 refills | Status: DC
  Filled 2021-09-17: qty 10, 10d supply, fill #0

## 2021-09-17 NOTE — Patient Instructions (Signed)
Thank you, [name] for allowing [place] to provide your care today. Today we discussed your COPD, pain meds, and need for colonoscopy. ? ?COPD: Your symptoms seem most consistent with a COPD exacerbation. You will take the following medication: ?Prednisone 10 mg pill >> 4 pills daily for 2 days, 3 pills daily for 2 days, 2 pills daily for 2 days, 1 pill daily for 2 days. ? ? ?I have placed a referral for you to get a colonoscopy to screen for colon cancer.  They will contact you to arrange this. ? ?As soon as you are due for your tramadol on Wednesday, I will refill it for you. ? ? ?I have ordered the following labs for you: ? ?Lab Orders    ?     ToxAssure Select,+Antidepr,UR     ? ? ?Referrals ordered today:  ? ?Referral Orders    ?     Ambulatory referral to Gastroenterology    ?  ? ?I have ordered the following medication/changed the following medications:  ? ?Stop the following medications: ?There are no discontinued medications.  ? ?Start the following medications: ?Meds ordered this encounter  ?Medications  ? predniSONE (DELTASONE) 10 MG tablet  ?  Sig: Take 1 tablet (10 mg total) by mouth daily with breakfast.  ?  Dispense:  10 tablet  ?  Refill:  0  ?  ? ?Follow up: 3 months  ? ? ?Should you have any questions or concerns please call the internal medicine clinic at [phone].   ? ? ? ?

## 2021-09-17 NOTE — Assessment & Plan Note (Signed)
The patient has been on duloxetine 60 mg daily.  Patient states that this helps manage his depressive symptoms.  Continue current regimen. ?

## 2021-09-17 NOTE — Assessment & Plan Note (Signed)
The patient is here today for routine follow-up.  He is followed by pulmonology for his COPD with bullous emphysema.  On March 10, he was treated for COPD exacerbation with antibiotics and prednisone taper.  At that time, it was discussed that he would return in 4 weeks for consideration of starting chronic steroids. ? ?Today, the patient states that his shortness of breath and cough productive of sputum have been worse since he finished his antibiotics and steroid taper.  He states that the medications helped his symptoms.  He confirms that he will see pulmonology in the next couple weeks for follow-up. ? ?On exam, patient had decreased breath sounds at bilateral lung bases, no wheezing or other adventitious lung sounds appreciated. ? ?Plan: ?We will prescribe a prednisone taper at this time for COPD exacerbation: ?Prednisone 10 mg pill >> 4 pills daily for 2 days, 3 pills daily for 2 days, 2 pills daily for 2 days, 1 pill daily for 2 days. ?Patient will follow-up with his pulmonologist in 2 weeks.  I will also reach out to his pulmonologist about his symptoms while on a low-dose methotrexate regimen which has been shown to cause interstitial pneumonitis. ?

## 2021-09-17 NOTE — Assessment & Plan Note (Signed)
Patient is on long-term tramadol for his chronic pancreatitis.  He is also due for a new pain contract today. ? ?Plan: ?PDMP reviewed.  Patient is due for refill this Wednesday, and discussed that I will refill this on Wednesday.  Tox assure obtained today. ?

## 2021-09-17 NOTE — Assessment & Plan Note (Signed)
Referral for screening colonoscopy was placed today. ?

## 2021-09-17 NOTE — Progress Notes (Addendum)
? ?  CC: follow-up for a new pain contract ? ?HPI: ? ?[name] is a 61 y.o. with past medical history as noted below who presents to the clinic today for follow-up for a new pain contract. Please see problem-based list for further details, assessments, and plans. ? ?Past Medical History:  ?Diagnosis Date  ? Chronic pancreatitis (HCC)   ? Depression   ? Duodenitis   ? by EGD 1/07  ? Emphysema   ? unclear diagnosis noted in previous EMR  ? History of alcohol abuse   ? Hyperlipidemia   ? Psoriasis   ? extensive, responds to strong steroids, unable to start molecular therapies due to cost  ? RUE weakness   ? 2/2 cervical spondylosis  ? Stomach ulcer   ? ?Review of Systems: Negative aside from that listed in individualized problem based charting.  ? ?Physical Exam: ? ?Vitals:  ? 09/17/21 1035 09/17/21 1102  ?BP: (!) 155/134 116/76  ?Pulse: 71 (!) 101  ?Temp: 98.1 ?F (36.7 ?C)   ?TempSrc: Oral   ?SpO2: 96%   ?Weight: 124 lb 12.8 oz (56.6 kg)   ?Height: 5\' 8"  (1.727 m)   ? ?General: alert and oriented ?HEENT: Conjunctiva nl, antiicteric sclerae, moist mucous membranes, no exudate or erythema ?Cardiovascular: Normal rate, regular rhythm.  No murmurs, rubs, or gallops ?Pulmonary: Decreased breath sounds bilaterally, no wheezes, rales, or rhonchi ?Abdominal: soft, nondistended bowel sounds present ?Ext: No edema in lower extremities ?MSK: no injury or deformity  ?Psych: Normal affect ? ? ?Assessment & Plan:  ? ?See Encounters Tab for problem based charting. ? ?Patient discussed with Dr. ? ?

## 2021-09-18 ENCOUNTER — Other Ambulatory Visit (HOSPITAL_COMMUNITY): Payer: Self-pay

## 2021-09-19 ENCOUNTER — Other Ambulatory Visit (HOSPITAL_COMMUNITY): Payer: Self-pay

## 2021-09-19 MED ORDER — TRAMADOL HCL 50 MG PO TABS
100.0000 mg | ORAL_TABLET | Freq: Three times a day (TID) | ORAL | 0 refills | Status: DC | PRN
  Filled 2021-09-19: qty 180, 30d supply, fill #0

## 2021-09-20 NOTE — Progress Notes (Signed)
Internal Medicine Clinic Attending  Case discussed with Dr. Bonanno  at the time of the visit.  We reviewed the resident's history and exam and pertinent patient test results.  I agree with the assessment, diagnosis, and plan of care documented in the resident's note.  

## 2021-09-22 LAB — TOXASSURE SELECT,+ANTIDEPR,UR

## 2021-09-24 ENCOUNTER — Other Ambulatory Visit (HOSPITAL_COMMUNITY): Payer: Self-pay

## 2021-09-28 ENCOUNTER — Other Ambulatory Visit (HOSPITAL_COMMUNITY): Payer: Self-pay

## 2021-09-28 ENCOUNTER — Encounter: Payer: Self-pay | Admitting: Nurse Practitioner

## 2021-09-28 ENCOUNTER — Ambulatory Visit: Payer: Medicaid Other | Admitting: Nurse Practitioner

## 2021-09-28 ENCOUNTER — Ambulatory Visit (INDEPENDENT_AMBULATORY_CARE_PROVIDER_SITE_OTHER): Payer: Medicaid Other

## 2021-09-28 VITALS — BP 110/80 | HR 109 | Ht 68.0 in | Wt 123.6 lb

## 2021-09-28 DIAGNOSIS — J439 Emphysema, unspecified: Secondary | ICD-10-CM | POA: Diagnosis not present

## 2021-09-28 DIAGNOSIS — R0600 Dyspnea, unspecified: Secondary | ICD-10-CM | POA: Diagnosis not present

## 2021-09-28 DIAGNOSIS — Z720 Tobacco use: Secondary | ICD-10-CM | POA: Diagnosis not present

## 2021-09-28 DIAGNOSIS — J441 Chronic obstructive pulmonary disease with (acute) exacerbation: Secondary | ICD-10-CM

## 2021-09-28 DIAGNOSIS — L409 Psoriasis, unspecified: Secondary | ICD-10-CM

## 2021-09-28 DIAGNOSIS — R059 Cough, unspecified: Secondary | ICD-10-CM | POA: Diagnosis not present

## 2021-09-28 DIAGNOSIS — J9611 Chronic respiratory failure with hypoxia: Secondary | ICD-10-CM

## 2021-09-28 DIAGNOSIS — R911 Solitary pulmonary nodule: Secondary | ICD-10-CM

## 2021-09-28 MED ORDER — PREDNISONE 10 MG PO TABS
ORAL_TABLET | ORAL | 0 refills | Status: DC
  Filled 2021-09-28: qty 30, fill #0
  Filled 2021-09-28: qty 30, 12d supply, fill #0

## 2021-09-28 MED ORDER — AMOXICILLIN-POT CLAVULANATE 875-125 MG PO TABS
1.0000 | ORAL_TABLET | Freq: Two times a day (BID) | ORAL | 0 refills | Status: DC
  Filled 2021-09-28: qty 14, 7d supply, fill #0

## 2021-09-28 NOTE — Assessment & Plan Note (Signed)
Stable on 3 lpm supplemental O2 with no increased O2 demand. Goal >88-90%.  ?

## 2021-09-28 NOTE — Assessment & Plan Note (Signed)
Previous stable RUL nodule, considered benign. LLL nodule resolved on 2020 imaging. Significant smoking history, quit in 2019. Referral to lung cancer screening program placed today.  ?

## 2021-09-28 NOTE — Assessment & Plan Note (Addendum)
Recurrent AECOPD. Initially improved with prednisone and ceftin; SOB worsened after completing taper and abx course. Repeat CXR today without acute process. Will treat with augmentin course x 7 days and extended prednisone taper. May need to consider daily prednisone if symptoms flare again as taper decreases or he completes it, which he is agreeable to. Continue triple therapy nebs. Initiate therapies for mucociliary clearance with mucinex Twice daily and flutter valve. Provided with more information on palliative care per his request, as this may be something to consider in the future if his respiratory status continues to decline.  ? ?Patient Instructions  ?-Continue Albuterol inhaler 2 puffs or 3 mL neb every 6 hours as needed for shortness of breath or wheezing. Notify if symptoms persist despite rescue inhaler/neb use. ?-Continue Perforomist 2 mLs neb Twice daily ?-Continue pulmicort 1 vial neb Twice daily. Brush tongue and rinse mouth well afterwards ?-Continue Yupelri 3 mLs neb daily  ?-Continue supplemental oxygen 3 lpm for goal oxygen saturation >88-90% ? ?-Prednisone taper. 4 tabs for 3 days, then 3 tabs for 3 days, 2 tabs for 3 days, then 1 tab for 3 days, then stop. Take in AM with food.  ?-Augmentin 875 mg Twice daily for 7 days. Notify immediately of any rash, itching, hives, or swelling, or seek emergency care.Finish your antibiotics in their entirety. Do not stop just because symptoms improve. Take with food to reduce GI upset. Take daily probiotic while taking  ?-Mucinex 600 mg Twice daily for chest congestion/cough ?-Flutter valve 2-3 times a day after nebulizer treatments  ? ?Chest x ray today. We will notify you of any abnormal results.  ? ?I have attached information about Palliative care per your request should we decide in the near future that this is appropriate.  ? ?If your breathing worsens or you having increased oxygen requirements over the weekend, seek emergency care.  ? ?Follow up in  one week with Dr. Craige Cotta or Philis Nettle. If symptoms do not improve or worsen, please contact office for sooner follow up or seek emergency care. ? ? ?

## 2021-09-28 NOTE — Progress Notes (Signed)
? ?@Patient  ID: Samuel SaaKenneth T Crossen, male    DOB: 03/26/1961, 61 y.o.   MRN: [id number] ? ?Chief Complaint  ?Patient presents with  ? Follow-up  ?  Says not feeling well lungs feels like full of water  ? ? ?Referring provider: ?Andrey CampanileBonanno, Marianne E, MD ? 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  He is a patient of Dr. Evlyn CourierSood's and last seen in office on 08/31/2021.  Past medical history significant for chronic pancreatitis, chronic cholecystitis, psoriasis on methotrexate, HLD, depression. ? ?TEST/EVENTS:  ?04/06/2019 PFTs: FVC 85%, FEV1 46%, ratio 57, TLC 107%, DLCOunc 33%.  Severe obstructive airway disease with increased lung volumes and severe diffusion defect.  No BD ?08/31/2021 CXR 2 view: Hyperinflation with emphysema and bronchitic changes.  There was scarring at the lingula, consistent with previous infection.  No acute airspace disease present. ? ?08/31/2021: OV with Dr. Craige CottaSood.  Treated for AECOPD with Ceftin and prednisone.  Chest x-ray was without acute process.  Previously unable to meet criteria for lung transplant due to financial constraints and requirement to live closer to transplant center.  Not a candidate for lung volume reduction per thoracic surgery.  Continued on triple therapy nebs and as needed albuterol.  Advised that he may need chronic low-dose prednisone at some point.  May also need to consider referral to palliative care at some point soon.  Continued on supplemental 3 L/min O2 for SPO2 goal 88 to 90%.   ? ?09/28/2021: Today-follow-up ?Patient presents today for scheduled follow-up; however over the past 2 weeks he has had worsening shortness of breath and increased productive cough.  He reports his cough is frequent with yellow to white sputum production.  Feels like he just cannot catch his breath whenever he does simple activities such as showering or chores around the house.   Initially started to feel better on Ceftin and prednisone but once coming off, symptoms started to flareup again.  He does notice an occasional wheeze.  Denies any hemoptysis, anorexia or recent weight loss. No recent PND, orthopnea, chest pain or lower extremity swelling. He maintains on 3 L/min supplemental O2 without any increasing oxygen demand.  He is using his triple therapy nebs as scheduled and his albuterol a few times a day.  He was curious what stage COPD he has, which we discussed the severity of his symptoms and his lung function is consistent with very severe COPD.  We did discuss the option of involving palliative care for goals of care in the future if he is interested. ? ?Allergies  ?Allergen Reactions  ? Nsaids Other (See Comments)  ?  Bleeding gastritis  ? ? ?Immunization History  ?Administered Date(s) Administered  ? Influenza Whole 02/20/2010  ? Influenza,inj,Quad PF,6+ Mos 04/01/2017, 06/09/2018, 03/18/2019, 03/27/2020  ? Influenza-Unspecified 03/28/2017, 05/07/2021  ? PFIZER(Purple Top)SARS-COV-2 Vaccination 09/17/2019, 10/21/2019, 05/31/2020  ? Tdap 07/27/2009, 05/07/2021  ? ? ?Past Medical History:  ?Diagnosis Date  ? Chronic pancreatitis (HCC)   ? Depression   ? Duodenitis   ? by EGD 1/07  ? Emphysema   ? unclear diagnosis noted in previous EMR  ? History of alcohol abuse   ? Hyperlipidemia   ? Psoriasis   ? extensive, responds to strong steroids, unable to start molecular therapies due to cost  ? RUE weakness   ? 2/2 cervical spondylosis  ? Stomach ulcer   ? ? ?Tobacco History: ?Social History  ? ?  Tobacco Use  ?Smoking Status Former  ? Packs/day: 0.10  ? Years: 40.00  ? Pack years: 4.00  ? Types: Cigarettes  ? Quit date: 05/19/2018  ? Years since quitting: 3.3  ?Smokeless Tobacco Never  ?Tobacco Comments  ? 4-5 cigs per day   ? ?Counseling given: Not Answered ?Tobacco comments: 4-5 cigs per day  ? ? ?Outpatient Medications Prior to Visit  ?Medication Sig Dispense Refill  ? albuterol  (VENTOLIN HFA) 108 (90 Base) MCG/ACT inhaler Inhale 2 puffs into the lungs every 6 (six) hours as needed for wheezing or shortness of breath. 18 g 5  ? amitriptyline (ELAVIL) 50 MG tablet Take 1 tablet (50 mg total) by mouth at bedtime. 30 tablet 5  ? DULoxetine (CYMBALTA) 30 MG capsule Take 1 capsule (30 mg total) by mouth 2 (two) times daily. 90 capsule 3  ? formoterol (PERFOROMIST) 20 MCG/2ML nebulizer solution Take 2 mLs (20 mcg total) by nebulization 2 (two) times daily. 120 mL 5  ? hydrOXYzine (ATARAX) 25 MG tablet Take 1 tablet (25 mg total) by mouth 3 (three) times daily as needed. 100 tablet 2  ? methotrexate (RHEUMATREX) 2.5 MG tablet Take 1 tablet (2.5 mg total) by mouth every 12 (twelve) hours for 3 doses (3 tabs per week) 12 tablet 2  ? methotrexate 2.5 MG tablet Take 1 tablet (2.5 mg total) by mouth every 12 hours for 3 doses (3 tablets per week) 12 tablet 2  ? PULMICORT 0.5 MG/2ML nebulizer solution INHALE 1 VIAL BY NEBULIZATION 2 TIMES DAILY. 120 mL 11  ? revefenacin (YUPELRI) 175 MCG/3ML nebulizer solution Inhale 3 mLs (175 mcg total) by nebulization daily. 90 mL 3  ? traMADol (ULTRAM) 50 MG tablet Take 2 tablets (100 mg total) by mouth every 8 (eight) hours as needed. 180 tablet 0  ? cefUROXime (CEFTIN) 500 MG tablet Take 1 tablet (500 mg total) by mouth 2 (two) times daily with a meal. 14 tablet 0  ? predniSONE (DELTASONE) 10 MG tablet Take 1 tablet (10 mg total) by mouth daily with breakfast. 20 tablet 0  ? albuterol (PROVENTIL) (2.5 MG/3ML) 0.083% nebulizer solution TAKE 3 MLS (2.5 MG TOTAL) BY NEBULIZATION EVERY 6 (SIX) HOURS AS NEEDED FOR WHEEZING OR SHORTNESS OF BREATH. 75 mL 0  ? ?No facility-administered medications prior to visit.  ? ? ? ?Review of Systems:  ? ?Constitutional: No weight loss or gain, night sweats, fevers, chills. +fatigue ?HEENT: No headaches, difficulty swallowing, tooth/dental problems, or sore throat. No sneezing, itching, ear ache, nasal congestion, or post nasal  drip ?CV:  No chest pain, orthopnea, PND, swelling in lower extremities, anasarca, dizziness, palpitations, syncope ?Resp: +shortness of breath with minimal exertion; productive cough (white to yellow sputum); occasional wheeze. No hemoptysis. No chest wall deformity ?GI:  No heartburn, indigestion, abdominal pain, nausea, vomiting, diarrhea, change in bowel habits, loss of appetite, bloody stools.  ?Skin: No rash, lesions, ulcerations ?MSK:  No joint pain or swelling.  No decreased range of motion.  No back pain. ?Neuro: No dizziness or lightheadedness.  ?Psych: No depression or anxiety. Mood stable.  ? ? ? ?Physical Exam: ? ?BP 110/80 (BP Location: Left Arm, Cuff Size: Normal)   Pulse (!) 109   Ht 5\' 8"  (1.727 m)   Wt 123 lb 9.6 oz (56.1 kg)   SpO2 97% Comment: 3L  BMI 18.79 kg/m?  ? ?GEN: Pleasant, interactive, chronically-ill appearing; in no acute distress. ?HEENT:  Normocephalic and atraumatic. PERRLA. Sclera white. Nasal turbinates pink,  moist and patent bilaterally. No rhinorrhea present. Oropharynx pink and moist, without exudate or edema. No lesions, ulcerations, or postnasal drip.  ?NECK:  Supple w/ fair ROM. No JVD present. Normal carotid impulses w/o bruits. Thyroid symmetrical with no goiter or nodules palpated. No lymphadenopathy.   ?CV: RRR, no m/r/g, no peripheral edema. Pulses intact, +2 bilaterally. No cyanosis, pallor or clubbing. ?PULMONARY:  Unlabored, regular breathing. Diminished lung sounds bilaterally A&P; minimal end expiratory wheezes. No accessory muscle use. No dullness to percussion. ?GI: BS present and normoactive. Soft, non-tender to palpation.  ?Neuro: A/Ox3. No focal deficits noted.   ?Skin: Warm, no lesions or rashe ?Psych: Normal affect and behavior. Judgement and thought content appropriate.  ? ? ? ?Lab Results: ? ?CBC ?   ?Component Value Date/Time  ? WBC 11.2 (H) 05/22/2018 1148  ? RBC 5.03 05/22/2018 1148  ? HGB 15.0 05/22/2018 1148  ? HGB WILL FOLLOW 08/01/2017 1031  ?  HCT 46.6 05/22/2018 1148  ? HCT WILL FOLLOW 08/01/2017 1031  ? PLT 200 05/22/2018 1148  ? PLT WILL FOLLOW 08/01/2017 1031  ? MCV 92.6 05/22/2018 1148  ? MCV WILL FOLLOW 08/01/2017 1031  ? MCH 29.8 05/22/2018 1148

## 2021-09-28 NOTE — Patient Instructions (Signed)
-  Continue Albuterol inhaler 2 puffs or 3 mL neb every 6 hours as needed for shortness of breath or wheezing. Notify if symptoms persist despite rescue inhaler/neb use. ?-Continue Perforomist 2 mLs neb Twice daily ?-Continue pulmicort 1 vial neb Twice daily. Brush tongue and rinse mouth well afterwards ?-Continue Yupelri 3 mLs neb daily  ?-Continue supplemental oxygen 3 lpm for goal oxygen saturation >88-90% ? ?-Prednisone taper. 4 tabs for 3 days, then 3 tabs for 3 days, 2 tabs for 3 days, then 1 tab for 3 days, then stop. Take in AM with food.  ?-Augmentin 875 mg Twice daily for 7 days. Notify immediately of any rash, itching, hives, or swelling, or seek emergency care.Finish your antibiotics in their entirety. Do not stop just because symptoms improve. Take with food to reduce GI upset. Take daily probiotic while taking  ?-Mucinex 600 mg Twice daily for chest congestion/cough ?-Flutter valve 2-3 times a day after nebulizer treatments  ? ?Chest x ray today. We will notify you of any abnormal results.  ? ?I have attached information about Palliative care per your request should we decide in the near future that this is appropriate.  ? ?If your breathing worsens or you having increased oxygen requirements over the weekend, seek emergency care.  ? ?Follow up in one week with Dr. Craige Cotta or Philis Nettle. If symptoms do not improve or worsen, please contact office for sooner follow up or seek emergency care. ?

## 2021-09-28 NOTE — Assessment & Plan Note (Signed)
On methotrexate. Followed by dermatology.  ?

## 2021-09-29 ENCOUNTER — Other Ambulatory Visit: Payer: Self-pay | Admitting: Pulmonary Disease

## 2021-10-01 ENCOUNTER — Other Ambulatory Visit (HOSPITAL_COMMUNITY): Payer: Self-pay

## 2021-10-01 MED ORDER — YUPELRI 175 MCG/3ML IN SOLN
175.0000 ug | Freq: Every day | RESPIRATORY_TRACT | 3 refills | Status: DC
  Filled 2021-10-01: qty 90, 30d supply, fill #0
  Filled 2021-10-25: qty 90, 30d supply, fill #1
  Filled 2021-11-25: qty 90, 30d supply, fill #2
  Filled 2021-12-27: qty 90, 30d supply, fill #3

## 2021-10-02 NOTE — Progress Notes (Signed)
Reviewed and agree with assessment/plan. ? ? ?Gerald Honea, MD ? Pulmonary/Critical Care ?10/02/2021, 12:13 PM ?Pager:  [phone] ? ?

## 2021-10-05 ENCOUNTER — Ambulatory Visit: Payer: Medicaid Other | Admitting: Nurse Practitioner

## 2021-10-05 ENCOUNTER — Encounter: Payer: Self-pay | Admitting: Nurse Practitioner

## 2021-10-05 ENCOUNTER — Ambulatory Visit (INDEPENDENT_AMBULATORY_CARE_PROVIDER_SITE_OTHER): Payer: Medicaid Other | Admitting: Nurse Practitioner

## 2021-10-05 DIAGNOSIS — R911 Solitary pulmonary nodule: Secondary | ICD-10-CM

## 2021-10-05 DIAGNOSIS — J9611 Chronic respiratory failure with hypoxia: Secondary | ICD-10-CM | POA: Diagnosis not present

## 2021-10-05 DIAGNOSIS — J441 Chronic obstructive pulmonary disease with (acute) exacerbation: Secondary | ICD-10-CM | POA: Diagnosis not present

## 2021-10-05 NOTE — Assessment & Plan Note (Signed)
Stable on 4 lpm supplemental O2. Goal >88-90%.   Walking oximetry today without desaturations.  Was able to maintain saturations in the 90s on 4 L/min.  Advised to monitor at home and seek emergency care if worsening develop. ?

## 2021-10-05 NOTE — Assessment & Plan Note (Signed)
Slowly resolving AECOPD.  Previously treated with prednisone and ceftin; SOB worsened after completing taper and abx course. Repeat CXR 4/7 without acute process.  Placed on augmentin course x 7 days, which she has completed.  Currently on extended prednisone taper. May need to consider daily prednisone if symptoms flare again as taper decreases or he completes it, which he is agreeable to. Continue triple therapy nebs.  Continue therapies for mucociliary clearance with mucinex Twice daily and flutter valve.  Advised that if he worsens or has increasing oxygen requirements over the weekend to go to the emergency room.  Verbalized understanding ? ?Patient Instructions  ?-Continue Albuterol inhaler 2 puffs or 3 mL neb every 6 hours as needed for shortness of breath or wheezing. Notify if symptoms persist despite rescue inhaler/neb use. ?-Continue Perforomist 2 mLs neb Twice daily ?-Continue pulmicort 1 vial neb Twice daily. Brush tongue and rinse mouth well afterwards ?-Continue Yupelri 3 mLs neb daily  ?-Continue supplemental oxygen 3-4 lpm for goal oxygen saturation >88-90% ?-Finish Prednisone taper as previously directed  ?-Mucinex 600 mg Twice daily for chest congestion/cough ?-Flutter valve 2-3 times a day after nebulizer treatments  ? ?Monitor oxygen at home. If you are having levels 88% or less on 4 lpm supplemental O2, please go to the ED for further evaluation.  ?  ?Follow up in 2 weeks with Dr. Craige Cotta or Philis Nettle. If symptoms do not improve or worsen, please contact office for sooner follow up or seek emergency care ? ?

## 2021-10-05 NOTE — Patient Instructions (Addendum)
-  Continue Albuterol inhaler 2 puffs or 3 mL neb every 6 hours as needed for shortness of breath or wheezing. Notify if symptoms persist despite rescue inhaler/neb use. ?-Continue Perforomist 2 mLs neb Twice daily ?-Continue pulmicort 1 vial neb Twice daily. Brush tongue and rinse mouth well afterwards ?-Continue Yupelri 3 mLs neb daily  ?-Continue supplemental oxygen 3-4 lpm for goal oxygen saturation >88-90% ?-Finish Prednisone taper as previously directed  ?-Mucinex 600 mg Twice daily for chest congestion/cough ?-Flutter valve 2-3 times a day after nebulizer treatments  ? ?Monitor oxygen at home. If you are having levels 88% or less on 4 lpm supplemental O2, please go to the ED for further evaluation.  ?  ?Follow up in 2 weeks with Dr. Craige Cotta or Philis Nettle. If symptoms do not improve or worsen, please contact office for sooner follow up or seek emergency care ?

## 2021-10-05 NOTE — Assessment & Plan Note (Signed)
Previous stable RUL nodule, considered benign. LLL nodule resolved on 2020 imaging. Significant smoking history, quit in 2019. Referral to lung cancer screening program placed at last visit  ?

## 2021-10-05 NOTE — Progress Notes (Signed)
? ?@Patient  ID: , male    DOB: December 23, 1960, 61 y.o.   MRN: [id number] ? ?Chief Complaint  ?Patient presents with  ? Follow-up  ?  He is having some clear sputum and is about the same since last visit.   ? ? ?Referring provider: ?[id number], MD ? 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  He is a patient of Dr. 46 and last seen in office on 08/31/2021.  Past medical history significant for chronic pancreatitis, chronic cholecystitis, psoriasis on methotrexate, HLD, depression. ? ?TEST/EVENTS:  ?04/06/2019 PFTs: FVC 85%, FEV1 46%, ratio 57, TLC 107%, DLCOunc 33%.  Severe obstructive airway disease with increased lung volumes and severe diffusion defect.  No BD ?08/31/2021 CXR 2 view: Hyperinflation with emphysema and bronchitic changes.  There was scarring at the lingula, consistent with previous infection.  No acute airspace disease present. ?09/28/2021 CXR 2 view: Hyperinflated lungs.  Mild streaky opacities in the anterior lower lobes bilaterally, unchanged and likely represent scarring or atelectasis. ? ?08/31/2021: OV with Dr. 10/31/2021.  Treated for AECOPD with Ceftin and prednisone.  Chest x-ray was without acute process.  Previously unable to meet criteria for lung transplant due to financial constraints and requirement to live closer to transplant center.  Not a candidate for lung volume reduction per thoracic surgery.  Continued on triple therapy nebs and as needed albuterol.  Advised that he may need chronic low-dose prednisone at some point.  May also need to consider referral to palliative care at some point soon.  Continued on supplemental 3 L/min O2 for SPO2 goal 88 to 90%.   ? ?09/28/2021: OV with Lillianah Swartzentruber NP for worsening shortness of breath and increased productive cough.  He reports his cough is frequent with yellow to white sputum production.  Feels like he just cannot  catch his breath whenever he does simple activities such as showering or chores around the house.  Initially started to feel better on Ceftin and prednisone but once coming off, symptoms started to flareup again.  He does notice an occasional wheeze. He maintains on 3 L/min supplemental O2 without any increasing oxygen demand.  He is using his triple therapy nebs as scheduled and his albuterol a few times a day.  He was curious what stage COPD he has, which we discussed the severity of his symptoms and his lung function is consistent with very severe COPD.  We did discuss the option of involving palliative care for goals of care in the future if he is interested.  He was treated for recurrent AECOPD with repeat prednisone taper and Augmentin course.  Mucociliary clearance therapies were advised.  CXR without acute process.  Referred to lung cancer screening program. ? ?10/05/2021: Today-follow-up ?Patient presents today for follow-up.  Reports feeling relatively the same.  His cough has started to clear up a little bit with clear sputum.  Breathing overall feels like it is unchanged.  He did increase his oxygen up to 4 L/min for shortness of breath since I saw him last.  Has not been checking his oxygen saturations at home.  Denies any recent fevers, chills, hemoptysis, weight loss, lower extremity swelling.  Continues on triple therapy nebs.  Completed Augmentin course.  Remains on prednisone taper. ? ?Allergies  ?Allergen Reactions  ? Nsaids Other (See Comments)  ?  Bleeding gastritis  ? ? ?Immunization History  ?Administered Date(s) Administered  ?  Influenza Whole 02/20/2010  ? Influenza,inj,Quad PF,6+ Mos 04/01/2017, 06/09/2018, 03/18/2019, 03/27/2020  ? Influenza-Unspecified 03/28/2017, 05/07/2021  ? PFIZER(Purple Top)SARS-COV-2 Vaccination 09/17/2019, 10/21/2019, 05/31/2020  ? Tdap 07/27/2009, 05/07/2021  ? ? ?Past Medical History:  ?Diagnosis Date  ? Chronic pancreatitis (HCC)   ? Depression   ? Duodenitis   ?  by EGD 1/07  ? Emphysema   ? unclear diagnosis noted in previous EMR  ? History of alcohol abuse   ? Hyperlipidemia   ? Psoriasis   ? extensive, responds to strong steroids, unable to start molecular therapies due to cost  ? RUE weakness   ? 2/2 cervical spondylosis  ? Stomach ulcer   ? ? ?Tobacco History: ?Social History  ? ?Tobacco Use  ?Smoking Status Former  ? Packs/day: 0.10  ? Years: 40.00  ? Pack years: 4.00  ? Types: Cigarettes  ? Quit date: 05/19/2018  ? Years since quitting: 3.3  ?Smokeless Tobacco Never  ?Tobacco Comments  ? 4-5 cigs per day   ? ?Counseling given: Not Answered ?Tobacco comments: 4-5 cigs per day  ? ? ?Outpatient Medications Prior to Visit  ?Medication Sig Dispense Refill  ? albuterol (VENTOLIN HFA) 108 (90 Base) MCG/ACT inhaler Inhale 2 puffs into the lungs every 6 (six) hours as needed for wheezing or shortness of breath. 18 g 5  ? amitriptyline (ELAVIL) 50 MG tablet Take 1 tablet (50 mg total) by mouth at bedtime. 30 tablet 5  ? DULoxetine (CYMBALTA) 30 MG capsule Take 1 capsule (30 mg total) by mouth 2 (two) times daily. 90 capsule 3  ? formoterol (PERFOROMIST) 20 MCG/2ML nebulizer solution Take 2 mLs (20 mcg total) by nebulization 2 (two) times daily. 120 mL 5  ? hydrOXYzine (ATARAX) 25 MG tablet Take 1 tablet (25 mg total) by mouth 3 (three) times daily as needed. 100 tablet 2  ? methotrexate (RHEUMATREX) 2.5 MG tablet Take 1 tablet (2.5 mg total) by mouth every 12 (twelve) hours for 3 doses (3 tabs per week) 12 tablet 2  ? methotrexate 2.5 MG tablet Take 1 tablet (2.5 mg total) by mouth every 12 hours for 3 doses (3 tablets per week) 12 tablet 2  ? PULMICORT 0.5 MG/2ML nebulizer solution INHALE 1 VIAL BY NEBULIZATION 2 TIMES DAILY. 120 mL 11  ? revefenacin (YUPELRI) 175 MCG/3ML nebulizer solution Take 3 mLs (175 mcg total) by nebulization daily. 90 mL 3  ? traMADol (ULTRAM) 50 MG tablet Take 2 tablets (100 mg total) by mouth every 8 (eight) hours as needed. 180 tablet 0  ? albuterol  (PROVENTIL) (2.5 MG/3ML) 0.083% nebulizer solution TAKE 3 MLS (2.5 MG TOTAL) BY NEBULIZATION EVERY 6 (SIX) HOURS AS NEEDED FOR WHEEZING OR SHORTNESS OF BREATH. 75 mL 0  ? amoxicillin-clavulanate (AUGMENTIN) 875-125 MG tablet Take 1 tablet by mouth 2 (two) times daily for 7 days. (Patient not taking: Reported on 10/05/2021) 14 tablet 0  ? predniSONE (DELTASONE) 10 MG tablet Take 4 tablets (40 mg total) by mouth daily for 3 days, THEN 3 tablets (30 mg total) daily for 3 days, THEN 2 tablets (20 mg total) daily for 3 days, THEN 1 tablet (10 mg total) daily for 3 days. Then stop. (Patient not taking: Reported on 10/05/2021) 30 tablet 0  ? ?No facility-administered medications prior to visit.  ? ? ? ?Review of Systems:  ? ?Constitutional: No weight loss or gain, night sweats, fevers, chills. +fatigue ?HEENT: No headaches, difficulty swallowing, tooth/dental problems, or sore throat. No sneezing, itching, ear ache,  nasal congestion, or post nasal drip ?CV:  No chest pain, orthopnea, PND, swelling in lower extremities, anasarca, dizziness, palpitations, syncope ?Resp: +shortness of breath with minimal exertion; productive cough (clear, improving); occasional wheeze. No hemoptysis. No chest wall deformity ?GI:  No heartburn, indigestion, abdominal pain, nausea, vomiting, diarrhea, change in bowel habits, loss of appetite, bloody stools.  ?Skin: No rash, lesions, ulcerations ?MSK:  No joint pain or swelling.  No decreased range of motion.  No back pain. ?Neuro: No dizziness or lightheadedness.  ?Psych: No depression or anxiety. Mood stable.  ? ? ? ?Physical Exam: ? ?BP 104/72 (BP Location: Left Arm, Patient Position: Sitting, Cuff Size: Normal)   Pulse 81   Temp 98.1 ?F (36.7 ?C) (Oral)   Ht  (1.727 m)   Wt 123 lb (55.8 kg)   SpO2 94%   BMI 18.70 kg/m?  ? ?GEN: Pleasant, interactive, chronically-ill appearing; in no acute distress. ?HEENT:  Normocephalic and atraumatic. PERRLA. Sclera white. Nasal turbinates pink,  moist and patent bilaterally. No rhinorrhea present. Oropharynx pink and moist, without exudate or edema. No lesions, ulcerations, or postnasal drip.  ?NECK:  Supple w/ fair ROM. No JVD present. Normal car

## 2021-10-08 NOTE — Progress Notes (Signed)
Reviewed and agree with assessment/plan. ? ? ?Chesley Mires, MD ?[facility] ?10/08/2021, 9:04 AM ?Pager:  [phone] ? ?

## 2021-10-12 ENCOUNTER — Emergency Department (HOSPITAL_COMMUNITY): Payer: Medicaid Other

## 2021-10-12 ENCOUNTER — Other Ambulatory Visit: Payer: Self-pay

## 2021-10-12 ENCOUNTER — Encounter (HOSPITAL_COMMUNITY): Payer: Self-pay

## 2021-10-12 ENCOUNTER — Telehealth: Payer: Self-pay | Admitting: Nurse Practitioner

## 2021-10-12 ENCOUNTER — Emergency Department (HOSPITAL_COMMUNITY)
Admission: EM | Admit: 2021-10-12 | Discharge: 2021-10-12 | Disposition: A | Discharge status: Home / Self Care | Payer: Medicaid Other | Attending: Emergency Medicine | Admitting: Emergency Medicine

## 2021-10-12 DIAGNOSIS — K861 Other chronic pancreatitis: Secondary | ICD-10-CM | POA: Diagnosis not present

## 2021-10-12 DIAGNOSIS — J449 Chronic obstructive pulmonary disease, unspecified: Secondary | ICD-10-CM | POA: Diagnosis not present

## 2021-10-12 DIAGNOSIS — R1084 Generalized abdominal pain: Secondary | ICD-10-CM | POA: Diagnosis not present

## 2021-10-12 DIAGNOSIS — I700 Atherosclerosis of aorta: Secondary | ICD-10-CM | POA: Insufficient documentation

## 2021-10-12 DIAGNOSIS — R109 Unspecified abdominal pain: Secondary | ICD-10-CM | POA: Diagnosis not present

## 2021-10-12 DIAGNOSIS — R0602 Shortness of breath: Secondary | ICD-10-CM | POA: Diagnosis not present

## 2021-10-12 LAB — CBC
HCT: 43.9 % (ref 39.0–52.0)
Hemoglobin: 14.9 g/dL (ref 13.0–17.0)
MCH: 32.2 pg (ref 26.0–34.0)
MCHC: 33.9 g/dL (ref 30.0–36.0)
MCV: 94.8 fL (ref 80.0–100.0)
Platelets: 204 10*3/uL (ref 150–400)
RBC: 4.63 MIL/uL (ref 4.22–5.81)
RDW: 13.2 % (ref 11.5–15.5)
WBC: 16.7 10*3/uL — ABNORMAL HIGH (ref 4.0–10.5)
nRBC: 0 % (ref 0.0–0.2)

## 2021-10-12 LAB — URINALYSIS, ROUTINE W REFLEX MICROSCOPIC
Bacteria, UA: NONE SEEN
Bilirubin Urine: NEGATIVE
Glucose, UA: NEGATIVE mg/dL
Ketones, ur: NEGATIVE mg/dL
Leukocytes,Ua: NEGATIVE
Nitrite: NEGATIVE
Protein, ur: NEGATIVE mg/dL
RBC / HPF: >50 RBC/hpf — ABNORMAL HIGH (ref 0–5)
Specific Gravity, Urine: 1.015 (ref 1.005–1.030)
pH: 7 (ref 5.0–8.0)

## 2021-10-12 LAB — BASIC METABOLIC PANEL
Anion gap: 12 (ref 5–15)
BUN: 25 mg/dL — ABNORMAL HIGH (ref 6–20)
CO2: 21 mmol/L — ABNORMAL LOW (ref 22–32)
Calcium: 10.2 mg/dL (ref 8.9–10.3)
Chloride: 108 mmol/L (ref 98–111)
Creatinine, Ser: 1.16 mg/dL (ref 0.61–1.24)
GFR, Estimated: >60 mL/min (ref >60)
Glucose, Bld: 96 mg/dL (ref 70–99)
Potassium: 4.5 mmol/L (ref 3.5–5.1)
Sodium: 141 mmol/L (ref 135–145)

## 2021-10-12 LAB — TROPONIN I (HIGH SENSITIVITY)
Troponin I (High Sensitivity): 10 ng/L (ref <18)
Troponin I (High Sensitivity): 10 ng/L (ref <18)

## 2021-10-12 LAB — LIPASE, BLOOD: Lipase: 39 U/L (ref 11–51)

## 2021-10-12 MED ORDER — SODIUM CHLORIDE 0.9 % IV BOLUS
1000.0000 mL | Freq: Once | INTRAVENOUS | Status: AC
  Administered 2021-10-12: 1000 mL via INTRAVENOUS

## 2021-10-12 MED ORDER — HYDROMORPHONE HCL 1 MG/ML IJ SOLN
1.0000 mg | Freq: Once | INTRAMUSCULAR | Status: AC
  Administered 2021-10-12: 1 mg via INTRAVENOUS

## 2021-10-12 MED ORDER — HYDROCODONE-ACETAMINOPHEN 5-325 MG PO TABS: 1.0000 | ORAL_TABLET | Freq: Four times a day (QID) | ORAL | 0 refills | Status: DC | PRN

## 2021-10-12 MED ORDER — ONDANSETRON HCL 4 MG PO TABS: 4.0000 mg | ORAL_TABLET | Freq: Four times a day (QID) | ORAL | 0 refills | Status: DC

## 2021-10-12 MED ORDER — HYDROMORPHONE HCL 1 MG/ML IJ SOLN
1.0000 mg | Freq: Once | INTRAMUSCULAR | Status: DC
  Filled 2021-10-12: qty 1

## 2021-10-12 MED ORDER — HYDROMORPHONE HCL 1 MG/ML IJ SOLN
1.0000 mg | Freq: Once | INTRAMUSCULAR | Status: AC
  Administered 2021-10-12: 1 mg via INTRAVENOUS
  Filled 2021-10-12: qty 1

## 2021-10-12 MED ORDER — IOHEXOL 300 MG/ML  SOLN
100.0000 mL | Freq: Once | INTRAMUSCULAR | Status: AC | PRN
  Administered 2021-10-12: 100 mL via INTRAVENOUS

## 2021-10-12 NOTE — ED Triage Notes (Signed)
Pt arrived via [facility] for c/o RUQ pain and SOB worse the last 2 days. Pt wears 4L 02 at baseline. Pt did home albuterol tx. Pt states gets dyspnea w/exertion that is getting worse. ?

## 2021-10-12 NOTE — ED Provider Notes (Signed)
?[facility] EMERGENCY DEPARTMENT ?Provider Note ? ? ?CSN: [id number] ?Arrival date & time: 10/12/21  1336 ? ?  ? ?History ? ?Chief Complaint  ?Patient presents with  ? Shortness of Breath  ? ? ?[name] is a 61 y.o. male with history of COPD and pancreatitis. Presents ED for evaluation of shortness of breath and abdominal pain.  Patient states that he has been short of breath for the last "2 weeks" and has been seen by his pulmonology team.  Patient pulmonology team has stated that he might need to consider palliative care measures at this point.  Patient recently finished a steroid taper, recently finished antibiotics to include Augmentin, states that he is compliant on daily DuoNeb therapy 4 times daily.  Patient wears 3 L of oxygen at baseline and his oxygen saturation is 100% currently.  Patient also complaining of abdominal pain that started 2 days ago.  The patient's abdominal pain is located diffusely throughout his abdomen but localizes in his epigastric region.  Patient states that the pain is constant describes as a "burning and twisting" sensation.  The patient denies any over-the-counter medication.  Patient states that he has a history of chronic pancreatitis, denies alcohol usage.  Patient denies nausea, vomiting, fevers, diarrhea, blood in stool. ? ? ?Shortness of Breath ?Associated symptoms: abdominal pain   ? ?  ? ?Home Medications ?Prior to Admission medications   ?Medication Sig Start Date End Date Taking? Authorizing Provider  ?albuterol (PROVENTIL) (2.5 MG/3ML) 0.083% nebulizer solution TAKE 3 MLS (2.5 MG TOTAL) BY NEBULIZATION EVERY 6 (SIX) HOURS AS NEEDED FOR WHEEZING OR SHORTNESS OF BREATH. 05/15/20 05/15/21  Coral Ceo, NP  ?albuterol (VENTOLIN HFA) 108 (90 Base) MCG/ACT inhaler Inhale 2 puffs into the lungs every 6 (six) hours as needed for wheezing or shortness of breath. 08/01/21   Coralyn Helling, MD  ?amitriptyline (ELAVIL) 50 MG tablet Take 1 tablet (50 mg total)  by mouth at bedtime. 07/04/21   Andrey Campanile, MD  ?DULoxetine (CYMBALTA) 30 MG capsule Take 1 capsule (30 mg total) by mouth 2 (two) times daily. 07/23/21   Adron Bene, MD  ?formoterol (PERFOROMIST) 20 MCG/2ML nebulizer solution Take 2 mLs (20 mcg total) by nebulization 2 (two) times daily. 07/19/21   Coralyn Helling, MD  ?hydrOXYzine (ATARAX) 25 MG tablet Take 1 tablet (25 mg total) by mouth 3 (three) times daily as needed. 07/16/21     ?methotrexate (RHEUMATREX) 2.5 MG tablet Take 1 tablet (2.5 mg total) by mouth every 12 (twelve) hours for 3 doses (3 tabs per week) 07/26/21     ?methotrexate 2.5 MG tablet Take 1 tablet (2.5 mg total) by mouth every 12 hours for 3 doses (3 tablets per week) 04/16/21     ?PULMICORT 0.5 MG/2ML nebulizer solution INHALE 1 VIAL BY NEBULIZATION 2 TIMES DAILY. 12/12/20 12/12/21  Coralyn Helling, MD  ?revefenacin (YUPELRI) 175 MCG/3ML nebulizer solution Take 3 mLs (175 mcg total) by nebulization daily. 10/01/21 01/29/22  Coralyn Helling, MD  ?traMADol (ULTRAM) 50 MG tablet Take 2 tablets (100 mg total) by mouth every 8 (eight) hours as needed. 09/19/21   Adron Bene, MD  ?sertraline (ZOLOFT) 50 MG tablet Take 1 tablet (50 mg total) by mouth daily. 03/27/20 06/28/20  Theotis Barrio, MD  ?   ? ?Allergies    ?Nsaids   ? ?Review of Systems   ?Review of Systems  ?Respiratory:  Positive for shortness of breath.   ?Gastrointestinal:  Positive for abdominal  pain.  ?All other systems reviewed and are negative. ? ?Physical Exam ?Updated Vital Signs ?BP 110/62   Pulse 84   Temp 97.8 ?F (36.6 ?C) (Oral)   Resp (!) 29   Ht 5\' 8"  (1.727 m)   Wt 55.8 kg   SpO2 98%   BMI 18.70 kg/m?  ?Physical Exam ?Constitutional:   ?   General: He is not in acute distress. ?   Appearance: Normal appearance. He is not ill-appearing, toxic-appearing or diaphoretic.  ?HENT:  ?   Head: Normocephalic and atraumatic.  ?   Nose: Nose normal. No congestion.  ?   Mouth/Throat:  ?   Mouth: Mucous membranes are moist.  ?    Pharynx: Oropharynx is clear.  ?Eyes:  ?   Extraocular Movements: Extraocular movements intact.  ?   Conjunctiva/sclera: Conjunctivae normal.  ?   Pupils: Pupils are equal, round, and reactive to light.  ?Cardiovascular:  ?   Rate and Rhythm: Normal rate and regular rhythm.  ?Pulmonary:  ?   Effort: Pulmonary effort is normal.  ?   Breath sounds: Normal breath sounds. No wheezing.  ?Abdominal:  ?   General: Abdomen is flat. Bowel sounds are normal.  ?   Palpations: Abdomen is soft.  ?   Tenderness: There is abdominal tenderness in the epigastric area.  ?Musculoskeletal:  ?   Cervical back: Normal range of motion and neck supple. No tenderness.  ?Skin: ?   General: Skin is warm and dry.  ?   Capillary Refill: Capillary refill takes less than 2 seconds.  ?Neurological:  ?   Mental Status: He is alert and oriented to person, place, and time.  ? ? ?ED Results / Procedures / Treatments   ?Labs ?(all labs ordered are listed, but only abnormal results are displayed) ?Labs Reviewed  ?BASIC METABOLIC PANEL - Abnormal; Notable for the following components:  ?    Result Value  ? CO2 21 (*)   ? BUN 25 (*)   ? All other components within normal limits  ?CBC - Abnormal; Notable for the following components:  ? WBC 16.7 (*)   ? All other components within normal limits  ?LIPASE, BLOOD  ?URINALYSIS, ROUTINE W REFLEX MICROSCOPIC  ?TROPONIN I (HIGH SENSITIVITY)  ?TROPONIN I (HIGH SENSITIVITY)  ? ? ?EKG ?EKG Interpretation ? ?Date/Time:  Friday October 12 2021 13:41:38 EDT ?Ventricular Rate:  105 ?PR Interval:    ?QRS Duration: 72 ?QT Interval:  342 ?QTC Calculation: 452 ?R Axis:   77 ?Text Interpretation: Accelerated Junctional rhythm Nonspecific ST abnormality Abnormal ECG When compared with ECG of 22-May-2018 11:54, PREVIOUS ECG IS PRESENT Confirmed by Richardean CanalYao, David H [phone]([id number]) on 10/12/2021 4:00:37 PM ? ?Radiology ?DG Chest 2 View ? ?Result Date: 10/12/2021 ?CLINICAL DATA:  Shortness of breath EXAM: CHEST - 2 VIEW COMPARISON:  Previous  studies including the examination of 09/28/2021 FINDINGS: Cardiac size is within normal limits. Low position of diaphragms suggests COPD. Linear densities in the right parahilar region appear stable suggesting scarring. There are no new infiltrates or signs of pulmonary edema. There is no significant pleural effusion or pneumothorax. IMPRESSION: COPD.  There are no new infiltrates or signs of pulmonary edema. Electronically Signed   By: Ernie AvenaPalani  Rathinasamy M.D.   On: 10/12/2021 14:06  ? ?CT Abdomen Pelvis W Contrast ? ?Result Date: 10/12/2021 ?CLINICAL DATA:  Acute abdominal pain.  Chronic pancreatitis. EXAM: CT ABDOMEN AND PELVIS WITH CONTRAST TECHNIQUE: Multidetector CT imaging of the abdomen and pelvis was performed  using the standard protocol following bolus administration of intravenous contrast. RADIATION DOSE REDUCTION: This exam was performed according to the departmental dose-optimization program which includes automated exposure control, adjustment of the mA and/or kV according to patient size and/or use of iterative reconstruction technique. CONTRAST:  OMNIPAQUE IOHEXOL 300 MG/ML  SOLN COMPARISON:  01/12/2017 FINDINGS: Lower Chest: No acute findings. Hepatobiliary: No hepatic masses identified. Prior cholecystectomy. Mild diffuse biliary ductal dilatation remains stable. No obstructing etiology visualized by CT. Pancreas: Pancreatic calcifications are seen in the head and body, which are greatest in the uncinate process. These findings are consistent with chronic pancreatitis. There is no evidence of acute peripancreatic edema or fluid collections. No evidence of pancreatic mass or pancreatic ductal dilatation. Spleen: Within normal limits in size and appearance. Adrenals/Urinary Tract: No masses identified. 5 mm calculus noted in upper pole of right kidney. No evidence of ureteral calculi or hydronephrosis. Stomach/Bowel: No evidence of obstruction, inflammatory process or abnormal fluid  collections. Vascular/Lymphatic: No pathologically enlarged lymph nodes. No acute vascular findings. Aortic atherosclerotic calcification noted. Reproductive:  No mass or other significant abnormality. Other:  None. Musculos

## 2021-10-12 NOTE — ED Notes (Signed)
Patient transported to CT 

## 2021-10-12 NOTE — ED Notes (Signed)
E-signature pad unavailable at time of pt discharge. This RN discussed discharge materials with pt and answered all pt questions. Pt stated understanding of discharge material. ? ?

## 2021-10-12 NOTE — Discharge Instructions (Addendum)
You have chronic pancreatitis. ? ?Please take Tylenol for pain and Vicodin for severe pain. ? ?You can take Zofran for nausea. ? ?You should follow-up with your pulmonologist regarding your COPD and your primary care doctor regarding pancreatitis ? ?Return to ER if you have worse abdominal pain, trouble breathing, vomiting, fever ?

## 2021-10-12 NOTE — Telephone Encounter (Signed)
Patient called in stating he's having a hard time breathing, and his chest is hurting him. He is seeking advice on if he should be seen at [facility], or go to the local ER. Patient states he wants to avoide sitting in ER for a long period of time,so he wants a nurse to call him as soon as possible. [phone]  ?Routing to triage High Priority  ?

## 2021-10-12 NOTE — Telephone Encounter (Signed)
Called and spoke with both pt and his mother about the call and stated to them both if pt's breathing was a lot worse and with other problems that he has going on that he should be evaluated and they both verbalized understanding. Nothing further needed. ?

## 2021-10-15 ENCOUNTER — Telehealth: Payer: Self-pay

## 2021-10-15 ENCOUNTER — Other Ambulatory Visit (HOSPITAL_COMMUNITY): Payer: Self-pay

## 2021-10-15 NOTE — Telephone Encounter (Signed)
Transition Care Management Follow-up Telephone Call ?Date of discharge and from where: 10/12/2021 from [facility] ?How have you been since you were released from the hospital? Patient stated that he is feeling much better. Patient did not have any questions or concerns at this time.  ?Any questions or concerns? No ? ?Items Reviewed: ?Did the pt receive and understand the discharge instructions provided? Yes  ?Medications obtained and verified? Yes  ?Other? No  ?Any new allergies since your discharge? No  ?Dietary orders reviewed? No ?Do you have support at home? Yes  ? ?Functional Questionnaire: (I = Independent and D = Dependent) ?ADLs: I ? ?Bathing/Dressing- I ? ?Meal Prep- I ? ?Eating- I ? ?Maintaining continence- I ? ?Transferring/Ambulation- I ? ?Managing Meds- I ? ? ?Follow up appointments reviewed: ? ?PCP Hospital f/u appt confirmed? No  ?Specialist Hospital f/u appt confirmed? Yes  Scheduled to see Chesley Mires, MD on 10/26/2021 @ 02:45pm. ?Are transportation arrangements needed? No  ?If their condition worsens, is the pt aware to call PCP or go to the Emergency Dept.? Yes ?Was the patient provided with contact information for the PCP's office or ED? Yes ?Was to pt encouraged to call back with questions or concerns? Yes ? ?

## 2021-10-17 ENCOUNTER — Other Ambulatory Visit (HOSPITAL_COMMUNITY): Payer: Self-pay

## 2021-10-17 ENCOUNTER — Other Ambulatory Visit: Payer: Self-pay

## 2021-10-17 DIAGNOSIS — K860 Alcohol-induced chronic pancreatitis: Secondary | ICD-10-CM

## 2021-10-18 ENCOUNTER — Other Ambulatory Visit (HOSPITAL_COMMUNITY): Payer: Self-pay

## 2021-10-18 ENCOUNTER — Other Ambulatory Visit: Payer: Self-pay | Admitting: Internal Medicine

## 2021-10-18 DIAGNOSIS — J432 Centrilobular emphysema: Secondary | ICD-10-CM | POA: Diagnosis not present

## 2021-10-18 DIAGNOSIS — J9611 Chronic respiratory failure with hypoxia: Secondary | ICD-10-CM | POA: Diagnosis not present

## 2021-10-18 DIAGNOSIS — K860 Alcohol-induced chronic pancreatitis: Secondary | ICD-10-CM

## 2021-10-18 NOTE — Telephone Encounter (Signed)
Last [facility] and visit was 09/17/2021. ?

## 2021-10-19 ENCOUNTER — Other Ambulatory Visit (HOSPITAL_COMMUNITY): Payer: Self-pay

## 2021-10-19 MED ORDER — TRAMADOL HCL 50 MG PO TABS
100.0000 mg | ORAL_TABLET | Freq: Three times a day (TID) | ORAL | 0 refills | Status: DC | PRN
  Filled 2021-10-19: qty 180, 30d supply, fill #0
  Filled 2021-10-22 – 2021-10-23 (×3): qty 30, 5d supply, fill #0
  Filled 2021-10-25: qty 150, 25d supply, fill #1
  Filled 2021-10-26 – 2021-10-29 (×2): qty 30, 5d supply, fill #1
  Filled 2021-10-29: qty 150, 25d supply, fill #2
  Filled 2021-10-29: qty 180, 30d supply, fill #0

## 2021-10-22 ENCOUNTER — Telehealth: Payer: Self-pay | Admitting: Internal Medicine

## 2021-10-22 ENCOUNTER — Other Ambulatory Visit (HOSPITAL_COMMUNITY): Payer: Self-pay

## 2021-10-22 NOTE — Telephone Encounter (Signed)
Pt rtn call per Pharmacy.  Pt's states he was unable to get his medication and will need a PA. ? ?traMADol ?traMADol (ULTRAM) 50 MG tablet ?  ?   ?   ?   ?Pharmacy: [facility] (Ph: [phone])  ? ?

## 2021-10-23 ENCOUNTER — Telehealth: Payer: Self-pay

## 2021-10-23 ENCOUNTER — Other Ambulatory Visit (HOSPITAL_COMMUNITY): Payer: Self-pay

## 2021-10-23 NOTE — Telephone Encounter (Signed)
Pa for pt ( TRAMADOL HCL  RENEWAL ) came through cover my meds was submitted with last office notes and UDA .. awaiting approval or denial  ?

## 2021-10-24 ENCOUNTER — Other Ambulatory Visit (HOSPITAL_COMMUNITY): Payer: Self-pay

## 2021-10-24 NOTE — Telephone Encounter (Signed)
Call frompatient concerning PA for Tramadol.  Call to [facility].  PA was denied.  Further information required.  Spoke with Pharmacy for peer to peer evaluation.  Will need additional information sent to [phone].  Attention [place].  Additional information was faxed to [facility].  Awaiting determination after review.  Ref# [id number].Marland Kitchen  Phone # [phone]. ?

## 2021-10-25 ENCOUNTER — Other Ambulatory Visit (HOSPITAL_COMMUNITY): Payer: Self-pay

## 2021-10-25 NOTE — Telephone Encounter (Signed)
DECISION: ? ?APPROVED  ? ? INSURANCE NOTE:    quantity /billable units of 180  ? ? Approved for 10/24/2021 - 10 30/2023 ? ? ? ? ? ? ? ?( COPY SENT TO PHARMACY ALSO )  ? ?  ?

## 2021-10-26 ENCOUNTER — Ambulatory Visit: Payer: Medicaid Other | Admitting: Pulmonary Disease

## 2021-10-26 ENCOUNTER — Other Ambulatory Visit (HOSPITAL_COMMUNITY): Payer: Self-pay

## 2021-10-26 ENCOUNTER — Encounter: Payer: Self-pay | Admitting: Pulmonary Disease

## 2021-10-26 VITALS — BP 118/72 | HR 88 | Ht 68.0 in | Wt 123.4 lb

## 2021-10-26 DIAGNOSIS — R64 Cachexia: Secondary | ICD-10-CM

## 2021-10-26 DIAGNOSIS — J9611 Chronic respiratory failure with hypoxia: Secondary | ICD-10-CM | POA: Diagnosis not present

## 2021-10-26 DIAGNOSIS — J449 Chronic obstructive pulmonary disease, unspecified: Secondary | ICD-10-CM

## 2021-10-26 DIAGNOSIS — Z7189 Other specified counseling: Secondary | ICD-10-CM | POA: Diagnosis not present

## 2021-10-26 NOTE — Progress Notes (Signed)
? ?[facility] ? ?Chief Complaint  ?Patient presents with  ? Follow-up  ?  COPD  ? ? ?Constitutional:  ?BP 118/72   Pulse 88   Ht 5\' 8"  (1.727 m)   Wt 123 lb 6.4 oz (56 kg)   SpO2 95% Comment: on 4L  BMI 18.76 kg/m?  ? ?Past Medical History:  ?Chronic pancreatitis, Depression, ETOH, HLD, Psoriasis, PUD, Rt PTX ? ?Past Surgical History:  ?He  has a past surgical history that includes Laparoscopic cholecystectomy single site with intraoperative cholangiogram (N/A, 11/28/2016); Liver biopsy (N/A, 11/28/2016); Laparoscopic cholecystectomy (11/28/2016); Tympanostomy tube placement (Bilateral, ~ 1970); and Esophagogastroduodenoscopy (egd) with propofol (N/A, 12/04/2016). ? ?Brief Summary:  ?[name] is a 61 y.o. male former smoker with COPD and bullous emphysema. ?  ? ? ? ?Subjective:  ? ?He was seen in the ER a couple weeks ago for abdominal pain.  CXR from 10/12/21 showed changes of COPD and CT abd/pelvis showed chronic pancreatitis. ? ?He feels his breathing is getting worse.  He is having more trouble with his breathing even at rest now.  He is using 4 liters oxygen.  He isn't having much cough or sputum, and isn't having leg swelling.  He is worried about what will happen next if his breathing continues to get worse.  He lives with his mother and worries about she will be able to take care of him, and how he will be able to take care of her. ? ?Physical Exam:  ? ?Appearance - well kempt, thin wearing oxygen ? ?ENMT - no sinus tenderness, no oral exudate, no LAN, Mallampati 3 airway, no stridor ? ?Respiratory - equal breath sounds bilaterally, no wheezing or rales ? ?CV - s1s2 regular rate and rhythm, no murmurs ? ?Ext - no clubbing, no edema ? ?Skin - no rashes ? ?Psych - normal mood and affect ?  ?Pulmonary testing:  ?PFT 07/14/17 >> FEV1 1.69 (48%), FEV1% 41, TLC 7.09 (107%), RV 3.41 (168%), DLCO 32% ?A1AT 05/27/18 >> 192, MM ?ONO with RA 06/09/18 >> test time 8 hrs 59  min.  Baseline SpO2 95%, low SpO2 90% ? ?Chest Imaging:  ?CT angio chest 05/22/18 >> severe emphysema, moderate bullous area Rt upper lung, 2 mm nodule RLL, 7 mm nodule in lingula, 21 mm nodule LLL ?CT chest 09/09/18 >> bullous emphysema, mod/severe centrilobular emphysema, diffuse bronchial wall thickening, resolved nodule LLL, new 6 mm solid nodule LUL, new bandlike consolidation RUP ?CT chest 03/10/19 >> LUL nodule resolved, stable 3 mm nodule RUL ? ?Cardiac Tests:  ?Echo 10/11/16 >> EF 69%, grade 1 DD ? ?Social History:  ?He  reports that he quit smoking about 3 years ago. His smoking use included cigarettes. He has a 4.00 pack-year smoking history. He has never used smokeless tobacco. He reports that he does not currently use alcohol. He reports that he does not currently use drugs after having used the following drugs: Marijuana. ? ?Family History:  ?His family history includes Cancer in his father; Diabetes in his mother; Heart disease in his father. ?  ? ? ?Assessment/Plan:  ? 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 ?- wasn't able to meet criteria for lung transplant due to financial constraints and requirement to live closer to transplant center ?- seen by thoracic surgery and not candidate for lung volume reduction ?- continue yupelri, perforomist and pulmicort ?- prn albuterol ? ?Chronic respiratory failure with hypoxia. ?- goal SpO2 >  90% ?- gets supplies from [employer] ?- using 4 liters 24/7 ?  ?Psoriasis. ?- he is on MTX with dermatology from [facility] ? 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 ?- discussed options to maintain caloric and protein intake; this is limited to some degree because of his chronic pancreatitis ? ?Goals of care. ?- had lengthy discussion with him about natural progression of his disease and what to expect, and that there aren't any therapies available to reverse his disease process ?- had detailed conversation about the role of palliative care and how this is different  than hospice care ?- he would like to learn more about home palliative care services; referral to palliative care placed ? ?Time Spent Involved in Patient Care on Day of Examination:  ?38 minutes ? ?Follow up:  ? ?Patient Instructions  ?Will arrange for referral to palliative care services ? ?Follow up in 4 months ? ?Medication List:  ? ?Allergies as of 10/26/2021   ? ?   Reactions  ? Nsaids Other (See Comments)  ? Bleeding gastritis  ? ?  ? ?  ?Medication List  ?  ? ?  ? Accurate as of Oct 26, 2021  2:51 PM. If you have any questions, ask your nurse or doctor.  ?  ?  ? ?  ? ?STOP taking these medications   ? ?HYDROcodone-acetaminophen 5-325 MG tablet ?Commonly known as: NORCO/VICODIN ?Stopped by: Chesley Mires, MD ?  ? ?  ? ?TAKE these medications   ? ?albuterol (2.5 MG/3ML) 0.083% nebulizer solution ?Commonly known as: PROVENTIL ?TAKE 3 MLS (2.5 MG TOTAL) BY NEBULIZATION EVERY 6 (SIX) HOURS AS NEEDED FOR WHEEZING OR SHORTNESS OF BREATH. ?  ?Ventolin HFA 108 (90 Base) MCG/ACT inhaler ?Generic drug: albuterol ?Inhale 2 puffs into the lungs every 6 (six) hours as needed for wheezing or shortness of breath. ?  ?amitriptyline 50 MG tablet ?Commonly known as: ELAVIL ?Take 1 tablet (50 mg total) by mouth at bedtime. ?  ?DULoxetine 30 MG capsule ?Commonly known as: Cymbalta ?Take 1 capsule (30 mg total) by mouth 2 (two) times daily. ?  ?formoterol 20 MCG/2ML nebulizer solution ?Commonly known as: PERFOROMIST ?Take 2 mLs (20 mcg total) by nebulization 2 (two) times daily. ?  ?hydrOXYzine 25 MG tablet ?Commonly known as: ATARAX ?Take 1 tablet (25 mg total) by mouth 3 (three) times daily as needed. ?  ?methotrexate 2.5 MG tablet ?Take 1 tablet (2.5 mg total) by mouth every 12 hours for 3 doses (3 tablets per week) ?  ?methotrexate 2.5 MG tablet ?Take 1 tablet (2.5 mg total) by mouth every 12 (twelve) hours for 3 doses (3 tabs per week) ?  ?ondansetron 4 MG tablet ?Commonly known as: ZOFRAN ?Take 1 tablet (4 mg total) by mouth  every 6 (six) hours. ?  ?Pulmicort 0.5 MG/2ML nebulizer solution ?Generic drug: budesonide ?INHALE 1 VIAL BY NEBULIZATION 2 TIMES DAILY. ?  ?traMADol 50 MG tablet ?Commonly known as: ULTRAM ?Take 2 tablets (100 mg total) by mouth every 8 (eight) hours as needed. ?  ?Yupelri 175 MCG/3ML nebulizer solution ?Generic drug: revefenacin ?Take 3 mLs (175 mcg total) by nebulization daily. ?  ? ?  ? ? ?Signature:  ?Chesley Mires, MD ?[place] ?Pager - [phone] ?10/26/2021, 2:51 PM ?  ? ? ? ? ? ? ? ? ?

## 2021-10-26 NOTE — Patient Instructions (Signed)
Will arrange for referral to palliative care services ? ?Follow up in 4 months ?

## 2021-10-29 ENCOUNTER — Telehealth: Payer: Self-pay

## 2021-10-29 ENCOUNTER — Other Ambulatory Visit (HOSPITAL_COMMUNITY): Payer: Self-pay

## 2021-10-29 NOTE — Telephone Encounter (Signed)
Spoke with patient and scheduled a telephonic Palliative Consult for 11/05/21 @ 1:30 PM.  ? ?Consent obtained; updated Netsmart, Team List and Epic.  ? ?

## 2021-11-04 ENCOUNTER — Other Ambulatory Visit (HOSPITAL_COMMUNITY): Payer: Self-pay

## 2021-11-05 ENCOUNTER — Other Ambulatory Visit (HOSPITAL_COMMUNITY): Payer: Self-pay

## 2021-11-05 ENCOUNTER — Other Ambulatory Visit: Payer: Medicaid Other | Admitting: Internal Medicine

## 2021-11-05 DIAGNOSIS — J9611 Chronic respiratory failure with hypoxia: Secondary | ICD-10-CM | POA: Diagnosis not present

## 2021-11-05 DIAGNOSIS — F329 Major depressive disorder, single episode, unspecified: Secondary | ICD-10-CM | POA: Diagnosis not present

## 2021-11-05 DIAGNOSIS — F419 Anxiety disorder, unspecified: Secondary | ICD-10-CM | POA: Diagnosis not present

## 2021-11-05 DIAGNOSIS — K860 Alcohol-induced chronic pancreatitis: Secondary | ICD-10-CM | POA: Diagnosis not present

## 2021-11-05 DIAGNOSIS — J439 Emphysema, unspecified: Secondary | ICD-10-CM

## 2021-11-05 DIAGNOSIS — Z515 Encounter for palliative care: Secondary | ICD-10-CM

## 2021-11-05 NOTE — Progress Notes (Signed)
Therapist, nutritional Palliative Care Consult Note Telephone: [phone]  Fax: [phone]   Date of encounter: 11/05/21 1:37 PM PATIENT NAME: [name] [address] [place] [address]   [phone] (home)  DOB: July 09, 1960 MRN: [id number] PRIMARY CARE PROVIDER:    Andrey Campanile, MD,  [address] [place] [address] [phone]  REFERRING PROVIDER:   Andrey Campanile, MD [address] [place],  [place] [address] [phone]  RESPONSIBLE PARTY:    Contact Information     Name Relation Home Work [place] Mother  [phone]       Due to the COVID-19 crisis, this visit was done via telemedicine from my office and it was initiated and consent by this patient and or family.  I connected with  [name] OR PROXY on 11/05/21 by telemedicine application and verified that I am speaking with the correct person using two identifiers.   I discussed the limitations of evaluation and management by telemedicine. The patient expressed understanding and agreed to proceed.  Palliative Care was asked to follow this patient by consultation request of  Samuel Campanile, MD to address advance care planning and complex medical decision making. This is the initial visit.                                     ASSESSMENT AND PLAN / RECOMMENDATIONS:   Advance Care Planning/Goals of Care: Goals include to maximize quality of life and symptom management. Patient/health care surrogate gave his/her permission to discuss.Our advance care planning conversation included a discussion about:    The value and importance of advance care planning  Experiences with loved ones who have been seriously ill or have died  Exploration of personal, cultural or spiritual beliefs that might influence medical decisions  Exploration of goals of care in the event of a sudden injury or illness  Identification  of a healthcare  agent Review and updating or creation of an  advance directive document.  Worries about who would take care of his mom if something happened to him.   Decision not to resuscitate or to de-escalate disease focused treatments due to poor prognosis. CODE STATUS:  Has DNR order scanned in vynca; we will complete MOST at his visit and also may need HCPOA document   Symptom Management/Plan: 1. Chronic respiratory failure with hypoxia (HCC) -continue 4L O2 and 5 with exertion  2. Giant bullous emphysema (HCC) -continue albuterol HFA and discussed using spacer, albuterol nebs, formoterol nebs bid, pulmicort nebs bid, revefenacin nebs daily for this and oxygen -h/o pneumothorax in the past -is progressing gradually and he's having more challenges with dyspnea, anxiety, fatigue, and has not had an appetite for years already  3. Reactive depression -not improved per pt with cymbalta and amitriptyline regimen -seems he's primarily anxious but these have not taken the edge off and it's worsened in view of progressive dyspnea  4. Anxiety -he is wanting to be back on a benzo to help with his anxiety and feels like the amitryptyline and cymbalta have not been helpful (have been long term and zoloft also did not help)--would suggest lorazepam 0.5mg  po bid prn anxiety and dyspnea--will send a message with my note to pulmonary to see if they're on board with that ([name])  5. Palliative care by specialist -continue to follow disease progression over time -educated  on palliative vs hospice -discussed ACP and different forms  -will plan on MOST and will provide living will and hcpoa to him at time of visit, as well  6.  Chronic pancreatitis:  discussed alternative smoothie options that would be safe in view of this and would allow for some reprieve from his same old strawberry shakes, but he seems content with these   Follow up Palliative Care Visit: Palliative care will continue to follow for complex  medical decision making, advance care planning, and clarification of goals. Return 01/01/2022 and prn.  This visit was coded based on medical decision making (MDM).  PPS: 40%  HOSPICE ELIGIBILITY/DIAGNOSIS: TBD  Chief Complaint: initial palliative care consult  HISTORY OF PRESENT ILLNESS:  [name] is a 61 y.o. year old male  with bullous emphysema/COPD with prior pneumothorax who was not a candidate for lung transplant, chronic respiratory failure with hypoxia on 4L O2 via [place], prior tobacco and alcohol abuse, chronic pancreatitis, cachexia with protein calorie malnutrition, peptic ulcer disease and depression seen for initial palliative care consult.    When chronic pancreatitis flares, it makes breathing terrible.  He is using 4L O2 now and has to turn up to 5L to walk.  Has had COPD for years.  He was not eligible for lung transplants due to not being within an hour of [place].  Lives with his mom and they look after each other.  LUng disease is getting worse.  He drinks ensure, but does not eat meals.  Keeps weight up to 123 lbs.  Eats saltines and strawberry ensure.  Has some chronic pain from the pancreatitis.  Hasn't drunk alcohol in probably 10 yrs.  He quit smoking in 2019.  He has to sit to shower.  If he's out and has to walk far, he uses albuterol.    He uses methotrexate for psoriasis.  Uses tramadol for pain for the pancreatitis.  Had to [employer] about it.  Will occasionally get some bleeding from his bleeding ulcers also.    His depression is pretty bad.  Cymbalta is not working very well.  Zoloft didn't work well before.  Also on elavil at hs.  Gets anxious when he's short of breath and paranoid.  He worries about things that happened 40 yrs ago.  He had been on xanax in the past.    His mom is still driving and pretty functional outside of some short-term memory loss.     [employer]'s Chronic Pancreatitis Cookbook recommended.   He only  goes b/w his rocking chair and the refrigerator.  He does want to stay in the home as long as possible.  If he got bedridden, his mom likely could not care for him there.  History obtained from review of EMR, discussion with primary team, and interview with family, facility staff/caregiver and/or Mr. Martin.  I reviewed available labs, medications, imaging, studies and related documents from the EMR.  Records reviewed and summarized above.   ROS General: NAD EYES: denies vision changes ENMT: denies dysphagia Cardiovascular: denies chest pain, has DOE Pulmonary: has cough, has increased SOB Abdomen: endorses very poor appetite (not new), denies constipation, endorses continence of bowel GU: denies dysuria, endorses continence of urine MSK:  denies increased weakness,  no falls reported Skin: denies rashes or wounds Neurological: denies pain, denies insomnia Psych: Endorses depression and anxiety Heme/lymph/immuno: easily bruises, abnormal bleeding  Physical Exam: Current and past weights:  123 lbs fairly consistently but may  drop down 3 lbs during pancreatitis flares.  CURRENT PROBLEM LIST:  Patient Active Problem List   Diagnosis Date Noted   Chronic respiratory failure with hypoxia (HCC) 09/28/2021   COPD exacerbation (HCC) 09/17/2021   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   Dyspnea on exertion 05/15/2020   Healthcare maintenance 05/15/2020   Goals of care, counseling/discussion 11/09/2019   Depression    Preventative health care 06/10/2018   Lung nodule 05/26/2018   COPD with emphysema (HCC)    History of pneumothorax 05/19/2017   Chronic cholecystitis with calculus s/p lap cholecystectomy 11/28/2016 11/28/2016   Psoriasis    Chronic pancreatitis due to acute alcohol intoxication (HCC) 04/13/2008   Dyslipidemia 08/13/2007   PAST MEDICAL HISTORY:  Active Ambulatory Problems    Diagnosis Date Noted   Dyslipidemia 08/13/2007   Chronic pancreatitis due to acute alcohol  intoxication (HCC) 04/13/2008   Chronic cholecystitis with calculus s/p lap cholecystectomy 11/28/2016 11/28/2016   Psoriasis    History of pneumothorax 05/19/2017   COPD with emphysema (HCC)    Lung nodule 05/26/2018   Preventative health care 06/10/2018   Depression    Goals of care, counseling/discussion 11/09/2019   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   Dyspnea on exertion 05/15/2020   Healthcare maintenance 05/15/2020   COPD exacerbation (HCC) 09/17/2021   Chronic respiratory failure with hypoxia (HCC) 09/28/2021   Resolved Ambulatory Problems    Diagnosis Date Noted   HYPERKALEMIA 02/20/2010   Anxiety state, unspecified 08/29/2008   ALCOHOL ABUSE 04/23/2007   EROSIVE GASTRITIS 02/24/2008   BLOOD IN STOOL, OCCULT 01/18/2008   PANCREATITIS, HX OF 04/23/2007   MELENA, HX OF 11/26/2007   Alcoholic fatty liver 11/28/2016   Duodenitis    Acute on chronic respiratory failure with hypoxia (HCC)    Pneumothorax, right    S/P chest tube placement (HCC)    Acute respiratory failure with hypoxia (HCC)    Tobacco abuse    Acute bronchitis 05/26/2018   Past Medical History:  Diagnosis Date   Chronic pancreatitis (HCC)    Duodenitis    Emphysema    History of alcohol abuse    Hyperlipidemia    RUE weakness    Stomach ulcer    SOCIAL HX:  Social History   Tobacco Use   Smoking status: Former    Packs/day: 0.10    Years: 40.00    Pack years: 4.00    Types: Cigarettes    Quit date: 05/19/2018    Years since quitting: 3.4   Smokeless tobacco: Never   Tobacco comments:    4-5 cigs per day   Substance Use Topics   Alcohol use: Not Currently    Comment: quit x 10 yrs.   FAMILY HX:  Family History  Problem Relation Age of Onset   Diabetes Mother    Heart disease Father    Cancer Father        ?kidney or liver cancer      ALLERGIES:  Allergies  Allergen Reactions   Nsaids Other (See Comments)    Bleeding gastritis     PERTINENT MEDICATIONS:  Outpatient  Encounter Medications as of 11/05/2021  Medication Sig   albuterol (PROVENTIL) (2.5 MG/3ML) 0.083% nebulizer solution TAKE 3 MLS (2.5 MG TOTAL) BY NEBULIZATION EVERY 6 (SIX) HOURS AS NEEDED FOR WHEEZING OR SHORTNESS OF BREATH.   albuterol (VENTOLIN HFA) 108 (90 Base) MCG/ACT inhaler Inhale 2 puffs into the lungs every 6 (six) hours as needed for wheezing or shortness of breath.  amitriptyline (ELAVIL) 50 MG tablet Take 1 tablet (50 mg total) by mouth at bedtime.   DULoxetine (CYMBALTA) 30 MG capsule Take 1 capsule (30 mg total) by mouth 2 (two) times daily.   formoterol (PERFOROMIST) 20 MCG/2ML nebulizer solution Take 2 mLs (20 mcg total) by nebulization 2 (two) times daily.   hydrOXYzine (ATARAX) 25 MG tablet Take 1 tablet (25 mg total) by mouth 3 (three) times daily as needed.   methotrexate (RHEUMATREX) 2.5 MG tablet Take 1 tablet (2.5 mg total) by mouth every 12 (twelve) hours for 3 doses (3 tabs per week)   methotrexate 2.5 MG tablet Take 1 tablet (2.5 mg total) by mouth every 12 hours for 3 doses (3 tablets per week)   ondansetron (ZOFRAN) 4 MG tablet Take 1 tablet (4 mg total) by mouth every 6 (six) hours.   PULMICORT 0.5 MG/2ML nebulizer solution INHALE 1 VIAL BY NEBULIZATION 2 TIMES DAILY.   revefenacin (YUPELRI) 175 MCG/3ML nebulizer solution Take 3 mLs (175 mcg total) by nebulization daily.   traMADol (ULTRAM) 50 MG tablet Take 2 tablets (100 mg total) by mouth every 8 (eight) hours as needed.   [DISCONTINUED] sertraline (ZOLOFT) 50 MG tablet Take 1 tablet (50 mg total) by mouth daily.   No facility-administered encounter medications on file as of 11/05/2021.   Thank you for the opportunity to participate in the care of Mr. Wiseman.  The palliative care team will continue to follow. Please call our office at [phone] if we can be of additional assistance.   Bufford Spikes, DO   COVID-19 PATIENT SCREENING TOOL Asked and negative response unless otherwise noted:  Have you had  symptoms of covid, tested positive or been in contact with someone with symptoms/positive test in the past 5-10 days?  no

## 2021-11-06 ENCOUNTER — Other Ambulatory Visit (HOSPITAL_COMMUNITY): Payer: Self-pay

## 2021-11-07 ENCOUNTER — Other Ambulatory Visit (HOSPITAL_COMMUNITY): Payer: Self-pay

## 2021-11-07 ENCOUNTER — Encounter: Payer: Self-pay | Admitting: Internal Medicine

## 2021-11-08 ENCOUNTER — Other Ambulatory Visit (HOSPITAL_COMMUNITY): Payer: Self-pay

## 2021-11-09 ENCOUNTER — Other Ambulatory Visit: Payer: Self-pay | Admitting: Internal Medicine

## 2021-11-09 ENCOUNTER — Other Ambulatory Visit (HOSPITAL_COMMUNITY): Payer: Self-pay

## 2021-11-09 DIAGNOSIS — J439 Emphysema, unspecified: Secondary | ICD-10-CM

## 2021-11-09 DIAGNOSIS — J9611 Chronic respiratory failure with hypoxia: Secondary | ICD-10-CM

## 2021-11-09 DIAGNOSIS — F419 Anxiety disorder, unspecified: Secondary | ICD-10-CM

## 2021-11-09 MED ORDER — LORAZEPAM 0.5 MG PO TABS
0.5000 mg | ORAL_TABLET | Freq: Two times a day (BID) | ORAL | 5 refills | Status: DC | PRN
  Filled 2021-11-09: qty 60, 30d supply, fill #0
  Filled 2021-12-06: qty 30, 15d supply, fill #1
  Filled 2022-01-06: qty 30, 15d supply, fill #2
  Filled 2022-01-08: qty 60, 30d supply, fill #2
  Filled 2022-02-06: qty 60, 30d supply, fill #3
  Filled 2022-03-03 – 2022-03-06 (×2): qty 60, 30d supply, fill #4
  Filled 2022-04-01 – 2022-04-03 (×2): qty 60, 30d supply, fill #5

## 2021-11-12 ENCOUNTER — Other Ambulatory Visit (HOSPITAL_COMMUNITY): Payer: Self-pay

## 2021-11-13 ENCOUNTER — Other Ambulatory Visit (HOSPITAL_COMMUNITY): Payer: Self-pay

## 2021-11-15 ENCOUNTER — Other Ambulatory Visit (HOSPITAL_COMMUNITY): Payer: Self-pay

## 2021-11-21 ENCOUNTER — Other Ambulatory Visit (HOSPITAL_COMMUNITY): Payer: Self-pay

## 2021-11-26 ENCOUNTER — Other Ambulatory Visit: Payer: Self-pay | Admitting: Internal Medicine

## 2021-11-26 ENCOUNTER — Other Ambulatory Visit (HOSPITAL_COMMUNITY): Payer: Self-pay

## 2021-11-26 DIAGNOSIS — K860 Alcohol-induced chronic pancreatitis: Secondary | ICD-10-CM

## 2021-11-26 NOTE — Telephone Encounter (Signed)
Pt req a Refill  traMADol (ULTRAM) 50 MG tablet   [facility]

## 2021-11-27 ENCOUNTER — Other Ambulatory Visit: Payer: Self-pay

## 2021-11-27 ENCOUNTER — Other Ambulatory Visit (HOSPITAL_COMMUNITY): Payer: Self-pay

## 2021-11-28 ENCOUNTER — Other Ambulatory Visit (HOSPITAL_COMMUNITY): Payer: Self-pay

## 2021-11-28 MED ORDER — DULOXETINE HCL 30 MG PO CPEP
30.0000 mg | ORAL_CAPSULE | Freq: Two times a day (BID) | ORAL | 3 refills | Status: DC
Start: 2021-11-28 — End: 2023-11-05
  Filled 2021-11-28 – 2021-12-06 (×2): qty 180, 90d supply, fill #0
  Filled 2022-02-18: qty 180, 90d supply, fill #1
  Filled 2022-05-15: qty 180, 90d supply, fill #2
  Filled 2022-08-06: qty 180, 90d supply, fill #3

## 2021-11-28 MED ORDER — TRAMADOL HCL 50 MG PO TABS
100.0000 mg | ORAL_TABLET | Freq: Three times a day (TID) | ORAL | 0 refills | Status: DC | PRN
  Filled 2021-11-28: qty 180, 30d supply, fill #0

## 2021-11-29 ENCOUNTER — Other Ambulatory Visit (HOSPITAL_COMMUNITY): Payer: Self-pay

## 2021-12-06 ENCOUNTER — Other Ambulatory Visit: Payer: Self-pay | Admitting: Internal Medicine

## 2021-12-06 DIAGNOSIS — F32A Depression, unspecified: Secondary | ICD-10-CM

## 2021-12-07 ENCOUNTER — Other Ambulatory Visit (HOSPITAL_COMMUNITY): Payer: Self-pay

## 2021-12-07 MED ORDER — AMITRIPTYLINE HCL 50 MG PO TABS
50.0000 mg | ORAL_TABLET | Freq: Every day | ORAL | 5 refills | Status: DC
  Filled 2021-12-07: qty 10, 10d supply, fill #0
  Filled 2021-12-07: qty 20, 20d supply, fill #0
  Filled 2022-01-06: qty 30, 30d supply, fill #1
  Filled 2022-02-06: qty 30, 30d supply, fill #2
  Filled 2022-03-03: qty 30, 30d supply, fill #3
  Filled 2022-04-01: qty 30, 30d supply, fill #4
  Filled 2022-04-28: qty 30, 30d supply, fill #5

## 2021-12-10 ENCOUNTER — Other Ambulatory Visit (HOSPITAL_COMMUNITY): Payer: Self-pay

## 2021-12-15 ENCOUNTER — Encounter: Payer: Self-pay | Admitting: *Deleted

## 2021-12-17 ENCOUNTER — Other Ambulatory Visit (HOSPITAL_COMMUNITY): Payer: Self-pay

## 2021-12-17 ENCOUNTER — Other Ambulatory Visit: Payer: Medicaid Other | Admitting: *Deleted

## 2021-12-18 DIAGNOSIS — J432 Centrilobular emphysema: Secondary | ICD-10-CM | POA: Diagnosis not present

## 2021-12-18 DIAGNOSIS — J9611 Chronic respiratory failure with hypoxia: Secondary | ICD-10-CM | POA: Diagnosis not present

## 2021-12-19 ENCOUNTER — Other Ambulatory Visit: Payer: Self-pay | Admitting: Pulmonary Disease

## 2021-12-19 ENCOUNTER — Other Ambulatory Visit (HOSPITAL_COMMUNITY): Payer: Self-pay

## 2021-12-19 MED ORDER — PULMICORT 0.5 MG/2ML IN SUSP
0.5000 mg | Freq: Two times a day (BID) | RESPIRATORY_TRACT | 11 refills | Status: DC
  Filled 2021-12-19: qty 120, 30d supply, fill #0
  Filled 2022-01-16: qty 120, 30d supply, fill #1
  Filled 2022-02-13: qty 120, 30d supply, fill #2
  Filled 2022-03-14: qty 120, 30d supply, fill #3
  Filled 2022-04-12: qty 120, 30d supply, fill #4
  Filled 2022-05-13: qty 120, 30d supply, fill #5
  Filled 2022-06-13 – 2022-06-14 (×2): qty 120, 30d supply, fill #6
  Filled 2022-07-14: qty 120, 30d supply, fill #7
  Filled 2022-08-08: qty 120, 30d supply, fill #8
  Filled 2022-09-05: qty 120, 30d supply, fill #9
  Filled 2022-10-02: qty 120, 30d supply, fill #10

## 2021-12-27 ENCOUNTER — Other Ambulatory Visit (HOSPITAL_COMMUNITY): Payer: Self-pay

## 2021-12-30 ENCOUNTER — Other Ambulatory Visit: Payer: Self-pay

## 2021-12-31 ENCOUNTER — Other Ambulatory Visit (HOSPITAL_COMMUNITY): Payer: Self-pay

## 2021-12-31 ENCOUNTER — Other Ambulatory Visit: Payer: Self-pay | Admitting: Internal Medicine

## 2021-12-31 DIAGNOSIS — K860 Alcohol-induced chronic pancreatitis: Secondary | ICD-10-CM

## 2022-01-01 ENCOUNTER — Other Ambulatory Visit: Payer: Medicaid Other | Admitting: Internal Medicine

## 2022-01-01 ENCOUNTER — Other Ambulatory Visit (HOSPITAL_COMMUNITY): Payer: Self-pay

## 2022-01-01 VITALS — BP 100/70 | HR 93 | Temp 97.9°F

## 2022-01-01 DIAGNOSIS — J9611 Chronic respiratory failure with hypoxia: Secondary | ICD-10-CM

## 2022-01-01 DIAGNOSIS — F329 Major depressive disorder, single episode, unspecified: Secondary | ICD-10-CM

## 2022-01-01 DIAGNOSIS — Z515 Encounter for palliative care: Secondary | ICD-10-CM

## 2022-01-01 DIAGNOSIS — J439 Emphysema, unspecified: Secondary | ICD-10-CM | POA: Diagnosis not present

## 2022-01-01 DIAGNOSIS — F419 Anxiety disorder, unspecified: Secondary | ICD-10-CM

## 2022-01-01 DIAGNOSIS — E43 Unspecified severe protein-calorie malnutrition: Secondary | ICD-10-CM | POA: Diagnosis not present

## 2022-01-01 NOTE — Progress Notes (Signed)
[facility] Follow-Up Visit Telephone: [phone]  Fax: [phone]   Date of encounter: 01/01/22 3:25 PM PATIENT NAME: [name] [address] [place] [address]   [phone] (home)  DOB: Jul 18, 1960 MRN: [id number] PRIMARY CARE PROVIDER:    Nani Gasser, MD,  [place] [address] [phone]  REFERRING PROVIDER:   Orvis Brill, MD No address on file N/A  RESPONSIBLE PARTY:    Contact Information     Name Relation Home Work [place] Mother  [phone]        I met face to face with patient and family in his home. Palliative Care was asked to follow this patient by consultation request of  Orvis Brill, MD to address advance care planning and complex medical decision making. This is follow-up visit.                                     ASSESSMENT AND PLAN / RECOMMENDATIONS:   Advance Care Planning/Goals of Care: Goals include to maximize quality of life and symptom management. Patient/health care surrogate gave his/her permission to discuss.Our advance care planning conversation included a discussion about:    The value and importance of advance care planning  Experiences with loved ones who have been seriously ill or have died  Exploration of personal, cultural or spiritual beliefs that might influence medical decisions  Exploration of goals of care in the event of a sudden injury or illness  Identification  of a healthcare agent--son would be primary decision maker; mother also contributes but has memory loss (he lives with her); living will/hcpoa blank form provided and educated on need for notary and two witnesses to finalize and recommended he provide [place] with a copy to upload to his chart Review and updating or creation of an  advance directive document . Decision not to resuscitate or to de-escalate disease focused treatments due to poor  prognosis. CODE STATUS:  DNR; MOST completed today:  DNR, limited additional interventions, determine use or limitation of abx when infection occurs, IVF trial and no feeding tube  Symptom Management/Plan: 1. Chronic respiratory failure with hypoxia (HCC) -uses O2 at hs about every 3rd night and prn, not 24x7 -runs tachycardic and tachypneic though sats wnl  2. Giant bullous emphysema ([facility]) -continue current therapy -has increased phlegm but color and character the same and no other symptoms suggestive of exacerbation -pt is on SSI and would like to get full disability--he did not recall having a Education officer, museum for SSI   3. Anxiety -cont bid prn lorazepam which he reports has helped -it's unclear if his mother witnessed overuse of this or is thinking of a past event as she was very repetitive and admitted to memory loss during the visit  4. Reactive depression -may benefit from increase in cymbalta if primary team agrees  5. Severe protein-calorie malnutrition ([facility]) -continues to live off of 6-7 ensure per day and a gatorade; bowels do ok, if he eats anything, it seems to cause a flare of chronic pancreatitis so he stopped trying  -he's not had alcohol in 4-5 yrs  6. Palliative care by specialist -MOST completed today -continue to follow every few months to see how he does -no current exacerbation   Follow up Palliative Care Visit: Palliative care will continue to follow for complex medical decision making, advance  care planning, and clarification of goals. Return 02/21/2022  And prn.   This visit was coded based on medical decision making (MDM).22 mins spent on ACP.  PPS: 50%  HOSPICE ELIGIBILITY/DIAGNOSIS: TBD  Chief Complaint: Follow-up palliative visit  HISTORY OF PRESENT ILLNESS:  [name] is a 61 y.o. year old male  with severe COPD, protein calorie malnutrition, depression, anxiety, and h/o recurrent pancreatitis seen in his home along with his mother for  palliative f/u .   Got a message yesterday from his mother with concerns that he has not been taking his lorazepam as prescribed--taking too much.  He's been confused and was actually found sleeping on the floor.    History obtained from review of EMR, discussion with primary team, and interview with family, facility staff/caregiver and/or [name].  I reviewed available labs, medications, imaging, studies and related documents from the EMR.  Records reviewed and summarized above.   ROS Review of Systemssee hpi  Physical Exam: Vitals:   01/05/22 1514  BP: 100/70  Pulse: 93  Temp: 97.9 F (36.6 C)  SpO2: 98%   There is no height or weight on file to calculate BMI. Wt Readings from Last 500 Encounters:  10/26/21 123 lb 6.4 oz (56 kg)  10/12/21 123 lb 0.3 oz (55.8 kg)  10/05/21 123 lb (55.8 kg)  09/28/21 123 lb 9.6 oz (56.1 kg)  09/17/21 124 lb 12.8 oz (56.6 kg)  08/31/21 127 lb 9.6 oz (57.9 kg)  07/20/21 126 lb 1.6 oz (57.2 kg)  05/07/21 122 lb 8 oz (55.6 kg)  01/18/21 122 lb 6.4 oz (55.5 kg)  07/17/20 119 lb 9.6 oz (54.3 kg)  06/28/20 121 lb 14.4 oz (55.3 kg)  05/15/20 121 lb 12.8 oz (55.2 kg)  03/27/20 124 lb 6.4 oz (56.4 kg)  11/09/19 127 lb 11.2 oz (57.9 kg)  07/16/19 130 lb (59 kg)  07/06/19 130 lb 9.6 oz (59.2 kg)  03/26/19 132 lb 9.6 oz (60.1 kg)  03/18/19 132 lb 3.2 oz (60 kg)  06/29/18 112 lb 3.2 oz (50.9 kg)  06/09/18 106 lb 8 oz (48.3 kg)  05/27/18 110 lb (49.9 kg)  05/25/18 110 lb 11.2 oz (50.2 kg)  05/22/18 120 lb (54.4 kg)  02/03/18 106 lb 14.4 oz (48.5 kg)  01/09/18 109 lb (49.4 kg)  12/02/17 111 lb 3.2 oz (50.4 kg)  10/28/17 113 lb 4.8 oz (51.4 kg)  08/26/17 116 lb (52.6 kg)  08/01/17 114 lb 8 oz (51.9 kg)  07/07/17 116 lb (52.6 kg)  06/19/17 121 lb (54.9 kg)  06/06/17 121 lb 6.4 oz (55.1 kg)  05/24/17 119 lb 11.2 oz (54.3 kg)  01/12/17 128 lb (58.1 kg)  12/04/16 132 lb (59.9 kg)  11/28/16 140 lb (63.5 kg)  11/26/16 140 lb (63.5 kg)  11/06/16  138 lb (62.6 kg)  10/14/16 138 lb 6.4 oz (62.8 kg)  09/02/16 130 lb (59 kg)  11/04/12 130 lb (59 kg)  02/02/12 127 lb (57.6 kg)  04/08/11 135 lb 9.6 oz (61.5 kg)  03/14/11 135 lb 12.8 oz (61.6 kg)  12/06/10 131 lb 1.6 oz (59.5 kg)  11/15/10 133 lb (60.3 kg)  09/27/10 126 lb 14.4 oz (57.6 kg)  07/27/10 124 lb 11.2 oz (56.6 kg)   Physical Exam Constitutional:      Appearance: He is ill-appearing.     Comments: Cachectic male  HENT:     Head: Normocephalic and atraumatic.  Cardiovascular:     Rate and Rhythm: Regular  rhythm. Tachycardia present.     Heart sounds: No murmur heard. Pulmonary:     Effort: Pulmonary effort is normal.     Breath sounds: Rhonchi present. No wheezing.  Abdominal:     General: Bowel sounds are normal.  Musculoskeletal:        General: Normal range of motion.     Right lower leg: No edema.     Left lower leg: No edema.  Skin:    General: Skin is warm and dry.  Neurological:     General: No focal deficit present.     Mental Status: He is alert and oriented to person, place, and time.     Comments: He repeated himself a lot also  Psychiatric:        Mood and Affect: Mood normal.     Comments: Notably less anxious and jittery     CURRENT PROBLEM LIST:  Patient Active Problem List   Diagnosis Date Noted   Chronic respiratory failure with hypoxia ([facility]) 09/28/2021   COPD exacerbation ([facility]) 09/17/2021   Giant bullous emphysema ([facility]) 05/15/2020   Dyspnea on exertion 05/15/2020   Healthcare maintenance 05/15/2020   Goals of care, counseling/discussion 11/09/2019   Depression    Preventative health care 06/10/2018   Lung nodule 05/26/2018   COPD with emphysema ([facility])    History of pneumothorax 05/19/2017   Chronic cholecystitis with calculus s/p lap cholecystectomy 11/28/2016 11/28/2016   Psoriasis    Chronic pancreatitis due to acute alcohol intoxication ([facility]) 04/13/2008   Dyslipidemia 08/13/2007    PAST MEDICAL HISTORY:  Active Ambulatory  Problems    Diagnosis Date Noted   Dyslipidemia 08/13/2007   Chronic pancreatitis due to acute alcohol intoxication ([facility]) 04/13/2008   Chronic cholecystitis with calculus s/p lap cholecystectomy 11/28/2016 11/28/2016   Psoriasis    History of pneumothorax 05/19/2017   COPD with emphysema ([facility])    Lung nodule 05/26/2018   Preventative health care 06/10/2018   Depression    Goals of care, counseling/discussion 11/09/2019   Giant bullous emphysema ([facility]) 05/15/2020   Dyspnea on exertion 05/15/2020   Healthcare maintenance 05/15/2020   COPD exacerbation ([facility]) 09/17/2021   Chronic respiratory failure with hypoxia ([facility]) 09/28/2021   Resolved Ambulatory Problems    Diagnosis Date Noted   HYPERKALEMIA 02/20/2010   Anxiety state, unspecified 08/29/2008   ALCOHOL ABUSE 04/23/2007   EROSIVE GASTRITIS 02/24/2008   BLOOD IN STOOL, OCCULT 01/18/2008   PANCREATITIS, HX OF 04/23/2007   MELENA, HX OF 80/22/3361   Alcoholic fatty liver 22/44/9753   Duodenitis    Acute on chronic respiratory failure with hypoxia (HCC)    Pneumothorax, right    S/P chest tube placement (HCC)    Acute respiratory failure with hypoxia (HCC)    Tobacco abuse    Acute bronchitis 05/26/2018   Past Medical History:  Diagnosis Date   Chronic pancreatitis ([facility])    Duodenitis    Emphysema    History of alcohol abuse    Hyperlipidemia    RUE weakness    Stomach ulcer     SOCIAL HX:  Social History   Tobacco Use   Smoking status: Former    Packs/day: 0.10    Years: 40.00    Total pack years: 4.00    Types: Cigarettes    Quit date: 05/19/2018    Years since quitting: 3.6   Smokeless tobacco: Never   Tobacco comments:    4-5 cigs per day   Substance Use Topics   Alcohol  use: Not Currently    Comment: quit x 10 yrs.     ALLERGIES:  Allergies  Allergen Reactions   Nsaids Other (See Comments)    Bleeding gastritis      PERTINENT MEDICATIONS:  Outpatient Encounter Medications as of 01/01/2022   Medication Sig   albuterol (PROVENTIL) (2.5 MG/3ML) 0.083% nebulizer solution TAKE 3 MLS (2.5 MG TOTAL) BY NEBULIZATION EVERY 6 (SIX) HOURS AS NEEDED FOR WHEEZING OR SHORTNESS OF BREATH.   albuterol (VENTOLIN HFA) 108 (90 Base) MCG/ACT inhaler Inhale 2 puffs into the lungs every 6 (six) hours as needed for wheezing or shortness of breath.   amitriptyline (ELAVIL) 50 MG tablet Take 1 tablet (50 mg total) by mouth at bedtime.   DULoxetine (CYMBALTA) 30 MG capsule Take 1 capsule (30 mg total) by mouth 2 (two) times daily.   formoterol (PERFOROMIST) 20 MCG/2ML nebulizer solution Take 2 mLs (20 mcg total) by nebulization 2 (two) times daily.   hydrOXYzine (ATARAX) 25 MG tablet Take 1 tablet (25 mg total) by mouth 3 (three) times daily as needed.   LORazepam (ATIVAN) 0.5 MG tablet Take 1 tablet (0.5 mg total) by mouth 2 (two) times daily as needed for anxiety.   methotrexate (RHEUMATREX) 2.5 MG tablet Take 1 tablet (2.5 mg total) by mouth every 12 (twelve) hours for 3 doses (3 tabs per week)   methotrexate 2.5 MG tablet Take 1 tablet (2.5 mg total) by mouth every 12 hours for 3 doses (3 tablets per week)   ondansetron (ZOFRAN) 4 MG tablet Take 1 tablet (4 mg total) by mouth every 6 (six) hours.   PULMICORT 0.5 MG/2ML nebulizer solution Inhale 2 mLs (0.5 mg total) into the lungs 2 (two) times daily.   revefenacin (YUPELRI) 175 MCG/3ML nebulizer solution Take 3 mLs (175 mcg total) by nebulization daily.   [DISCONTINUED] sertraline (ZOLOFT) 50 MG tablet Take 1 tablet (50 mg total) by mouth daily.   [DISCONTINUED] traMADol (ULTRAM) 50 MG tablet Take 2 tablets (100 mg total) by mouth every 8 (eight) hours as needed.   No facility-administered encounter medications on file as of 01/01/2022.    Thank you for the opportunity to participate in the care of [name].  The palliative care team will continue to follow. Please call our office at [phone] if we can be of additional assistance.   Hollace Kinnier,  DO  COVID-19 PATIENT SCREENING TOOL Asked and negative response unless otherwise noted:  Have you had symptoms of covid, tested positive or been in contact with someone with symptoms/positive test in the past 5-10 days? no

## 2022-01-02 ENCOUNTER — Other Ambulatory Visit (HOSPITAL_COMMUNITY): Payer: Self-pay

## 2022-01-02 MED ORDER — TRAMADOL HCL 50 MG PO TABS
100.0000 mg | ORAL_TABLET | Freq: Three times a day (TID) | ORAL | 0 refills | Status: DC | PRN
  Filled 2022-01-02: qty 180, 30d supply, fill #0

## 2022-01-02 NOTE — Telephone Encounter (Signed)
UDS 3/23 not appropriate. + for hydrocodone. I called and spoke with patient about increased risk of adverse effects when tramadol is combined with opioids. He states that he took medication left over from prior ED visit. PDMP appropriate. Will refill tramadol this month. Patient instructed to follow-up this month for repeat UDS. Of note he did receive hydrocodone from ED in April 23. -please help [name] in scheduling follow-up visit.

## 2022-01-03 ENCOUNTER — Other Ambulatory Visit: Payer: Medicaid Other

## 2022-01-03 DIAGNOSIS — Z515 Encounter for palliative care: Secondary | ICD-10-CM

## 2022-01-05 ENCOUNTER — Encounter: Payer: Self-pay | Admitting: Internal Medicine

## 2022-01-07 ENCOUNTER — Other Ambulatory Visit (HOSPITAL_COMMUNITY): Payer: Self-pay

## 2022-01-08 ENCOUNTER — Other Ambulatory Visit (HOSPITAL_COMMUNITY): Payer: Self-pay

## 2022-01-09 ENCOUNTER — Telehealth: Payer: Self-pay | Admitting: Pulmonary Disease

## 2022-01-09 ENCOUNTER — Other Ambulatory Visit (HOSPITAL_COMMUNITY): Payer: Self-pay

## 2022-01-09 MED ORDER — ALBUTEROL SULFATE HFA 108 (90 BASE) MCG/ACT IN AERS
2.0000 | INHALATION_SPRAY | Freq: Four times a day (QID) | RESPIRATORY_TRACT | 5 refills | Status: DC | PRN
Start: 2022-01-09 — End: 2022-10-02
  Filled 2022-01-09: qty 18, 25d supply, fill #0
  Filled 2022-02-26: qty 18, 25d supply, fill #1
  Filled 2022-04-15: qty 18, 25d supply, fill #2
  Filled 2022-05-13: qty 18, 25d supply, fill #3
  Filled 2022-07-01: qty 18, 25d supply, fill #4
  Filled 2022-08-21: qty 18, 25d supply, fill #5

## 2022-01-09 NOTE — Telephone Encounter (Signed)
Called patient and verified medication that he was wanting refilled. Verified pharmacy. Nothing further needed

## 2022-01-10 ENCOUNTER — Other Ambulatory Visit (HOSPITAL_COMMUNITY): Payer: Self-pay

## 2022-01-16 ENCOUNTER — Other Ambulatory Visit (HOSPITAL_COMMUNITY): Payer: Self-pay

## 2022-01-16 ENCOUNTER — Other Ambulatory Visit: Payer: Self-pay | Admitting: Pulmonary Disease

## 2022-01-16 MED ORDER — FORMOTEROL FUMARATE 20 MCG/2ML IN NEBU
2.0000 mL | INHALATION_SOLUTION | Freq: Two times a day (BID) | RESPIRATORY_TRACT | 5 refills | Status: DC
  Filled 2022-01-16: qty 120, 30d supply, fill #0
  Filled 2022-02-13: qty 120, 30d supply, fill #1
  Filled 2022-03-14: qty 120, 30d supply, fill #2
  Filled 2022-04-08: qty 120, 30d supply, fill #3
  Filled 2022-05-05: qty 120, 30d supply, fill #4
  Filled 2022-06-06: qty 120, 30d supply, fill #5

## 2022-01-17 ENCOUNTER — Other Ambulatory Visit (HOSPITAL_COMMUNITY): Payer: Self-pay

## 2022-01-17 DIAGNOSIS — J432 Centrilobular emphysema: Secondary | ICD-10-CM | POA: Diagnosis not present

## 2022-01-17 DIAGNOSIS — J9611 Chronic respiratory failure with hypoxia: Secondary | ICD-10-CM | POA: Diagnosis not present

## 2022-01-22 ENCOUNTER — Other Ambulatory Visit (HOSPITAL_COMMUNITY): Payer: Self-pay

## 2022-01-22 ENCOUNTER — Other Ambulatory Visit: Payer: Self-pay | Admitting: Pulmonary Disease

## 2022-01-22 MED ORDER — YUPELRI 175 MCG/3ML IN SOLN
175.0000 ug | Freq: Every day | RESPIRATORY_TRACT | 3 refills | Status: DC
  Filled 2022-01-22: qty 90, 30d supply, fill #0
  Filled 2022-02-19: qty 90, 30d supply, fill #1
  Filled 2022-03-24 – 2022-03-25 (×3): qty 90, 30d supply, fill #2
  Filled 2022-05-07 – 2022-06-06 (×2): qty 90, 30d supply, fill #3

## 2022-01-22 NOTE — Telephone Encounter (Deleted)
revefenacin (YUPELRI) 175 MCG/3ML nebulizer solution        Sig: Take 3 mLs (175 mcg total) by nebulization daily.   Disp:  90 mL    Refills:  3   Start: 01/22/2022 - 05/22/2022   Class: Normal   Non-formulary   To pharmacy: Dx: J44.9   Last ordered: 3 months ago (10/01/2021) by Coralyn Helling, MD   Last dispensed: 01/01/2022

## 2022-01-24 NOTE — Progress Notes (Signed)
COMMUNITY PALLIATIVE CARE SW NOTE  PATIENT NAME: [name] DOB: 26-Feb-1961 MRN: [id number]  PRIMARY CARE PROVIDER: Marrianne Mood, MD  RESPONSIBLE PARTY:  Acct ID - Guarantor Home Phone Work Phone Relationship Acct Type  [id number] KELVIS, BERGER* [phone]  Self P/F     [address], [place], [place] [address]   SOCIAL WORK TELEPHONIC VISIT  PC SW completed a telephonic visit with patient's mother-Alice, after she called in regarding patient. Alice advised that she is patient's mother and  was upset that she did not know what was going on with him. She states the he lives with her, but when medical people come over, he does not want her to sit in. SW educated Alice on [employer] and patient's rights. SW advised that as long as her son was cognitively able to make decisions for himself, then he can say who he would like to have information on his medical status. SW empathized with her and extended support. Alice verbalized no other concerns at this time.    [address] [place], [place]

## 2022-01-29 ENCOUNTER — Other Ambulatory Visit: Payer: Self-pay | Admitting: Internal Medicine

## 2022-01-29 DIAGNOSIS — K860 Alcohol-induced chronic pancreatitis: Secondary | ICD-10-CM

## 2022-01-30 ENCOUNTER — Other Ambulatory Visit (HOSPITAL_COMMUNITY): Payer: Self-pay

## 2022-01-30 MED ORDER — TRAMADOL HCL 50 MG PO TABS
100.0000 mg | ORAL_TABLET | Freq: Three times a day (TID) | ORAL | 0 refills | Status: DC | PRN
  Filled 2022-01-30 – 2022-02-04 (×3): qty 180, 30d supply, fill #0

## 2022-01-30 NOTE — Telephone Encounter (Signed)
Last [facility] 09/17/2021.  Next appointment 02/21/2022.

## 2022-01-31 ENCOUNTER — Other Ambulatory Visit (HOSPITAL_COMMUNITY): Payer: Self-pay

## 2022-02-01 ENCOUNTER — Other Ambulatory Visit (HOSPITAL_COMMUNITY): Payer: Self-pay

## 2022-02-04 ENCOUNTER — Other Ambulatory Visit (HOSPITAL_COMMUNITY): Payer: Self-pay

## 2022-02-06 ENCOUNTER — Other Ambulatory Visit (HOSPITAL_COMMUNITY): Payer: Self-pay

## 2022-02-13 ENCOUNTER — Other Ambulatory Visit (HOSPITAL_COMMUNITY): Payer: Self-pay

## 2022-02-18 ENCOUNTER — Other Ambulatory Visit (HOSPITAL_COMMUNITY): Payer: Self-pay

## 2022-02-18 ENCOUNTER — Encounter: Payer: Medicaid Other | Admitting: Student

## 2022-02-18 ENCOUNTER — Ambulatory Visit: Payer: Medicaid Other | Admitting: Pulmonary Disease

## 2022-02-18 ENCOUNTER — Encounter: Payer: Self-pay | Admitting: Pulmonary Disease

## 2022-02-18 VITALS — BP 100/68 | HR 98 | Ht 68.0 in | Wt 124.8 lb

## 2022-02-18 DIAGNOSIS — J449 Chronic obstructive pulmonary disease, unspecified: Secondary | ICD-10-CM

## 2022-02-18 DIAGNOSIS — J9611 Chronic respiratory failure with hypoxia: Secondary | ICD-10-CM | POA: Diagnosis not present

## 2022-02-18 DIAGNOSIS — Z7952 Long term (current) use of systemic steroids: Secondary | ICD-10-CM

## 2022-02-18 DIAGNOSIS — R64 Cachexia: Secondary | ICD-10-CM | POA: Diagnosis not present

## 2022-02-18 MED ORDER — CEFUROXIME AXETIL 500 MG PO TABS
500.0000 mg | ORAL_TABLET | Freq: Two times a day (BID) | ORAL | 0 refills | Status: DC
  Filled 2022-02-18: qty 14, 7d supply, fill #0

## 2022-02-18 MED ORDER — ALBUTEROL SULFATE (2.5 MG/3ML) 0.083% IN NEBU
3.0000 mL | INHALATION_SOLUTION | Freq: Four times a day (QID) | RESPIRATORY_TRACT | 6 refills | Status: DC | PRN
  Filled 2022-02-18: qty 75, 7d supply, fill #0
  Filled 2022-03-14: qty 75, 7d supply, fill #1
  Filled 2022-04-08: qty 75, 7d supply, fill #2
  Filled 2022-04-30: qty 75, 7d supply, fill #3
  Filled 2022-05-29: qty 75, 7d supply, fill #4
  Filled 2022-06-18 (×2): qty 75, 7d supply, fill #5
  Filled 2022-07-14: qty 75, 7d supply, fill #6

## 2022-02-18 MED ORDER — PREDNISONE 5 MG PO TABS
5.0000 mg | ORAL_TABLET | Freq: Every day | ORAL | 5 refills | Status: DC
  Filled 2022-02-18: qty 30, 30d supply, fill #0
  Filled 2022-03-07: qty 30, 30d supply, fill #1
  Filled 2022-04-02: qty 30, 30d supply, fill #2
  Filled 2022-05-05: qty 30, 30d supply, fill #3
  Filled 2022-05-30: qty 30, 30d supply, fill #4

## 2022-02-18 NOTE — Progress Notes (Addendum)
[facility]  Chief Complaint  Patient presents with   Follow-up    Follow-up    Constitutional:  BP 100/68 (BP Location: Right Arm)   Pulse 98   Ht 5\' 8"  (1.727 m)   Wt 124 lb 12.8 oz (56.6 kg)   SpO2 98% Comment: On 5L of oxygen  BMI 18.98 kg/m   Past Medical History:  Chronic pancreatitis, Depression, ETOH, HLD, Psoriasis, PUD, Rt PTX  Past Surgical History:  He  has a past surgical history that includes Laparoscopic cholecystectomy single site with intraoperative cholangiogram (N/A, 11/28/2016); Liver biopsy (N/A, 11/28/2016); Laparoscopic cholecystectomy (11/28/2016); Tympanostomy tube placement (Bilateral, ~ 1970); and Esophagogastroduodenoscopy (egd) with propofol (N/A, 12/04/2016).  Brief Summary:  [name] is a 61 y.o. male former smoker with COPD and bullous emphysema.      Subjective:   Feels like breathing is getting worse.  Now needing to use 4 to 5 liters oxygen 24/7.  Has cough with yellow sputum.  Gets winded and fatigued easily.  Having more trouble with sleep.  Not much appetite.  Drinks ensure.  Enrolled with palliative care and this has been helpful.  Has some ankle swelling.  Physical Exam:   Appearance - well kempt, thin wearing oxygen  ENMT - no sinus tenderness, no oral exudate, no LAN, Mallampati 3 airway, no stridor  Respiratory - decreased breath sounds bilaterally, no wheezing or rales  CV - s1s2 regular rate and rhythm, no murmurs  Ext - no clubbing, no edema  Skin - no rashes  Psych - normal mood and affect   Pulmonary testing:  PFT 07/14/17 >> FEV1 1.69 (48%), FEV1% 41, TLC 7.09 (107%), RV 3.41 (168%), DLCO 32% A1AT 05/27/18 >> 192, MM ONO with RA 06/09/18 >> test time 8 hrs 59 min.  Baseline SpO2 95%, low SpO2 90%  Chest Imaging:  CT angio chest 05/22/18 >> severe emphysema, moderate bullous area Rt upper lung, 2 mm nodule RLL, 7 mm nodule in lingula, 21 mm nodule LLL CT chest 09/09/18  >> bullous emphysema, mod/severe centrilobular emphysema, diffuse bronchial wall thickening, resolved nodule LLL, new 6 mm solid nodule LUL, new bandlike consolidation RUP CT chest 03/10/19 >> LUL nodule resolved, stable 3 mm nodule RUL  Cardiac Tests:  Echo 10/11/16 >> EF 69%, grade 1 DD  Social History:  He  reports that he quit smoking about 3 years ago. His smoking use included cigarettes. He has a 4.00 pack-year smoking history. He has never used smokeless tobacco. He reports that he does not currently use alcohol. He reports that he does not currently use drugs after having used the following drugs: Marijuana.  Family History:  His family history includes Cancer in his father; Diabetes in his mother; Heart disease in his father.     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 - wasn't able to meet criteria for lung transplant due to financial constraints and requirement to live closer to transplant center - seen by thoracic surgery and not candidate for lung volume reduction - he has difficulty using inhaler devices due to the severity of his COPD and requires the use of a nebulizer device - continue yupelri, perforomist, and pulmicort - prn albuterol - has progressive symptoms and not has an exacerbation - will give him course of cefuroxime - will give him prednisone taper, but plan to keep him on 5 mg prednisone daily  Chronic respiratory failure with hypoxia. -  goal SpO2 > 90% - gets supplies from [employer] - 4 to 5 liters 24/7   Psoriasis. 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 - discussed options to maintain caloric and protein intake; this is limited to some degree because of his chronic pancreatitis  Goals of care. - Enrolled with [employer], contracting Palliative Care services - DNR/DNI  Time Spent Involved in Patient Care on Day of Examination:  36 minutes  Follow up:   Patient  Instructions  Cefuroxime antibiotic twice per day for 7 days  Prednisone 5 mg pill >> 4 pills daily for 2 days, 3 pills daily for 2 days, 2 pills daily for 2 days, then 1 pill daily until next visit  Follow up in 3 months  Medication List:   Allergies as of 02/18/2022       Reactions   Nsaids Other (See Comments)   Bleeding gastritis        Medication List        Accurate as of February 18, 2022  2:12 PM. If you have any questions, ask your nurse or doctor.          amitriptyline 50 MG tablet Commonly known as: ELAVIL Take 1 tablet (50 mg total) by mouth at bedtime.   cefUROXime 500 MG tablet Commonly known as: CEFTIN Take 1 tablet (500 mg total) by mouth 2 (two) times daily with a meal. Started by: Coralyn Helling, MD   DULoxetine 30 MG capsule Commonly known as: Cymbalta Take 1 capsule (30 mg total) by mouth 2 (two) times daily.   formoterol 20 MCG/2ML nebulizer solution Commonly known as: PERFOROMIST Take 2 mLs (20 mcg total) by nebulization 2 (two) times daily.   hydrOXYzine 25 MG tablet Commonly known as: ATARAX Take 1 tablet (25 mg total) by mouth 3 (three) times daily as needed.   LORazepam 0.5 MG tablet Commonly known as: ATIVAN Take 1 tablet (0.5 mg total) by mouth 2 (two) times daily as needed for anxiety.   methotrexate 2.5 MG tablet Take 1 tablet (2.5 mg total) by mouth every 12 hours for 3 doses (3 tablets per week)   methotrexate 2.5 MG tablet Take 1 tablet (2.5 mg total) by mouth every 12 (twelve) hours for 3 doses (3 tabs per week)   ondansetron 4 MG tablet Commonly known as: ZOFRAN Take 1 tablet (4 mg total) by mouth every 6 (six) hours.   predniSONE 5 MG tablet Commonly known as: DELTASONE Take 1 tablet (5 mg total) by mouth daily with breakfast. Started by: Coralyn Helling, MD   Pulmicort 0.5 MG/2ML nebulizer solution Generic drug: budesonide Inhale 2 mLs (0.5 mg total) into the lungs 2 (two) times daily.   traMADol 50 MG  tablet Commonly known as: ULTRAM Take 2 tablets (100 mg total) by mouth every 8 (eight) hours as needed.   Ventolin HFA 108 (90 Base) MCG/ACT inhaler Generic drug: albuterol Inhale 2 puffs into the lungs every 6 (six) hours as needed for wheezing or shortness of breath.   albuterol (2.5 MG/3ML) 0.083% nebulizer solution Commonly known as: PROVENTIL TAKE 3 MLS (2.5 MG TOTAL) BY NEBULIZATION EVERY 6 (SIX) HOURS AS NEEDED FOR WHEEZING OR SHORTNESS OF BREATH.   Yupelri 175 MCG/3ML nebulizer solution Generic drug: revefenacin Take 3 mLs (175 mcg total) by nebulization daily.        Signature:  Coralyn Helling, MD [facility] Pulmonary/Critical Care Pager - [phone] 02/18/2022, 2:12 PM

## 2022-02-18 NOTE — Patient Instructions (Signed)
Cefuroxime antibiotic twice per day for 7 days  Prednisone 5 mg pill >> 4 pills daily for 2 days, 3 pills daily for 2 days, 2 pills daily for 2 days, then 1 pill daily until next visit  Follow up in 3 months

## 2022-02-19 ENCOUNTER — Other Ambulatory Visit (HOSPITAL_COMMUNITY): Payer: Self-pay

## 2022-02-20 ENCOUNTER — Other Ambulatory Visit (HOSPITAL_COMMUNITY): Payer: Self-pay

## 2022-02-20 ENCOUNTER — Ambulatory Visit (INDEPENDENT_AMBULATORY_CARE_PROVIDER_SITE_OTHER): Payer: Medicaid Other | Admitting: Student

## 2022-02-20 ENCOUNTER — Other Ambulatory Visit: Payer: Self-pay | Admitting: Internal Medicine

## 2022-02-20 VITALS — BP 118/69 | HR 100 | Temp 97.5°F | Wt 125.6 lb

## 2022-02-20 DIAGNOSIS — K860 Alcohol-induced chronic pancreatitis: Secondary | ICD-10-CM

## 2022-02-20 DIAGNOSIS — F10929 Alcohol use, unspecified with intoxication, unspecified: Secondary | ICD-10-CM | POA: Diagnosis not present

## 2022-02-20 DIAGNOSIS — J430 Unilateral pulmonary emphysema [MacLeod's syndrome]: Secondary | ICD-10-CM | POA: Diagnosis not present

## 2022-02-20 DIAGNOSIS — F329 Major depressive disorder, single episode, unspecified: Secondary | ICD-10-CM

## 2022-02-20 DIAGNOSIS — Z87891 Personal history of nicotine dependence: Secondary | ICD-10-CM | POA: Diagnosis not present

## 2022-02-20 DIAGNOSIS — L409 Psoriasis, unspecified: Secondary | ICD-10-CM | POA: Diagnosis not present

## 2022-02-20 NOTE — Assessment & Plan Note (Signed)
Patient appears to be in good spirits today.  However I am concerned this belies pretty severe anxiety and depression.  He says that during the day he can distract himself by watching television.  At night he is often up quite late with racing thoughts.  He lives with his mother, in her 58s, with whom he does not share details about the severity of his condition.  Not interested in talk therapy at this time.  He prefers to keep his feelings to himself.  Currently on a regimen of 60 mg of duloxetine in divided doses.  Also on amitriptyline as an adjunct for pain therapy.  Recently was started on lorazepam by palliative care for anxiety.  Not interested in making any changes to his medication regimen at this point.

## 2022-02-20 NOTE — Assessment & Plan Note (Signed)
On a chronic pain regimen of tramadol and amitriptyline.  Drinks Ensure for nutrition.  Does not eat solid food due to pain.  Weight is stable.  No changes to medical management for now.

## 2022-02-20 NOTE — Assessment & Plan Note (Signed)
Has not seen his rheumatologist in quite some time.  Stopped taking methotrexate around a year ago.  Notes that his psoriasis is starting to return.  He has a scaly lesion over the first MCP joint of his left hand that is most notable.  For now he is satisfied with managing his psoriasis with over-the-counter salves and creams.  I instructed him to make follow-up appointment if it becomes an issue for him again.

## 2022-02-20 NOTE — Progress Notes (Signed)
Subjective:  CC: Routine follow up.  HPI:  [name] is a 61 y.o. male with a past medical history stated below and presents today for routine follow-up. Please see problem based assessment and plan for additional details.  Doing "as well as can be expected" per patient report.  Currently being closely followed by pulmonology and palliative care for severe COPD.  Spends his days at home where he lives with his mother, watching TV.  Has trouble sleeping at night due to racing thoughts.  Does not bring any acute complaints to the clinic today.  Past Medical History:  Diagnosis Date   Chronic pancreatitis (HCC)    Depression    Duodenitis    by EGD 1/07   Emphysema    unclear diagnosis noted in previous EMR   History of alcohol abuse    Hyperlipidemia    Psoriasis    extensive, responds to strong steroids, unable to start molecular therapies due to cost   RUE weakness    2/2 cervical spondylosis   Stomach ulcer     Current Outpatient Medications on File Prior to Visit  Medication Sig Dispense Refill   albuterol (PROVENTIL) (2.5 MG/3ML) 0.083% nebulizer solution Take 3 mLs by nebulization every 6 (six) hours as needed for wheezing or shortness of breath. 75 mL 6   albuterol (VENTOLIN HFA) 108 (90 Base) MCG/ACT inhaler Inhale 2 puffs into the lungs every 6 (six) hours as needed for wheezing or shortness of breath. 18 g 5   amitriptyline (ELAVIL) 50 MG tablet Take 1 tablet (50 mg total) by mouth at bedtime. 30 tablet 5   cefUROXime (CEFTIN) 500 MG tablet Take 1 tablet (500 mg total) by mouth 2 (two) times daily with a meal. 14 tablet 0   DULoxetine (CYMBALTA) 30 MG capsule Take 1 capsule (30 mg total) by mouth 2 (two) times daily. 180 capsule 3   formoterol (PERFOROMIST) 20 MCG/2ML nebulizer solution Take 2 mLs (20 mcg total) by nebulization 2 (two) times daily. 120 mL 5   hydrOXYzine (ATARAX) 25 MG tablet Take 1 tablet (25 mg total) by mouth 3 (three) times daily as  needed. 100 tablet 2   LORazepam (ATIVAN) 0.5 MG tablet Take 1 tablet (0.5 mg total) by mouth 2 (two) times daily as needed for anxiety. 60 tablet 5   ondansetron (ZOFRAN) 4 MG tablet Take 1 tablet (4 mg total) by mouth every 6 (six) hours. 12 tablet 0   predniSONE (DELTASONE) 5 MG tablet Take 1 tablet (5 mg total) by mouth daily with breakfast. 30 tablet 5   PULMICORT 0.5 MG/2ML nebulizer solution Inhale 2 mLs (0.5 mg total) into the lungs 2 (two) times daily. 120 mL 11   revefenacin (YUPELRI) 175 MCG/3ML nebulizer solution Take 3 mLs (175 mcg total) by nebulization daily. 90 mL 3   traMADol (ULTRAM) 50 MG tablet Take 2 tablets (100 mg total) by mouth every 8 (eight) hours as needed. 180 tablet 0   [DISCONTINUED] sertraline (ZOLOFT) 50 MG tablet Take 1 tablet (50 mg total) by mouth daily. 90 tablet 2   No current facility-administered medications on file prior to visit.    Family History  Problem Relation Age of Onset   Diabetes Mother    Heart disease Father    Cancer Father        ?kidney or liver cancer    Social History   Socioeconomic History   Marital status: Divorced    Spouse name: Not  on file   Number of children: Not on file   Years of education: Not on file   Highest education level: Not on file  Occupational History   Not on file  Tobacco Use   Smoking status: Former    Packs/day: 0.10    Years: 40.00    Total pack years: 4.00    Types: Cigarettes    Quit date: 05/19/2018    Years since quitting: 3.7   Smokeless tobacco: Never   Tobacco comments:    4-5 cigs per day   Vaping Use   Vaping Use: Never used  Substance and Sexual Activity   Alcohol use: Not Currently    Comment: quit x 10 yrs.   Drug use: Not Currently    Types: Marijuana    Comment: quit in high school   Sexual activity: Not on file    Comment: trying for disability to gain medicare coverage, has pain contract and warned that if tested positive again for cocaine will not be given narcotics   Other Topics Concern   Not on file  Social History Narrative   Financial assistance approved for 100% discount at [facility] and has [facility] card   [name]  March 08, 2008 2:17 PM   Social Determinants of Health   Financial Resource Strain: Not on file  Food Insecurity: Not on file  Transportation Needs: Not on file  Physical Activity: Not on file  Stress: Not on file  Social Connections: Not on file  Intimate Partner Violence: Not on file    Review of Systems: ROS negative except for what is noted on the assessment and plan.  Objective:   Vitals:   02/20/22 1334  BP: 118/69  Pulse: 100  Temp: (!) 97.5 F (36.4 C)  TempSrc: Oral  SpO2: 98%  Weight: 125 lb 9.6 oz (57 kg)    Physical Exam: Constitutional: Frail-appearing male sitting in wheelchair. Cardiovascular: Regular rate and rhythm. Pulmonary/Chest: Lung sounds decreased throughout but most notably on the right side.  Scant wheezing throughout.  Some increased work of breathing noted. Neurological: Alert and oriented. Skin: Pale. Psych: Pleasant, appropriate mood and affect.   Assessment & Plan:  COPD with emphysema (HCC) Severe COPD.  On 6 L/min 24/7 now.  Followed by pulmonology and palliative care.  Recently treated in the outpatient setting for COPD exacerbation with cefuroxime and prednisone taper.  Will taper down to a 5 mg daily prednisone dose.  Quality life is currently poor.  Would like to investigate the possibility of getting a portable oxygen concentrator to improve his mobility. - Continue formoterol - Continue revefenacin - Continue Pulmicort - Continue albuterol - Finish 7-day course of cefuroxime and prednisone taper per pulmonology.  Chronic pancreatitis due to acute alcohol intoxication (HCC) On a chronic pain regimen of tramadol and amitriptyline.  Drinks Ensure for nutrition.  Does not eat solid food due to pain.  Weight is stable.  No changes to medical management for  now.  Psoriasis Has not seen his rheumatologist in quite some time.  Stopped taking methotrexate around a year ago.  Notes that his psoriasis is starting to return.  He has a scaly lesion over the first MCP joint of his left hand that is most notable.  For now he is satisfied with managing his psoriasis with over-the-counter salves and creams.  I instructed him to make follow-up appointment if it becomes an issue for him again.  Depression Patient appears to be in good spirits today.  However I am concerned this belies pretty severe anxiety and depression.  He says that during the day he can distract himself by watching television.  At night he is often up quite late with racing thoughts.  He lives with his mother, in her 69s, with whom he does not share details about the severity of his condition.  Not interested in talk therapy at this time.  He prefers to keep his feelings to himself.  Currently on a regimen of 60 mg of duloxetine in divided doses.  Also on amitriptyline as an adjunct for pain therapy.  Recently was started on lorazepam by palliative care for anxiety.  Not interested in making any changes to his medication regimen at this point.    Patient seen with Dr. Lawernce Keas, M.D. [facility] Internal Medicine  PGY-1 Pager: [phone] Date 02/20/2022  Time 3:03 PM

## 2022-02-20 NOTE — Assessment & Plan Note (Signed)
Severe COPD.  On 6 L/min 24/7 now.  Followed by pulmonology and palliative care.  Recently treated in the outpatient setting for COPD exacerbation with cefuroxime and prednisone taper.  Will taper down to a 5 mg daily prednisone dose.  Quality life is currently poor.  Would like to investigate the possibility of getting a portable oxygen concentrator to improve his mobility. - Continue formoterol - Continue revefenacin - Continue Pulmicort - Continue albuterol - Finish 7-day course of cefuroxime and prednisone taper per pulmonology.

## 2022-02-20 NOTE — Patient Instructions (Signed)
Thank you, [name] for allowing [place] to provide your care today. Today we discussed COPD, chronic pain due to pancreatitis, depression, anxiety.  For your COPD I will see about getting you a portable oxygen concentrator to help improve your quality of life.  At this time I do not recommend any medication changes.  Please continue taking all of your medicines as prescribed.  Follow up: 4-6 months or sooner as needed.  We look forward to seeing you next time. Please call our clinic at [phone] if you have any questions or concerns. The best time to call is Monday-Friday from 9am-4pm, but there is someone available 24/7. If after hours or the weekend, call the main hospital number and ask for the Internal Medicine Resident On-Call. If you need medication refills, please notify your pharmacy one week in advance and they will send [place] a request.   Thank you for trusting me with your care. Wishing you the best!   Marrianne Mood, MD [facility] Center

## 2022-02-21 ENCOUNTER — Telehealth: Payer: Self-pay

## 2022-02-21 ENCOUNTER — Other Ambulatory Visit (HOSPITAL_COMMUNITY): Payer: Self-pay

## 2022-02-21 ENCOUNTER — Other Ambulatory Visit: Payer: Medicaid Other | Admitting: Internal Medicine

## 2022-02-21 ENCOUNTER — Encounter: Payer: Self-pay | Admitting: Internal Medicine

## 2022-02-21 VITALS — BP 96/52 | HR 72 | Temp 97.7°F | Resp 14 | Wt 124.0 lb

## 2022-02-21 DIAGNOSIS — Z515 Encounter for palliative care: Secondary | ICD-10-CM | POA: Diagnosis not present

## 2022-02-21 DIAGNOSIS — F419 Anxiety disorder, unspecified: Secondary | ICD-10-CM | POA: Diagnosis not present

## 2022-02-21 DIAGNOSIS — E43 Unspecified severe protein-calorie malnutrition: Secondary | ICD-10-CM

## 2022-02-21 DIAGNOSIS — F329 Major depressive disorder, single episode, unspecified: Secondary | ICD-10-CM | POA: Diagnosis not present

## 2022-02-21 DIAGNOSIS — J9611 Chronic respiratory failure with hypoxia: Secondary | ICD-10-CM

## 2022-02-21 DIAGNOSIS — J439 Emphysema, unspecified: Secondary | ICD-10-CM

## 2022-02-21 MED ORDER — OMEPRAZOLE 40 MG PO CPDR
40.0000 mg | DELAYED_RELEASE_CAPSULE | Freq: Every day | ORAL | 3 refills | Status: DC
  Filled 2022-02-21: qty 30, 30d supply, fill #0
  Filled 2022-03-24: qty 30, 30d supply, fill #1
  Filled 2022-04-30: qty 30, 30d supply, fill #2
  Filled 2022-05-30: qty 30, 30d supply, fill #3

## 2022-02-21 NOTE — Progress Notes (Signed)
Internal Medicine Clinic Attending  I saw and evaluated the patient.  I personally confirmed the key portions of the history and exam documented by Dr. McLendon and I reviewed pertinent patient test results.  The assessment, diagnosis, and plan were formulated together and I agree with the documentation in the resident's note.  

## 2022-02-21 NOTE — Progress Notes (Signed)
[facility] Follow-Up Visit Telephone: [phone]  Fax: [phone]   Date of encounter: 02/21/22 12:20 PM PATIENT NAME: [name] [address] [place] [id number]   [phone] (home)  DOB: 1961-02-01 MRN: [id number] PRIMARY CARE PROVIDER:    Nani Gasser, MD,  [place] [address] [phone]  REFERRING PROVIDER:   Nani Gasser, MD [address] [place],  [place] [address] [phone]  RESPONSIBLE PARTY:    Contact Information     Name Relation Home Work [place] Mother  [phone]        I met face to face with patient and family in his home. Palliative Care was asked to follow this patient by consultation request of  Nani Gasser, MD to address advance care planning and complex medical decision making. This is follow-up visit.                                     ASSESSMENT AND PLAN / RECOMMENDATIONS:   Advance Care Planning/Goals of Care: Goals include to maximize quality of life and symptom management. Patient/health care surrogate gave his/her permission to discuss.Our advance care planning conversation included a discussion about:    The value and importance of advance care planning  Experiences with loved ones who have been seriously ill or have died  Exploration of personal, cultural or spiritual beliefs that might influence medical decisions  Exploration of goals of care in the event of a sudden injury or illness  Identification  of a healthcare agent--mother but she has cognitive losses Review and updating or creation of an  advance directive document . Decision not to resuscitate or to de-escalate disease focused treatments due to poor prognosis. CODE STATUS:  DNR, MOST completed last visit and on file in vynca/epic  Symptom Management/Plan: 1. Chronic respiratory failure with hypoxia (HCC) -now on 4-5 L O2 to keep sats over 90%, using  concentrator, awaits further info from PCP office about getting portable O2 to avoid carrying tanks around given his frailty  2. Giant bullous emphysema (HCC) -continues to progress, amid flare; is to be taking taper but instructions not on bottle only AVS and pt was confused--clarified with pulmonary to do taper, then 23m daily -also added omeprazole back to regimen and [facility] as pt has h/o GI bleed from ulcer due to his alcohol history -also recommended he take his prednisone with food -continue O2 and regimen per pulmonary, also completing course of abx  3. Anxiety -worse at night when he has time to think, continue lorazepam 0.533mpo bid prn which has helped considerably  4. Reactive depression -continues on cymbalta though he reports little benefit  5. Severe protein-calorie malnutrition ([facility]-continue ensure plus supplements which is his only caloric intake at this point as everything else causes pancreatitis  6. Palliative care by specialist -will try to distribute visits a little so he doesn't see me, his PCP and pulmonary in the same week next time -04/25/2022 to f/u at home     Follow up Palliative Care Visit: Palliative care will continue to follow for complex medical decision making, advance care planning, and clarification of goals. Return 04/25/2022  weeks or prn.   This visit was coded based on medical decision making (MDM).  PPS: 50%  HOSPICE ELIGIBILITY/DIAGNOSIS: TBD  Chief Complaint: Follow-up palliative visit  HISTORY OF  PRESENT ILLNESS:  PLUMER MITTELSTAEDT is a 61 y.o. year old male  with COPD, respiratory failure w/ hypoxia, h/o alcoholism and GI bleed, chronic pancreatitis seen for in-home palliative f/u .   He has nothing new to report outside of copd exacerbation treated by Dr. Marguerite Olea cephalosporin and to be on taper though bottle said daily so he's only taken one so far--clarified with Dr. Halford Chessman and he will do taper and then go back  to 74m daily thereafter WITH ensure.    Now to be on steroids long-term.  Discussed resuming PPI due to this and h/o GI bleeding.    Intake of ensure same.  No food.    Anxious at night, but days ok when he watches tv  Asks again for help applying for full disability--we'll see if social work can assist or direct hm properly.  History obtained from review of EMR, discussion with primary team, and interview with family, facility staff/caregiver and/or Mr. BReagle  I reviewed available labs, medications, imaging, studies and related documents from the EMR.  Records reviewed and summarized above.   ROS Review of Systems  Constitutional:  Positive for activity change and fatigue. Negative for appetite change, chills, fever and unexpected weight change.  HENT:  Positive for congestion. Negative for trouble swallowing.   Eyes:  Negative for visual disturbance.  Respiratory:  Positive for cough and shortness of breath. Negative for chest tightness.   Cardiovascular:  Negative for chest pain, palpitations and leg swelling.  Gastrointestinal:  Negative for abdominal pain and constipation.  Genitourinary:  Negative for dysuria and urgency.  Musculoskeletal:  Negative for gait problem.  Skin:  Positive for color change.  Neurological:  Negative for dizziness.  Psychiatric/Behavioral:  Positive for sleep disturbance. Negative for confusion. The patient is nervous/anxious.     Physical Exam: There were no vitals filed for this visit. There is no height or weight on file to calculate BMI. Wt Readings from Last 500 Encounters:  02/20/22 125 lb 9.6 oz (57 kg)  02/18/22 124 lb 12.8 oz (56.6 kg)  10/26/21 123 lb 6.4 oz (56 kg)  10/12/21 123 lb 0.3 oz (55.8 kg)  10/05/21 123 lb (55.8 kg)  09/28/21 123 lb 9.6 oz (56.1 kg)  09/17/21 124 lb 12.8 oz (56.6 kg)  08/31/21 127 lb 9.6 oz (57.9 kg)  07/20/21 126 lb 1.6 oz (57.2 kg)  05/07/21 122 lb 8 oz (55.6 kg)  01/18/21 122 lb 6.4 oz (55.5 kg)   07/17/20 119 lb 9.6 oz (54.3 kg)  06/28/20 121 lb 14.4 oz (55.3 kg)  05/15/20 121 lb 12.8 oz (55.2 kg)  03/27/20 124 lb 6.4 oz (56.4 kg)  11/09/19 127 lb 11.2 oz (57.9 kg)  07/16/19 130 lb (59 kg)  07/06/19 130 lb 9.6 oz (59.2 kg)  03/26/19 132 lb 9.6 oz (60.1 kg)  03/18/19 132 lb 3.2 oz (60 kg)  06/29/18 112 lb 3.2 oz (50.9 kg)  06/09/18 106 lb 8 oz (48.3 kg)  05/27/18 110 lb (49.9 kg)  05/25/18 110 lb 11.2 oz (50.2 kg)  05/22/18 120 lb (54.4 kg)  02/03/18 106 lb 14.4 oz (48.5 kg)  01/09/18 109 lb (49.4 kg)  12/02/17 111 lb 3.2 oz (50.4 kg)  10/28/17 113 lb 4.8 oz (51.4 kg)  08/26/17 116 lb (52.6 kg)  08/01/17 114 lb 8 oz (51.9 kg)  07/07/17 116 lb (52.6 kg)  06/19/17 121 lb (54.9 kg)  06/06/17 121 lb 6.4 oz (55.1 kg)  05/24/17 119 lb 11.2  oz (54.3 kg)  01/12/17 128 lb (58.1 kg)  12/04/16 132 lb (59.9 kg)  11/28/16 140 lb (63.5 kg)  11/26/16 140 lb (63.5 kg)  11/06/16 138 lb (62.6 kg)  10/14/16 138 lb 6.4 oz (62.8 kg)  09/02/16 130 lb (59 kg)  11/04/12 130 lb (59 kg)  02/02/12 127 lb (57.6 kg)  04/08/11 135 lb 9.6 oz (61.5 kg)  03/14/11 135 lb 12.8 oz (61.6 kg)  12/06/10 131 lb 1.6 oz (59.5 kg)  11/15/10 133 lb (60.3 kg)  09/27/10 126 lb 14.4 oz (57.6 kg)  07/27/10 124 lb 11.2 oz (56.6 kg)   Physical Exam Constitutional:      Appearance: He is ill-appearing.     Comments: Cachectic, ashy toned skin  Cardiovascular:     Rate and Rhythm: Normal rate and regular rhythm.     Pulses: Normal pulses.     Heart sounds: Normal heart sounds.  Pulmonary:     Breath sounds: Wheezing and rhonchi present.     Comments: Increased effort Abdominal:     General: Bowel sounds are normal.     Tenderness: There is no guarding or rebound.  Musculoskeletal:        General: Normal range of motion.     Right lower leg: Edema present.     Left lower leg: Edema present.     Comments: Nonpitting in ankles, wearing steel-tipped boots  Skin:    Comments: Ashy skin tone   Neurological:     General: No focal deficit present.     Mental Status: He is alert and oriented to person, place, and time.  Psychiatric:        Mood and Affect: Mood normal.     CURRENT PROBLEM LIST:  Patient Active Problem List   Diagnosis Date Noted   Chronic respiratory failure with hypoxia ([place]) 09/28/2021   COPD exacerbation ([place]) 09/17/2021   Giant bullous emphysema ([place]) 05/15/2020   Dyspnea on exertion 05/15/2020   Healthcare maintenance 05/15/2020   Goals of care, counseling/discussion 11/09/2019   Depression    Preventative health care 06/10/2018   Lung nodule 05/26/2018   COPD with emphysema ([place])    History of pneumothorax 05/19/2017   Chronic cholecystitis with calculus s/p lap cholecystectomy 11/28/2016 11/28/2016   Psoriasis    Chronic pancreatitis due to acute alcohol intoxication ([place]) 04/13/2008   Dyslipidemia 08/13/2007    PAST MEDICAL HISTORY:  Active Ambulatory Problems    Diagnosis Date Noted   Dyslipidemia 08/13/2007   Chronic pancreatitis due to acute alcohol intoxication ([place]) 04/13/2008   Chronic cholecystitis with calculus s/p lap cholecystectomy 11/28/2016 11/28/2016   Psoriasis    History of pneumothorax 05/19/2017   COPD with emphysema ([place])    Lung nodule 05/26/2018   Preventative health care 06/10/2018   Depression    Goals of care, counseling/discussion 11/09/2019   Giant bullous emphysema ([place]) 05/15/2020   Dyspnea on exertion 05/15/2020   Healthcare maintenance 05/15/2020   COPD exacerbation ([place]) 09/17/2021   Chronic respiratory failure with hypoxia ([facility]) 09/28/2021   Resolved Ambulatory Problems    Diagnosis Date Noted   HYPERKALEMIA 02/20/2010   Anxiety state, unspecified 08/29/2008   ALCOHOL ABUSE 04/23/2007   EROSIVE GASTRITIS 02/24/2008   BLOOD IN STOOL, OCCULT 01/18/2008   PANCREATITIS, HX OF 04/23/2007   MELENA, HX OF 19/37/9024   Alcoholic fatty liver 09/73/5329   Duodenitis    Acute on chronic respiratory failure  with hypoxia (HCC)    Pneumothorax, right  S/P chest tube placement (HCC)    Acute respiratory failure with hypoxia (HCC)    Tobacco abuse    Acute bronchitis 05/26/2018   Past Medical History:  Diagnosis Date   Chronic pancreatitis ([facility])    Duodenitis    Emphysema    History of alcohol abuse    Hyperlipidemia    RUE weakness    Stomach ulcer     SOCIAL HX:  Social History   Tobacco Use   Smoking status: Former    Packs/day: 0.10    Years: 40.00    Total pack years: 4.00    Types: Cigarettes    Quit date: 05/19/2018    Years since quitting: 3.7   Smokeless tobacco: Never   Tobacco comments:    4-5 cigs per day   Substance Use Topics   Alcohol use: Not Currently    Comment: quit x 10 yrs.     ALLERGIES:  Allergies  Allergen Reactions   Nsaids Other (See Comments)    Bleeding gastritis      PERTINENT MEDICATIONS:  Outpatient Encounter Medications as of 02/21/2022  Medication Sig   albuterol (PROVENTIL) (2.5 MG/3ML) 0.083% nebulizer solution Take 3 mLs by nebulization every 6 (six) hours as needed for wheezing or shortness of breath.   albuterol (VENTOLIN HFA) 108 (90 Base) MCG/ACT inhaler Inhale 2 puffs into the lungs every 6 (six) hours as needed for wheezing or shortness of breath.   amitriptyline (ELAVIL) 50 MG tablet Take 1 tablet (50 mg total) by mouth at bedtime.   cefUROXime (CEFTIN) 500 MG tablet Take 1 tablet (500 mg total) by mouth 2 (two) times daily with a meal.   DULoxetine (CYMBALTA) 30 MG capsule Take 1 capsule (30 mg total) by mouth 2 (two) times daily.   formoterol (PERFOROMIST) 20 MCG/2ML nebulizer solution Take 2 mLs (20 mcg total) by nebulization 2 (two) times daily.   hydrOXYzine (ATARAX) 25 MG tablet Take 1 tablet (25 mg total) by mouth 3 (three) times daily as needed.   LORazepam (ATIVAN) 0.5 MG tablet Take 1 tablet (0.5 mg total) by mouth 2 (two) times daily as needed for anxiety.   ondansetron (ZOFRAN) 4 MG tablet Take 1 tablet (4 mg  total) by mouth every 6 (six) hours.   predniSONE (DELTASONE) 5 MG tablet Take 1 tablet (5 mg total) by mouth daily with breakfast.   PULMICORT 0.5 MG/2ML nebulizer solution Inhale 2 mLs (0.5 mg total) into the lungs 2 (two) times daily.   revefenacin (YUPELRI) 175 MCG/3ML nebulizer solution Take 3 mLs (175 mcg total) by nebulization daily.   traMADol (ULTRAM) 50 MG tablet Take 2 tablets (100 mg total) by mouth every 8 (eight) hours as needed.   [DISCONTINUED] sertraline (ZOLOFT) 50 MG tablet Take 1 tablet (50 mg total) by mouth daily.   No facility-administered encounter medications on file as of 02/21/2022.    Thank you for the opportunity to participate in the care of Mr. Duval.  The palliative care team will continue to follow. Please call our office at [phone] if we can be of additional assistance.   Hollace Kinnier, DO  COVID-19 PATIENT SCREENING TOOL Asked and negative response unless otherwise noted:  Have you had symptoms of covid, tested positive or been in contact with someone with symptoms/positive test in the past 5-10 days? No

## 2022-02-21 NOTE — Telephone Encounter (Signed)
(  4:14 pm) Per request of Dr. Renato Gails, SW completed a follow-up call to patient to provide resources and education to him regarding disability. Patient is interested in applying for disability benefits. SW referred him to the  [employer] and will mail him an application. SW also encouraged him to reach out to a disability specialist or attorney with specific questions regarding the benefit. SW also discussed PCS services with patient, in which he declined, stating he was not ready for those services at this time.  SW reinforced her contact information and provided reassurance of support to him and encouraged him to call back with any additional questions or concerns.

## 2022-02-22 ENCOUNTER — Other Ambulatory Visit (HOSPITAL_COMMUNITY): Payer: Self-pay

## 2022-02-22 MED ORDER — TRAMADOL HCL 50 MG PO TABS
100.0000 mg | ORAL_TABLET | Freq: Three times a day (TID) | ORAL | 0 refills | Status: DC | PRN
  Filled 2022-02-22 – 2022-03-06 (×3): qty 180, 30d supply, fill #0

## 2022-02-22 NOTE — Telephone Encounter (Signed)
PDMP reviewed an appropriate

## 2022-02-26 ENCOUNTER — Other Ambulatory Visit (HOSPITAL_COMMUNITY): Payer: Self-pay

## 2022-02-28 ENCOUNTER — Other Ambulatory Visit (HOSPITAL_COMMUNITY): Payer: Self-pay

## 2022-03-04 ENCOUNTER — Other Ambulatory Visit: Payer: Self-pay

## 2022-03-04 ENCOUNTER — Other Ambulatory Visit (HOSPITAL_COMMUNITY): Payer: Self-pay

## 2022-03-04 ENCOUNTER — Telehealth: Payer: Self-pay

## 2022-03-04 DIAGNOSIS — J430 Unilateral pulmonary emphysema [MacLeod's syndrome]: Secondary | ICD-10-CM

## 2022-03-04 DIAGNOSIS — K860 Alcohol-induced chronic pancreatitis: Secondary | ICD-10-CM

## 2022-03-04 NOTE — Telephone Encounter (Signed)
Rx refilled 02/22/22

## 2022-03-04 NOTE — Telephone Encounter (Signed)
Pt is requesting a call back .Marland Kitchen He stated that at his last OV his PCP was to be looking in to getting him a portable  O2 tank  but he has not heard anything back.Marland Kitchen

## 2022-03-06 ENCOUNTER — Other Ambulatory Visit: Payer: Self-pay | Admitting: *Deleted

## 2022-03-06 ENCOUNTER — Other Ambulatory Visit (HOSPITAL_COMMUNITY): Payer: Self-pay

## 2022-03-06 ENCOUNTER — Telehealth: Payer: Self-pay | Admitting: Pulmonary Disease

## 2022-03-06 DIAGNOSIS — K860 Alcohol-induced chronic pancreatitis: Secondary | ICD-10-CM

## 2022-03-06 NOTE — Telephone Encounter (Signed)
Called and spoke with [name].  [name] stated [facility] received a DME order for POC 6 L [place] from Dr. Erlinda Hong 03/05/22.  [name] stated POC does not up to 6 L [place] and she had called patient to let him know.  [name] stated patient had to her he was on 6L [place].Patient requested [name] follow up with Dr. Craige Cotta.  Per [facility] O2 orders and Dr. Evlyn Courier 02/18/22 OV notes, patient is on 4-5L.  Pulse ox was 98% 5L [place]. [name] stated patient did seem to have some confusion during conversation.  [name] stated if patient is on 6 liters [place] he can get a 10 liter concentrator, but will need recent OV notes and qualifying walk.    Message routed to Dr. Craige Cotta as Lorain Childes

## 2022-03-06 NOTE — Telephone Encounter (Signed)
Call to [facility] unable to fill prescription for Tramadol per Dr. Sloan Leiter as she is not on the list of approved for this patient's Tramadol.  New prescription will need to be written by Dr. Welton Flakes as he has written Tramadol prescriptions for patient in the past.

## 2022-03-06 NOTE — Telephone Encounter (Signed)
[name] with [facility] called regarding an order that was sent in for the patient's 02.  She needs to get clarification because the order was sent by Dr. Oswaldo Done but the patient wanted her to speak with Dr. Craige Cotta to make sure that it was correct.  Please advise and call to discuss further.

## 2022-03-07 ENCOUNTER — Other Ambulatory Visit: Payer: Self-pay | Admitting: Internal Medicine

## 2022-03-07 ENCOUNTER — Other Ambulatory Visit: Payer: Self-pay | Admitting: *Deleted

## 2022-03-07 ENCOUNTER — Other Ambulatory Visit (HOSPITAL_COMMUNITY): Payer: Self-pay

## 2022-03-07 ENCOUNTER — Telehealth: Payer: Self-pay | Admitting: Pulmonary Disease

## 2022-03-07 DIAGNOSIS — K860 Alcohol-induced chronic pancreatitis: Secondary | ICD-10-CM

## 2022-03-07 MED ORDER — TRAMADOL HCL 50 MG PO TABS
100.0000 mg | ORAL_TABLET | Freq: Three times a day (TID) | ORAL | 0 refills | Status: DC | PRN
  Filled 2022-03-07 (×2): qty 180, 30d supply, fill #0

## 2022-03-07 MED ORDER — PREDNISONE 5 MG PO TABS
5.0000 mg | ORAL_TABLET | Freq: Every day | ORAL | 0 refills | Status: DC
  Filled 2022-03-07: qty 12, 12d supply, fill #0

## 2022-03-07 NOTE — Telephone Encounter (Signed)
Pt requesting to speak with a getting his Tramadol refill. Please call pt back.

## 2022-03-07 NOTE — Telephone Encounter (Signed)
Patient called to get clarification on the dosage of his Prednisone medication.  He stated he thinks he is going to run out of the meds. On Saturday according to how the doctor told him to take the pills.  He states he only has 30 pills and is not sure of how he should be taking them.  Please call to clarify.  CB# [phone]

## 2022-03-07 NOTE — Telephone Encounter (Signed)
If he is maintaining his SpO2 at 98% on 5 liters, then he doesn't need a change to his oxygen set up at this time.

## 2022-03-07 NOTE — Telephone Encounter (Signed)
Called and spoke with patient, he states the pharmacy only gave him 30 tablets when his prednisone was filled on 8/28.  He says they did not take into account the taper he was supposed to do prior to taking 1 tablet daily.  I verified his pharmacy and sent in 12 tablets to the [facility] pharmacy.  He verbalized understanding.  Nothing further needed.

## 2022-03-07 NOTE — Telephone Encounter (Signed)
Call from [facility] prescription refill for Tramadol will need to be done by Dr. Welton Flakes per patient insurance coverage.

## 2022-03-08 NOTE — Telephone Encounter (Signed)
Called and spoke with pt letting him know the info per VS and he verbalized understanding. Nothing further needed. ?

## 2022-03-11 NOTE — Telephone Encounter (Signed)
Patient notified that [facility] received order for POC. Also, let him know that POC may not work for him as he is on 4-5 L/min. Usually POC is limited to 2-3 L/min. Also, he may need to schedule appt for O2 sats with and w/o oxygen if they are able to provide POC. States understanding.  States he uses [facility] for home Oxygen. CM sent to Ashly at [place].

## 2022-03-11 NOTE — Telephone Encounter (Signed)
Eulas Post, Jasmine  Annalyce Lanpher, Orvis Brill, RN; Winnifred Friar, [place]; [place], Eleonore Chiquito, [place]; 1 other  got it - thanks      ----- Message -----  From: Velora Heckler, RN  Sent: 03/11/2022  10:44 AM EDT  To: Darlina Guys; Miquel Dunn; Stephannie Peters; *  Subject: POC                                             Hello,   There is an order in Epic for POC. F2F was 02/20/22.   Thank you!

## 2022-03-14 ENCOUNTER — Other Ambulatory Visit (HOSPITAL_COMMUNITY): Payer: Self-pay

## 2022-03-20 DIAGNOSIS — J432 Centrilobular emphysema: Secondary | ICD-10-CM | POA: Diagnosis not present

## 2022-03-20 DIAGNOSIS — J9611 Chronic respiratory failure with hypoxia: Secondary | ICD-10-CM | POA: Diagnosis not present

## 2022-03-25 ENCOUNTER — Other Ambulatory Visit (HOSPITAL_COMMUNITY): Payer: Self-pay

## 2022-03-26 ENCOUNTER — Other Ambulatory Visit (HOSPITAL_COMMUNITY): Payer: Self-pay

## 2022-04-01 ENCOUNTER — Other Ambulatory Visit: Payer: Self-pay | Admitting: Student

## 2022-04-01 ENCOUNTER — Other Ambulatory Visit: Payer: Self-pay

## 2022-04-01 ENCOUNTER — Other Ambulatory Visit (HOSPITAL_COMMUNITY): Payer: Self-pay

## 2022-04-01 DIAGNOSIS — K860 Alcohol-induced chronic pancreatitis: Secondary | ICD-10-CM

## 2022-04-01 MED ORDER — TRAMADOL HCL 50 MG PO TABS
100.0000 mg | ORAL_TABLET | Freq: Three times a day (TID) | ORAL | 0 refills | Status: DC | PRN
  Filled 2022-04-01: qty 180, 30d supply, fill #0

## 2022-04-01 NOTE — Telephone Encounter (Signed)
Refilled tramadol 5 days early. Please do not refill before 05/02/22. If he requests another early refill, please encourage to come to the clinic to talk about pain management.

## 2022-04-02 ENCOUNTER — Other Ambulatory Visit (HOSPITAL_COMMUNITY): Payer: Self-pay

## 2022-04-04 ENCOUNTER — Other Ambulatory Visit: Payer: Self-pay | Admitting: *Deleted

## 2022-04-04 ENCOUNTER — Other Ambulatory Visit (HOSPITAL_COMMUNITY): Payer: Self-pay

## 2022-04-04 ENCOUNTER — Telehealth: Payer: Self-pay

## 2022-04-04 DIAGNOSIS — K860 Alcohol-induced chronic pancreatitis: Secondary | ICD-10-CM

## 2022-04-04 NOTE — Telephone Encounter (Signed)
Call from patient asking about when he can get a refill on his Tramadol.  Message on prescription states  not to fill until 05/07/2022.  Not sure why he is unable to get refill due on 04/06/2022.   Insurance will only take prescription from Dr. Humphrey Rolls.

## 2022-04-04 NOTE — Telephone Encounter (Signed)
Pt is requesting a call back .Marland Kitchen He stated that he went to pick his up  tramadol and the dr placed a note dont fill until 11/9 but the last fill date that he got was 9/14

## 2022-04-04 NOTE — Telephone Encounter (Signed)
Error

## 2022-04-05 ENCOUNTER — Other Ambulatory Visit (HOSPITAL_COMMUNITY): Payer: Self-pay

## 2022-04-05 ENCOUNTER — Other Ambulatory Visit: Payer: Self-pay | Admitting: Internal Medicine

## 2022-04-05 DIAGNOSIS — K860 Alcohol-induced chronic pancreatitis: Secondary | ICD-10-CM

## 2022-04-05 MED ORDER — TRAMADOL HCL 50 MG PO TABS
100.0000 mg | ORAL_TABLET | Freq: Three times a day (TID) | ORAL | 0 refills | Status: DC | PRN
  Filled 2022-04-05 (×2): qty 180, 30d supply, fill #0

## 2022-04-05 MED ORDER — TRAMADOL HCL 50 MG PO TABS
100.0000 mg | ORAL_TABLET | Freq: Three times a day (TID) | ORAL | 0 refills | Status: DC | PRN
  Filled 2022-04-05: qty 180, 30d supply, fill #0

## 2022-04-05 NOTE — Telephone Encounter (Signed)
Message sent to PCP and Yellow Team in separate encounter.

## 2022-04-08 ENCOUNTER — Other Ambulatory Visit (HOSPITAL_COMMUNITY): Payer: Self-pay

## 2022-04-10 ENCOUNTER — Telehealth: Payer: Self-pay | Admitting: Pulmonary Disease

## 2022-04-10 ENCOUNTER — Other Ambulatory Visit (HOSPITAL_COMMUNITY): Payer: Self-pay

## 2022-04-10 DIAGNOSIS — J9611 Chronic respiratory failure with hypoxia: Secondary | ICD-10-CM

## 2022-04-10 NOTE — Telephone Encounter (Signed)
Patient's mother called regarding an order for a new nebulizer machine.  She stated that the patient's machine is more than five years old and the company told her the doctor would have to put in a new order for an updated machine.  Mother stated that the patient has been without is medication for two days.  Please advise and call to confirm at [phone]

## 2022-04-11 NOTE — Telephone Encounter (Signed)
Called patient's mother at the number provided but she did not answer. The VM was full so I was unable to leave a VM. Will attempt to call back later.

## 2022-04-11 NOTE — Telephone Encounter (Signed)
Patients mother called again and wanted an update. Patient's mother gave [phone] as the best contact number   Please advise and call to confirm [phone]

## 2022-04-12 ENCOUNTER — Other Ambulatory Visit (HOSPITAL_COMMUNITY): Payer: Self-pay

## 2022-04-12 NOTE — Telephone Encounter (Signed)
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

## 2022-04-12 NOTE — Telephone Encounter (Signed)
Called patient and told him I was ordering new nebulizer machine and supplies for him. He states that DME is [facility]. Order placed. Nothing further needed

## 2022-04-12 NOTE — Telephone Encounter (Signed)
Called and spoke with pt's mother who states that pt's current nebulizer was originally not working but she stated that pt was able to get it working again. Alice stated that pt's current machine is at least 59 years old. I asked Alice who pt's DME was and she said that pt uses [facility].  Dr. Halford Chessman, with pt's current nebulizer machine being at least 59 years old, would you be okay if we placed an order for pt to receive a new nebulizer machine since he has had some issues with his current machine stopping working for a bit.

## 2022-04-12 NOTE — Telephone Encounter (Signed)
Patient's mother returning a call about nebulizer order. Patient's DME is [facility].

## 2022-04-15 ENCOUNTER — Other Ambulatory Visit (HOSPITAL_COMMUNITY): Payer: Self-pay

## 2022-04-19 DIAGNOSIS — J432 Centrilobular emphysema: Secondary | ICD-10-CM | POA: Diagnosis not present

## 2022-04-19 DIAGNOSIS — J9611 Chronic respiratory failure with hypoxia: Secondary | ICD-10-CM | POA: Diagnosis not present

## 2022-04-25 ENCOUNTER — Other Ambulatory Visit: Payer: Medicaid Other | Admitting: Internal Medicine

## 2022-04-25 ENCOUNTER — Other Ambulatory Visit (HOSPITAL_COMMUNITY): Payer: Self-pay

## 2022-04-25 ENCOUNTER — Encounter: Payer: Self-pay | Admitting: Internal Medicine

## 2022-04-25 VITALS — BP 120/70 | HR 98 | Temp 99.1°F | Resp 20

## 2022-04-25 DIAGNOSIS — E43 Unspecified severe protein-calorie malnutrition: Secondary | ICD-10-CM

## 2022-04-25 DIAGNOSIS — F419 Anxiety disorder, unspecified: Secondary | ICD-10-CM

## 2022-04-25 DIAGNOSIS — J439 Emphysema, unspecified: Secondary | ICD-10-CM

## 2022-04-25 DIAGNOSIS — J9611 Chronic respiratory failure with hypoxia: Secondary | ICD-10-CM

## 2022-04-25 MED ORDER — LORAZEPAM 0.5 MG PO TABS
0.5000 mg | ORAL_TABLET | Freq: Two times a day (BID) | ORAL | 5 refills | Status: DC | PRN
  Filled 2022-04-25 – 2022-05-03 (×4): qty 60, 30d supply, fill #0
  Filled 2022-05-31 – 2022-06-03 (×2): qty 60, 30d supply, fill #1
  Filled 2022-06-29: qty 60, 30d supply, fill #2
  Filled 2022-07-31: qty 60, 30d supply, fill #3
  Filled 2022-08-27: qty 60, 30d supply, fill #4
  Filled 2022-09-24 – 2022-09-25 (×2): qty 60, 30d supply, fill #5

## 2022-04-25 NOTE — Progress Notes (Signed)
[facility] Follow-Up Visit Telephone: [phone]  Fax: [phone]   Date of encounter: 04/25/22 1:07 PM PATIENT NAME: [name] [address] [place] [address]   [phone] (home)  DOB: 19-May-1961 MRN: [id number] PRIMARY CARE PROVIDER:    Nani Gasser, MD,  [place] [address] [phone]  REFERRING PROVIDER:   Nani Gasser, MD [address] [place],  [place] [address] [phone]  RESPONSIBLE PARTY:    Contact Information     Name Relation Home Work [place] Mother  [phone]        I met face to face with patient and family in his home--his mother came and went during visit.   Palliative Care was asked to follow this patient by consultation request of  Samuel Gasser, MD to address advance care planning and complex medical decision making. This is follow-up visit.                                     ASSESSMENT AND PLAN / RECOMMENDATIONS:   Advance Care Planning/Goals of Care: Goals include to maximize quality of life and symptom management. Patient/health care surrogate gave his/her permission to discuss.Our advance care planning conversation included a discussion about:    The value and importance of advance care planning  Experiences with loved ones who have been seriously ill or have died  Exploration of personal, cultural or spiritual beliefs that might influence medical decisions  Exploration of goals of care in the event of a sudden injury or illness  Identification  of a healthcare agent--brother (mother listed as contact in chart, but she has dementia and repeats herself, calls [place] repeatedly) Review and updating or creation of an  advance directive document . Decision not to resuscitate or to de-escalate disease focused treatments due to poor prognosis. CODE STATUS:  DNR, MOST on file   Symptom Management/Plan: 1. Giant bullous emphysema  ([place]) -progressively worse, using 4.5L O2 full time now via concentrator, doing 6 nebs per day, on daily steroids and now has portable O2 he uses (backpack version) at 5L when moving, gets lightheaded getting up out of chair and had a fall the other night - LORazepam (ATIVAN) 0.5 MG tablet; Take 1 tablet (0.5 mg total) by mouth 2 (two) times daily as needed for anxiety.  Dispense: 60 tablet; Refill: 5  2. Anxiety -remains an issue for him despite lexapro, taking ativan and has not been requesting early so seems that this is working out ok (though his mother worries about him being on this)--he's jittery and tremulous even with it at times (today he was worse than previous visits) - LORazepam (ATIVAN) 0.5 MG tablet; Take 1 tablet (0.5 mg total) by mouth 2 (two) times daily as needed for anxiety.  Dispense: 60 tablet; Refill: 5  3. Chronic respiratory failure with hypoxia (HCC) -continue oxygen therapy as above - LORazepam (ATIVAN) 0.5 MG tablet; Take 1 tablet (0.5 mg total) by mouth 2 (two) times daily as needed for anxiety.  Dispense: 60 tablet; Refill: 5   4.  Protein calorie malnutrition Remains an issue as his intake consists almost purely of ensure due to his pancreatitis; will drink 6 strawberry ensure per day, also takes some gatorade, and will occasionally eat a cinnamon roll No recent weight, but prominent ribs, bones of extremities and buccal wasting present  Follow up  Palliative Care Visit: Palliative care will continue to follow for complex medical decision making, advance care planning, and clarification of goals. Return 8 weeks or prn by RN/SW.  Socially speaking, pt requests application for full disability--says our social worker was going to send to him.  It does not sound like he contacted social security on his own as recommended.  This visit was coded based on medical decision making (MDM).  PPS: 50%  HOSPICE ELIGIBILITY/DIAGNOSIS: TBD  Chief Complaint: Follow-up  palliative visit  HISTORY OF PRESENT ILLNESS:  [name] is a 61 y.o. 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  See a/p above for details.   History obtained from review of EMR, discussion with primary team, and interview with family, facility staff/caregiver and/or [name].  I reviewed available labs, medications, imaging, studies and related documents from the EMR.  Records reviewed and summarized above.   ROS Review of Systems  see hpi  Physical Exam: There were no vitals filed for this visit. There is no height or weight on file to calculate BMI. Wt Readings from Last 500 Encounters:  02/21/22 124 lb (56.2 kg)  02/20/22 125 lb 9.6 oz (57 kg)  02/18/22 124 lb 12.8 oz (56.6 kg)  10/26/21 123 lb 6.4 oz (56 kg)  10/12/21 123 lb 0.3 oz (55.8 kg)  10/05/21 123 lb (55.8 kg)  09/28/21 123 lb 9.6 oz (56.1 kg)  09/17/21 124 lb 12.8 oz (56.6 kg)  08/31/21 127 lb 9.6 oz (57.9 kg)  07/20/21 126 lb 1.6 oz (57.2 kg)  05/07/21 122 lb 8 oz (55.6 kg)  01/18/21 122 lb 6.4 oz (55.5 kg)  07/17/20 119 lb 9.6 oz (54.3 kg)  06/28/20 121 lb 14.4 oz (55.3 kg)  05/15/20 121 lb 12.8 oz (55.2 kg)  03/27/20 124 lb 6.4 oz (56.4 kg)  11/09/19 127 lb 11.2 oz (57.9 kg)  07/16/19 130 lb (59 kg)  07/06/19 130 lb 9.6 oz (59.2 kg)  03/26/19 132 lb 9.6 oz (60.1 kg)  03/18/19 132 lb 3.2 oz (60 kg)  06/29/18 112 lb 3.2 oz (50.9 kg)  06/09/18 106 lb 8 oz (48.3 kg)  05/27/18 110 lb (49.9 kg)  05/25/18 110 lb 11.2 oz (50.2 kg)  05/22/18 120 lb (54.4 kg)  02/03/18 106 lb 14.4 oz (48.5 kg)  01/09/18 109 lb (49.4 kg)  12/02/17 111 lb 3.2 oz (50.4 kg)  10/28/17 113 lb 4.8 oz (51.4 kg)  08/26/17 116 lb (52.6 kg)  08/01/17 114 lb 8 oz (51.9 kg)  07/07/17 116 lb (52.6 kg)  06/19/17 121 lb (54.9 kg)  06/06/17 121 lb 6.4 oz (55.1 kg)  05/24/17 119 lb  11.2 oz (54.3 kg)  01/12/17 128 lb (58.1 kg)  12/04/16 132 lb (59.9 kg)  11/28/16 140 lb (63.5 kg)  11/26/16 140 lb (63.5 kg)  11/06/16 138 lb (62.6 kg)  10/14/16 138 lb 6.4 oz (62.8 kg)  09/02/16 130 lb (59 kg)  11/04/12 130 lb (59 kg)  02/02/12 127 lb (57.6 kg)  04/08/11 135 lb 9.6 oz (61.5 kg)  03/14/11 135 lb 12.8 oz (61.6 kg)  12/06/10 131 lb 1.6 oz (59.5 kg)  11/15/10 133 lb (60.3 kg)  09/27/10 126 lb 14.4 oz (57.6 kg)  07/27/10 124 lb 11.2 oz (56.6 kg)   Physical Exam Constitutional:      General: He is  in acute distress.     Appearance: He is ill-appearing.  HENT:     Head: Normocephalic and atraumatic.  Cardiovascular:     Rate and Rhythm: Regular rhythm. Tachycardia present.     Heart sounds: No murmur heard. Pulmonary:     Comments: Prolonged expiration, poor air movement overall, wearing 4.5L O2 via concentrator Abdominal:     General: Bowel sounds are normal.     Tenderness: There is no abdominal tenderness. There is no guarding or rebound.  Musculoskeletal:        General: Normal range of motion.  Skin:    General: Skin is warm and dry.  Neurological:     Mental Status: He is alert.     Motor: Weakness present.  Psychiatric:     Comments: Anxious and jittery today, rapid tremor of bilateral hands, tapping feet     CURRENT PROBLEM LIST:  Patient Active Problem List   Diagnosis Date Noted   Chronic respiratory failure with hypoxia ([place]) 09/28/2021   COPD exacerbation ([place]) 09/17/2021   Giant bullous emphysema ([place]) 05/15/2020   Dyspnea on exertion 05/15/2020   Healthcare maintenance 05/15/2020   Goals of care, counseling/discussion 11/09/2019   Depression    Preventative health care 06/10/2018   Lung nodule 05/26/2018   COPD with emphysema ([place])    History of pneumothorax 05/19/2017   Chronic cholecystitis with calculus s/p lap cholecystectomy 11/28/2016 11/28/2016   Psoriasis    Chronic pancreatitis due to acute alcohol intoxication ([place])  04/13/2008   Dyslipidemia 08/13/2007    PAST MEDICAL HISTORY:  Active Ambulatory Problems    Diagnosis Date Noted   Dyslipidemia 08/13/2007   Chronic pancreatitis due to acute alcohol intoxication ([place]) 04/13/2008   Chronic cholecystitis with calculus s/p lap cholecystectomy 11/28/2016 11/28/2016   Psoriasis    History of pneumothorax 05/19/2017   COPD with emphysema ([place])    Lung nodule 05/26/2018   Preventative health care 06/10/2018   Depression    Goals of care, counseling/discussion 11/09/2019   Giant bullous emphysema ([place]) 05/15/2020   Dyspnea on exertion 05/15/2020   Healthcare maintenance 05/15/2020   COPD exacerbation ([place]) 09/17/2021   Chronic respiratory failure with hypoxia ([place]) 09/28/2021   Resolved Ambulatory Problems    Diagnosis Date Noted   HYPERKALEMIA 02/20/2010   Anxiety state, unspecified 08/29/2008   ALCOHOL ABUSE 04/23/2007   EROSIVE GASTRITIS 02/24/2008   BLOOD IN STOOL, OCCULT 01/18/2008   PANCREATITIS, HX OF 04/23/2007   MELENA, HX OF 38/75/6433   Alcoholic fatty liver 29/51/8841   Duodenitis    Acute on chronic respiratory failure with hypoxia (HCC)    Pneumothorax, right    S/P chest tube placement (HCC)    Acute respiratory failure with hypoxia (HCC)    Tobacco abuse    Acute bronchitis 05/26/2018   Past Medical History:  Diagnosis Date   Chronic pancreatitis ([place])    Duodenitis    Emphysema    History of alcohol abuse    Hyperlipidemia    RUE weakness    Stomach ulcer     SOCIAL HX:  Social History   Tobacco Use   Smoking status: Former    Packs/day: 0.10    Years: 40.00    Total pack years: 4.00    Types: Cigarettes    Quit date: 05/19/2018    Years since quitting: 3.9   Smokeless tobacco: Never   Tobacco comments:    4-5 cigs per day   Substance Use Topics   Alcohol use:  Not Currently    Comment: quit x 10 yrs.     ALLERGIES:  Allergies  Allergen Reactions   Nsaids Other (See Comments)    Bleeding gastritis       PERTINENT MEDICATIONS:  Outpatient Encounter Medications as of 04/25/2022  Medication Sig   albuterol (PROVENTIL) (2.5 MG/3ML) 0.083% nebulizer solution Take 3 mLs by nebulization every 6 (six) hours as needed for wheezing or shortness of breath.   albuterol (VENTOLIN HFA) 108 (90 Base) MCG/ACT inhaler Inhale 2 puffs into the lungs every 6 (six) hours as needed for wheezing or shortness of breath.   amitriptyline (ELAVIL) 50 MG tablet Take 1 tablet (50 mg total) by mouth at bedtime.   cefUROXime (CEFTIN) 500 MG tablet Take 1 tablet (500 mg total) by mouth 2 (two) times daily with a meal.   DULoxetine (CYMBALTA) 30 MG capsule Take 1 capsule (30 mg total) by mouth 2 (two) times daily.   formoterol (PERFOROMIST) 20 MCG/2ML nebulizer solution Take 2 mLs (20 mcg total) by nebulization 2 (two) times daily.   hydrOXYzine (ATARAX) 25 MG tablet Take 1 tablet (25 mg total) by mouth 3 (three) times daily as needed.   LORazepam (ATIVAN) 0.5 MG tablet Take 1 tablet (0.5 mg total) by mouth 2 (two) times daily as needed for anxiety.   omeprazole (PRILOSEC) 40 MG capsule Take 1 capsule (40 mg total) by mouth daily.   ondansetron (ZOFRAN) 4 MG tablet Take 1 tablet (4 mg total) by mouth every 6 (six) hours.   predniSONE (DELTASONE) 5 MG tablet Take 1 tablet (5 mg total) by mouth daily with breakfast.   predniSONE (DELTASONE) 5 MG tablet Take 1 tablet (5 mg total) by mouth daily with breakfast.   PULMICORT 0.5 MG/2ML nebulizer solution Inhale 2 mLs (0.5 mg total) into the lungs 2 (two) times daily.   revefenacin (YUPELRI) 175 MCG/3ML nebulizer solution Take 3 mLs (175 mcg total) by nebulization daily.   traMADol (ULTRAM) 50 MG tablet Take 2 tablets (100 mg total) by mouth every 8 (eight) hours as needed.   [DISCONTINUED] LORazepam (ATIVAN) 0.5 MG tablet Take 1 tablet (0.5 mg total) by mouth 2 (two) times daily as needed for anxiety.   [DISCONTINUED] sertraline (ZOLOFT) 50 MG tablet Take 1 tablet (50 mg total) by  mouth daily.   No facility-administered encounter medications on file as of 04/25/2022.    Thank you for the opportunity to participate in the care of [name].  The palliative care team will continue to follow. Please call our office at [phone] if we can be of additional assistance.   Hollace Kinnier, DO  COVID-19 PATIENT SCREENING TOOL Asked and negative response unless otherwise noted:  Have you had symptoms of covid, tested positive or been in contact with someone with symptoms/positive test in the past 5-10 days? no

## 2022-04-29 ENCOUNTER — Other Ambulatory Visit (HOSPITAL_COMMUNITY): Payer: Self-pay

## 2022-04-30 ENCOUNTER — Other Ambulatory Visit: Payer: Self-pay | Admitting: Student

## 2022-04-30 ENCOUNTER — Other Ambulatory Visit (HOSPITAL_COMMUNITY): Payer: Self-pay

## 2022-04-30 DIAGNOSIS — K860 Alcohol-induced chronic pancreatitis: Secondary | ICD-10-CM

## 2022-04-30 NOTE — Telephone Encounter (Signed)
traMADol (ULTRAM) 50 MG tablet   [facility] [place]

## 2022-04-30 NOTE — Telephone Encounter (Signed)
Last Tox[facility]ure 09/17/2021.  Last appointment 02/20/2022.  No future appointments

## 2022-05-01 ENCOUNTER — Other Ambulatory Visit (HOSPITAL_COMMUNITY): Payer: Self-pay

## 2022-05-01 MED ORDER — TRAMADOL HCL 50 MG PO TABS
100.0000 mg | ORAL_TABLET | Freq: Three times a day (TID) | ORAL | 0 refills | Status: DC | PRN
  Filled 2022-05-01 – 2022-05-07 (×6): qty 180, 30d supply, fill #0

## 2022-05-01 NOTE — Telephone Encounter (Signed)
Chart and PDMP reviewed. Will refill.

## 2022-05-02 ENCOUNTER — Other Ambulatory Visit (HOSPITAL_COMMUNITY): Payer: Self-pay

## 2022-05-03 ENCOUNTER — Other Ambulatory Visit (HOSPITAL_COMMUNITY): Payer: Self-pay

## 2022-05-03 ENCOUNTER — Other Ambulatory Visit: Payer: Self-pay | Admitting: *Deleted

## 2022-05-03 ENCOUNTER — Telehealth: Payer: Self-pay | Admitting: *Deleted

## 2022-05-06 ENCOUNTER — Other Ambulatory Visit (HOSPITAL_COMMUNITY): Payer: Self-pay

## 2022-05-06 ENCOUNTER — Telehealth: Payer: Self-pay

## 2022-05-06 NOTE — Telephone Encounter (Signed)
PA for pt ( TRAMADOL HCL )  came through on cover my meds  for his renewal .. Was submitted with last office notes  .Marland Kitchen Awaiting approval or denial

## 2022-05-07 ENCOUNTER — Other Ambulatory Visit (HOSPITAL_COMMUNITY): Payer: Self-pay

## 2022-05-07 NOTE — Telephone Encounter (Signed)
DECISION :     Approved today   PA Case: [id number], Status: Approved,    Coverage Starts on: 05/07/2022 12:00:00 AM, Coverage Ends on: 11/03/2022 12:00:00 AM.   Drug traMADol HCl 50MG  tablets       ( COPY SENT TO PHARMACY ALSO )

## 2022-05-07 NOTE — Telephone Encounter (Signed)
UPDATE :  more info  needed on why patient needs to continue  opioid treatment  and current plan  care ... So I sent in  the  note attached  for the following :   Chronic pancreatitis due to acute alcohol intoxication [facility]) Problem Detail  Noted: 04/13/2008  Last Assessment & Plan 02/20/2022 Office Visit Written 02/20/2022  2:59 PM by Marrianne Mood, MD   On a chronic pain regimen of tramadol and amitriptyline.  Drinks Ensure for nutrition.  Does not eat solid food due to pain.  Weight is stable.  No changes to medical management for now.     Relevant Medications  omeprazole (PRILOSEC) 40 MG capsule  traMADol (ULTRAM) 50 MG tablet      Awaiting  approval or denial

## 2022-05-13 ENCOUNTER — Other Ambulatory Visit (HOSPITAL_COMMUNITY): Payer: Self-pay

## 2022-05-15 ENCOUNTER — Other Ambulatory Visit (HOSPITAL_COMMUNITY): Payer: Self-pay

## 2022-05-17 ENCOUNTER — Other Ambulatory Visit (HOSPITAL_COMMUNITY): Payer: Self-pay

## 2022-05-21 ENCOUNTER — Telehealth: Payer: Self-pay | Admitting: Pulmonary Disease

## 2022-05-21 NOTE — Telephone Encounter (Signed)
[facility] stated that pt needs an office visit scheduled for neb start order, they can not go based off of phone notes. Unless theres an office visit AVS you can add notes on within the last 6 months a new OV appt is needed.

## 2022-05-23 ENCOUNTER — Other Ambulatory Visit: Payer: Self-pay

## 2022-05-24 ENCOUNTER — Other Ambulatory Visit (HOSPITAL_COMMUNITY): Payer: Self-pay

## 2022-05-28 ENCOUNTER — Other Ambulatory Visit: Payer: Self-pay | Admitting: Internal Medicine

## 2022-05-28 ENCOUNTER — Other Ambulatory Visit (HOSPITAL_COMMUNITY): Payer: Self-pay

## 2022-05-28 DIAGNOSIS — F32A Depression, unspecified: Secondary | ICD-10-CM

## 2022-05-28 MED ORDER — AMITRIPTYLINE HCL 50 MG PO TABS
50.0000 mg | ORAL_TABLET | Freq: Every day | ORAL | 5 refills | Status: DC
  Filled 2022-05-28: qty 30, 30d supply, fill #0
  Filled 2022-06-22: qty 30, 30d supply, fill #1
  Filled 2022-07-19: qty 30, 30d supply, fill #2
  Filled 2022-08-16: qty 30, 30d supply, fill #3
  Filled 2022-09-13: qty 30, 30d supply, fill #4
  Filled 2022-10-11 (×2): qty 30, 30d supply, fill #5

## 2022-05-28 NOTE — Telephone Encounter (Signed)
Chart reviewed. Refill appropriate. 

## 2022-05-29 ENCOUNTER — Other Ambulatory Visit (HOSPITAL_COMMUNITY): Payer: Self-pay

## 2022-05-30 ENCOUNTER — Other Ambulatory Visit (HOSPITAL_COMMUNITY): Payer: Self-pay

## 2022-05-31 ENCOUNTER — Ambulatory Visit: Payer: Medicaid Other | Admitting: Nurse Practitioner

## 2022-05-31 ENCOUNTER — Encounter: Payer: Self-pay | Admitting: Nurse Practitioner

## 2022-05-31 ENCOUNTER — Other Ambulatory Visit: Payer: Self-pay | Admitting: Student

## 2022-05-31 ENCOUNTER — Ambulatory Visit (INDEPENDENT_AMBULATORY_CARE_PROVIDER_SITE_OTHER): Payer: Medicaid Other

## 2022-05-31 ENCOUNTER — Other Ambulatory Visit (HOSPITAL_COMMUNITY): Payer: Self-pay

## 2022-05-31 VITALS — BP 110/64 | HR 102 | Ht 68.0 in | Wt 124.6 lb

## 2022-05-31 DIAGNOSIS — J449 Chronic obstructive pulmonary disease, unspecified: Secondary | ICD-10-CM | POA: Diagnosis not present

## 2022-05-31 DIAGNOSIS — J9611 Chronic respiratory failure with hypoxia: Secondary | ICD-10-CM | POA: Diagnosis not present

## 2022-05-31 DIAGNOSIS — J441 Chronic obstructive pulmonary disease with (acute) exacerbation: Secondary | ICD-10-CM

## 2022-05-31 DIAGNOSIS — J439 Emphysema, unspecified: Secondary | ICD-10-CM | POA: Diagnosis not present

## 2022-05-31 DIAGNOSIS — K860 Alcohol-induced chronic pancreatitis: Secondary | ICD-10-CM

## 2022-05-31 MED ORDER — DOXYCYCLINE HYCLATE 100 MG PO TABS
100.0000 mg | ORAL_TABLET | Freq: Two times a day (BID) | ORAL | 0 refills | Status: AC
  Filled 2022-05-31: qty 14, 7d supply, fill #0

## 2022-05-31 MED ORDER — PREDNISONE 10 MG PO TABS
ORAL_TABLET | ORAL | 0 refills | Status: AC
  Filled 2022-05-31: qty 30, 12d supply, fill #0

## 2022-05-31 NOTE — Assessment & Plan Note (Signed)
Stable without increased O2 requirement. Goal >88-90% 

## 2022-05-31 NOTE — Patient Instructions (Addendum)
-  Continue Albuterol inhaler 2 puffs or 3 mL neb every 6 hours as needed for shortness of breath or wheezing. Notify if symptoms persist despite rescue inhaler/neb use. -Continue Perforomist 2 mLs neb Twice daily -Continue pulmicort 1 vial neb Twice daily. Brush tongue and rinse mouth well afterwards -Continue Yupelri 3 mLs neb daily  -Continue supplemental oxygen 3-4 lpm for goal oxygen saturation >88-90% -Restart guaifenesin 600 mg Twice daily for chest congestion/cough -Flutter valve 2-3 times a day after nebulizer treatments    Prednisone taper. 4 tabs for 3 days, then 3 tabs for 3 days, 2 tabs for 3 days, then 1 tab for 3 days, then return to 5 mg daily. Take in AM with food.  Doxycycline 1 tab Twice daily for 7 days. Take with food. Wear sunscreen when taking - increases risk for sunburns   Monitor oxygen at home. If you are having levels 88% or less on 4 lpm supplemental O2, please go to the ED for further evaluation.   Chest x ray today    Follow up in 2 weeks with Dr. Craige Cotta or Philis Nettle. If symptoms do not improve or worsen, please contact office for sooner follow up or seek emergency care

## 2022-05-31 NOTE — Assessment & Plan Note (Signed)
AECOPD likely secondary to poorly functioning nebulizer machine and lack of maintenance therapies. His DME company declined to provide him with a new machine in October despite him having being seen here two months prior (01/2022). Orders were again placed in November and he was told he would have to have another face to face visit before they would provide him with one. Attempted to contact [employer] to ensure that they provide him with new machine STAT and inform them that depriving patient of new machine puts him at risk for hospitalization. No answer. Orders placed for STAT machine. We will continue to follow up on this. Encouraged the patient to also call. Regarding his AECOPD, we will treat him with empiric doxycycline course and prednisone taper. He will then return to his daily 5 mg. CXR to rule out superimposed infection. He will continue to use his current neb machine to the best of his ability, until he receives a new one. VS stable today on baseline O2 4 lpm. Strict return/ED precautions.   Patient Instructions  -Continue Albuterol inhaler 2 puffs or 3 mL neb every 6 hours as needed for shortness of breath or wheezing. Notify if symptoms persist despite rescue inhaler/neb use. -Continue Perforomist 2 mLs neb Twice daily -Continue pulmicort 1 vial neb Twice daily. Brush tongue and rinse mouth well afterwards -Continue Yupelri 3 mLs neb daily  -Continue supplemental oxygen 3-4 lpm for goal oxygen saturation >88-90% -Restart guaifenesin 600 mg Twice daily for chest congestion/cough -Flutter valve 2-3 times a day after nebulizer treatments    Prednisone taper. 4 tabs for 3 days, then 3 tabs for 3 days, 2 tabs for 3 days, then 1 tab for 3 days, then return to 5 mg daily. Take in AM with food.  Doxycycline 1 tab Twice daily for 7 days. Take with food. Wear sunscreen when taking - increases risk for sunburns   Monitor oxygen at home. If you are having levels 88% or less on 4 lpm supplemental O2,  please go to the ED for further evaluation.   Chest x ray today    Follow up in 2 weeks with Dr. Craige Cotta or Philis Nettle. If symptoms do not improve or worsen, please contact office for sooner follow up or seek emergency care

## 2022-05-31 NOTE — Progress Notes (Signed)
@Patient  ID: , male    DOB: Feb 28, 1961, 61 y.o.   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  He is a patient of Dr. 46 and last seen in office on 02/18/2022.  Past medical history significant for chronic pancreatitis, chronic cholecystitis, psoriasis on methotrexate, HLD, depression.  TEST/EVENTS:  04/06/2019 PFTs: FVC 85%, FEV1 46%, ratio 57, TLC 107%, DLCOunc 33%.  Severe obstructive airway disease with increased lung volumes and severe diffusion defect.  No BD 08/31/2021 CXR 2 view: Hyperinflation with emphysema and bronchitic changes.  There was scarring at the lingula, consistent with previous infection.  No acute airspace disease present. 09/28/2021 CXR 2 view: Hyperinflated lungs.  Mild streaky opacities in the anterior lower lobes bilaterally, unchanged and likely represent scarring or atelectasis.  02/18/2022: OV with Dr. 02/20/2022. Feels as though breathing is getting worse. Now requiring 4-5 lpm supplemental O2. Cough with yellow sputum. Enrolled with palliative care, which has been helping. He is maintained on triple therapy regimen and low dose daily steroids. Treated with course of cefuroxime and prednisone.   05/31/2022: Today - acute Patient presents today for acute visit due to increased shortness of breath and productive cough.  He is also here because his nebulizer machine has intermittently stopped working on him.  He contacted the medical supply company to get a new one who told him that he would need a new order.  This was placed back in October.  He is yet to receive this.  He was told he was going to need a face-to-face visit to get a new machine, even though he just had a visit  in August and it has been less than 6 months since he was seen last.  Tells me that his symptoms started over the last few weeks.  He is producing yellow sputum.  Has also noticed some more wheezing.  Feeling a little more fatigued than normal.  Eating and drinking well.  He did feel like he might have had a viral illness over the weekend but the symptoms resolved.  Had some chills.  Never checked his temperature so unsure if he actually had a fever.  Denies any hemoptysis, lower extremity swelling, orthopnea, URI symptoms.  No increased oxygen requirement.  He is currently on 4 L supplemental O2.  Allergies  Allergen Reactions   Nsaids Other (See Comments)    Bleeding gastritis    Immunization History  Administered Date(s) Administered   Influenza Whole 02/20/2010   Influenza,inj,Quad PF,6+ Mos 04/01/2017, 06/09/2018, 03/18/2019, 03/27/2020   Influenza-Unspecified 03/28/2017, 05/07/2021   PFIZER(Purple Top)SARS-COV-2 Vaccination 09/17/2019, 10/21/2019, 05/31/2020   Tdap 07/27/2009, 05/07/2021    Past Medical History:  Diagnosis Date   Chronic pancreatitis (HCC)    Depression    Duodenitis    by EGD 1/07   Emphysema    unclear diagnosis noted in previous EMR   History of alcohol abuse    Hyperlipidemia    Psoriasis    extensive, responds to strong steroids, unable to start molecular therapies due to cost   RUE weakness    2/2 cervical spondylosis   Stomach ulcer     Tobacco History: Social History   Tobacco Use  Smoking Status Former  Packs/day: 0.10   Years: 40.00   Total pack years: 4.00   Types: Cigarettes   Quit date: 05/19/2018   Years since quitting: 4.0  Smokeless Tobacco Never  Tobacco Comments   4-5 cigs per day    Counseling given: Not Answered Tobacco comments: 4-5 cigs per day    Outpatient Medications Prior to Visit  Medication Sig Dispense Refill   albuterol (PROVENTIL) (2.5 MG/3ML) 0.083% nebulizer solution Take 3 mLs by nebulization every 6  (six) hours as needed for wheezing or shortness of breath. 75 mL 6   albuterol (VENTOLIN HFA) 108 (90 Base) MCG/ACT inhaler Inhale 2 puffs into the lungs every 6 (six) hours as needed for wheezing or shortness of breath. 18 g 5   amitriptyline (ELAVIL) 50 MG tablet Take 1 tablet (50 mg total) by mouth at bedtime. 30 tablet 5   cefUROXime (CEFTIN) 500 MG tablet Take 1 tablet (500 mg total) by mouth 2 (two) times daily with a meal. 14 tablet 0   DULoxetine (CYMBALTA) 30 MG capsule Take 1 capsule (30 mg total) by mouth 2 (two) times daily. 180 capsule 3   formoterol (PERFOROMIST) 20 MCG/2ML nebulizer solution Take 2 mLs (20 mcg total) by nebulization 2 (two) times daily. 120 mL 5   hydrOXYzine (ATARAX) 25 MG tablet Take 1 tablet (25 mg total) by mouth 3 (three) times daily as needed. 100 tablet 2   LORazepam (ATIVAN) 0.5 MG tablet Take 1 tablet (0.5 mg total) by mouth 2 (two) times daily as needed for anxiety. 60 tablet 5   omeprazole (PRILOSEC) 40 MG capsule Take 1 capsule (40 mg total) by mouth daily. 30 capsule 3   ondansetron (ZOFRAN) 4 MG tablet Take 1 tablet (4 mg total) by mouth every 6 (six) hours. 12 tablet 0   predniSONE (DELTASONE) 5 MG tablet Take 1 tablet (5 mg total) by mouth daily with breakfast. 30 tablet 5   PULMICORT 0.5 MG/2ML nebulizer solution Inhale 2 mLs (0.5 mg total) into the lungs 2 (two) times daily. 120 mL 11   traMADol (ULTRAM) 50 MG tablet Take 2 tablets (100 mg total) by mouth every 8 (eight) hours as needed. 180 tablet 0   predniSONE (DELTASONE) 5 MG tablet Take 1 tablet (5 mg total) by mouth daily with breakfast. 12 tablet 0   No facility-administered medications prior to visit.     Review of Systems:   Constitutional: No weight loss or gain, night sweats, fevers, chills. +fatigue HEENT: No headaches, difficulty swallowing, tooth/dental problems, or sore throat. No sneezing, itching, ear ache, nasal congestion, or post nasal drip CV:  No chest pain, orthopnea,  PND, swelling in lower extremities, anasarca, dizziness, palpitations, syncope Resp: +shortness of breath with exertion; productive cough; occasional wheeze. No hemoptysis. No chest wall deformity GI:  No heartburn, indigestion, abdominal pain, nausea, vomiting, diarrhea, change in bowel habits, loss of appetite, bloody stools.  Skin: No rash, lesions, ulcerations MSK:  No joint pain or swelling.  No decreased range of motion.  No back pain. Neuro: No dizziness or lightheadedness.  Psych: No depression or anxiety. Mood stable.     Physical Exam:  BP 110/64 (BP Location: Right Arm, Patient Position: Sitting, Cuff Size: Normal)   Pulse (!) 102   Ht 5\' 8"  (1.727 m)   Wt 124 lb 9.6 oz (56.5 kg)   SpO2 97%   BMI 18.95 kg/m   GEN: Pleasant, interactive, chronically-ill appearing; in no acute distress. HEENT:  Normocephalic and atraumatic. PERRLA. Sclera  white. Nasal turbinates pink, moist and patent bilaterally. No rhinorrhea present. Oropharynx pink and moist, without exudate or edema. No lesions, ulcerations, or postnasal drip.  NECK:  Supple w/ fair ROM. No JVD present. Normal carotid impulses w/o bruits. Thyroid symmetrical with no goiter or nodules palpated. No lymphadenopathy.   CV: RRR, no m/r/g, no peripheral edema. Pulses intact, +2 bilaterally. No cyanosis, pallor or clubbing. PULMONARY:  Unlabored, regular breathing. Diminished lung sounds bilaterally A&P. No accessory muscle use. No dullness to percussion. GI: BS present and normoactive. Soft, non-tender to palpation.  Neuro: A/Ox3. No focal deficits noted.   Skin: Warm, no lesions or rashe Psych: Normal affect and behavior. Judgement and thought content appropriate.     Lab Results:  CBC    Component Value Date/Time   WBC 16.7 (H) 10/12/2021 1350   RBC 4.63 10/12/2021 1350   HGB 14.9 10/12/2021 1350   HGB WILL FOLLOW 08/01/2017 1031   HCT 43.9 10/12/2021 1350   HCT WILL FOLLOW 08/01/2017 1031   PLT 204 10/12/2021  1350   PLT WILL FOLLOW 08/01/2017 1031   MCV 94.8 10/12/2021 1350   MCV WILL FOLLOW 08/01/2017 1031   MCH 32.2 10/12/2021 1350   MCHC 33.9 10/12/2021 1350   RDW 13.2 10/12/2021 1350   RDW WILL FOLLOW 08/01/2017 1031   LYMPHSABS 1.2 05/22/2018 1148   LYMPHSABS WILL FOLLOW 08/01/2017 1031   MONOABS 0.7 05/22/2018 1148   EOSABS 0.1 05/22/2018 1148   EOSABS WILL FOLLOW 08/01/2017 1031   BASOSABS 0.0 05/22/2018 1148   BASOSABS WILL FOLLOW 08/01/2017 1031    BMET    Component Value Date/Time   NA 141 10/12/2021 1350   NA 140 01/09/2018 1535   K 4.5 10/12/2021 1350   CL 108 10/12/2021 1350   CO2 21 (L) 10/12/2021 1350   GLUCOSE 96 10/12/2021 1350   BUN 25 (H) 10/12/2021 1350   BUN 14 01/09/2018 1535   CREATININE 1.16 10/12/2021 1350   CALCIUM 10.2 10/12/2021 1350   GFRNONAA >60 10/12/2021 1350   GFRAA >60 05/22/2018 1148    BNP No results found for: "BNP"   Imaging:  DG Chest 2 View  Result Date: 05/31/2022 CLINICAL DATA:  COPD. EXAM: CHEST - 2 VIEW COMPARISON:  Chest two views 10/12/2021 and 08/31/2021; CT chest 03/10/2019 FINDINGS: Cardiac silhouette and mediastinal contours are within normal limits. There is flattening of the diaphragms and high-grade hyperinflation, as seen on prior radiographs and CT. Large right upper lobe bullae are better seen on prior CT. High-grade cystic central other emphysematous changes. Unchanged thin linear scarring within the right lung, with portions related to the walls of the large bullae. No pleural effusion or pneumothorax. Mild multilevel degenerative disc changes of the thoracic spine. IMPRESSION: 1. No acute cardiopulmonary process. 2. Severe emphysema. Large right upper lobe bullae, better seen on prior CT. Electronically Signed   By: Neita Garnetonald  Viola M.D.   On: 05/31/2022 16:26         Latest Ref Rng & Units 04/06/2019   10:48 AM 07/14/2017    1:55 PM  PFT Results  FVC-Pre L 2.57  3.51   FVC-Predicted Pre % 56  76   FVC-Post L 3.89   4.08   FVC-Predicted Post % 85  88   Pre FEV1/FVC % % 57  41   Post FEV1/FCV % % 42  41   FEV1-Pre L 1.48  1.44   FEV1-Predicted Pre % 42  40   FEV1-Post L 1.62  1.69  DLCO uncorrected ml/min/mmHg 8.93  9.72   DLCO UNC% % 33  32   DLVA Predicted % 37  39   TLC L 7.04  7.09   TLC % Predicted % 107  107   RV % Predicted % 152  168     No results found for: "NITRICOXIDE"      Assessment & Plan:   COPD exacerbation (HCC) AECOPD likely secondary to poorly functioning nebulizer machine and lack of maintenance therapies. His DME company declined to provide him with a new machine in October despite him having being seen here two months prior (01/2022). Orders were again placed in November and he was told he would have to have another face to face visit before they would provide him with one. Attempted to contact [employer] to ensure that they provide him with new machine STAT and inform them that depriving patient of new machine puts him at risk for hospitalization. No answer. Orders placed for STAT machine. We will continue to follow up on this. Encouraged the patient to also call. Regarding his AECOPD, we will treat him with empiric doxycycline course and prednisone taper. He will then return to his daily 5 mg. CXR to rule out superimposed infection. He will continue to use his current neb machine to the best of his ability, until he receives a new one. VS stable today on baseline O2 4 lpm. Strict return/ED precautions.   Patient Instructions  -Continue Albuterol inhaler 2 puffs or 3 mL neb every 6 hours as needed for shortness of breath or wheezing. Notify if symptoms persist despite rescue inhaler/neb use. -Continue Perforomist 2 mLs neb Twice daily -Continue pulmicort 1 vial neb Twice daily. Brush tongue and rinse mouth well afterwards -Continue Yupelri 3 mLs neb daily  -Continue supplemental oxygen 3-4 lpm for goal oxygen saturation >88-90% -Restart guaifenesin 600 mg Twice daily for chest  congestion/cough -Flutter valve 2-3 times a day after nebulizer treatments    Prednisone taper. 4 tabs for 3 days, then 3 tabs for 3 days, 2 tabs for 3 days, then 1 tab for 3 days, then return to 5 mg daily. Take in AM with food.  Doxycycline 1 tab Twice daily for 7 days. Take with food. Wear sunscreen when taking - increases risk for sunburns   Monitor oxygen at home. If you are having levels 88% or less on 4 lpm supplemental O2, please go to the ED for further evaluation.   Chest x ray today    Follow up in 2 weeks with Dr. Craige Cotta or Philis Nettle. If symptoms do not improve or worsen, please contact office for sooner follow up or seek emergency care   Chronic respiratory failure with hypoxia (HCC) Stable without increased O2 requirement. Goal >88-90%     I spent 35 minutes of dedicated to the care of this patient on the date of this encounter to include pre-visit review of records, face-to-face time with the patient discussing conditions above, post visit ordering of testing, clinical documentation with the electronic health record, making appropriate referrals as documented, and communicating necessary findings to members of the patients care team.  Noemi Chapel, NP 05/31/2022  Pt aware and understands NP's role.

## 2022-06-03 ENCOUNTER — Other Ambulatory Visit (HOSPITAL_COMMUNITY): Payer: Self-pay

## 2022-06-03 MED ORDER — TRAMADOL HCL 50 MG PO TABS
100.0000 mg | ORAL_TABLET | Freq: Three times a day (TID) | ORAL | 0 refills | Status: DC | PRN
  Filled 2022-06-03 – 2022-06-04 (×2): qty 180, 30d supply, fill #0

## 2022-06-03 NOTE — Telephone Encounter (Signed)
traMADol (ULTRAM) 50 MG tablet   [facility] [place]

## 2022-06-03 NOTE — Progress Notes (Signed)
Reviewed and agree with assessment/plan.   Alando Colleran, MD [facility] 06/03/2022, 7:04 AM Pager:  [phone]  

## 2022-06-03 NOTE — Telephone Encounter (Signed)
PDMP reviewed. Refill appropriate. 

## 2022-06-04 ENCOUNTER — Other Ambulatory Visit (HOSPITAL_COMMUNITY): Payer: Self-pay

## 2022-06-04 DIAGNOSIS — J430 Unilateral pulmonary emphysema [MacLeod's syndrome]: Secondary | ICD-10-CM | POA: Diagnosis not present

## 2022-06-04 DIAGNOSIS — J449 Chronic obstructive pulmonary disease, unspecified: Secondary | ICD-10-CM | POA: Diagnosis not present

## 2022-06-06 ENCOUNTER — Other Ambulatory Visit (HOSPITAL_COMMUNITY): Payer: Self-pay

## 2022-06-07 ENCOUNTER — Other Ambulatory Visit (HOSPITAL_COMMUNITY): Payer: Self-pay

## 2022-06-12 ENCOUNTER — Encounter: Payer: Medicaid Other | Admitting: Student

## 2022-06-13 ENCOUNTER — Other Ambulatory Visit: Payer: Self-pay

## 2022-06-13 ENCOUNTER — Other Ambulatory Visit (HOSPITAL_COMMUNITY): Payer: Self-pay

## 2022-06-14 ENCOUNTER — Other Ambulatory Visit (HOSPITAL_COMMUNITY): Payer: Self-pay

## 2022-06-18 ENCOUNTER — Other Ambulatory Visit: Payer: Self-pay

## 2022-06-18 ENCOUNTER — Other Ambulatory Visit (HOSPITAL_COMMUNITY): Payer: Self-pay

## 2022-06-19 DIAGNOSIS — J9611 Chronic respiratory failure with hypoxia: Secondary | ICD-10-CM | POA: Diagnosis not present

## 2022-06-19 DIAGNOSIS — J432 Centrilobular emphysema: Secondary | ICD-10-CM | POA: Diagnosis not present

## 2022-06-20 ENCOUNTER — Other Ambulatory Visit (HOSPITAL_COMMUNITY): Payer: Self-pay

## 2022-06-21 ENCOUNTER — Encounter: Payer: Self-pay | Admitting: Nurse Practitioner

## 2022-06-21 ENCOUNTER — Ambulatory Visit: Payer: Medicaid Other | Admitting: Nurse Practitioner

## 2022-06-21 VITALS — BP 100/78 | HR 121 | Ht 68.0 in | Wt 122.8 lb

## 2022-06-21 DIAGNOSIS — J9611 Chronic respiratory failure with hypoxia: Secondary | ICD-10-CM

## 2022-06-21 DIAGNOSIS — A09 Infectious gastroenteritis and colitis, unspecified: Secondary | ICD-10-CM | POA: Diagnosis not present

## 2022-06-21 DIAGNOSIS — J441 Chronic obstructive pulmonary disease with (acute) exacerbation: Secondary | ICD-10-CM | POA: Diagnosis not present

## 2022-06-21 DIAGNOSIS — R197 Diarrhea, unspecified: Secondary | ICD-10-CM | POA: Insufficient documentation

## 2022-06-21 MED ORDER — PREDNISONE 10 MG PO TABS
ORAL_TABLET | ORAL | 0 refills | Status: DC
  Filled 2022-06-21: qty 30, 12d supply, fill #0

## 2022-06-21 MED ORDER — PREDNISONE 5 MG PO TABS
5.0000 mg | ORAL_TABLET | Freq: Every day | ORAL | 5 refills | Status: DC
  Filled 2022-06-21: qty 30, 30d supply, fill #0
  Filled 2022-07-29: qty 30, 30d supply, fill #1
  Filled 2022-08-20: qty 30, 30d supply, fill #2
  Filled 2022-09-15: qty 30, 30d supply, fill #3
  Filled 2022-10-14: qty 30, 30d supply, fill #4

## 2022-06-21 NOTE — Assessment & Plan Note (Signed)
Stable without increased O2 requirement. Goal >88-90% 

## 2022-06-21 NOTE — Assessment & Plan Note (Signed)
Likely viral given onset and lack of new medications/changes in diet. Encouraged him to push fluids and ensure he is remaining well hydrated. BRAT diet. If unable to keep fluids down or he develops worsening symptoms, advised to go to the ED.

## 2022-06-21 NOTE — Progress Notes (Signed)
@Patient  ID: [name], male    DOB: Sep 21, 1960, 61 y.o.   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  He is a patient of Dr. Juanetta Gosling and last seen in office on 05/31/2022 by [facility] NP.  Past medical history significant for chronic pancreatitis, chronic cholecystitis, psoriasis on methotrexate, HLD, depression.  TEST/EVENTS:  04/06/2019 PFTs: FVC 85%, FEV1 46%, ratio 57, TLC 107%, DLCOunc 33%.  Severe obstructive airway disease with increased lung volumes and severe diffusion defect.  No BD 08/31/2021 CXR 2 view: Hyperinflation with emphysema and bronchitic changes.  There was scarring at the lingula, consistent with previous infection.  No acute airspace disease present. 09/28/2021 CXR 2 view: Hyperinflated lungs.  Mild streaky opacities in the anterior lower lobes bilaterally, unchanged and likely represent scarring or atelectasis.  02/18/2022: OV with Dr. Halford Chessman. Feels as though breathing is getting worse. Now requiring 4-5 lpm supplemental O2. Cough with yellow sputum. Enrolled with palliative care, which has been helping. He is maintained on triple therapy regimen and low dose daily steroids. Treated with course of cefuroxime and prednisone.   05/31/2022: OV with Aysiah Jurado NP for acute visit due to increased shortness of breath and productive cough.  He is also here because his nebulizer machine has intermittently stopped working on him.  He contacted the medical supply company to get a new one who told him that he would need a new order.  This was placed back in October.  He is yet to receive this.  He was told he was going to need a face-to-face visit to get a new machine, even though he just had a visit in August and it has been less than 6 months since he was seen  last.  Tells me that his symptoms started over the last few weeks.  He is producing yellow sputum.  Has also noticed some more wheezing.  Feeling a little more fatigued than normal.  Eating and drinking well.  He did feel like he might have had a viral illness over the weekend but the symptoms resolved.  Had some chills.  Never checked his temperature so unsure if he actually had a fever.  Denies any hemoptysis, lower extremity swelling, orthopnea, URI symptoms.  No increased oxygen requirement.  He is currently on 4 L supplemental O2.  CXR without superimposed infection. Treated for AECOPD with empiric doxycycline and prednisone taper.   06/21/2022: Today - follow up Patient presents today for intended follow-up after being treated for AECOPD.  He was feeling better after his last course of steroids and antibiotics.  Cough has cleared up; still producing some occasional phlegm that is white.  Unfortunately over the last couple days, his breathing has gotten worse again.  He did come off of his steroids entirely.  Did not think he had any refills on this.  He has noticed some more wheezing.  Has not had any low oxygen levels.  He is also having frequent episodes of diarrhea.  Concerned that he may have a GI bug.  Denies any fevers, chills, body aches, nausea, vomiting.  Able to keep food and liquids down.  Eating and drinking okay.  He is using his triple therapy nebulizers.  He did get his new nebulizer machine since he was here last.  Using  his albuterol neb twice daily.  No increased oxygen requirements.  Allergies  Allergen Reactions   Nsaids Other (See Comments)    Bleeding gastritis    Immunization History  Administered Date(s) Administered   Influenza Whole 02/20/2010   Influenza,inj,Quad PF,6+ Mos 04/01/2017, 06/09/2018, 03/18/2019, 03/27/2020   Influenza-Unspecified 03/28/2017, 05/07/2021   PFIZER(Purple Top)SARS-COV-2 Vaccination 09/17/2019, 10/21/2019, 05/31/2020   Tdap 07/27/2009,  05/07/2021    Past Medical History:  Diagnosis Date   Chronic pancreatitis ([place])    Depression    Duodenitis    by EGD 1/07   Emphysema    unclear diagnosis noted in previous EMR   History of alcohol abuse    Hyperlipidemia    Psoriasis    extensive, responds to strong steroids, unable to start molecular therapies due to cost   RUE weakness    2/2 cervical spondylosis   Stomach ulcer     Tobacco History: Social History   Tobacco Use  Smoking Status Former   Packs/day: 0.10   Years: 40.00   Total pack years: 4.00   Types: Cigarettes   Quit date: 05/19/2018   Years since quitting: 4.0  Smokeless Tobacco Never  Tobacco Comments   4-5 cigs per day    Counseling given: Not Answered Tobacco comments: 4-5 cigs per day    Outpatient Medications Prior to Visit  Medication Sig Dispense Refill   albuterol (PROVENTIL) (2.5 MG/3ML) 0.083% nebulizer solution Take 3 mLs by nebulization every 6 (six) hours as needed for wheezing or shortness of breath. 75 mL 6   albuterol (VENTOLIN HFA) 108 (90 Base) MCG/ACT inhaler Inhale 2 puffs into the lungs every 6 (six) hours as needed for wheezing or shortness of breath. 18 g 5   PULMICORT 0.5 MG/2ML nebulizer solution Inhale 2 mLs (0.5 mg total) into the lungs 2 (two) times daily. 120 mL 11   revefenacin (YUPELRI) 175 MCG/3ML nebulizer solution Take 3 mLs (175 mcg total) by nebulization daily. 90 mL 3   amitriptyline (ELAVIL) 50 MG tablet Take 1 tablet (50 mg total) by mouth at bedtime. 30 tablet 5   cefUROXime (CEFTIN) 500 MG tablet Take 1 tablet (500 mg total) by mouth 2 (two) times daily with a meal. 14 tablet 0   DULoxetine (CYMBALTA) 30 MG capsule Take 1 capsule (30 mg total) by mouth 2 (two) times daily. 180 capsule 3   formoterol (PERFOROMIST) 20 MCG/2ML nebulizer solution Take 2 mLs (20 mcg total) by nebulization 2 (two) times daily. 120 mL 5   hydrOXYzine (ATARAX) 25 MG tablet Take 1 tablet (25 mg total) by mouth 3 (three) times  daily as needed. 100 tablet 2   LORazepam (ATIVAN) 0.5 MG tablet Take 1 tablet (0.5 mg total) by mouth 2 (two) times daily as needed for anxiety. 60 tablet 5   omeprazole (PRILOSEC) 40 MG capsule Take 1 capsule (40 mg total) by mouth daily. 30 capsule 3   ondansetron (ZOFRAN) 4 MG tablet Take 1 tablet (4 mg total) by mouth every 6 (six) hours. 12 tablet 0   traMADol (ULTRAM) 50 MG tablet Take 2 tablets (100 mg total) by mouth every 8 (eight) hours as needed. 180 tablet 0   predniSONE (DELTASONE) 5 MG tablet Take 1 tablet (5 mg total) by mouth daily with breakfast. (Patient not taking: Reported on 06/21/2022) 30 tablet 5   No facility-administered medications prior to visit.     Review of Systems:   Constitutional: No weight loss or gain, night sweats, fevers, chills. +fatigue  HEENT: No headaches, difficulty swallowing, tooth/dental problems, or sore throat. No sneezing, itching, ear ache, nasal congestion, or post nasal drip CV:  No chest pain, orthopnea, PND, swelling in lower extremities, anasarca, dizziness, palpitations, syncope Resp: +shortness of breath with exertion; cough (improved); occasional wheeze. No hemoptysis. No chest wall deformity GI:  +diarrhea. No heartburn, indigestion, abdominal pain, nausea, vomiting, change in bowel habits, loss of appetite, bloody stools.  Skin: No rash, lesions, ulcerations MSK:  No joint pain or swelling.  Neuro: No dizziness or lightheadedness.  Psych: No depression or anxiety. Mood stable.     Physical Exam:  BP 100/78 (BP Location: Left Arm)   Pulse (!) 121   Ht 5\' 8"  (1.727 m)   Wt 122 lb 12.8 oz (55.7 kg)   SpO2 98% Comment: On 4L oxygen  BMI 18.67 kg/m   GEN: Pleasant, interactive, chronically-ill appearing; in no acute distress. HEENT:  Normocephalic and atraumatic. PERRLA. Sclera white. Nasal turbinates pink, moist and patent bilaterally. No rhinorrhea present. Oropharynx pink and moist, without exudate or edema. No lesions,  ulcerations, or postnasal drip.  NECK:  Supple w/ fair ROM. No JVD present. Normal carotid impulses w/o bruits. Thyroid symmetrical with no goiter or nodules palpated. No lymphadenopathy.   CV: RRR, no m/r/g, no peripheral edema. Pulses intact, +2 bilaterally. No cyanosis, pallor or clubbing. PULMONARY:  Unlabored, regular breathing. Diminished lung sounds bilaterally A&P. No accessory muscle use. No dullness to percussion. GI: BS present and normoactive. Soft, non-tender to palpation.  Neuro: A/Ox3. No focal deficits noted.   Skin: Warm, no lesions or rashe Psych: Normal affect and behavior. Judgement and thought content appropriate.     Lab Results:  CBC    Component Value Date/Time   WBC 16.7 (H) 10/12/2021 1350   RBC 4.63 10/12/2021 1350   HGB 14.9 10/12/2021 1350   HGB WILL FOLLOW 08/01/2017 1031   HCT 43.9 10/12/2021 1350   HCT WILL FOLLOW 08/01/2017 1031   PLT 204 10/12/2021 1350   PLT WILL FOLLOW 08/01/2017 1031   MCV 94.8 10/12/2021 1350   MCV WILL FOLLOW 08/01/2017 1031   MCH 32.2 10/12/2021 1350   MCHC 33.9 10/12/2021 1350   RDW 13.2 10/12/2021 1350   RDW WILL FOLLOW 08/01/2017 1031   LYMPHSABS 1.2 05/22/2018 1148   LYMPHSABS WILL FOLLOW 08/01/2017 1031   MONOABS 0.7 05/22/2018 1148   EOSABS 0.1 05/22/2018 1148   EOSABS WILL FOLLOW 08/01/2017 1031   BASOSABS 0.0 05/22/2018 1148   BASOSABS WILL FOLLOW 08/01/2017 1031    BMET    Component Value Date/Time   NA 141 10/12/2021 1350   NA 140 01/09/2018 1535   K 4.5 10/12/2021 1350   CL 108 10/12/2021 1350   CO2 21 (L) 10/12/2021 1350   GLUCOSE 96 10/12/2021 1350   BUN 25 (H) 10/12/2021 1350   BUN 14 01/09/2018 1535   CREATININE 1.16 10/12/2021 1350   CALCIUM 10.2 10/12/2021 1350   GFRNONAA >60 10/12/2021 1350   GFRAA >60 05/22/2018 1148    BNP No results found for: "BNP"   Imaging:  DG Chest 2 View  Result Date: 05/31/2022 CLINICAL DATA:  COPD. EXAM: CHEST - 2 VIEW COMPARISON:  Chest two views  10/12/2021 and 08/31/2021; CT chest 03/10/2019 FINDINGS: Cardiac silhouette and mediastinal contours are within normal limits. There is flattening of the diaphragms and high-grade hyperinflation, as seen on prior radiographs and CT. Large right upper lobe bullae are better seen on prior CT. High-grade cystic central other emphysematous  changes. Unchanged thin linear scarring within the right lung, with portions related to the walls of the large bullae. No pleural effusion or pneumothorax. Mild multilevel degenerative disc changes of the thoracic spine. IMPRESSION: 1. No acute cardiopulmonary process. 2. Severe emphysema. Large right upper lobe bullae, better seen on prior CT. Electronically Signed   By: Yvonne Kendall M.D.   On: 05/31/2022 16:26         Latest Ref Rng & Units 04/06/2019   10:48 AM 07/14/2017    1:55 PM  PFT Results  FVC-Pre L 2.57  3.51   FVC-Predicted Pre % 56  76   FVC-Post L 3.89  4.08   FVC-Predicted Post % 85  88   Pre FEV1/FVC % % 57  41   Post FEV1/FCV % % 42  41   FEV1-Pre L 1.48  1.44   FEV1-Predicted Pre % 42  40   FEV1-Post L 1.62  1.69   DLCO uncorrected ml/min/mmHg 8.93  9.72   DLCO UNC% % 33  32   DLVA Predicted % 37  39   TLC L 7.04  7.09   TLC % Predicted % 107  107   RV % Predicted % 152  168     No results found for: "NITRICOXIDE"      Assessment & Plan:   COPD exacerbation (HCC) Recurrent AECOPD, likely related to discontinuing oral steroids. His cough has improved and recent CXR was without superimposed infection so will hold off on further abx therapy. Depo inj 80 mg x 1 today in office. We will treat him with prednisone taper and refill his daily 5 mg dose. Instructed him to let us know if he is close to running out in the future so we can ensure he has a refill sent for him. Continue triple therapy regimen. Strict ED precautions.   Patient Instructions  -Continue Albuterol inhaler 2 puffs or 3 mL neb every 6 hours as needed for shortness of  breath or wheezing. Notify if symptoms persist despite rescue inhaler/neb use. Use you albuterol nebs at least 2-4 times a day until symptoms improve -Continue Perforomist 2 mLs neb Twice daily -Continue pulmicort 1 vial neb Twice daily. Brush tongue and rinse mouth well afterwards -Continue Yupelri 3 mLs neb daily  -Continue supplemental oxygen 3-4 lpm for goal oxygen saturation >88-90%   Prednisone taper. 4 tabs for 3 days, then 3 tabs for 3 days, 2 tabs for 3 days, then 1 tab for 3 days, then return to 5 mg daily. Take in AM with food. Start tomorrow -Restart guaifenesin 600 mg Twice daily for chest congestion/cough -Flutter valve 2-3 times a day after nebulizer treatments   Drink plenty of fluids. If you are unable to keep food or liquids down, or if you are having levels 88% or less on 4 lpm supplemental O2, please go to the ED for further evaluation.    Follow up in 3 months with Dr. Halford Chessman or Alanson Aly. If symptoms do not improve or worsen, please contact office for sooner follow up or seek emergency care   Chronic respiratory failure with hypoxia ([place]) Stable without increased O2 requirement. Goal >88-90%  Diarrhea Likely viral given onset and lack of new medications/changes in diet. Encouraged him to push fluids and ensure he is remaining well hydrated. BRAT diet. If unable to keep fluids down or he develops worsening symptoms, advised to go to the ED.     I spent 35 minutes of dedicated to the  care of this patient on the date of this encounter to include pre-visit review of records, face-to-face time with the patient discussing conditions above, post visit ordering of testing, clinical documentation with the electronic health record, making appropriate referrals as documented, and communicating necessary findings to members of the patients care team.  Noemi Chapel, NP 06/21/2022  Pt aware and understands NP's role.

## 2022-06-21 NOTE — Assessment & Plan Note (Signed)
Recurrent AECOPD, likely related to discontinuing oral steroids. His cough has improved and recent CXR was without superimposed infection so will hold off on further abx therapy. Depo inj 80 mg x 1 today in office. We will treat him with prednisone taper and refill his daily 5 mg dose. Instructed him to let us know if he is close to running out in the future so we can ensure he has a refill sent for him. Continue triple therapy regimen. Strict ED precautions.   Patient Instructions  -Continue Albuterol inhaler 2 puffs or 3 mL neb every 6 hours as needed for shortness of breath or wheezing. Notify if symptoms persist despite rescue inhaler/neb use. Use you albuterol nebs at least 2-4 times a day until symptoms improve -Continue Perforomist 2 mLs neb Twice daily -Continue pulmicort 1 vial neb Twice daily. Brush tongue and rinse mouth well afterwards -Continue Yupelri 3 mLs neb daily  -Continue supplemental oxygen 3-4 lpm for goal oxygen saturation >88-90%   Prednisone taper. 4 tabs for 3 days, then 3 tabs for 3 days, 2 tabs for 3 days, then 1 tab for 3 days, then return to 5 mg daily. Take in AM with food. Start tomorrow -Restart guaifenesin 600 mg Twice daily for chest congestion/cough -Flutter valve 2-3 times a day after nebulizer treatments   Drink plenty of fluids. If you are unable to keep food or liquids down, or if you are having levels 88% or less on 4 lpm supplemental O2, please go to the ED for further evaluation.    Follow up in 3 months with Dr. Craige Cotta or Philis Nettle. If symptoms do not improve or worsen, please contact office for sooner follow up or seek emergency care

## 2022-06-21 NOTE — Patient Instructions (Signed)
-  Continue Albuterol inhaler 2 puffs or 3 mL neb every 6 hours as needed for shortness of breath or wheezing. Notify if symptoms persist despite rescue inhaler/neb use. Use you albuterol nebs at least 2-4 times a day until symptoms improve -Continue Perforomist 2 mLs neb Twice daily -Continue pulmicort 1 vial neb Twice daily. Brush tongue and rinse mouth well afterwards -Continue Yupelri 3 mLs neb daily  -Continue supplemental oxygen 3-4 lpm for goal oxygen saturation >88-90%   Prednisone taper. 4 tabs for 3 days, then 3 tabs for 3 days, 2 tabs for 3 days, then 1 tab for 3 days, then return to 5 mg daily. Take in AM with food. Start tomorrow -Restart guaifenesin 600 mg Twice daily for chest congestion/cough -Flutter valve 2-3 times a day after nebulizer treatments   Drink plenty of fluids. If you are unable to keep food or liquids down, or if you are having levels 88% or less on 4 lpm supplemental O2, please go to the ED for further evaluation.    Follow up in 3 months with Dr. Craige Cotta or Philis Nettle. If symptoms do not improve or worsen, please contact office for sooner follow up or seek emergency care

## 2022-06-25 ENCOUNTER — Other Ambulatory Visit: Payer: Self-pay

## 2022-06-25 ENCOUNTER — Ambulatory Visit: Payer: Medicaid Other | Admitting: Student

## 2022-06-25 ENCOUNTER — Encounter: Payer: Self-pay | Admitting: Student

## 2022-06-25 ENCOUNTER — Other Ambulatory Visit (HOSPITAL_COMMUNITY): Payer: Self-pay

## 2022-06-25 VITALS — BP 120/79 | HR 106 | Temp 98.0°F | Ht 68.0 in | Wt 122.2 lb

## 2022-06-25 DIAGNOSIS — Z23 Encounter for immunization: Secondary | ICD-10-CM | POA: Diagnosis not present

## 2022-06-25 DIAGNOSIS — K860 Alcohol-induced chronic pancreatitis: Secondary | ICD-10-CM

## 2022-06-25 DIAGNOSIS — F10929 Alcohol use, unspecified with intoxication, unspecified: Secondary | ICD-10-CM

## 2022-06-25 DIAGNOSIS — J439 Emphysema, unspecified: Secondary | ICD-10-CM

## 2022-06-25 DIAGNOSIS — Z7189 Other specified counseling: Secondary | ICD-10-CM | POA: Diagnosis not present

## 2022-06-25 DIAGNOSIS — Z87891 Personal history of nicotine dependence: Secondary | ICD-10-CM | POA: Diagnosis not present

## 2022-06-25 MED ORDER — TRAMADOL HCL 50 MG PO TABS
100.0000 mg | ORAL_TABLET | Freq: Four times a day (QID) | ORAL | 2 refills | Status: DC | PRN
  Filled 2022-06-25 – 2022-07-04 (×4): qty 240, 30d supply, fill #0
  Filled 2022-07-31: qty 240, 30d supply, fill #1
  Filled 2022-08-27: qty 240, 30d supply, fill #2

## 2022-06-25 NOTE — Patient Instructions (Signed)
Return 3 months for routine follow-up.  Please call our clinic at [phone] Monday through Friday from 9 am to 4 pm if you have questions or concerns about your health. If after hours or on the weekend, call the main hospital number and ask for the Internal Medicine Resident On-Call. If you need medication refills, please notify your pharmacy one week in advance and they will send [place] a request.   Best, Nani Gasser, [place]

## 2022-06-25 NOTE — Assessment & Plan Note (Signed)
Pain is steadily getting less responsive to his tramadol.  He feels like is working well enough to continue, and he appreciates that it allows him manage his pain without causing too much sedation.  No symptoms of serotonin syndrome with the adjunctive Cymbalta and amitriptyline.  His weight is stable.  I recommend increasing tramadol to 100 mg every 6 hours as needed for abdominal pain.  I refilled this medication today.

## 2022-06-25 NOTE — Progress Notes (Signed)
Reviewed and agree with assessment/plan.   Ardath Lepak, MD [facility] 06/25/2022, 8:06 AM Pager:  [phone]  

## 2022-06-25 NOTE — Assessment & Plan Note (Addendum)
Recognizes that his COPD is progressive and will likely be the ultimate cause of his eventual demise.  Appreciates efforts to prolong his life outside of the hospital.  For now, spirits are good and symptoms are under decent control.

## 2022-06-25 NOTE — Progress Notes (Signed)
Subjective:  [name] is a 62 y.o. who presents to clinic for routine follow-up.  His chief concern is that his chronic pancreatitis pain is no longer as well-controlled as it used to be on his 100 mg tramadol every 8 hours.  Since his last visit, he was able to get a carrier for his small, portable oxygen cylinder.  This improves his mobility and makes it easier to get from home due to his various doctors appointments.  His oxygen requirement is stable.  His weight is stable.  He continues to feel "as well as can be expected".  Past Medical History:  Diagnosis Date   Chronic pancreatitis ([place])    Depression    Duodenitis    by EGD 1/07   Emphysema    unclear diagnosis noted in previous EMR   History of alcohol abuse    Hyperlipidemia    Psoriasis    extensive, responds to strong steroids, unable to start molecular therapies due to cost   RUE weakness    2/2 cervical spondylosis   Stomach ulcer     Past Surgical History:  Procedure Laterality Date   ESOPHAGOGASTRODUODENOSCOPY (EGD) WITH PROPOFOL N/A 12/04/2016   Procedure: ESOPHAGOGASTRODUODENOSCOPY (EGD) WITH PROPOFOL;  Surgeon: Otis Brace, MD;  Location: [facility];  Service: Gastroenterology;  Laterality: N/A;   LAPAROSCOPIC CHOLECYSTECTOMY  11/28/2016   w/IOC   LAPAROSCOPIC CHOLECYSTECTOMY SINGLE SITE WITH INTRAOPERATIVE CHOLANGIOGRAM N/A 11/28/2016   Procedure: LAPAROSCOPIC CHOLECYSTECTOMY SINGLE SITE WITH INTRAOPERATIVE CHOLANGIOGRAM;  Surgeon:  Boston, MD;  Location: [facility];  Service: General;  Laterality: N/A;   LIVER BIOPSY N/A 11/28/2016   Procedure: NEEDLE CORE LIVER BIOPSY;  Surgeon:  Boston, MD;  Location: [facility];  Service: General;  Laterality: N/A;   TYMPANOSTOMY TUBE PLACEMENT Bilateral ~ 1970    Allergies  Allergen Reactions   Nsaids Other (See Comments)    Bleeding gastritis     Current Outpatient Medications on File Prior to Visit  Medication Sig Dispense Refill   albuterol  (PROVENTIL) (2.5 MG/3ML) 0.083% nebulizer solution Take 3 mLs by nebulization every 6 (six) hours as needed for wheezing or shortness of breath. 75 mL 6   albuterol (VENTOLIN HFA) 108 (90 Base) MCG/ACT inhaler Inhale 2 puffs into the lungs every 6 (six) hours as needed for wheezing or shortness of breath. 18 g 5   amitriptyline (ELAVIL) 50 MG tablet Take 1 tablet (50 mg total) by mouth at bedtime. 30 tablet 5   cefUROXime (CEFTIN) 500 MG tablet Take 1 tablet (500 mg total) by mouth 2 (two) times daily with a meal. 14 tablet 0   DULoxetine (CYMBALTA) 30 MG capsule Take 1 capsule (30 mg total) by mouth 2 (two) times daily. 180 capsule 3   formoterol (PERFOROMIST) 20 MCG/2ML nebulizer solution Take 2 mLs (20 mcg total) by nebulization 2 (two) times daily. 120 mL 5   hydrOXYzine (ATARAX) 25 MG tablet Take 1 tablet (25 mg total) by mouth 3 (three) times daily as needed. 100 tablet 2   LORazepam (ATIVAN) 0.5 MG tablet Take 1 tablet (0.5 mg total) by mouth 2 (two) times daily as needed for anxiety. 60 tablet 5   omeprazole (PRILOSEC) 40 MG capsule Take 1 capsule (40 mg total) by mouth daily. 30 capsule 3   ondansetron (ZOFRAN) 4 MG tablet Take 1 tablet (4 mg total) by mouth every 6 (six) hours. 12 tablet 0   predniSONE (DELTASONE) 10 MG tablet Take 4 tabs by mouth daily  for 3 days, then 3 tabs daily for 3 days, then 2 tabs daily for 3 days, then 1 tab daily for 3 days, then return to 5 mg daily. 30 tablet 0   predniSONE (DELTASONE) 5 MG tablet Take 1 tablet (5 mg total) by mouth daily with breakfast. 30 tablet 5   PULMICORT 0.5 MG/2ML nebulizer solution Inhale 2 mLs (0.5 mg total) into the lungs 2 (two) times daily. 120 mL 11   revefenacin (YUPELRI) 175 MCG/3ML nebulizer solution Take 3 mLs (175 mcg total) by nebulization daily. 90 mL 3   [DISCONTINUED] sertraline (ZOLOFT) 50 MG tablet Take 1 tablet (50 mg total) by mouth daily. 90 tablet 2   No current facility-administered medications on file prior to  visit.    Family History  Problem Relation Age of Onset   Diabetes Mother    Heart disease Father    Cancer Father        ?kidney or liver cancer    Social History   Socioeconomic History   Marital status: Divorced    Spouse name: Not on file   Number of children: Not on file   Years of education: Not on file   Highest education level: Not on file  Occupational History   Not on file  Tobacco Use   Smoking status: Former    Packs/day: 0.10    Years: 40.00    Total pack years: 4.00    Types: Cigarettes    Quit date: 05/19/2018    Years since quitting: 4.1   Smokeless tobacco: Never   Tobacco comments:    4-5 cigs per day   Vaping Use   Vaping Use: Never used  Substance and Sexual Activity   Alcohol use: Not Currently    Comment: quit x 10 yrs.   Drug use: Not Currently    Types: Marijuana    Comment: quit in high school   Sexual activity: Not on file    Comment: trying for disability to gain medicare coverage, has pain contract and warned that if tested positive again for cocaine will not be given narcotics  Other Topics Concern   Not on file  Social History Narrative   Financial assistance approved for 100% discount at [facility] and has [facility] card   [employer]  March 08, 2010 2:17 PM   Social Determinants of Health   Financial Resource Strain: Not on file  Food Insecurity: Not on file  Transportation Needs: Not on file  Physical Activity: Not on file  Stress: Not on file  Social Connections: Not on file  Intimate Partner Violence: Not on file    Objective:   Vitals:   06/25/22 1506  BP: 120/79  Pulse: (!) 106  Temp: 98 F (36.7 C)  TempSrc: Oral  SpO2: 96%  Weight: 122 lb 3.2 oz (55.4 kg)  Height: 5\' 8"  (1.727 m)    male with oxygen via nasal cannula in no apparent distress Heart rate and rhythm are regular, no murmurs, radial pulses 2+ bilaterally, no lower extremity edema Breathing is regular and unlabored, sentences are  short and clipped, lung sounds are decreased throughout Mild epigastric tenderness to palpation Skin is pale, warm, and dry Alert and oriented, no gross neurologic deficits Pleasant, mood and affect are concordant  Assessment & Plan:  The primary encounter diagnosis was Need for immunization against influenza. Diagnoses of Alcohol-induced chronic pancreatitis (HCC), Pulmonary emphysema, unspecified emphysema type (HCC), Chronic pancreatitis due to acute alcohol intoxication (HCC), and Goals  of care, counseling/discussion were also pertinent to this visit.  COPD with emphysema ([place]) Oxygen requirements are stable.  Continues to suffer frequent exacerbations but has managed to avoid hospitalization.  Now on daily low-dose prednisone per pulmonology.  No recommended changes to management.  Chronic pancreatitis due to acute alcohol intoxication (HCC) Pain is steadily getting less responsive to his tramadol.  He feels like is working well enough to continue, and he appreciates that it allows him manage his pain without causing too much sedation.  No symptoms of serotonin syndrome with the adjunctive Cymbalta and amitriptyline.  His weight is stable.  I recommend increasing tramadol to 100 mg every 6 hours as needed for abdominal pain.  I refilled this medication today.  Goals of care, counseling/discussion Recognizes that his COPD is progressive and will likely be the ultimate cause of his eventual demise.  Appreciates efforts to prolong his life outside of the hospital.  For now, spirits are good and symptoms are under decent control.    Return 3 months for routine follow-up.  Patient discussed with Dr. Archie Endo MD 06/25/2022, 5:51 PM  Pager: [phone]

## 2022-06-25 NOTE — Assessment & Plan Note (Signed)
Oxygen requirements are stable.  Continues to suffer frequent exacerbations but has managed to avoid hospitalization.  Now on daily low-dose prednisone per pulmonology.  No recommended changes to management.

## 2022-06-26 ENCOUNTER — Other Ambulatory Visit (HOSPITAL_COMMUNITY): Payer: Self-pay

## 2022-06-27 NOTE — Progress Notes (Signed)
Internal Medicine Clinic Attending  Case discussed with the resident at the time of the visit.  We reviewed the resident's history and exam and pertinent patient test results.  I agree with the assessment, diagnosis, and plan of care documented in the resident's note.  

## 2022-07-01 ENCOUNTER — Other Ambulatory Visit: Payer: Self-pay

## 2022-07-01 ENCOUNTER — Other Ambulatory Visit: Payer: Self-pay | Admitting: Pulmonary Disease

## 2022-07-01 ENCOUNTER — Other Ambulatory Visit (HOSPITAL_COMMUNITY): Payer: Self-pay

## 2022-07-01 MED ORDER — FORMOTEROL FUMARATE 20 MCG/2ML IN NEBU
2.0000 mL | INHALATION_SOLUTION | Freq: Two times a day (BID) | RESPIRATORY_TRACT | 5 refills | Status: DC
  Filled 2022-07-01: qty 120, 30d supply, fill #0
  Filled 2022-08-04: qty 120, 30d supply, fill #1
  Filled 2022-09-02 (×2): qty 120, 30d supply, fill #2
  Filled 2022-10-02: qty 120, 30d supply, fill #3

## 2022-07-02 ENCOUNTER — Other Ambulatory Visit (HOSPITAL_COMMUNITY): Payer: Self-pay

## 2022-07-03 ENCOUNTER — Other Ambulatory Visit (HOSPITAL_COMMUNITY): Payer: Self-pay

## 2022-07-04 ENCOUNTER — Other Ambulatory Visit (HOSPITAL_COMMUNITY): Payer: Self-pay

## 2022-07-09 ENCOUNTER — Other Ambulatory Visit: Payer: Self-pay | Admitting: Pulmonary Disease

## 2022-07-09 ENCOUNTER — Other Ambulatory Visit: Payer: Self-pay | Admitting: Student

## 2022-07-09 ENCOUNTER — Other Ambulatory Visit (HOSPITAL_COMMUNITY): Payer: Self-pay

## 2022-07-09 MED ORDER — YUPELRI 175 MCG/3ML IN SOLN
175.0000 ug | Freq: Every day | RESPIRATORY_TRACT | 3 refills | Status: DC
  Filled 2022-07-09: qty 90, 30d supply, fill #0
  Filled 2022-08-04: qty 90, 30d supply, fill #1
  Filled 2022-09-02: qty 90, 30d supply, fill #2
  Filled 2022-10-02: qty 90, 30d supply, fill #3

## 2022-07-10 ENCOUNTER — Other Ambulatory Visit (HOSPITAL_COMMUNITY): Payer: Self-pay

## 2022-07-12 ENCOUNTER — Other Ambulatory Visit (HOSPITAL_COMMUNITY): Payer: Self-pay

## 2022-07-12 MED ORDER — OMEPRAZOLE 40 MG PO CPDR
40.0000 mg | DELAYED_RELEASE_CAPSULE | Freq: Every day | ORAL | 3 refills | Status: DC
  Filled 2022-07-12: qty 30, 30d supply, fill #0
  Filled 2022-08-08: qty 30, 30d supply, fill #1
  Filled 2022-09-10: qty 30, 30d supply, fill #2
  Filled 2022-10-14: qty 30, 30d supply, fill #3

## 2022-07-20 DIAGNOSIS — J9611 Chronic respiratory failure with hypoxia: Secondary | ICD-10-CM | POA: Diagnosis not present

## 2022-07-20 DIAGNOSIS — J432 Centrilobular emphysema: Secondary | ICD-10-CM | POA: Diagnosis not present

## 2022-07-22 ENCOUNTER — Other Ambulatory Visit: Payer: Self-pay

## 2022-07-22 ENCOUNTER — Other Ambulatory Visit (HOSPITAL_COMMUNITY): Payer: Self-pay

## 2022-07-29 ENCOUNTER — Other Ambulatory Visit (HOSPITAL_COMMUNITY): Payer: Self-pay

## 2022-08-01 ENCOUNTER — Other Ambulatory Visit: Payer: Self-pay

## 2022-08-01 ENCOUNTER — Other Ambulatory Visit (HOSPITAL_COMMUNITY): Payer: Self-pay

## 2022-08-05 ENCOUNTER — Other Ambulatory Visit: Payer: Self-pay

## 2022-08-05 ENCOUNTER — Other Ambulatory Visit (HOSPITAL_COMMUNITY): Payer: Self-pay

## 2022-08-07 ENCOUNTER — Other Ambulatory Visit (HOSPITAL_COMMUNITY): Payer: Self-pay

## 2022-08-08 ENCOUNTER — Other Ambulatory Visit: Payer: Self-pay | Admitting: Pulmonary Disease

## 2022-08-09 ENCOUNTER — Other Ambulatory Visit (HOSPITAL_COMMUNITY): Payer: Self-pay

## 2022-08-09 ENCOUNTER — Other Ambulatory Visit: Payer: Self-pay

## 2022-08-09 MED ORDER — ALBUTEROL SULFATE (2.5 MG/3ML) 0.083% IN NEBU
3.0000 mL | INHALATION_SOLUTION | Freq: Four times a day (QID) | RESPIRATORY_TRACT | 6 refills | Status: DC | PRN
  Filled 2022-08-09: qty 75, 7d supply, fill #0
  Filled 2022-09-02: qty 75, 7d supply, fill #1

## 2022-08-12 ENCOUNTER — Other Ambulatory Visit (HOSPITAL_COMMUNITY): Payer: Self-pay

## 2022-08-19 ENCOUNTER — Other Ambulatory Visit: Payer: Self-pay

## 2022-08-19 ENCOUNTER — Other Ambulatory Visit (HOSPITAL_COMMUNITY): Payer: Self-pay

## 2022-08-20 ENCOUNTER — Other Ambulatory Visit (HOSPITAL_COMMUNITY): Payer: Self-pay

## 2022-08-20 DIAGNOSIS — J432 Centrilobular emphysema: Secondary | ICD-10-CM | POA: Diagnosis not present

## 2022-08-20 DIAGNOSIS — J9611 Chronic respiratory failure with hypoxia: Secondary | ICD-10-CM | POA: Diagnosis not present

## 2022-08-21 ENCOUNTER — Other Ambulatory Visit (HOSPITAL_COMMUNITY): Payer: Self-pay

## 2022-08-23 ENCOUNTER — Encounter: Payer: Self-pay | Admitting: *Deleted

## 2022-08-28 ENCOUNTER — Other Ambulatory Visit: Payer: Self-pay

## 2022-08-28 ENCOUNTER — Other Ambulatory Visit (HOSPITAL_COMMUNITY): Payer: Self-pay

## 2022-09-02 ENCOUNTER — Other Ambulatory Visit (HOSPITAL_COMMUNITY): Payer: Self-pay

## 2022-09-02 ENCOUNTER — Other Ambulatory Visit: Payer: Self-pay

## 2022-09-02 ENCOUNTER — Other Ambulatory Visit: Payer: Self-pay | Admitting: Pulmonary Disease

## 2022-09-02 MED ORDER — ALBUTEROL SULFATE (2.5 MG/3ML) 0.083% IN NEBU
3.0000 mL | INHALATION_SOLUTION | Freq: Four times a day (QID) | RESPIRATORY_TRACT | 6 refills | Status: DC | PRN
  Filled 2022-09-02: qty 75, 7d supply, fill #0
  Filled 2022-09-02: qty 90, 8d supply, fill #0
  Filled 2022-10-02: qty 90, 8d supply, fill #1

## 2022-09-03 ENCOUNTER — Other Ambulatory Visit: Payer: Self-pay

## 2022-09-05 ENCOUNTER — Other Ambulatory Visit (HOSPITAL_COMMUNITY): Payer: Self-pay

## 2022-09-10 ENCOUNTER — Other Ambulatory Visit: Payer: Self-pay

## 2022-09-10 DIAGNOSIS — Z87891 Personal history of nicotine dependence: Secondary | ICD-10-CM

## 2022-09-10 DIAGNOSIS — Z122 Encounter for screening for malignant neoplasm of respiratory organs: Secondary | ICD-10-CM

## 2022-09-16 ENCOUNTER — Other Ambulatory Visit (HOSPITAL_COMMUNITY): Payer: Self-pay

## 2022-09-18 DIAGNOSIS — J9611 Chronic respiratory failure with hypoxia: Secondary | ICD-10-CM | POA: Diagnosis not present

## 2022-09-18 DIAGNOSIS — J432 Centrilobular emphysema: Secondary | ICD-10-CM | POA: Diagnosis not present

## 2022-09-20 ENCOUNTER — Ambulatory Visit: Payer: Medicaid Other | Admitting: Nurse Practitioner

## 2022-09-24 ENCOUNTER — Other Ambulatory Visit (HOSPITAL_COMMUNITY): Payer: Self-pay

## 2022-09-24 ENCOUNTER — Other Ambulatory Visit: Payer: Self-pay

## 2022-09-24 MED ORDER — TRAMADOL HCL 50 MG PO TABS
100.0000 mg | ORAL_TABLET | Freq: Four times a day (QID) | ORAL | 2 refills | Status: DC | PRN
  Filled 2022-09-24 – 2022-09-26 (×2): qty 240, 30d supply, fill #0
  Filled 2022-10-22 – 2022-10-25 (×2): qty 240, 30d supply, fill #1

## 2022-09-24 NOTE — Telephone Encounter (Signed)
PDMP reviewed. Refill appropriate. 

## 2022-09-25 ENCOUNTER — Other Ambulatory Visit: Payer: Self-pay

## 2022-09-26 ENCOUNTER — Other Ambulatory Visit (HOSPITAL_COMMUNITY): Payer: Self-pay

## 2022-10-02 ENCOUNTER — Other Ambulatory Visit: Payer: Self-pay

## 2022-10-02 ENCOUNTER — Other Ambulatory Visit (HOSPITAL_COMMUNITY): Payer: Self-pay

## 2022-10-02 ENCOUNTER — Other Ambulatory Visit: Payer: Self-pay | Admitting: Pulmonary Disease

## 2022-10-03 ENCOUNTER — Other Ambulatory Visit: Payer: Self-pay | Admitting: Pulmonary Disease

## 2022-10-03 ENCOUNTER — Other Ambulatory Visit: Payer: Self-pay

## 2022-10-04 ENCOUNTER — Other Ambulatory Visit (HOSPITAL_COMMUNITY): Payer: Self-pay

## 2022-10-04 MED ORDER — ALBUTEROL SULFATE HFA 108 (90 BASE) MCG/ACT IN AERS
2.0000 | INHALATION_SPRAY | Freq: Four times a day (QID) | RESPIRATORY_TRACT | 5 refills | Status: DC | PRN
  Filled 2022-10-04 – 2022-10-11 (×2): qty 18, 25d supply, fill #0

## 2022-10-07 ENCOUNTER — Other Ambulatory Visit: Payer: Self-pay

## 2022-10-08 ENCOUNTER — Encounter: Payer: Self-pay | Admitting: Acute Care

## 2022-10-08 ENCOUNTER — Ambulatory Visit (INDEPENDENT_AMBULATORY_CARE_PROVIDER_SITE_OTHER): Payer: Medicaid Other | Admitting: Acute Care

## 2022-10-08 DIAGNOSIS — Z87891 Personal history of nicotine dependence: Secondary | ICD-10-CM | POA: Diagnosis not present

## 2022-10-08 NOTE — Patient Instructions (Signed)
Thank you for participating in the [facility]. It was our pleasure to meet you today. We will call you with the results of your scan within the next few days. Your scan will be assigned a Lung RADS category score by the physicians reading the scans.  This Lung RADS score determines follow up scanning.  See below for description of categories, and follow up screening recommendations. We will be in touch to schedule your follow up screening annually or based on recommendations of our providers. We will fax a copy of your scan results to your Primary Care Physician, or the physician who referred you to the program, to ensure they have the results. Please call the office if you have any questions or concerns regarding your scanning experience or results.  Our office number is [phone]. Please speak with Denise Phelps, RN. , or  Denise Buckner RN, They are  our Lung Cancer Screening RN.'s If They are unavailable when you call, Please leave a message on the voice mail. We will return your call at our earliest convenience.This voice mail is monitored several times a day.  Remember, if your scan is normal, we will scan you annually as long as you continue to meet the criteria for the program. (Age 50-80, Current smoker or smoker who has quit within the last 15 years). If you are a smoker, remember, quitting is the single most powerful action that you can take to decrease your risk of lung cancer and other pulmonary, breathing related problems. We know quitting is hard, and we are here to help.  Please let us know if there is anything we can do to help you meet your goal of quitting. If you are a former smoker, congratulations. We are proud of you! Remain smoke free! Remember you can refer friends or family members through the number above.  We will screen them to make sure they meet criteria for the program. Thank you for helping us take better care of you by  participating in Lung Screening.  You can receive free nicotine replacement therapy ( patches, gum or mints) by calling 1-800-QUIT NOW. Please call so we can get you on the path to becoming  a non-smoker. I know it is hard, but you can do this!  Lung RADS Categories:  Lung RADS 1: no nodules or definitely non-concerning nodules.  Recommendation is for a repeat annual scan in 12 months.  Lung RADS 2:  nodules that are non-concerning in appearance and behavior with a very low likelihood of becoming an active cancer. Recommendation is for a repeat annual scan in 12 months.  Lung RADS 3: nodules that are probably non-concerning , includes nodules with a low likelihood of becoming an active cancer.  Recommendation is for a 6-month repeat screening scan. Often noted after an upper respiratory illness. We will be in touch to make sure you have no questions, and to schedule your 6-month scan.  Lung RADS 4 A: nodules with concerning findings, recommendation is most often for a follow up scan in 3 months or additional testing based on our provider's assessment of the scan. We will be in touch to make sure you have no questions and to schedule the recommended 3 month follow up scan.  Lung RADS 4 B:  indicates findings that are concerning. We will be in touch with you to schedule additional diagnostic testing based on our provider's  assessment of the scan.  Other options for assistance in smoking cessation (   As covered by your insurance benefits)  Hypnosis for smoking cessation  [employer] [phone]  Acupuncture for smoking cessation  [facility] [phone]   

## 2022-10-08 NOTE — Progress Notes (Signed)
Virtual Visit via Telephone Note  I connected with [name] on 10/08/22 at  3:00 PM EDT by telephone and verified that I am speaking with the correct person using two identifiers.  Location: Patient:  At home Provider:  [address] [place], [place], [address]    I discussed the limitations, risks, security and privacy concerns of performing an evaluation and management service by telephone and the availability of in person appointments. I also discussed with the patient that there may be a patient responsible charge related to this service. The patient expressed understanding and agreed to proceed.   Shared Decision Making Visit Lung Cancer Screening Program [phone])   Eligibility: Age 62 y.o. Pack Years Smoking History Calculation 65 pack year smoking history (# packs/per year x # years smoked) Recent History of coughing up blood  no Unexplained weight loss? no ( >Than 15 pounds within the last 6 months ) Prior History Lung / other cancer no (Diagnosis within the last 5 years already requiring surveillance chest CT Scans). Smoking Status Former Smoker Former Smokers: Years since quit: 5 years  Quit Date: 05/19/2018  Visit Components: Discussion included one or more decision making aids. yes Discussion included risk/benefits of screening. yes Discussion included potential follow up diagnostic testing for abnormal scans. yes Discussion included meaning and risk of over diagnosis. yes Discussion included meaning and risk of False Positives. yes Discussion included meaning of total radiation exposure. yes  Counseling Included: Importance of adherence to annual lung cancer LDCT screening. yes Impact of comorbidities on ability to participate in the program. yes Ability and willingness to under diagnostic treatment. yes  Smoking Cessation Counseling: Current Smokers:  Discussed importance of smoking cessation. yes Information about tobacco cessation classes and  interventions provided to patient. yes Patient provided with "ticket" for LDCT Scan. yes Symptomatic Patient. no  Counseling NA Diagnosis Code: Tobacco Use Z72.0 Asymptomatic Patient yes  Counseling (Intermediate counseling: > three minutes counseling) U0454 Former Smokers:  Discussed the importance of maintaining cigarette abstinence. yes Diagnosis Code: Personal History of Nicotine Dependence. U98.119 Information about tobacco cessation classes and interventions provided to patient. Yes Patient provided with "ticket" for LDCT Scan. yes Written Order for Lung Cancer Screening with LDCT placed in Epic. Yes (CT Chest Lung Cancer Screening Low Dose W/O CM) [id number] Z12.2-Screening of respiratory organs Z87.891-Personal history of nicotine dependence  I spent 25 minutes of face to face time/virtual visit time  with  [name] discussing the risks and benefits of lung cancer screening. We took the time to pause the power point at intervals to allow for questions to be asked and answered to ensure understanding. We discussed that he had taken the single most powerful action possible to decrease his risk of developing lung cancer when he quit smoking. I counseled him to remain smoke free, and to contact me if he ever had the desire to smoke again so that I can provide resources and tools to help support the effort to remain smoke free. We discussed the time and location of the scan, and that either  Samuel Miyamoto RN, Karlton Lemon, RN or I  or I will call / send a letter with the results within  24-72 hours of receiving them. He has the office contact information in the event he needs to speak with me,  he verbalized understanding of all of the above and had no further questions upon leaving the office.     I explained to the patient that there  has been a high incidence of coronary artery disease noted on these exams. I explained that this is a non-gated exam therefore degree or severity cannot be  determined. This patient is not on statin therapy. I have asked the patient to follow-up with their PCP regarding any incidental finding of coronary artery disease and management with diet or medication as they feel is clinically indicated. The patient verbalized understanding of the above and had no further questions.     Bevelyn Ngo, NP 10/08/2022

## 2022-10-09 ENCOUNTER — Other Ambulatory Visit (HOSPITAL_COMMUNITY): Payer: Self-pay

## 2022-10-10 ENCOUNTER — Ambulatory Visit
Admission: RE | Admit: 2022-10-10 | Discharge: 2022-10-10 | Disposition: A | Discharge status: Home / Self Care | Payer: Medicaid Other | Source: Ambulatory Visit | Attending: Acute Care | Admitting: Acute Care

## 2022-10-10 DIAGNOSIS — Z87891 Personal history of nicotine dependence: Secondary | ICD-10-CM

## 2022-10-10 DIAGNOSIS — Z122 Encounter for screening for malignant neoplasm of respiratory organs: Secondary | ICD-10-CM

## 2022-10-11 ENCOUNTER — Other Ambulatory Visit (HOSPITAL_COMMUNITY): Payer: Self-pay

## 2022-10-15 ENCOUNTER — Other Ambulatory Visit (HOSPITAL_COMMUNITY): Payer: Self-pay

## 2022-10-15 ENCOUNTER — Ambulatory Visit (INDEPENDENT_AMBULATORY_CARE_PROVIDER_SITE_OTHER): Payer: Medicaid Other | Admitting: Nurse Practitioner

## 2022-10-15 ENCOUNTER — Encounter: Payer: Self-pay | Admitting: Nurse Practitioner

## 2022-10-15 ENCOUNTER — Other Ambulatory Visit: Payer: Self-pay

## 2022-10-15 VITALS — BP 108/58 | HR 104 | Ht 68.0 in | Wt 123.0 lb

## 2022-10-15 DIAGNOSIS — Z7189 Other specified counseling: Secondary | ICD-10-CM | POA: Diagnosis not present

## 2022-10-15 DIAGNOSIS — R911 Solitary pulmonary nodule: Secondary | ICD-10-CM

## 2022-10-15 DIAGNOSIS — J439 Emphysema, unspecified: Secondary | ICD-10-CM

## 2022-10-15 DIAGNOSIS — J4489 Other specified chronic obstructive pulmonary disease: Secondary | ICD-10-CM | POA: Diagnosis not present

## 2022-10-15 DIAGNOSIS — J9611 Chronic respiratory failure with hypoxia: Secondary | ICD-10-CM

## 2022-10-15 DIAGNOSIS — F419 Anxiety disorder, unspecified: Secondary | ICD-10-CM

## 2022-10-15 DIAGNOSIS — J441 Chronic obstructive pulmonary disease with (acute) exacerbation: Secondary | ICD-10-CM

## 2022-10-15 DIAGNOSIS — Z87891 Personal history of nicotine dependence: Secondary | ICD-10-CM

## 2022-10-15 DIAGNOSIS — Z122 Encounter for screening for malignant neoplasm of respiratory organs: Secondary | ICD-10-CM

## 2022-10-15 DIAGNOSIS — Z720 Tobacco use: Secondary | ICD-10-CM | POA: Diagnosis not present

## 2022-10-15 DIAGNOSIS — Z5181 Encounter for therapeutic drug level monitoring: Secondary | ICD-10-CM | POA: Diagnosis not present

## 2022-10-15 LAB — COMPREHENSIVE METABOLIC PANEL
ALT: 11 U/L (ref 0–53)
AST: 20 U/L (ref 0–37)
Albumin: 4.2 g/dL (ref 3.5–5.2)
Alkaline Phosphatase: 74 U/L (ref 39–117)
BUN: 19 mg/dL (ref 6–23)
CO2: 24 mEq/L (ref 19–32)
Calcium: 9.7 mg/dL (ref 8.4–10.5)
Chloride: 105 mEq/L (ref 96–112)
Creatinine, Ser: 1.02 mg/dL (ref 0.40–1.50)
GFR: 80.06 mL/min (ref >60.00)
Glucose, Bld: 80 mg/dL (ref 70–99)
Potassium: 4 mEq/L (ref 3.5–5.1)
Sodium: 141 mEq/L (ref 135–145)
Total Bilirubin: 0.2 mg/dL (ref 0.2–1.2)
Total Protein: 6.9 g/dL (ref 6.0–8.3)

## 2022-10-15 MED ORDER — LORAZEPAM 0.5 MG PO TABS
0.5000 mg | ORAL_TABLET | Freq: Two times a day (BID) | ORAL | 0 refills | Status: DC | PRN
Start: 2022-10-25 — End: 2023-11-05
  Filled 2022-10-15 – 2022-10-25 (×3): qty 14, 7d supply, fill #0

## 2022-10-15 MED ORDER — DOXYCYCLINE HYCLATE 100 MG PO TABS
100.0000 mg | ORAL_TABLET | Freq: Two times a day (BID) | ORAL | 0 refills | Status: AC
Start: 2022-10-15 — End: 2022-10-22
  Filled 2022-10-15: qty 14, 7d supply, fill #0

## 2022-10-15 MED ORDER — PREDNISONE 10 MG PO TABS
ORAL_TABLET | ORAL | 0 refills | Status: AC
Start: 2022-10-15 — End: 2022-10-27
  Filled 2022-10-15: qty 30, 12d supply, fill #0

## 2022-10-15 NOTE — Progress Notes (Signed)
@Patient  ID: [name], male    DOB: December 08, 1960, 62 y.o.   MRN: [id number]  Chief Complaint  Patient presents with   Follow-up    Pt states his breathing has become worse since last visit.    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  He is a patient of Dr. Evlyn Courier and last seen in office on 06/21/2022 by [facility] NP.  Past medical history significant for chronic pancreatitis, chronic cholecystitis, psoriasis on methotrexate, HLD, depression.  TEST/EVENTS:  04/06/2019 PFTs: FVC 85%, FEV1 46%, ratio 57, TLC 107%, DLCOunc 33%.  Severe obstructive airway disease with increased lung volumes and severe diffusion defect.  No BD 08/31/2021 CXR 2 view: Hyperinflation with emphysema and bronchitic changes.  There was scarring at the lingula, consistent with previous infection.  No acute airspace disease present. 09/28/2021 CXR 2 view: Hyperinflated lungs.  Mild streaky opacities in the anterior lower lobes bilaterally, unchanged and likely represent scarring or atelectasis. 10/10/2022 LDCT chest: Atherosclerosis.  No LAD.  Severe bullous emphysema in the right upper lobe and severe centrilobular emphysema with diffuse bronchial wall thickening throughout.  No acute airspace disease.  Previously visualized pulmonary nodules are either resolved or stable.  Lung RADS 2  02/18/2022: OV with Dr. Craige Cotta. Feels as though breathing is getting worse. Now requiring 4-5 lpm supplemental O2. Cough with yellow sputum. Enrolled with palliative care, which has been helping. He is maintained on triple therapy regimen and low dose daily steroids. Treated with course of cefuroxime and prednisone.   05/31/2022: OV with Krosby Ritchie NP for acute visit due to increased shortness of breath and productive cough.  He is also here because his nebulizer machine has intermittently  stopped working on him.  He contacted the medical supply company to get a new one who told him that he would need a new order.  This was placed back in October.  He is yet to receive this.  He was told he was going to need a face-to-face visit to get a new machine, even though he just had a visit in August and it has been less than 6 months since he was seen last.  Tells me that his symptoms started over the last few weeks.  He is producing yellow sputum.  Has also noticed some more wheezing.  Feeling a little more fatigued than normal.  Eating and drinking well.  He did feel like he might have had a viral illness over the weekend but the symptoms resolved.  Had some chills.  Never checked his temperature so unsure if he actually had a fever.  Denies any hemoptysis, lower extremity swelling, orthopnea, URI symptoms.  No increased oxygen requirement.  He is currently on 4 L supplemental O2.  CXR without superimposed infection. Treated for AECOPD with empiric doxycycline and prednisone taper.   06/21/2022: OV with Yanice Maqueda NP for intended follow-up after being treated for AECOPD.  He was feeling better after his last course of steroids and antibiotics.  Cough has cleared up; still producing some occasional phlegm that is white.  Unfortunately over the last couple days, his breathing has gotten worse again.  He did come off of his steroids entirely.  Did not think he had any refills on this.  He has noticed some more wheezing.  Has not had any low oxygen levels.  He is also having frequent episodes of  diarrhea.  Concerned that he may have a GI bug.  Denies any fevers, chills, body aches, nausea, vomiting.  Able to keep food and liquids down.  Eating and drinking okay.  He is using his triple therapy nebulizers.  He did get his new nebulizer machine since he was here last.  Using his albuterol neb twice daily.  No increased oxygen requirements.  10/15/2022: Today - follow up Patient presents today for intended  follow-up.  Unfortunately over the last 3 weeks or so, he has noticed that he has been having more trouble with his breathing.  He is also developed a productive cough with green phlegm.  He has been wheezing more and has a lot of chest congestion.  He did have a CT scan of the chest on 4/18 without any acute process.  He denies any fevers, chills, hemoptysis, lower extremity swelling, orthopnea.  He was being followed by palliative care and last seen in November 2023.  The previous provider he was seeing apparently has left the organization and he has not seen anyone since.  She was managing his anxiety with Ativan 0.5 mg twice daily.  He feels like he continues to need this.  Has a lot of anxiety, especially surrounding his breathing.  Has not seen his PCP regarding this.  Denies any SI/HI.  No aberrant behavior.   Allergies  Allergen Reactions   Nsaids Other (See Comments)    Bleeding gastritis    Immunization History  Administered Date(s) Administered   Influenza Whole 02/20/2010   Influenza,inj,Quad PF,6+ Mos 04/01/2017, 06/09/2018, 03/18/2019, 03/27/2020, 06/25/2022   Influenza-Unspecified 03/28/2017, 05/07/2021   PFIZER(Purple Top)SARS-COV-2 Vaccination 09/17/2019, 10/21/2019, 05/31/2020   Tdap 07/27/2009, 05/07/2021    Past Medical History:  Diagnosis Date   Chronic pancreatitis    Depression    Duodenitis    by EGD 1/07   Emphysema    unclear diagnosis noted in previous EMR   History of alcohol abuse    Hyperlipidemia    Psoriasis    extensive, responds to strong steroids, unable to start molecular therapies due to cost   RUE weakness    2/2 cervical spondylosis   Stomach ulcer     Tobacco History: Social History   Tobacco Use  Smoking Status Former   Packs/day: 0.10   Years: 40.00   Additional pack years: 0.00   Total pack years: 4.00   Types: Cigarettes   Quit date: 05/19/2018   Years since quitting: 4.4  Smokeless Tobacco Never  Tobacco Comments   4-5 cigs  per day    Counseling given: Not Answered Tobacco comments: 4-5 cigs per day    Outpatient Medications Prior to Visit  Medication Sig Dispense Refill   albuterol (PROVENTIL) (2.5 MG/3ML) 0.083% nebulizer solution Take 3 mLs by nebulization every 6 (six) hours as needed for wheezing or shortness of breath. 75 mL 6   albuterol (VENTOLIN HFA) 108 (90 Base) MCG/ACT inhaler Inhale 2 puffs into the lungs every 6 (six) hours as needed for wheezing or shortness of breath. 18 g 5   amitriptyline (ELAVIL) 50 MG tablet Take 1 tablet (50 mg total) by mouth at bedtime. 30 tablet 5   cefUROXime (CEFTIN) 500 MG tablet Take 1 tablet (500 mg total) by mouth 2 (two) times daily with a meal. 14 tablet 0   DULoxetine (CYMBALTA) 30 MG capsule Take 1 capsule (30 mg total) by mouth 2 (two) times daily. 180 capsule 3   formoterol (PERFOROMIST) 20 MCG/2ML nebulizer solution Take  2 mLs (20 mcg total) by nebulization 2 (two) times daily. 120 mL 5   hydrOXYzine (ATARAX) 25 MG tablet Take 1 tablet (25 mg total) by mouth 3 (three) times daily as needed. 100 tablet 2   omeprazole (PRILOSEC) 40 MG capsule Take 1 capsule (40 mg total) by mouth daily. 30 capsule 3   ondansetron (ZOFRAN) 4 MG tablet Take 1 tablet (4 mg total) by mouth every 6 (six) hours. 12 tablet 0   predniSONE (DELTASONE) 5 MG tablet Take 1 tablet (5 mg total) by mouth daily with breakfast. 30 tablet 5   PULMICORT 0.5 MG/2ML nebulizer solution Inhale 2 mLs (0.5 mg total) into the lungs 2 (two) times daily. 120 mL 11   revefenacin (YUPELRI) 175 MCG/3ML nebulizer solution Take 3 mLs (175 mcg total) by nebulization daily. 90 mL 3   traMADol (ULTRAM) 50 MG tablet Take 2 tablets (100 mg total) by mouth every 6 (six) hours as needed. 240 tablet 2   LORazepam (ATIVAN) 0.5 MG tablet Take 1 tablet (0.5 mg total) by mouth 2 (two) times daily as needed for anxiety. 60 tablet 5   predniSONE (DELTASONE) 10 MG tablet Take 4 tabs by mouth daily for 3 days, then 3 tabs daily  for 3 days, then 2 tabs daily for 3 days, then 1 tab daily for 3 days, then return to 5 mg daily. 30 tablet 0   No facility-administered medications prior to visit.     Review of Systems:   Constitutional: No weight loss or gain, night sweats, fevers, chills. +fatigue HEENT: No headaches, difficulty swallowing, tooth/dental problems, or sore throat. No sneezing, itching, ear ache, nasal congestion, or post nasal drip CV:  No chest pain, orthopnea, PND, swelling in lower extremities, anasarca, dizziness, palpitations, syncope Resp: +shortness of breath with exertion; productive cough; wheeze. No hemoptysis. No chest wall deformity GI:  No heartburn, indigestion, abdominal pain, nausea, vomiting, diarrhea, change in bowel habits, loss of appetite, bloody stools.  Skin: No rash, lesions, ulcerations MSK:  No joint pain or swelling.  Neuro: No dizziness or lightheadedness.  Psych: No depression. +anxiety. No SI/HI. Mood stable.     Physical Exam:  BP (!) 108/58 (BP Location: Right Arm, Patient Position: Sitting, Cuff Size: Normal)   Pulse (!) 104   Ht 5\' 8"  (1.727 m)   Wt 123 lb (55.8 kg)   SpO2 95% Comment: 4L cont  BMI 18.70 kg/m   GEN: Pleasant, interactive, chronically-ill appearing; in no acute distress. HEENT:  Normocephalic and atraumatic. PERRLA. Sclera white. Nasal turbinates pink, moist and patent bilaterally. No rhinorrhea present. Oropharynx pink and moist, without exudate or edema. No lesions, ulcerations, or postnasal drip.  NECK:  Supple w/ fair ROM. No JVD present. Normal carotid impulses w/o bruits. Thyroid symmetrical with no goiter or nodules palpated. No lymphadenopathy.   CV: RRR, no m/r/g, no peripheral edema. Pulses intact, +2 bilaterally. No cyanosis, pallor or clubbing. PULMONARY:  Unlabored, regular breathing. Scattered rhonchi bilaterally A&P w/o wheezes/rales/rhonchi. No accessory muscle use. No dullness to percussion. GI: BS present and normoactive. Soft,  non-tender to palpation.  Neuro: A/Ox3. No focal deficits noted.   Skin: Warm, no lesions or rashe Psych: Normal affect and behavior. Judgement and thought content appropriate.     Lab Results:  CBC    Component Value Date/Time   WBC 16.7 (H) 10/12/2021 1350   RBC 4.63 10/12/2021 1350   HGB 14.9 10/12/2021 1350   HGB WILL FOLLOW 08/01/2017 1031   HCT 43.9  10/12/2021 1350   HCT WILL FOLLOW 08/01/2017 1031   PLT 204 10/12/2021 1350   PLT WILL FOLLOW 08/01/2017 1031   MCV 94.8 10/12/2021 1350   MCV WILL FOLLOW 08/01/2017 1031   MCH 32.2 10/12/2021 1350   MCHC 33.9 10/12/2021 1350   RDW 13.2 10/12/2021 1350   RDW WILL FOLLOW 08/01/2017 1031   LYMPHSABS 1.2 05/22/2018 1148   LYMPHSABS WILL FOLLOW 08/01/2017 1031   MONOABS 0.7 05/22/2018 1148   EOSABS 0.1 05/22/2018 1148   EOSABS WILL FOLLOW 08/01/2017 1031   BASOSABS 0.0 05/22/2018 1148   BASOSABS WILL FOLLOW 08/01/2017 1031    BMET    Component Value Date/Time   NA 141 10/15/2022 1126   NA 140 01/09/2018 1535   K 4.0 10/15/2022 1126   CL 105 10/15/2022 1126   CO2 24 10/15/2022 1126   GLUCOSE 80 10/15/2022 1126   BUN 19 10/15/2022 1126   BUN 14 01/09/2018 1535   CREATININE 1.02 10/15/2022 1126   CALCIUM 9.7 10/15/2022 1126   GFRNONAA >60 10/12/2021 1350   GFRAA >60 05/22/2018 1148    BNP No results found for: "BNP"   Imaging:  CT CHEST LUNG CA SCREEN LOW DOSE W/O CM  Result Date: 10/14/2022 CLINICAL DATA:  62 year old male former smoker with 42 pack-year smoking history, quit smoking 2 years prior. EXAM: CT CHEST WITHOUT CONTRAST LOW-DOSE FOR LUNG CANCER SCREENING TECHNIQUE: Multidetector CT imaging of the chest was performed following the standard protocol without IV contrast. RADIATION DOSE REDUCTION: This exam was performed according to the departmental dose-optimization program which includes automated exposure control, adjustment of the mA and/or kV according to patient size and/or use of iterative  reconstruction technique. COMPARISON:  05/31/2022 chest radiograph. 03/10/2019 chest CT. 05/15/2018 screening chest CT. FINDINGS: Cardiovascular: Normal heart size. No significant pericardial effusion/thickening. Left anterior descending and right coronary atherosclerosis. Atherosclerotic nonaneurysmal thoracic aorta. Normal caliber pulmonary arteries. Mediastinum/Nodes: No significant thyroid nodules. Unremarkable esophagus. No pathologically enlarged axillary, mediastinal or hilar lymph nodes, noting limited sensitivity for the detection of hilar adenopathy on this noncontrast study. Lungs/Pleura: No pneumothorax. No pleural effusion. Severe bullous emphysema in the right upper lobe and severe centrilobular emphysema with diffuse bronchial wall thickening. No acute consolidative airspace disease or lung masses. Previously visualized pulmonary nodules from 05/15/2018 screening chest CT are either stable or resolved, including resolution of the 23.2 mm medial left lower lobe nodule as demonstrated on previous interval imaging. No new significant pulmonary nodules. Upper abdomen: Cholecystectomy. Musculoskeletal: No aggressive appearing focal osseous lesions. Mild thoracic spondylosis. IMPRESSION: 1. Lung-RADS 2, benign appearance or behavior. Continue annual screening with low-dose chest CT without contrast in 12 months. 2. Two-vessel coronary atherosclerosis. 3. Aortic Atherosclerosis (ICD10-I70.0) and Emphysema (ICD10-J43.9). Electronically Signed   By: Delbert Phenix M.D.   On: 10/14/2022 11:40         Latest Ref Rng & Units 04/06/2019   10:48 AM 07/14/2017    1:55 PM  PFT Results  FVC-Pre L 2.57  3.51   FVC-Predicted Pre % 56  76   FVC-Post L 3.89  4.08   FVC-Predicted Post % 85  88   Pre FEV1/FVC % % 57  41   Post FEV1/FCV % % 42  41   FEV1-Pre L 1.48  1.44   FEV1-Predicted Pre % 42  40   FEV1-Post L 1.62  1.69   DLCO uncorrected ml/min/mmHg 8.93  9.72   DLCO UNC% % 33  32   DLVA Predicted % 37  39   TLC L 7.04  7.09   TLC % Predicted % 107  107   RV % Predicted % 152  168     No results found for: "NITRICOXIDE"      Assessment & Plan:   COPD with acute exacerbation AECOPD. He has had multiple exacerbations over the last year. Non-toxic and VS stable on baseline supplemental O2. CT chest without acute process. We will treat him with empiric doxycycline and prednisone taper. We will also start him on daliresp for chronic bronchitis and to help reduce future exacerbations. LFTs checked today. Mucociliary clearance recommended. Action plan in place.  Patient Instructions  -Continue Albuterol inhaler 2 puffs or 3 mL neb every 6 hours as needed for shortness of breath or wheezing. Notify if symptoms persist despite rescue inhaler/neb use. Use you albuterol nebs at least 2-4 times a day until symptoms improve -Continue Perforomist 2 mLs neb Twice daily -Continue pulmicort 1 vial neb Twice daily. Brush tongue and rinse mouth well afterwards -Continue Yupelri 3 mLs neb daily  -Continue supplemental oxygen 3-4 lpm for goal oxygen saturation >88-90%   -Prednisone taper. 4 tabs for 3 days, then 3 tabs for 3 days, 2 tabs for 3 days, then 1 tab for 3 days, then return to 5 mg daily. Take in AM with food.  -Restart guaifenesin 600 mg Twice daily for chest congestion/cough -Flutter valve 2-3 times a day after nebulizer treatments  -Doxycycline 1 tab Twice daily for 7 days. Take with food. Wear sunscreen when outside and keep hat on  -Daliresp 250 mcg 1 tab daily for 4 weeks then increase to 500 mcg daily   Refilled your lorazepam 0.5 mg Twice daily as needed for anxiety - 1 week supply. You will need to follow up with your primary care provider for further refills   I will send a message to the palliative care team to see what happened with your follow ups   Follow up in 2 weeks with Dr. Craige Cotta or Philis Nettle. If symptoms do not improve or worsen, please contact office for sooner follow  up or seek emergency care   Chronic respiratory failure with hypoxia (HCC) Stable on supplemental O2 without any increased requirements. Goal >88-90%  Lung nodule Resolved or stable nodules on recent LDCT chest. Next lung cancer screening CT due 09/2023.   Tobacco abuse He continues to smoke. We had another lengthy conversation about the risks of continued smoking. Smoking cessation strongly advised.   Anxiety Anxiety contributing to increased dyspnea at times. PDMP reviewed today. Lorazepam last filled 4/3. I have sent a week's supply for him to fill on 5/3. Advised he see his PCP for further refills and to discuss other potential treatment options. Verbalized understanding.   Goals of care, counseling/discussion Followed by Palliative care. Last note from November 2023 indicated 8 week follow up. Will see if I can get in touch with them to see why no further visits have been made.    I spent 35 minutes of dedicated to the care of this patient on the date of this encounter to include pre-visit review of records, face-to-face time with the patient discussing conditions above, post visit ordering of testing, clinical documentation with the electronic health record, making appropriate referrals as documented, and communicating necessary findings to members of the patients care team.  Noemi Chapel, NP 10/16/2022  Pt aware and understands NP's role.

## 2022-10-15 NOTE — Patient Instructions (Addendum)
-  Continue Albuterol inhaler 2 puffs or 3 mL neb every 6 hours as needed for shortness of breath or wheezing. Notify if symptoms persist despite rescue inhaler/neb use. Use you albuterol nebs at least 2-4 times a day until symptoms improve -Continue Perforomist 2 mLs neb Twice daily -Continue pulmicort 1 vial neb Twice daily. Brush tongue and rinse mouth well afterwards -Continue Yupelri 3 mLs neb daily  -Continue supplemental oxygen 3-4 lpm for goal oxygen saturation >88-90%   -Prednisone taper. 4 tabs for 3 days, then 3 tabs for 3 days, 2 tabs for 3 days, then 1 tab for 3 days, then return to 5 mg daily. Take in AM with food.  -Restart guaifenesin 600 mg Twice daily for chest congestion/cough -Flutter valve 2-3 times a day after nebulizer treatments  -Doxycycline 1 tab Twice daily for 7 days. Take with food. Wear sunscreen when outside and keep hat on  -Daliresp 250 mcg 1 tab daily for 4 weeks then increase to 500 mcg daily   Refilled your lorazepam 0.5 mg Twice daily as needed for anxiety - 1 week supply. You will need to follow up with your primary care provider for further refills   I will send a message to the palliative care team to see what happened with your follow ups   Follow up in 2 weeks with Dr. Craige Cotta or Philis Nettle. If symptoms do not improve or worsen, please contact office for sooner follow up or seek emergency care

## 2022-10-16 ENCOUNTER — Encounter: Payer: Self-pay | Admitting: Nurse Practitioner

## 2022-10-16 ENCOUNTER — Other Ambulatory Visit (HOSPITAL_COMMUNITY): Payer: Self-pay

## 2022-10-16 NOTE — Progress Notes (Signed)
Reviewed and agree with assessment/plan.   Andriel Omalley, MD [facility] 10/16/2022, 1:13 PM Pager:  [phone]  

## 2022-10-16 NOTE — Assessment & Plan Note (Signed)
Anxiety contributing to increased dyspnea at times. PDMP reviewed today. Lorazepam last filled 4/3. I have sent a week's supply for him to fill on 5/3. Advised he see his PCP for further refills and to discuss other potential treatment options. Verbalized understanding.

## 2022-10-16 NOTE — Assessment & Plan Note (Signed)
Stable on supplemental O2 without any increased requirements. Goal >88-90%

## 2022-10-16 NOTE — Assessment & Plan Note (Signed)
He continues to smoke. We had another lengthy conversation about the risks of continued smoking. Smoking cessation strongly advised.

## 2022-10-16 NOTE — Assessment & Plan Note (Signed)
Followed by Palliative care. Last note from November 2023 indicated 8 week follow up. Will see if I can get in touch with them to see why no further visits have been made.

## 2022-10-16 NOTE — Assessment & Plan Note (Signed)
Resolved or stable nodules on recent LDCT chest. Next lung cancer screening CT due 09/2023.

## 2022-10-16 NOTE — Assessment & Plan Note (Signed)
AECOPD. He has had multiple exacerbations over the last year. Non-toxic and VS stable on baseline supplemental O2. CT chest without acute process. We will treat him with empiric doxycycline and prednisone taper. We will also start him on daliresp for chronic bronchitis and to help reduce future exacerbations. LFTs checked today. Mucociliary clearance recommended. Action plan in place.  Patient Instructions  -Continue Albuterol inhaler 2 puffs or 3 mL neb every 6 hours as needed for shortness of breath or wheezing. Notify if symptoms persist despite rescue inhaler/neb use. Use you albuterol nebs at least 2-4 times a day until symptoms improve -Continue Perforomist 2 mLs neb Twice daily -Continue pulmicort 1 vial neb Twice daily. Brush tongue and rinse mouth well afterwards -Continue Yupelri 3 mLs neb daily  -Continue supplemental oxygen 3-4 lpm for goal oxygen saturation >88-90%   -Prednisone taper. 4 tabs for 3 days, then 3 tabs for 3 days, 2 tabs for 3 days, then 1 tab for 3 days, then return to 5 mg daily. Take in AM with food.  -Restart guaifenesin 600 mg Twice daily for chest congestion/cough -Flutter valve 2-3 times a day after nebulizer treatments  -Doxycycline 1 tab Twice daily for 7 days. Take with food. Wear sunscreen when outside and keep hat on  -Daliresp 250 mcg 1 tab daily for 4 weeks then increase to 500 mcg daily   Refilled your lorazepam 0.5 mg Twice daily as needed for anxiety - 1 week supply. You will need to follow up with your primary care provider for further refills   I will send a message to the palliative care team to see what happened with your follow ups   Follow up in 2 weeks with Dr. Craige Cotta or Philis Nettle. If symptoms do not improve or worsen, please contact office for sooner follow up or seek emergency care

## 2022-10-16 NOTE — Progress Notes (Signed)
CMET nl. Ok to start daliresp. Thanks!

## 2022-10-17 NOTE — Progress Notes (Signed)
Spoke with the pt and notified of results per [facility]. He verbalized understanding and nothing further needed.

## 2022-10-19 DIAGNOSIS — J432 Centrilobular emphysema: Secondary | ICD-10-CM | POA: Diagnosis not present

## 2022-10-19 DIAGNOSIS — J9611 Chronic respiratory failure with hypoxia: Secondary | ICD-10-CM | POA: Diagnosis not present

## 2022-10-21 ENCOUNTER — Other Ambulatory Visit (HOSPITAL_COMMUNITY): Payer: Self-pay

## 2022-10-22 ENCOUNTER — Other Ambulatory Visit (HOSPITAL_COMMUNITY): Payer: Self-pay

## 2022-10-22 ENCOUNTER — Telehealth: Payer: Self-pay

## 2022-10-22 NOTE — Telephone Encounter (Signed)
(  3:19 am) PC SW received a call from patient regarding the palliative care referral. Patient advised that he needed his medications refilled by palliative care. SW advised him of program changes. He stated that he did not feel the team could help and declined any further palliative services at this time. SW advised him that he will be move to the inactive list, however if he had nursing or supportive needs, he could call back with those requests. Patient moved to inactive list.

## 2022-10-23 ENCOUNTER — Other Ambulatory Visit (HOSPITAL_COMMUNITY): Payer: Self-pay

## 2022-10-25 ENCOUNTER — Other Ambulatory Visit (HOSPITAL_COMMUNITY): Payer: Self-pay

## 2022-10-30 ENCOUNTER — Emergency Department (HOSPITAL_COMMUNITY): Payer: Medicaid Other

## 2022-10-30 ENCOUNTER — Other Ambulatory Visit: Payer: Self-pay

## 2022-10-30 ENCOUNTER — Ambulatory Visit: Payer: Medicaid Other | Admitting: Nurse Practitioner

## 2022-10-30 ENCOUNTER — Encounter (HOSPITAL_COMMUNITY): Payer: Self-pay

## 2022-10-30 ENCOUNTER — Inpatient Hospital Stay (HOSPITAL_COMMUNITY)
Admission: EM | Admit: 2022-10-30 | Discharge: 2022-10-31 | DRG: 193 | Disposition: A | Discharge status: Hospice | Payer: Medicaid Other | Attending: Infectious Diseases | Admitting: Infectious Diseases

## 2022-10-30 DIAGNOSIS — J440 Chronic obstructive pulmonary disease with acute lower respiratory infection: Secondary | ICD-10-CM | POA: Diagnosis present

## 2022-10-30 DIAGNOSIS — R6520 Severe sepsis without septic shock: Secondary | ICD-10-CM | POA: Diagnosis not present

## 2022-10-30 DIAGNOSIS — J9621 Acute and chronic respiratory failure with hypoxia: Secondary | ICD-10-CM | POA: Diagnosis not present

## 2022-10-30 DIAGNOSIS — Z886 Allergy status to analgesic agent status: Secondary | ICD-10-CM

## 2022-10-30 DIAGNOSIS — J441 Chronic obstructive pulmonary disease with (acute) exacerbation: Secondary | ICD-10-CM | POA: Diagnosis not present

## 2022-10-30 DIAGNOSIS — Z7189 Other specified counseling: Secondary | ICD-10-CM

## 2022-10-30 DIAGNOSIS — Z515 Encounter for palliative care: Secondary | ICD-10-CM | POA: Diagnosis not present

## 2022-10-30 DIAGNOSIS — R0902 Hypoxemia: Secondary | ICD-10-CM | POA: Diagnosis not present

## 2022-10-30 DIAGNOSIS — K861 Other chronic pancreatitis: Secondary | ICD-10-CM | POA: Diagnosis not present

## 2022-10-30 DIAGNOSIS — J438 Other emphysema: Secondary | ICD-10-CM | POA: Diagnosis present

## 2022-10-30 DIAGNOSIS — R000 Tachycardia, unspecified: Secondary | ICD-10-CM | POA: Diagnosis not present

## 2022-10-30 DIAGNOSIS — Z720 Tobacco use: Secondary | ICD-10-CM | POA: Diagnosis present

## 2022-10-30 DIAGNOSIS — Z8249 Family history of ischemic heart disease and other diseases of the circulatory system: Secondary | ICD-10-CM | POA: Diagnosis not present

## 2022-10-30 DIAGNOSIS — Z833 Family history of diabetes mellitus: Secondary | ICD-10-CM | POA: Diagnosis not present

## 2022-10-30 DIAGNOSIS — F419 Anxiety disorder, unspecified: Secondary | ICD-10-CM | POA: Diagnosis not present

## 2022-10-30 DIAGNOSIS — J9601 Acute respiratory failure with hypoxia: Secondary | ICD-10-CM | POA: Diagnosis not present

## 2022-10-30 DIAGNOSIS — Z1152 Encounter for screening for COVID-19: Secondary | ICD-10-CM

## 2022-10-30 DIAGNOSIS — I2510 Atherosclerotic heart disease of native coronary artery without angina pectoris: Secondary | ICD-10-CM | POA: Diagnosis present

## 2022-10-30 DIAGNOSIS — F10929 Alcohol use, unspecified with intoxication, unspecified: Secondary | ICD-10-CM | POA: Diagnosis present

## 2022-10-30 DIAGNOSIS — F32A Depression, unspecified: Secondary | ICD-10-CM | POA: Diagnosis present

## 2022-10-30 DIAGNOSIS — J189 Pneumonia, unspecified organism: Principal | ICD-10-CM | POA: Diagnosis present

## 2022-10-30 DIAGNOSIS — K860 Alcohol-induced chronic pancreatitis: Secondary | ICD-10-CM | POA: Diagnosis not present

## 2022-10-30 DIAGNOSIS — E872 Acidosis, unspecified: Secondary | ICD-10-CM | POA: Diagnosis not present

## 2022-10-30 DIAGNOSIS — Z79899 Other long term (current) drug therapy: Secondary | ICD-10-CM

## 2022-10-30 DIAGNOSIS — A419 Sepsis, unspecified organism: Secondary | ICD-10-CM | POA: Diagnosis not present

## 2022-10-30 DIAGNOSIS — Z9981 Dependence on supplemental oxygen: Secondary | ICD-10-CM | POA: Diagnosis not present

## 2022-10-30 DIAGNOSIS — J439 Emphysema, unspecified: Secondary | ICD-10-CM | POA: Diagnosis present

## 2022-10-30 DIAGNOSIS — Z681 Body mass index (BMI) 19 or less, adult: Secondary | ICD-10-CM | POA: Diagnosis not present

## 2022-10-30 DIAGNOSIS — F17210 Nicotine dependence, cigarettes, uncomplicated: Secondary | ICD-10-CM | POA: Diagnosis present

## 2022-10-30 DIAGNOSIS — E785 Hyperlipidemia, unspecified: Secondary | ICD-10-CM | POA: Diagnosis present

## 2022-10-30 DIAGNOSIS — R062 Wheezing: Secondary | ICD-10-CM | POA: Diagnosis not present

## 2022-10-30 DIAGNOSIS — Z7951 Long term (current) use of inhaled steroids: Secondary | ICD-10-CM | POA: Diagnosis not present

## 2022-10-30 DIAGNOSIS — E46 Unspecified protein-calorie malnutrition: Secondary | ICD-10-CM | POA: Diagnosis present

## 2022-10-30 DIAGNOSIS — Z66 Do not resuscitate: Secondary | ICD-10-CM | POA: Diagnosis present

## 2022-10-30 DIAGNOSIS — Z87891 Personal history of nicotine dependence: Secondary | ICD-10-CM | POA: Diagnosis not present

## 2022-10-30 DIAGNOSIS — J168 Pneumonia due to other specified infectious organisms: Secondary | ICD-10-CM | POA: Diagnosis not present

## 2022-10-30 LAB — EXPECTORATED SPUTUM ASSESSMENT W GRAM STAIN, RFLX TO RESP C

## 2022-10-30 LAB — COMPREHENSIVE METABOLIC PANEL
ALT: 26 U/L (ref 0–44)
AST: 39 U/L (ref 15–41)
Albumin: 2.5 g/dL — ABNORMAL LOW (ref 3.5–5.0)
Alkaline Phosphatase: 87 U/L (ref 38–126)
Anion gap: 14 (ref 5–15)
BUN: 29 mg/dL — ABNORMAL HIGH (ref 6–20)
CO2: 24 mmol/L (ref 22–32)
Calcium: 9.1 mg/dL (ref 8.9–10.3)
Chloride: 97 mmol/L — ABNORMAL LOW (ref 98–111)
Creatinine, Ser: 1.05 mg/dL (ref 0.61–1.24)
GFR, Estimated: >60 mL/min (ref >60)
Glucose, Bld: 187 mg/dL — ABNORMAL HIGH (ref 70–99)
Potassium: 4.3 mmol/L (ref 3.5–5.1)
Sodium: 135 mmol/L (ref 135–145)
Total Bilirubin: 0.3 mg/dL (ref 0.3–1.2)
Total Protein: 7 g/dL (ref 6.5–8.1)

## 2022-10-30 LAB — CBC WITH DIFFERENTIAL/PLATELET
Abs Immature Granulocytes: 0.05 10*3/uL (ref 0.00–0.07)
Basophils Absolute: 0 10*3/uL (ref 0.0–0.1)
Basophils Relative: 0 %
Eosinophils Absolute: 0 10*3/uL (ref 0.0–0.5)
Eosinophils Relative: 0 %
HCT: 40.5 % (ref 39.0–52.0)
Hemoglobin: 13.5 g/dL (ref 13.0–17.0)
Immature Granulocytes: 0 %
Lymphocytes Relative: 1 %
Lymphs Abs: 0.2 10*3/uL — ABNORMAL LOW (ref 0.7–4.0)
MCH: 32.7 pg (ref 26.0–34.0)
MCHC: 33.3 g/dL (ref 30.0–36.0)
MCV: 98.1 fL (ref 80.0–100.0)
Monocytes Absolute: 0.8 10*3/uL (ref 0.1–1.0)
Monocytes Relative: 5 %
Neutro Abs: 15.8 10*3/uL — ABNORMAL HIGH (ref 1.7–7.7)
Neutrophils Relative %: 94 %
Platelets: 200 10*3/uL (ref 150–400)
RBC: 4.13 MIL/uL — ABNORMAL LOW (ref 4.22–5.81)
RDW: 12 % (ref 11.5–15.5)
WBC: 16.9 10*3/uL — ABNORMAL HIGH (ref 4.0–10.5)
nRBC: 0 % (ref 0.0–0.2)

## 2022-10-30 LAB — LACTIC ACID, PLASMA
Lactic Acid, Venous: 2.6 mmol/L (ref 0.5–1.9)
Lactic Acid, Venous: 4.5 mmol/L (ref 0.5–1.9)
Lactic Acid, Venous: 3.6 mmol/L (ref 0.5–1.9)
Lactic Acid, Venous: 4.8 mmol/L (ref 0.5–1.9)
Lactic Acid, Venous: 2.7 mmol/L (ref 0.5–1.9)

## 2022-10-30 LAB — BLOOD GAS, ARTERIAL
Acid-base deficit: 1.3 mmol/L (ref 0.0–2.0)
Bicarbonate: 24.8 mmol/L (ref 20.0–28.0)
Drawn by: 36527
O2 Saturation: 97 %
Patient temperature: 36.7
pCO2 arterial: 45 mmHg (ref 32–48)
pH, Arterial: 7.34 — ABNORMAL LOW (ref 7.35–7.45)
pO2, Arterial: 74 mmHg — ABNORMAL LOW (ref 83–108)

## 2022-10-30 LAB — PROTIME-INR
INR: 1.2 (ref 0.8–1.2)
Prothrombin Time: 15.7 seconds — ABNORMAL HIGH (ref 11.4–15.2)

## 2022-10-30 LAB — RESP PANEL BY RT-PCR (RSV, FLU A&B, COVID)  RVPGX2
Influenza A by PCR: NEGATIVE
Influenza B by PCR: NEGATIVE
Resp Syncytial Virus by PCR: NEGATIVE
SARS Coronavirus 2 by RT PCR: NEGATIVE

## 2022-10-30 LAB — APTT: aPTT: 38 seconds — ABNORMAL HIGH (ref 24–36)

## 2022-10-30 MED ORDER — BUDESONIDE 0.5 MG/2ML IN SUSP
0.5000 mg | Freq: Two times a day (BID) | RESPIRATORY_TRACT | Status: DC
  Administered 2022-10-30 – 2022-10-31 (×2): 0.5 mg via RESPIRATORY_TRACT
  Filled 2022-10-30 (×2): qty 2

## 2022-10-30 MED ORDER — ALBUTEROL SULFATE (2.5 MG/3ML) 0.083% IN NEBU
2.5000 mg | INHALATION_SOLUTION | RESPIRATORY_TRACT | Status: DC | PRN
  Administered 2022-10-30 – 2022-10-31 (×3): 2.5 mg via RESPIRATORY_TRACT
  Filled 2022-10-30 (×3): qty 3

## 2022-10-30 MED ORDER — ROFLUMILAST 500 MCG PO TABS: 250.0000 ug | ORAL_TABLET | Freq: Every day | ORAL | Status: DC

## 2022-10-30 MED ORDER — GUAIFENESIN ER 600 MG PO TB12
600.0000 mg | ORAL_TABLET | Freq: Two times a day (BID) | ORAL | Status: DC
  Administered 2022-10-30 – 2022-10-31 (×4): 600 mg via ORAL
  Filled 2022-10-30 (×4): qty 1

## 2022-10-30 MED ORDER — LORAZEPAM 0.5 MG PO TABS
0.5000 mg | ORAL_TABLET | Freq: Two times a day (BID) | ORAL | Status: DC | PRN
  Administered 2022-10-30 – 2022-10-31 (×2): 0.5 mg via ORAL
  Filled 2022-10-30 (×2): qty 1

## 2022-10-30 MED ORDER — RIVAROXABAN 10 MG PO TABS
10.0000 mg | ORAL_TABLET | Freq: Every day | ORAL | Status: DC
  Administered 2022-10-30 – 2022-10-31 (×2): 10 mg via ORAL
  Filled 2022-10-30 (×2): qty 1

## 2022-10-30 MED ORDER — FENTANYL CITRATE PF 50 MCG/ML IJ SOSY
50.0000 ug | PREFILLED_SYRINGE | Freq: Once | INTRAMUSCULAR | Status: AC
  Administered 2022-10-30: 50 ug via INTRAVENOUS
  Filled 2022-10-30: qty 1

## 2022-10-30 MED ORDER — MAGNESIUM SULFATE 2 GM/50ML IV SOLN
2.0000 g | INTRAVENOUS | Status: AC
  Administered 2022-10-30: 2 g via INTRAVENOUS
  Filled 2022-10-30: qty 50

## 2022-10-30 MED ORDER — LACTATED RINGERS IV BOLUS
1000.0000 mL | Freq: Once | INTRAVENOUS | Status: AC
  Administered 2022-10-30: 1000 mL via INTRAVENOUS

## 2022-10-30 MED ORDER — TRAMADOL HCL 50 MG PO TABS
100.0000 mg | ORAL_TABLET | Freq: Four times a day (QID) | ORAL | Status: DC | PRN
  Administered 2022-10-30 – 2022-10-31 (×4): 100 mg via ORAL
  Filled 2022-10-30 (×4): qty 2

## 2022-10-30 MED ORDER — METHYLPREDNISOLONE SODIUM SUCC 40 MG IJ SOLR: 40.0000 mg | Freq: Every day | INTRAMUSCULAR | Status: DC

## 2022-10-30 MED ORDER — SODIUM CHLORIDE 0.9 % IV SOLN
2.0000 g | INTRAVENOUS | Status: DC
  Administered 2022-10-30 – 2022-10-31 (×2): 2 g via INTRAVENOUS
  Filled 2022-10-30 (×2): qty 20

## 2022-10-30 MED ORDER — LACTATED RINGERS IV SOLN: INTRAVENOUS | Status: DC

## 2022-10-30 MED ORDER — ALBUTEROL SULFATE (2.5 MG/3ML) 0.083% IN NEBU
10.0000 mg | INHALATION_SOLUTION | RESPIRATORY_TRACT | Status: AC
  Administered 2022-10-30: 10 mg via RESPIRATORY_TRACT
  Filled 2022-10-30: qty 12

## 2022-10-30 MED ORDER — REVEFENACIN 175 MCG/3ML IN SOLN
175.0000 ug | Freq: Every day | RESPIRATORY_TRACT | Status: DC
  Administered 2022-10-30 – 2022-10-31 (×2): 175 ug via RESPIRATORY_TRACT
  Filled 2022-10-30 (×2): qty 3

## 2022-10-30 MED ORDER — ARFORMOTEROL TARTRATE 15 MCG/2ML IN NEBU
15.0000 ug | INHALATION_SOLUTION | Freq: Two times a day (BID) | RESPIRATORY_TRACT | Status: DC
  Administered 2022-10-30 – 2022-10-31 (×2): 15 ug via RESPIRATORY_TRACT
  Filled 2022-10-30 (×2): qty 2

## 2022-10-30 MED ORDER — LACTATED RINGERS IV BOLUS
500.0000 mL | Freq: Once | INTRAVENOUS | Status: AC
  Administered 2022-10-30: 500 mL via INTRAVENOUS

## 2022-10-30 MED ORDER — SODIUM CHLORIDE 0.9 % IV SOLN
500.0000 mg | INTRAVENOUS | Status: DC
  Administered 2022-10-30: 500 mg via INTRAVENOUS
  Filled 2022-10-30: qty 5

## 2022-10-30 NOTE — Consult Note (Signed)
NAME:  [name], MRN:  [id number], DOB:  09-27-1960, LOS: 0 ADMISSION DATE:  10/30/2022, CONSULTATION DATE:  10/30/22 REFERRING MD:  Hyacinth Meeker CHIEF COMPLAINT:  Dyspnea   History of Present Illness:  [name] is a 62 y.o. M with PMH of severe COPD on home O2 and followed by Dr. Craige Cotta (PFT's from 2020 with FEV1 46%, ratio 57, DLCO 33%).  He presented to [facility] ED 5/8 with dyspnea, worsening cough and wheeze, along with increased WOB.    Last seen in office 4/23 for acute visit, worsening SOB x 3 weeks, with productive greenish sputum, CT 10/10/22 neg, doxy and steroid taper, and started on daliresp.  Given short refill of his ativan he takes for chronic dyspnea, which greatly helps per patient, and was mention of reaching back out to palliative care for symptom management as pt had lost follow up since November 2023 (provider he was seeing had left the practice).  He completed abx course and tapered back to his baseline of 5mg  prednisone daily and was feeling better but ran out of his nebulizer's on Sunday, 5/5 along with his ativan and since developed chills, worsening productive cough with sticky greenish sputum and blood streaked, wheezing, SOB.  Denies any LE edema or sick exposures.  Is not taking methotrexate anymore.  Not taking any anticoagulants or ASA.  Unable to recall how many liters of home O2 he is on, last visit charted 3-4L. Confirms his DNR/ DNI status.    In ER, requiring up to NRB since weaned down to 6L O2, afebrile, tachycardic and normotensive.  Treated with nebs, solumedrol, magnesium.  CXR demonstrated RLL PNA.  WBC 16.9, Hgb wnl, respiratory panel pending.  Zithromax and ceftriaxone started.  PCCM asked to see in consultation for further recs.  Pertinent  Medical History  COPD with bullous emphysema, chronic hypoxic respiratory failure on , former smoker, pancreatitis, chronic cholecystitis, psoriasis on methotrexate, HLD, depression  04/06/2019 PFTs: FVC 85%, FEV1 46%, ratio  57, TLC 107%, DLCOunc 33%. Severe obstructive airway disease with increased lung volumes and severe diffusion defect. No BD   Significant Hospital Events: Including procedures, antibiotic start and stop dates in addition to other pertinent events   5/8 admit.  Interim History / Subjective:   Objective   Blood pressure (!) 148/87, pulse (!) 109, temperature 98.1 F (36.7 C), temperature source Axillary, resp. rate (!) 28, height 5\' 8"  (1.727 m), weight 55.8 kg, SpO2 98 %.       No intake or output data in the 24 hours ending 10/30/22 1204 Filed Weights   10/30/22 1031  Weight: 55.8 kg   Examination: General:  AoC ill appearing thin male sitting upright in ER stretcher HEENT: MM pink/moist, edentulous Neuro: Aox3, MAE CV: rr, ST PULM:  mildly tachypneic at rest, worst with talking but speaking in full sentences, diffuse exp wheeze, weaned from NRB to salter HFNC at 6L during my exam GI: soft, bs+ Extremities: warm/dry, no LE edema  Skin: few patchy scaly rash to hands   Assessment & Plan:   AECOPD AoC hypoxic respiratory failure (on home O2 chronically) - 2/2 AECOPD and RLL PNA. - Continue supplemental O2 as needed to maintain SpO2 > 88-94%.  Unclear baseline HOT, ?3-4L vs 6L - Can consider BiPAP PRN for increased WOB however pt reports claustrophobia.  Consider HHFNC if ongoing WOB.  Remains DNI confirmed with pt.  - continue Azithromycin/Ceftriaxone. - Follow cultures, send sputum if able to produce - SARS/ flu/  RSV neg - check urine strep and legionella, MRSA PCR - cont Solumedrol 40mg  daily with slow taper.  May need higher dose than his baseline of 5mg   - Budesonide/Brovana in lieu of home Perforomist/Pulmicort. - Continue home Yupelri.  Albuterol prn  - Ativan 0.5mg  q12hrs Anxiety (uses this at home) - Push IS/Flutter valve, mucinex - may need to consider ppx azithro moving forward.  Continue daliresp 250 mcg for now (for 4 weeks, prescribed 4/23, then increase to 500  mcg daily) - pulmonary will continue to follow     Ongoing tobacco dependence. - Cessation strongly encouraged.    Hx lung nodule - stable. - F/u CT in April 2025.   Goals of care: Per our last outpatient pulmonary note, pt had been seeing palliative care as an outpatient, last seen November 2023 then lost to follow up after provider left practice. There was mention of re-engaging palliative care as of 10/15/22. - Will place palliative care consult while he is inpatient and continue his ativan 0.5mg  BID prn - patient confirms his code status of DNI/ DNR   Best Practice (right click and "Reselect all SmartList Selections" daily)   Diet/type: Regular consistency (see orders) DVT prophylaxis: prophylactic heparin  GI prophylaxis: PPI Lines: N/A Foley:  N/A Code Status:  DNR Last date of multidisciplinary goals of care discussion [10/30/22]  Labs   CBC: No results for input(s): "WBC", "NEUTROABS", "HGB", "HCT", "MCV", "PLT" in the last 168 hours.  Basic Metabolic Panel: No results for input(s): "NA", "K", "CL", "CO2", "GLUCOSE", "BUN", "CREATININE", "CALCIUM", "MG", "PHOS" in the last 168 hours. GFR: Estimated Creatinine Clearance: 60.8 mL/min (by C-G formula based on SCr of 1.02 mg/dL). No results for input(s): "PROCALCITON", "WBC", "LATICACIDVEN" in the last 168 hours.  Liver Function Tests: No results for input(s): "AST", "ALT", "ALKPHOS", "BILITOT", "PROT", "ALBUMIN" in the last 168 hours. No results for input(s): "LIPASE", "AMYLASE" in the last 168 hours. No results for input(s): "AMMONIA" in the last 168 hours.  ABG    Component Value Date/Time   TCO2 16 12/05/2009 1124     Coagulation Profile: No results for input(s): "INR", "PROTIME" in the last 168 hours.  Cardiac Enzymes: No results for input(s): "CKTOTAL", "CKMB", "CKMBINDEX", "TROPONINI" in the last 168 hours.  HbA1C: No results found for: "HGBA1C"  CBG: No results for input(s): "GLUCAP" in the last 168  hours.  Review of Systems:   Review of Systems  Constitutional:  Positive for chills and malaise/fatigue. Negative for fever.  Respiratory:  Positive for cough, sputum production, shortness of breath and wheezing.   Cardiovascular:  Negative for chest pain and leg swelling.  Gastrointestinal:  Negative for nausea and vomiting.   Past Medical History:  He,  has a past medical history of Chronic pancreatitis (HCC), Depression, Duodenitis, Emphysema, History of alcohol abuse, Hyperlipidemia, Psoriasis, RUE weakness, and Stomach ulcer.   Surgical History:   Past Surgical History:  Procedure Laterality Date   ESOPHAGOGASTRODUODENOSCOPY (EGD) WITH PROPOFOL N/A 12/04/2016   Procedure: ESOPHAGOGASTRODUODENOSCOPY (EGD) WITH PROPOFOL;  Surgeon: Kathi Der, MD;  Location: [facility];  Service: Gastroenterology;  Laterality: N/A;   LAPAROSCOPIC CHOLECYSTECTOMY  11/28/2016   w/IOC   LAPAROSCOPIC CHOLECYSTECTOMY SINGLE SITE WITH INTRAOPERATIVE CHOLANGIOGRAM N/A 11/28/2016   Procedure: LAPAROSCOPIC CHOLECYSTECTOMY SINGLE SITE WITH INTRAOPERATIVE CHOLANGIOGRAM;  Surgeon: Karie Soda, MD;  Location: [facility];  Service: General;  Laterality: N/A;   LIVER BIOPSY N/A 11/28/2016   Procedure: NEEDLE CORE LIVER BIOPSY;  Surgeon: Karie Soda, MD;  Location: [facility];  Service: General;  Laterality: N/A;   TYMPANOSTOMY TUBE PLACEMENT Bilateral ~ 1970     Social History:   reports that he quit smoking about 4 years ago. His smoking use included cigarettes. He has a 4.00 pack-year smoking history. He has never used smokeless tobacco. He reports that he does not currently use alcohol. He reports that he does not currently use drugs after having used the following drugs: Marijuana.   Family History:  His family history includes Cancer in his father; Diabetes in his mother; Heart disease in his father.   Allergies Allergies  Allergen Reactions   Nsaids Other (See Comments)    Bleeding gastritis      Home Medications  Prior to Admission medications   Medication Sig Start Date End Date Taking? Authorizing Provider  albuterol (PROVENTIL) (2.5 MG/3ML) 0.083% nebulizer solution Take 3 mLs by nebulization every 6 (six) hours as needed for wheezing or shortness of breath. 09/02/22   Coralyn Helling, MD  albuterol (VENTOLIN HFA) 108 (90 Base) MCG/ACT inhaler Inhale 2 puffs into the lungs every 6 (six) hours as needed for wheezing or shortness of breath. 10/04/22   Coralyn Helling, MD  amitriptyline (ELAVIL) 50 MG tablet Take 1 tablet (50 mg total) by mouth at bedtime. 05/28/22   Marrianne Mood, MD  cefUROXime (CEFTIN) 500 MG tablet Take 1 tablet (500 mg total) by mouth 2 (two) times daily with a meal. 02/18/22   Coralyn Helling, MD  DULoxetine (CYMBALTA) 30 MG capsule Take 1 capsule (30 mg total) by mouth 2 (two) times daily. 11/28/21   Andrey Campanile, MD  formoterol (PERFOROMIST) 20 MCG/2ML nebulizer solution Take 2 mLs (20 mcg total) by nebulization 2 (two) times daily. 07/01/22   Coralyn Helling, MD  hydrOXYzine (ATARAX) 25 MG tablet Take 1 tablet (25 mg total) by mouth 3 (three) times daily as needed. 07/16/21     LORazepam (ATIVAN) 0.5 MG tablet Take 1 tablet (0.5 mg total) by mouth 2 (two) times daily as needed for anxiety. 10/25/22   Cobb, Ruby Cola, NP  omeprazole (PRILOSEC) 40 MG capsule Take 1 capsule (40 mg total) by mouth daily. 07/12/22   Marrianne Mood, MD  ondansetron (ZOFRAN) 4 MG tablet Take 1 tablet (4 mg total) by mouth every 6 (six) hours. 10/12/21   Charlynne Pander, MD  predniSONE (DELTASONE) 5 MG tablet Take 1 tablet (5 mg total) by mouth daily with breakfast. 06/21/22   Cobb, Ruby Cola, NP  PULMICORT 0.5 MG/2ML nebulizer solution Inhale 2 mLs (0.5 mg total) into the lungs 2 (two) times daily. 12/19/21   Coralyn Helling, MD  revefenacin (YUPELRI) 175 MCG/3ML nebulizer solution Take 3 mLs (175 mcg total) by nebulization daily. 07/09/22 11/07/22  Coralyn Helling, MD  traMADol (ULTRAM) 50 MG  tablet Take 2 tablets (100 mg total) by mouth every 6 (six) hours as needed. 09/24/22   Marrianne Mood, MD  sertraline (ZOLOFT) 50 MG tablet Take 1 tablet (50 mg total) by mouth daily. 03/27/20 06/28/20  Theotis Barrio, MD     Critical care time: n/a     Posey Boyer, MSN, AG-ACNP-BC [facility] 10/30/2022, 1:36 PM  See Amion for pager If no response to pager, please call PCCM consult pager After 7:00 pm call Elink

## 2022-10-30 NOTE — Progress Notes (Signed)
  Transition of Care [facility]) Screening Note   Patient Details  Name: [name] Date of Birth: 09-16-1960   Transition of Care [facility]) CM/SW Contact:    Leone Haven, RN Phone Number: 10/30/2022, 4:02 PM    Transition of Care Department [facility]) has reviewed patient, from home with Mom, has PCP and insurance on file, presents with PNA, Bullous Emphysema.   We will continue to monitor patient advancement through interdisciplinary progression rounds. If new patient transition needs arise, please place a TOC consult.

## 2022-10-30 NOTE — ED Notes (Signed)
ED TO INPATIENT HANDOFF REPORT  ED Nurse Name and Phone #: Brett Canales [phone]  S Name/Age/Gender [name] 62 y.o. male Room/Bed: 022C/022C  Code Status   Code Status: DNR  Home/SNF/Other Home Patient oriented to: self, place, time, and situation Is this baseline? Yes   Triage Complete: Triage complete  Chief Complaint Acute on chronic hypoxic respiratory failure (HCC) [J96.21]  Triage Note From home, hx of COPD. Shortness of breath. Patient reports possible fever over the past few days, no current fever per EMS. Wheezing. 2x duoneb given, 125mg  of solu-medrol given. Patient had no improvement with treatments at home   Allergies Allergies  Allergen Reactions   Nsaids Other (See Comments)    Bleeding gastritis    Level of Care/Admitting Diagnosis ED Disposition     ED Disposition  Admit   Condition  --   Comment  Hospital Area: [facility] [[id number]]  Level of Care: Progressive [102]  Admit to Progressive based on following criteria: RESPIRATORY PROBLEMS hypoxemic/hypercapnic respiratory failure that is responsive to NIPPV (BiPAP) or High Flow Nasal Cannula (6-80 lpm). Frequent assessment/intervention, no > Q2 hrs < Q4 hrs, to maintain oxygenation and pulmonary hygiene.  May admit patient to [facility] or [facility] if equivalent level of care is available:: No  Covid Evaluation: Asymptomatic - no recent exposure (last 10 days) testing not required  Diagnosis: Acute on chronic hypoxic respiratory failure [facility]) [[id number]]  Admitting Physician: Ginnie Smart [[id number]]  Attending Physician: Ninetta Lights, JEFFREY C [[id number]]  Certification:: I certify this patient will need inpatient services for at least 2 midnights  Estimated Length of Stay: 2          B Medical/Surgery History Past Medical History:  Diagnosis Date   Chronic pancreatitis (HCC)    Depression    Duodenitis    by EGD 1/07   Emphysema    unclear diagnosis noted in previous EMR    History of alcohol abuse    Hyperlipidemia    Psoriasis    extensive, responds to strong steroids, unable to start molecular therapies due to cost   RUE weakness    2/2 cervical spondylosis   Stomach ulcer    Past Surgical History:  Procedure Laterality Date   ESOPHAGOGASTRODUODENOSCOPY (EGD) WITH PROPOFOL N/A 12/04/2016   Procedure: ESOPHAGOGASTRODUODENOSCOPY (EGD) WITH PROPOFOL;  Surgeon: Kathi Der, MD;  [facility]: [facility];  Service: Gastroenterology;  Laterality: N/A;   LAPAROSCOPIC CHOLECYSTECTOMY  11/28/2016   w/IOC   LAPAROSCOPIC CHOLECYSTECTOMY SINGLE SITE WITH INTRAOPERATIVE CHOLANGIOGRAM N/A 11/28/2016   Procedure: LAPAROSCOPIC CHOLECYSTECTOMY SINGLE SITE WITH INTRAOPERATIVE CHOLANGIOGRAM;  Surgeon: Karie Soda, MD;  Location: [facility];  Service: General;  Laterality: N/A;   LIVER BIOPSY N/A 11/28/2016   Procedure: NEEDLE CORE LIVER BIOPSY;  Surgeon: Karie Soda, MD;  Location: [facility];  Service: General;  Laterality: N/A;   TYMPANOSTOMY TUBE PLACEMENT Bilateral ~ 1970     A IV Location/Drains/Wounds Patient Lines/Drains/Airways Status     Active Line/Drains/Airways     Name Placement date Placement time Site Days   Peripheral IV 10/30/22 20 G 1.88" Anterior;Left;Upper Arm 10/30/22  1126  Arm  less than 1            Intake/Output Last 24 hours No intake or output data in the 24 hours ending 10/30/22 1459  Labs/Imaging Results for orders placed or performed during the hospital encounter of 10/30/22 (from the past 48 hour(s))  Resp panel by RT-PCR (RSV, Flu A&B, Covid) Anterior Nasal Swab  Status: None   Collection Time: 10/30/22 10:33 AM   Specimen: Anterior Nasal Swab  Result Value Ref Range   SARS Coronavirus 2 by RT PCR NEGATIVE NEGATIVE   Influenza A by PCR NEGATIVE NEGATIVE   Influenza B by PCR NEGATIVE NEGATIVE    Comment: (NOTE) The Xpert Xpress SARS-CoV-2/FLU/RSV plus assay is intended as an aid in the diagnosis of influenza from  Nasopharyngeal swab specimens and should not be used as a sole basis for treatment. Nasal washings and aspirates are unacceptable for Xpert Xpress SARS-CoV-2/FLU/RSV testing.  Fact Sheet for Patients: BloggerCourse.com  Fact Sheet for Healthcare Providers: SeriousBroker.it  This test is not yet approved or cleared by the [place] FDA and has been authorized for detection and/or diagnosis of SARS-CoV-2 by FDA under an Emergency Use Authorization (EUA). This EUA will remain in effect (meaning this test can be used) for the duration of the COVID-19 declaration under Section 564(b)(1) of the Act, 21 U.S.C. section 360bbb-3(b)(1), unless the authorization is terminated or revoked.     Resp Syncytial Virus by PCR NEGATIVE NEGATIVE    Comment: (NOTE) Fact Sheet for Patients: BloggerCourse.com  Fact Sheet for Healthcare Providers: SeriousBroker.it  This test is not yet approved or cleared by the [place] FDA and has been authorized for detection and/or diagnosis of SARS-CoV-2 by FDA under an Emergency Use Authorization (EUA). This EUA will remain in effect (meaning this test can be used) for the duration of the COVID-19 declaration under Section 564(b)(1) of the Act, 21 U.S.C. section 360bbb-3(b)(1), unless the authorization is terminated or revoked.  Performed at [facility], [address] [place], [place] [address]   Comprehensive metabolic panel     Status: Abnormal   Collection Time: 10/30/22 10:33 AM  Result Value Ref Range   Sodium 135 135 - 145 mmol/L   Potassium 4.3 3.5 - 5.1 mmol/L   Chloride 97 (L) 98 - 111 mmol/L   CO2 24 22 - 32 mmol/L   Glucose, Bld 187 (H) 70 - 99 mg/dL    Comment: Glucose reference range applies only to samples taken after fasting for at least 8 hours.   BUN 29 (H) 6 - 20 mg/dL   Creatinine, Ser 6.04 0.61 - 1.24 mg/dL   Calcium  9.1 8.9 - 54.0 mg/dL   Total Protein 7.0 6.5 - 8.1 g/dL   Albumin 2.5 (L) 3.5 - 5.0 g/dL   AST 39 15 - 41 U/L   ALT 26 0 - 44 U/L   Alkaline Phosphatase 87 38 - 126 U/L   Total Bilirubin 0.3 0.3 - 1.2 mg/dL   GFR, Estimated >98 >11 mL/min    Comment: (NOTE) Calculated using the CKD-EPI Creatinine Equation (2021)    Anion gap 14 5 - 15    Comment: Performed at [facility], [address], [place], [place] [address]  CBC with Differential     Status: Abnormal   Collection Time: 10/30/22 10:33 AM  Result Value Ref Range   WBC 16.9 (H) 4.0 - 10.5 K/uL   RBC 4.13 (L) 4.22 - 5.81 MIL/uL   Hemoglobin 13.5 13.0 - 17.0 g/dL   HCT 29.5 62.1 - 30.8 %   MCV 98.1 80.0 - 100.0 fL   MCH 32.7 26.0 - 34.0 pg   MCHC 33.3 30.0 - 36.0 g/dL   RDW 65.7 84.6 - 96.2 %   Platelets 200 150 - 400 K/uL   nRBC 0.0 0.0 - 0.2 %  Neutrophils Relative % 94 %   Neutro Abs 15.8 (H) 1.7 - 7.7 K/uL   Lymphocytes Relative 1 %   Lymphs Abs 0.2 (L) 0.7 - 4.0 K/uL   Monocytes Relative 5 %   Monocytes Absolute 0.8 0.1 - 1.0 K/uL   Eosinophils Relative 0 %   Eosinophils Absolute 0.0 0.0 - 0.5 K/uL   Basophils Relative 0 %   Basophils Absolute 0.0 0.0 - 0.1 K/uL   Immature Granulocytes 0 %   Abs Immature Granulocytes 0.05 0.00 - 0.07 K/uL    Comment: Performed at [facility], [address], [place], [place] [address]  Protime-INR     Status: Abnormal   Collection Time: 10/30/22 10:33 AM  Result Value Ref Range   Prothrombin Time 15.7 (H) 11.4 - 15.2 seconds   INR 1.2 0.8 - 1.2    Comment: (NOTE) INR goal varies based on device and disease states. Performed at [facility], [address], [place], [place] [address]   APTT     Status: Abnormal   Collection Time: 10/30/22 10:33 AM  Result Value Ref Range   aPTT 38 (H) 24 - 36 seconds    Comment:        IF BASELINE aPTT IS ELEVATED, SUGGEST PATIENT RISK ASSESSMENT BE USED TO DETERMINE APPROPRIATE ANTICOAGULANT THERAPY. Performed at  [facility], [address] [place], [place] [address]   Lactic acid, plasma     Status: Abnormal   Collection Time: 10/30/22 11:45 AM  Result Value Ref Range   Lactic Acid, Venous 2.6 (HH) 0.5 - 1.9 mmol/L    Comment: CRITICAL RESULT CALLED TO, READ BACK BY AND VERIFIED WITH Cindi Carbon, RN AT 1323 05.08.24 D. BLU Performed at [facility], [address], [place], [place] [address]    DG Chest Port 1 View  Result Date: 10/30/2022 CLINICAL DATA:  Shortness of breath, history of COPD. EXAM: PORTABLE CHEST 1 VIEW COMPARISON:  Chest radiograph 05/31/2022, CT chest 10/10/2022. FINDINGS: The cardiomediastinal silhouette is normal. Emphysematous changes are again seen in both lungs. There is new extensive airspace opacity in the right lower lobe consistent with pneumonia. There is no other focal airspace consolidation. There is no pulmonary edema. There is no significant pleural effusion. There is no pneumothorax There is no acute osseous abnormality. IMPRESSION: Right lower lobe pneumonia. Electronically Signed   By: Lesia Hausen M.D.   On: 10/30/2022 10:58    Pending Labs Unresulted Labs (From admission, onward)     Start     Ordered   10/31/22 0500  HIV Antibody (routine testing w rflx)  (HIV Antibody (Routine testing w reflex) panel)  Tomorrow morning,   R        10/30/22 1449   10/31/22 0500  Basic metabolic panel  Tomorrow morning,   R        10/30/22 1449   10/31/22 0500  CBC  Tomorrow morning,   R        10/30/22 1449   10/30/22 1339  Expectorated Sputum Assessment w Gram Stain, Rflx to Resp Cult  Once,   R        10/30/22 1339   10/30/22 1215  Strep pneumoniae urinary antigen  Once,   URGENT        10/30/22 1214   10/30/22 1215  Legionella Pneumophila Serogp 1 Ur Ag  Once,   URGENT        10/30/22 1214   10/30/22  1215  MRSA Next Gen by PCR, Nasal  Once,   URGENT        10/30/22 1214   10/30/22 1033  Lactic acid, plasma  (Septic presentation on arrival (screening  labs, nursing and treatment orders for obvious sepsis))  Now then every 2 hours,   R (with STAT occurrences)      10/30/22 1034   10/30/22 1033  Blood Culture (routine x 2)  (Septic presentation on arrival (screening labs, nursing and treatment orders for obvious sepsis))  BLOOD CULTURE X 2,   STAT      10/30/22 1034            Vitals/Pain Today's Vitals   10/30/22 1300 10/30/22 1400 10/30/22 1415 10/30/22 1434  BP: 108/84 111/78    Pulse: 79 (!) 102 (!) 101   Resp:  (!) 32 (!) 27   Temp:    (!) 97.5 F (36.4 C)  TempSrc:    Oral  SpO2: 95% (!) 85% 95%   Weight:      Height:      PainSc:        Isolation Precautions No active isolations  Medications Medications  cefTRIAXone (ROCEPHIN) 2 g in sodium chloride 0.9 % 100 mL IVPB (0 g Intravenous Stopped 10/30/22 1427)  azithromycin (ZITHROMAX) 500 mg in sodium chloride 0.9 % 250 mL IVPB (0 mg Intravenous Stopped 10/30/22 1428)  albuterol (PROVENTIL) (2.5 MG/3ML) 0.083% nebulizer solution 10 mg (0 mg Nebulization Stopped 10/30/22 1210)  albuterol (PROVENTIL) (2.5 MG/3ML) 0.083% nebulizer solution 2.5 mg (has no administration in time range)  revefenacin (YUPELRI) nebulizer solution 175 mcg (175 mcg Nebulization Given 10/30/22 1314)  arformoterol (BROVANA) nebulizer solution 15 mcg (15 mcg Nebulization Given 10/30/22 1314)  budesonide (PULMICORT) nebulizer solution 0.5 mg (0.5 mg Nebulization Given 10/30/22 1314)  LORazepam (ATIVAN) tablet 0.5 mg (has no administration in time range)  methylPREDNISolone sodium succinate (SOLU-MEDROL) 40 mg/mL injection 40 mg (has no administration in time range)  guaiFENesin (MUCINEX) 12 hr tablet 600 mg (600 mg Oral Given 10/30/22 1434)  rivaroxaban (XARELTO) tablet 10 mg (has no administration in time range)  magnesium sulfate IVPB 2 g 50 mL (0 g Intravenous Stopped 10/30/22 1427)  fentaNYL (SUBLIMAZE) injection 50 mcg (50 mcg Intravenous Given 10/30/22 1130)    Mobility walks     Focused  Assessments Pulmonary Assessment Handoff:  Lung sounds: Bilateral Breath Sounds: Diminished (course) L Breath Sounds: Expiratory wheezes R Breath Sounds: Expiratory wheezes O2 Device: NRB O2 Flow Rate (L/min): 15 L/min    R Recommendations: See Admitting Provider Note  Report given to:   Additional Notes:

## 2022-10-30 NOTE — Hospital Course (Addendum)
Clinic patient  RR 30-40 HR 100-115 BP 148/87   RVP -ve WBC 16.9 Lactic acid 2.6 PT 15.7 CXR FINDINGS: The cardiomediastinal silhouette is normal.   Emphysematous changes are again seen in both lungs. There is new extensive airspace opacity in the right lower lobe consistent with pneumonia. There is no other focal airspace consolidation. There is no pulmonary edema. There is no significant pleural effusion. There is no pneumothorax   There is no acute osseous abnormality.   IMPRESSION: Right lower lobe pneumonia.  Ed meds: LABA, ICS, LAMA nebs Albuterol Ceftriaxone Mag LR  Seen 4/23 by pulm, Prednisone taper and doxycycline.  Refilled lorazepam   Meds: nebulizer at home 5 times a day, using albuterol inhaler 5x daily.  Lorazepam  Duloxetine Prednisone   Social: Lives with mom, they take care of each other, able to do ADLs,   10/31/22: Feels about the same. Abdominal pain is still present around abdominal mass. Still having some trouble breahting, cough is worse. One episode of hemoptysis previously.

## 2022-10-30 NOTE — Progress Notes (Signed)
[facility]link following for sepsis protocol. 

## 2022-10-30 NOTE — ED Triage Notes (Signed)
From home, hx of COPD. Shortness of breath. Patient reports possible fever over the past few days, no current fever per EMS. Wheezing. 2x duoneb given, 125mg  of solu-medrol given. Patient had no improvement with treatments at home

## 2022-10-30 NOTE — ED Provider Notes (Signed)
[place] EMERGENCY DEPARTMENT AT [facility] Provider Note   CSN: [id number] Arrival date & time: 10/30/22  1021     History  Chief Complaint  Patient presents with   Shortness of Breath    [name] is a 62 y.o. male.   Shortness of Breath  This patient is a 62 year old male known history of COPD, advanced, on 6 L by nasal cannula chronically.  Followed by Dr. Craige Cotta with pulmonology.  Presents with a complaint of increasing shortness of breath, has been getting worse over the last couple of days, he recently finished antibiotics and steroids but states the coughing is worse the wheezing is worse and the shortness of breath was severe this morning.  He was found to be very dyspneic by paramedics who placed him on nebulizer treatment and given Solu-Medrol.  This is not helped very much.  He denies any swelling of his legs, denies chest pain except for the tightness and heaviness that he gets with COPD.  He also has chronic pancreatitis and states that his upper abdomen is hurting as well.      Home Medications Prior to Admission medications   Medication Sig Start Date End Date Taking? Authorizing Provider  albuterol (PROVENTIL) (2.5 MG/3ML) 0.083% nebulizer solution Take 3 mLs by nebulization every 6 (six) hours as needed for wheezing or shortness of breath. 09/02/22   Coralyn Helling, MD  albuterol (VENTOLIN HFA) 108 (90 Base) MCG/ACT inhaler Inhale 2 puffs into the lungs every 6 (six) hours as needed for wheezing or shortness of breath. 10/04/22   Coralyn Helling, MD  amitriptyline (ELAVIL) 50 MG tablet Take 1 tablet (50 mg total) by mouth at bedtime. 05/28/22   Marrianne Mood, MD  cefUROXime (CEFTIN) 500 MG tablet Take 1 tablet (500 mg total) by mouth 2 (two) times daily with a meal. 02/18/22   Coralyn Helling, MD  DULoxetine (CYMBALTA) 30 MG capsule Take 1 capsule (30 mg total) by mouth 2 (two) times daily. 11/28/21   Andrey Campanile, MD  formoterol (PERFOROMIST) 20  MCG/2ML nebulizer solution Take 2 mLs (20 mcg total) by nebulization 2 (two) times daily. 07/01/22   Coralyn Helling, MD  hydrOXYzine (ATARAX) 25 MG tablet Take 1 tablet (25 mg total) by mouth 3 (three) times daily as needed. 07/16/21     LORazepam (ATIVAN) 0.5 MG tablet Take 1 tablet (0.5 mg total) by mouth 2 (two) times daily as needed for anxiety. 10/25/22   Cobb, Ruby Cola, NP  omeprazole (PRILOSEC) 40 MG capsule Take 1 capsule (40 mg total) by mouth daily. 07/12/22   Marrianne Mood, MD  ondansetron (ZOFRAN) 4 MG tablet Take 1 tablet (4 mg total) by mouth every 6 (six) hours. 10/12/21   Charlynne Pander, MD  predniSONE (DELTASONE) 5 MG tablet Take 1 tablet (5 mg total) by mouth daily with breakfast. 06/21/22   Cobb, Ruby Cola, NP  PULMICORT 0.5 MG/2ML nebulizer solution Inhale 2 mLs (0.5 mg total) into the lungs 2 (two) times daily. 12/19/21   Coralyn Helling, MD  revefenacin (YUPELRI) 175 MCG/3ML nebulizer solution Take 3 mLs (175 mcg total) by nebulization daily. 07/09/22 11/07/22  Coralyn Helling, MD  traMADol (ULTRAM) 50 MG tablet Take 2 tablets (100 mg total) by mouth every 6 (six) hours as needed. 09/24/22   Marrianne Mood, MD  sertraline (ZOLOFT) 50 MG tablet Take 1 tablet (50 mg total) by mouth daily. 03/27/20 06/28/20  Theotis Barrio, MD      Allergies  Nsaids    Review of Systems   Review of Systems  Respiratory:  Positive for shortness of breath.   All other systems reviewed and are negative.   Physical Exam Updated Vital Signs BP 124/81   Pulse (!) 108   Temp 98.1 F (36.7 C) (Axillary)   Resp (!) 42   Ht 1.727 m (5\' 8" )   Wt 55.8 kg   SpO2 99%   BMI 18.70 kg/m  Physical Exam Vitals and nursing note reviewed.  Constitutional:      General: He is in acute distress.     Appearance: He is well-developed. He is ill-appearing and toxic-appearing.  HENT:     Head: Normocephalic and atraumatic.     Mouth/Throat:     Pharynx: No oropharyngeal exudate.  Eyes:     General: No  scleral icterus.       Right eye: No discharge.        Left eye: No discharge.     Conjunctiva/sclera: Conjunctivae normal.     Pupils: Pupils are equal, round, and reactive to light.  Neck:     Thyroid: No thyromegaly.     Vascular: No JVD.  Cardiovascular:     Rate and Rhythm: Regular rhythm. Tachycardia present.     Heart sounds: Normal heart sounds. No murmur heard.    No friction rub. No gallop.  Pulmonary:     Effort: Respiratory distress present.     Breath sounds: Wheezing and rhonchi present. No rales.  Abdominal:     General: Bowel sounds are normal. There is no distension.     Palpations: Abdomen is soft. There is no mass.     Tenderness: There is no abdominal tenderness.  Musculoskeletal:        General: No tenderness. Normal range of motion.     Cervical back: Normal range of motion and neck supple.     Right lower leg: No edema.     Left lower leg: No edema.  Lymphadenopathy:     Cervical: No cervical adenopathy.  Skin:    General: Skin is warm and dry.     Findings: No erythema or rash.  Neurological:     Mental Status: He is alert.     Coordination: Coordination normal.  Psychiatric:        Behavior: Behavior normal.     ED Results / Procedures / Treatments   Labs (all labs ordered are listed, but only abnormal results are displayed) Labs Reviewed  COMPREHENSIVE METABOLIC PANEL - Abnormal; Notable for the following components:      Result Value   Chloride 97 (*)    Glucose, Bld 187 (*)    BUN 29 (*)    Albumin 2.5 (*)    All other components within normal limits  CBC WITH DIFFERENTIAL/PLATELET - Abnormal; Notable for the following components:   WBC 16.9 (*)    RBC 4.13 (*)    Neutro Abs 15.8 (*)    Lymphs Abs 0.2 (*)    All other components within normal limits  PROTIME-INR - Abnormal; Notable for the following components:   Prothrombin Time 15.7 (*)    All other components within normal limits  APTT - Abnormal; Notable for the following  components:   aPTT 38 (*)    All other components within normal limits  RESP PANEL BY RT-PCR (RSV, FLU A&B, COVID)  RVPGX2  CULTURE, BLOOD (ROUTINE X 2)  CULTURE, BLOOD (ROUTINE X 2)  MRSA NEXT GEN BY PCR,  NASAL  LACTIC ACID, PLASMA  LACTIC ACID, PLASMA  STREP PNEUMONIAE URINARY ANTIGEN  LEGIONELLA PNEUMOPHILA SEROGP 1 UR AG    EKG EKG Interpretation  Date/Time:  Wednesday Oct 30 2022 10:53:20 EDT Ventricular Rate:  113 PR Interval:  88 QRS Duration: 123 QT Interval:  389 QTC Calculation: 522 R Axis:   248 Text Interpretation: Sinus tachycardia Atrial premature complex Nonspecific IVCD with LAD Abnormal T, consider ischemia, diffuse leads Artifact in lead(s) I II III aVR aVL aVF V5 V6 and baseline wander in lead(s) II III poor quality Confirmed by Eber Hong ([id number]) on 10/30/2022 11:11:31 AM  Radiology DG Chest Port 1 View  Result Date: 10/30/2022 CLINICAL DATA:  Shortness of breath, history of COPD. EXAM: PORTABLE CHEST 1 VIEW COMPARISON:  Chest radiograph 05/31/2022, CT chest 10/10/2022. FINDINGS: The cardiomediastinal silhouette is normal. Emphysematous changes are again seen in both lungs. There is new extensive airspace opacity in the right lower lobe consistent with pneumonia. There is no other focal airspace consolidation. There is no pulmonary edema. There is no significant pleural effusion. There is no pneumothorax There is no acute osseous abnormality. IMPRESSION: Right lower lobe pneumonia. Electronically Signed   By: Lesia Hausen M.D.   On: 10/30/2022 10:58    Procedures .Critical Care  Performed by: Eber Hong, MD Authorized by: Eber Hong, MD   Critical care provider statement:    Critical care time (minutes):  45   Critical care time was exclusive of:  Separately billable procedures and treating other patients and teaching time   Critical care was necessary to treat or prevent imminent or life-threatening deterioration of the following conditions:   Respiratory failure and sepsis   Critical care was time spent personally by me on the following activities:  Development of treatment plan with patient or surrogate, discussions with consultants, evaluation of patient's response to treatment, examination of patient, obtaining history from patient or surrogate, review of old charts, re-evaluation of patient's condition, pulse oximetry, ordering and review of radiographic studies, ordering and review of laboratory studies and ordering and performing treatments and interventions   I assumed direction of critical care for this patient from another provider in my specialty: no     Care discussed with: admitting provider   Comments:           Medications Ordered in ED Medications  lactated ringers infusion ( Intravenous New Bag/Given 10/30/22 1305)  cefTRIAXone (ROCEPHIN) 2 g in sodium chloride 0.9 % 100 mL IVPB (2 g Intravenous New Bag/Given 10/30/22 1202)  azithromycin (ZITHROMAX) 500 mg in sodium chloride 0.9 % 250 mL IVPB (500 mg Intravenous New Bag/Given 10/30/22 1306)  albuterol (PROVENTIL) (2.5 MG/3ML) 0.083% nebulizer solution 10 mg (0 mg Nebulization Stopped 10/30/22 1210)  albuterol (PROVENTIL) (2.5 MG/3ML) 0.083% nebulizer solution 2.5 mg (has no administration in time range)  revefenacin (YUPELRI) nebulizer solution 175 mcg (has no administration in time range)  arformoterol (BROVANA) nebulizer solution 15 mcg (has no administration in time range)  budesonide (PULMICORT) nebulizer solution 0.5 mg (has no administration in time range)  LORazepam (ATIVAN) tablet 0.5 mg (has no administration in time range)  magnesium sulfate IVPB 2 g 50 mL (2 g Intravenous New Bag/Given 10/30/22 1140)  fentaNYL (SUBLIMAZE) injection 50 mcg (50 mcg Intravenous Given 10/30/22 1130)    ED Course/ Medical Decision Making/ A&P  Medical Decision Making Amount and/or Complexity of Data Reviewed Labs: ordered. Radiology:  ordered. ECG/medicine tests: ordered.  Risk Prescription drug management. Decision regarding hospitalization.    This patient presents to the ED for concern of severe respiratory distress, this involves an extensive number of treatment options, and is a complaint that carries with it a high risk of complications and morbidity.  The differential diagnosis includes severe COPD, pneumonia, pneumothorax, less likely to be pulmonary embolism   Co morbidities that complicate the patient evaluation  Severe advanced COPD, oxygen dependent   Additional history obtained:  Additional history obtained from medical record External records from outside source obtained and reviewed including multiple office visits to his pulmonologist, most recently seen by nurse practitioner on April 23 Reviewed notes from palliative care home visit from August 2023 during which time the patient was found to have severe chronic respiratory failure with hypoxia, bullous emphysema, his CODE STATUS was DNR, his goals of care were to maximize quality of life, the patient is willing to be admitted to the hospital as needed   Lab Tests:  I Ordered, and personally interpreted labs.  The pertinent results include: Leukocytosis, 16,900, metabolic panel with low albumin but otherwise reassuring kidney function and electrolytes   Imaging Studies ordered:  I ordered imaging studies including chest x-ray I independently visualized and interpreted imaging which showed large right lower lobe pneumonia I agree with the radiologist interpretation   Cardiac Monitoring: / EKG:  The patient was maintained on a cardiac monitor.  I personally viewed and interpreted the cardiac monitored which showed an underlying rhythm of: Sinus tachycardia   Consultations Obtained:  I requested consultation with the critical care team,  and discussed lab and imaging findings as well as pertinent plan - they recommend: Admission to the  Hospitalist, as the patient is DNR they recommended they will consult and the hospitalist can admit   Problem List / ED Course / Critical interventions / Medication management  This patient is critically ill in severe respiratory distress oxygen in the low 80% on nonrebreather getting a continuous nebulizer.  X-ray shows a large pneumonia which is in need of antibiotics which have been given.  Code sepsis was activated, awaiting lactic acid at this time to verify need for fluids but the patient is not hypotensive. I ordered medication including Rocephin, Zithromax, Solu-Medrol, magnesium, continuous nebulizer therapy for severe respiratory distress with severe bullous emphysema Reevaluation of the patient after these medicines showed that the patient critically ill I have reviewed the patients home medicines and have made adjustments as needed   Social Determinants of Health:  Chronic lung disease, critically ill   Test / Admission - Considered:  Admit to higher level of care         Final Clinical Impression(s) / ED Diagnoses Final diagnoses:  Acute respiratory failure with hypoxia (HCC)  Sepsis without acute organ dysfunction, due to unspecified organism [facility])  Pneumonia of right lower lobe due to infectious organism    Rx / DC Orders ED Discharge Orders     None         Eber Hong, MD 10/30/22 1314

## 2022-10-30 NOTE — H&P (Signed)
Date: 10/30/2022               Patient Name:  [name] MRN: [id number]  DOB: Nov 05, 1960 Age / Sex: 62 y.o., male   PCP: Marrianne Mood, MD         Medical Service: Internal Medicine Teaching Service         Attending Physician: Dr. Ginnie Smart, MD    First Contact: Dr. Willette Cluster Pager: [phone]  Second Contact: Dr. Rudene Christians Pager: [phone]       After Hours (After 5p/  First Contact Pager: [phone]  weekends / holidays): Second Contact Pager: [phone]   Chief Complaint: SOB  History of Present Illness:  [name] is a 62 year old male with a pmh of severe COPD, 2 vessel CAD, anxiety, chronic pancreatitis who presents for sob. Recently seen 4/23 in the pulm clinic with worsening shortness of breath and development of green sputum. Of note had CT chest 4/18 with no acute process. At the time of evaluation he was treated for COPD exacerbation with 7 days of doxycycline, prednisone taper, roflumilast. He says he completed the course of steroids and antibiotics and ran out of his nebulizer meds 5/5, otherwise endorses taking his medications. Since has had worsened wheezing, cough, sputum production. Also endorses associated R  lateral upper abdomen/lower chest pain.Still able to ambulate but has had poor appetite and thirst.   ED Course: Presented afebrile, HDS, tachycardic satting in the low 80s on admission requiring NRB and weaned to 6L [place]. RPP negative. CMP unremarkable. CBC with wbc to 16.9, otherwise unremarkable. Bcx,Strep pneumo and legionella urine antigen pending. Lactate 2.6. CXR with large RLL pneumonia. Treated with azithromycin, ceftriaxone, solumedrol and magnesium.  Meds:  nebulizer at home 5 times a day, using albuterol inhaler 5x daily.  Lorazepam  Duloxetine Prednisone    Allergies: Allergies as of 10/30/2022 - Review Complete 10/30/2022  Allergen Reaction Noted   Nsaids Other (See Comments) 12/30/2016   Past Medical History:  Diagnosis  Date   Chronic pancreatitis (HCC)    Depression    Duodenitis    by EGD 1/07   Emphysema    unclear diagnosis noted in previous EMR   History of alcohol abuse    Hyperlipidemia    Psoriasis    extensive, responds to strong steroids, unable to start molecular therapies due to cost   RUE weakness    2/2 cervical spondylosis   Stomach ulcer     Family History:  Family History  Problem Relation Age of Onset   Diabetes Mother    Heart disease Father    Cancer Father        ?kidney or liver cancer     Social History: Lives at home with his mother who has dementia. Gets outpatient palliative services. Independent in all IADLs/ADLs. Denies tobacco, alcohol or other substance use.  Review of Systems: A complete ROS was negative except as per HPI.   Physical Exam: Blood pressure 111/78, pulse (!) 101, temperature (!) 97.5 F (36.4 C), temperature source Oral, resp. rate (!) 27, height 5\' 8"  (1.727 m), weight 55.8 kg, SpO2 95 %. Gen: frail, chronically and acutely ill appearing, diaphoretic, tremulous, in acute distress EX:[id number], regular rhythm , no m/r/g, 2+ bilateral radial pulses Pulm: increased work of breathing with accessory muscle use, 6L large bore [place], diffuse wheezing throughout all lung fields  Abd: thin, soft, NT,ND, normal active bowel sounds Ext: warm,dry, no LE edema  EKG:  personally reviewed my interpretation is sinus tachycardia, no visualized acute ST/T wave changes although erratic baseline makes interpretation difficult  CXR: personally reviewed my interpretation is RLL pneumonia, no other acute cardiopulmonary process  Assessment & Plan by Problem: Principal Problem:   Acute on chronic hypoxic respiratory failure (HCC) Active Problems:   Acute respiratory failure with hypoxia [facility])  [name] is a 62 year old male with a pmh of severe COPD, 2 vessel CAD, anxiety, chronic pancreatitis who presents for sob and found to have COPD exacerbation.  Acute  Exacerbation of COPD Lactic acidosisTobacco abuse Follows with Dr. Craige Cotta, PFT's from 2020 with FEV1 46%, ratio 57, DLCO 33%. Recently treated for AECOPD 4/24. Ran out of nebulized medications 5/5, since experiencing increased dyspnea, wheezing, SOB, productive cough. On presentation afebrile, HDS, tachycardic satting in the low 80s on admission requiring NRB and weaned to 6L [place]. Of note is on up to 6L at home but this is intermittent. Treated with azithromycin, ceftriaxone, solumedrol and magnesium in the ED. Lactate from 2.6 to 4.5 consistent with lactic acidosis, likely secondary to hypoxemia. At the time of ABG arterial pH 7.34, pO2 74, bicarb 24.8, pCO2 45.Home regimen includes albuterol 3ml neb q6 hrs prn, performist 2ml neb BID, pulmicort 1 vial neb BID, yupelri 3 mL neb daily, prednisone 5 mg daily, guaifenesin 600mg  BID, daliresp daily. -f/u legionella and strep pneumo, MRSA nare -f/u bcx, repeat lactic acid -IV ceftriaxone+ IV azithromycin -IV solumedrol 40mg  daily x 5 days -albuterol nebs q4 hrs prn, brovana nebs BID, pulmicort 0.5mg  nebs BID,yupelri nebs daily -guaifenasin 600mg  BID -BiPAP prn -PCCM following  Anxiety -lorazepam 0.5mg  BID  Chronic pancreatitis -tramadol 100mg  q6hrs prn  GOCProtein calorie malnutrition  Sees palliative care outpatient, last visit 04/2022. May benefit from home hospice. -palliative care consult  Dispo: Admit patient to Inpatient with expected length of stay greater than 2 midnights.  Signed: Willette Cluster, MD 10/30/2022, 3:50 PM  Pager: Willette Cluster, MD Internal Medicine Resident, PGY-1 [facility]  Pager: [phone] After 5pm on weekdays and 1pm on weekends: On Call pager: [phone]

## 2022-10-30 NOTE — ED Notes (Signed)
Respiratory therapy was contacted due to patient's oxygen saturation dropping to 80%. RT came to bedside to assess patient, placed on NRB.

## 2022-10-30 NOTE — Plan of Care (Signed)

## 2022-10-31 DIAGNOSIS — E872 Acidosis, unspecified: Secondary | ICD-10-CM | POA: Diagnosis not present

## 2022-10-31 DIAGNOSIS — Z7189 Other specified counseling: Secondary | ICD-10-CM

## 2022-10-31 DIAGNOSIS — J441 Chronic obstructive pulmonary disease with (acute) exacerbation: Secondary | ICD-10-CM | POA: Diagnosis not present

## 2022-10-31 DIAGNOSIS — F10929 Alcohol use, unspecified with intoxication, unspecified: Secondary | ICD-10-CM

## 2022-10-31 DIAGNOSIS — Z515 Encounter for palliative care: Secondary | ICD-10-CM

## 2022-10-31 DIAGNOSIS — J9621 Acute and chronic respiratory failure with hypoxia: Secondary | ICD-10-CM | POA: Diagnosis not present

## 2022-10-31 DIAGNOSIS — K860 Alcohol-induced chronic pancreatitis: Secondary | ICD-10-CM

## 2022-10-31 DIAGNOSIS — J9601 Acute respiratory failure with hypoxia: Secondary | ICD-10-CM | POA: Diagnosis not present

## 2022-10-31 DIAGNOSIS — F419 Anxiety disorder, unspecified: Secondary | ICD-10-CM

## 2022-10-31 DIAGNOSIS — Z87891 Personal history of nicotine dependence: Secondary | ICD-10-CM | POA: Diagnosis not present

## 2022-10-31 DIAGNOSIS — Z66 Do not resuscitate: Secondary | ICD-10-CM

## 2022-10-31 LAB — BASIC METABOLIC PANEL
Anion gap: 13 (ref 5–15)
BUN: 23 mg/dL — ABNORMAL HIGH (ref 6–20)
CO2: 25 mmol/L (ref 22–32)
Calcium: 8.7 mg/dL — ABNORMAL LOW (ref 8.9–10.3)
Chloride: 99 mmol/L (ref 98–111)
Creatinine, Ser: 0.68 mg/dL (ref 0.61–1.24)
GFR, Estimated: >60 mL/min (ref >60)
Glucose, Bld: 131 mg/dL — ABNORMAL HIGH (ref 70–99)
Potassium: 4.2 mmol/L (ref 3.5–5.1)
Sodium: 137 mmol/L (ref 135–145)

## 2022-10-31 LAB — CULTURE, BLOOD (ROUTINE X 2): Culture: NO GROWTH

## 2022-10-31 LAB — HIV ANTIBODY (ROUTINE TESTING W REFLEX): HIV Screen 4th Generation wRfx: NONREACTIVE

## 2022-10-31 LAB — STREP PNEUMONIAE URINARY ANTIGEN: Strep Pneumo Urinary Antigen: NEGATIVE

## 2022-10-31 LAB — CBC
HCT: 32.9 % — ABNORMAL LOW (ref 39.0–52.0)
Hemoglobin: 10.8 g/dL — ABNORMAL LOW (ref 13.0–17.0)
MCH: 32.3 pg (ref 26.0–34.0)
MCHC: 32.8 g/dL (ref 30.0–36.0)
MCV: 98.5 fL (ref 80.0–100.0)
Platelets: 179 10*3/uL (ref 150–400)
RBC: 3.34 MIL/uL — ABNORMAL LOW (ref 4.22–5.81)
RDW: 12.2 % (ref 11.5–15.5)
WBC: 11.6 10*3/uL — ABNORMAL HIGH (ref 4.0–10.5)
nRBC: 0 % (ref 0.0–0.2)

## 2022-10-31 LAB — LACTIC ACID, PLASMA
Lactic Acid, Venous: 1.4 mmol/L (ref 0.5–1.9)
Lactic Acid, Venous: 1.5 mmol/L (ref 0.5–1.9)

## 2022-10-31 LAB — MRSA NEXT GEN BY PCR, NASAL: MRSA by PCR Next Gen: NOT DETECTED

## 2022-10-31 MED ORDER — MIDAZOLAM HCL 2 MG/2ML IJ SOLN: 2.0000 mg | INTRAMUSCULAR | Status: DC | PRN

## 2022-10-31 MED ORDER — MORPHINE SULFATE (CONCENTRATE) 10 MG/0.5ML PO SOLN: 10.0000 mg | ORAL | 0 refills | Status: DC | PRN

## 2022-10-31 MED ORDER — MIDAZOLAM HCL 2 MG/2ML IJ SOLN
1.0000 mg | INTRAMUSCULAR | Status: DC | PRN
  Administered 2022-10-31: 1 mg via INTRAVENOUS
  Filled 2022-10-31: qty 2

## 2022-10-31 MED ORDER — MORPHINE SULFATE (PF) 2 MG/ML IV SOLN
2.0000 mg | INTRAVENOUS | Status: AC
  Administered 2022-10-31: 2 mg via INTRAVENOUS
  Filled 2022-10-31: qty 1

## 2022-10-31 MED ORDER — MORPHINE SULFATE (PF) 2 MG/ML IV SOLN: 2.0000 mg | INTRAVENOUS | Status: DC | PRN

## 2022-10-31 MED ORDER — PREDNISONE 20 MG PO TABS: 40.0000 mg | ORAL_TABLET | Freq: Every day | ORAL | Status: DC

## 2022-10-31 MED ORDER — MORPHINE SULFATE (CONCENTRATE) 10 MG/0.5ML PO SOLN: 5.0000 mg | ORAL | Status: DC | PRN

## 2022-10-31 MED ORDER — AZITHROMYCIN 250 MG PO TABS
500.0000 mg | ORAL_TABLET | Freq: Every day | ORAL | Status: DC
  Administered 2022-10-31: 500 mg via ORAL
  Filled 2022-10-31: qty 2

## 2022-10-31 MED ORDER — MORPHINE SULFATE (PF) 4 MG/ML IV SOLN
4.0000 mg | INTRAVENOUS | Status: DC | PRN
  Administered 2022-10-31 (×3): 4 mg via INTRAVENOUS
  Filled 2022-10-31 (×3): qty 1

## 2022-10-31 MED ORDER — GUAIFENESIN ER 600 MG PO TB12: 600.0000 mg | ORAL_TABLET | Freq: Two times a day (BID) | ORAL | Status: DC

## 2022-10-31 MED ORDER — MIDAZOLAM HCL 2 MG/2ML IJ SOLN
2.0000 mg | INTRAMUSCULAR | Status: DC | PRN
  Administered 2022-10-31: 2 mg via INTRAVENOUS
  Filled 2022-10-31: qty 2

## 2022-10-31 MED ORDER — MORPHINE SULFATE (PF) 4 MG/ML IV SOLN: 4.0000 mg | INTRAVENOUS | 0 refills | Status: DC | PRN

## 2022-10-31 MED ORDER — MIDAZOLAM HCL 2 MG/2ML IJ SOLN: 2.0000 mg | INTRAMUSCULAR | 0 refills | Status: DC | PRN

## 2022-10-31 MED ORDER — MORPHINE SULFATE (CONCENTRATE) 10 MG/0.5ML PO SOLN: 10.0000 mg | ORAL | Status: DC | PRN

## 2022-10-31 NOTE — Progress Notes (Signed)
RT note. Patient on 4 L [place] sat 94% with labored breathing and accessory muscle  noted.  Patient refusing wearing bipap at this time.RT will place patient on bipap later if needed,  RT will continue to monitor.    10/31/22 0756  Therapy Vitals  Pulse Rate 94  MEWS Score/Color  MEWS Score 0  MEWS Score Color Green  Respiratory Assessment  Assessment Type Mid-treatment  Respiratory Pattern Regular;Labored;Dyspnea at rest  Chest Assessment Chest expansion symmetrical  Bilateral Breath Sounds Expiratory wheezes;Diminished  Oxygen Therapy/Pulse Ox  O2 Device HFNC  O2 Therapy Oxygen humidified  O2 Flow Rate (L/min) 4 L/min  SpO2 93 %

## 2022-10-31 NOTE — TOC Transition Note (Signed)
Transition of Care [facility]) - CM/SW Discharge Note   Patient Details  Name: [name] MRN: [id number] Date of Birth: Oct 12, 1960  [facility] of [facility]) CM/SW Contact:  Leander Rams, LCSW Phone Number: 10/31/2022, 3:53 PM   Clinical Narrative:    Patient will DC to: [facility] Anticipated DC date: 10/31/2022 Family notified: [place] [facility] Transport by: Sharin Mons   Per MD patient ready for DC to [facility]. RN, patient, patient's family, and facility notified of DC. Discharge Summary and FL2 sent to facility. RN to call report prior to discharge [phone] . DC packet on chart. Ambulance transport requested for patient.   CSW will sign off for now as social work intervention is no longer needed. Please consult [place] again if new needs arise.    Final next level of care: Hospice Medical Facility Barriers to Discharge: No Barriers Identified   Patient Goals and CMS Choice      Discharge Placement                Patient chooses bed at:  [facility]) Patient to be transferred to facility by: [facility] Name of family member notified: Alice Patient and family notified of of transfer: 10/31/22  Discharge Plan and Services Additional resources added to the After Visit Summary for     Discharge Planning Services: CM Consult                                 Social Determinants of Health (SDOH) Interventions SDOH Screenings   Food Insecurity: No Food Insecurity (10/30/2022)  Housing: Low Risk  (10/30/2022)  Transportation Needs: No Transportation Needs (10/30/2022)  Utilities: Not At Risk (10/30/2022)  Depression (PHQ2-9): Medium Risk (06/25/2022)  Tobacco Use: Medium Risk (10/30/2022)     Readmission Risk Interventions     No data to display           Oletta Lamas, MSW, LCSWA, LCASA Transitions of Care  Clinical Social Worker I

## 2022-10-31 NOTE — Progress Notes (Signed)
[facility] 3E09 [facility]) Hospital Liaison Note  Referral received from [facility] Gilda Crease for patient interest in hospice at home.  When I arrived to floor to speak with patient, PMT NP Harvest Dark was in room speaking with patient, who was having obvious air hunger.  After discussion with PMT NP regarding current symptom management needs, I educated patient on residential hospice versus hospice at home.  He selected to qualify for residential hospice based on his symptoms and desire for comfort. Patient eligibility was confirmed and patient desires to transfer today.  TOC, patient, patient's family and PMT NP all aware of transfer planned for later this evening.  RN, please send signed and completed DNR with patient.  Please leave all IV's in place for transfer.  Please call report prior to patient leaving floor to [phone].  Thank you for the opportunity to participate in this patient's care.  Doreatha Martin, RN, BSN [facility] Liaison [phone]

## 2022-10-31 NOTE — Progress Notes (Signed)
[facility] here to get patient. Vitals stable. All belongings sent with patient. PIV in place per report.

## 2022-10-31 NOTE — Consult Note (Signed)
Consultation Note Date: 10/31/2022   Patient Name: [name]  DOB: 02/26/1961  MRN: [id number]  Age / Sex: 63 y.o., male  PCP: Marrianne Mood, MD Referring Physician: Ginnie Smart, MD  Reason for Consultation: Establishing goals of care  HPI/Patient Profile: 62 y.o. male  with past medical history of severe COPD, 2 vessel CAD, anxiety, and chronic pancreatitis admitted on 10/30/2022 with shortness of breath. Patient treated for ES COPD. Despite treatment patient continued with extreme shortness of breath and anxiety. PMT consulted to discuss GOC.  Clinical Assessment and Goals of Care: I have reviewed medical records including EPIC notes, labs and imaging, received report from RN, assessed the patient and then met with patient  to discuss diagnosis prognosis, GOC, EOL wishes, disposition and options.  I introduced Palliative Medicine as specialized medical care for people living with serious illness. It focuses on providing relief from the symptoms and stress of a serious illness. The goal is to improve quality of life for both the patient and the family.  We discussed a brief life review of the patient. He tells me about his son and his mother.   We discussed patient's current illness and what it means in the larger context of patient's on-going co-morbidities.  Natural disease trajectory and expectations at EOL were discussed. We discuss his end stage COPD. Patient reports he is miserable. Symptoms very poorly controlled. Patient feels that despite aggressive medical care he is not improving. Patient would like to transition and focus on comfort and symptom management.  Patient has not been able to tolerate any PO intake.   Remained with patient as we attempted to achieve better symptom management through multiple doses of IV morphine and IV versed.  Discussed with hospice liaison and patient - will arrange for transfer to hospice  facility. TOC made aware.  Met with patient's mother and son to share updates on situation and provide support.  Consulted chaplain for bedside support as well.   Questions and concerns were addressed. The family was encouraged to call with questions or concerns.    Primary Decision Maker PATIENT    SUMMARY OF RECOMMENDATIONS   Comfort measures only Transfer to hospice facility this evening Continue IV versed and morphine for management of pain and dyspnea Chaplain support  Code Status/Advance Care Planning: DNR  Additional Recommendations (Limitations, Scope, Preferences): Full Comfort Care  Discharge Planning: Hospice facility      Primary Diagnoses: Present on Admission:  Acute respiratory failure with hypoxia (HCC)  Acute on chronic hypoxic respiratory failure (HCC)   I have reviewed the medical record, interviewed the patient and family, and examined the patient. The following aspects are pertinent.  Past Medical History:  Diagnosis Date   Chronic pancreatitis (HCC)    Depression    Duodenitis    by EGD 1/07   Emphysema    unclear diagnosis noted in previous EMR   History of alcohol abuse    Hyperlipidemia    Psoriasis    extensive, responds to strong steroids, unable to start molecular therapies due to cost   RUE weakness    2/2 cervical spondylosis   Stomach ulcer    Social History   Socioeconomic History   Marital status: Divorced    Spouse name: Not on file   Number of children: Not on file   Years of education: Not on file   Highest education level: Not on file  Occupational History   Not on file  Tobacco Use  Smoking status: Former    Packs/day: 0.10    Years: 40.00    Additional pack years: 0.00    Total pack years: 4.00    Types: Cigarettes    Quit date: 05/19/2018    Years since quitting: 4.4   Smokeless tobacco: Never   Tobacco comments:    4-5 cigs per day   Vaping Use   Vaping Use: Never used  Substance and Sexual Activity    Alcohol use: Not Currently    Comment: quit x 10 yrs.   Drug use: Not Currently    Types: Marijuana    Comment: quit in high school   Sexual activity: Not on file    Comment: trying for disability to gain medicare coverage, has pain contract and warned that if tested positive again for cocaine will not be given narcotics  Other Topics Concern   Not on file  Social History Narrative   Financial assistance approved for 100% discount at [facility] and has [facility] card   Rudell Cobb  March 08, 2010 2:17 PM   Social Determinants of Health   Financial Resource Strain: Not on file  Food Insecurity: No Food Insecurity (10/30/2022)   Hunger Vital Sign    Worried About Running Out of Food in the Last Year: Never true    Ran Out of Food in the Last Year: Never true  Transportation Needs: No Transportation Needs (10/30/2022)   PRAPARE - Administrator, Civil Service (Medical): No    Lack of Transportation (Non-Medical): No  Physical Activity: Not on file  Stress: Not on file  Social Connections: Not on file   Family History  Problem Relation Age of Onset   Diabetes Mother    Heart disease Father    Cancer Father        ?kidney or liver cancer   Scheduled Meds:  arformoterol  15 mcg Nebulization BID   azithromycin  500 mg Oral Daily   budesonide (PULMICORT) nebulizer solution  0.5 mg Nebulization BID   guaiFENesin  600 mg Oral BID    morphine injection  2 mg Intravenous NOW   [START ON 11/01/2022] predniSONE  40 mg Oral Q breakfast   revefenacin  175 mcg Nebulization Daily   rivaroxaban  10 mg Oral Daily   Continuous Infusions:  cefTRIAXone (ROCEPHIN)  IV 2 g (10/31/22 1111)   PRN Meds:.albuterol, LORazepam, morphine CONCENTRATE, traMADol Allergies  Allergen Reactions   Nsaids Other (See Comments)    Bleeding gastritis   Review of Systems  Constitutional:  Positive for activity change, appetite change and fatigue.  Respiratory:  Positive for cough, chest tightness and  shortness of breath.   Gastrointestinal:  Positive for abdominal pain.  Neurological:  Positive for weakness.    Physical Exam Constitutional:      General: He is not in acute distress.    Appearance: He is ill-appearing.  Cardiovascular:     Rate and Rhythm: Tachycardia present.  Pulmonary:     Effort: Tachypnea, accessory muscle usage and respiratory distress present.  Skin:    General: Skin is warm and dry.  Neurological:     Mental Status: He is alert and oriented to person, place, and time.     Vital Signs: BP 112/74 (BP Location: Right Arm)   Pulse 93   Temp 98 F (36.7 C) (Oral)   Resp 17   Ht 5\' 8"  (1.727 m)   Wt 49.9 kg Comment: weight is correct  SpO2 93%  BMI 16.71 kg/m  Pain Scale: 0-10   Pain Score: Asleep   SpO2: SpO2: 93 % O2 Device:SpO2: 93 % O2 Flow Rate: .O2 Flow Rate (L/min): 4 L/min  IO: Intake/output summary:  Intake/Output Summary (Last 24 hours) at 10/31/2022 1337 Last data filed at 10/31/2022 0800 Gross per 24 hour  Intake 2733.73 ml  Output 575 ml  Net 2158.73 ml    LBM: Last BM Date : 10/30/22 (per pt self-report) Baseline Weight: Weight: 55.8 kg Most recent weight: Weight: 49.9 kg (weight is correct)     Palliative Assessment/Data: PPS 20%     *Please note that this is a verbal dictation therefore any spelling or grammatical errors are due to the "Dragon [employer] One" system interpretation.  Time In: 1300 Time Out: 1600 Time Total: 180 minutes Greater than 50%  of this time was spent counseling and coordinating care related to the above assessment and plan.  Gerlean Ren, DNP, AGNP-C Palliative Medicine Team [phone] Pager: [phone]

## 2022-10-31 NOTE — TOC Initial Note (Signed)
Transition of Care [facility]) - Initial/Assessment Note    Patient Details  Name: [name] MRN: [id number] Date of Birth: May 11, 1961  [facility]) CM/SW Contact:    Lockie Pares, RN Phone Number: 10/31/2022, 1:35 PM  Clinical Narrative:                 62 year old man, DNR with ES COPD, on oxygen at home 6 L.  Lives with his mother and is followed by [facility] palliative. Palliative spoke to him regarding GOC, he is ready to speak to hospice. Notified Rayetta Pigg with hospice via securechat. She will speak to him and his mother about hospice.  TOC will follow for needs, recommendations, and transitions of care  Expected Discharge Plan: Home w Hospice Care Barriers to Discharge: Continued Medical Work up   Patient Goals and CMS Choice            Expected Discharge Plan and Services   Discharge Planning Services: CM Consult   Living arrangements for the past 2 months: Single Family Home                                      Prior Living Arrangements/Services Living arrangements for the past 2 months: Single Family Home Lives with:: Parents Patient language and need for interpreter reviewed:: Yes        Need for Family Participation in Patient Care: Yes (Comment) Care giver support system in place?: Yes (comment) Current home services: DME (Palliative with [facility]) Criminal Activity/Legal Involvement Pertinent to Current Situation/Hospitalization: No - Comment as needed  Activities of Daily Living Home Assistive Devices/Equipment: Nebulizer, Oxygen, Shower chair with back ADL Screening (condition at time of admission) Patient's cognitive ability adequate to safely complete daily activities?: Yes Is the patient deaf or have difficulty hearing?: No Does the patient have difficulty seeing, even when wearing glasses/contacts?: No Does the patient have difficulty concentrating, remembering, or making decisions?: No Patient able to express need for assistance  with ADLs?: Yes Does the patient have difficulty dressing or bathing?: No Independently performs ADLs?: Yes (appropriate for developmental age) Does the patient have difficulty walking or climbing stairs?: No Weakness of Legs: None Weakness of Arms/Hands: None  Permission Sought/Granted                  Emotional Assessment       Orientation: : Oriented to Self, Oriented to Place, Oriented to Situation   Psych Involvement: No (comment)  Admission diagnosis:  Acute respiratory failure with hypoxia (HCC) [J96.01] Pneumonia of right lower lobe due to infectious organism [J18.9] Sepsis without acute organ dysfunction, due to unspecified organism (HCC) [A41.9] Acute on chronic hypoxic respiratory failure (HCC) [J96.21] Patient Active Problem List   Diagnosis Date Noted   Acute on chronic hypoxic respiratory failure (HCC) 10/30/2022   Diarrhea 06/21/2022   Chronic respiratory failure with hypoxia (HCC) 09/28/2021   COPD with acute exacerbation (HCC) 09/17/2021   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   Dyspnea on exertion 05/15/2020   Healthcare maintenance 05/15/2020   Goals of care, counseling/discussion 11/09/2019   Depression    Preventative health care 06/10/2018   Lung nodule 05/26/2018   COPD with emphysema (HCC)    Acute respiratory failure with hypoxia (HCC)    Tobacco abuse    History of pneumothorax 05/19/2017   Chronic cholecystitis with calculus s/p lap cholecystectomy 11/28/2016 11/28/2016  Psoriasis    Anxiety 08/29/2008   Chronic pancreatitis due to acute alcohol intoxication (HCC) 04/13/2008   Dyslipidemia 08/13/2007   PCP:  Marrianne Mood, MD Pharmacy:   [place] [facility] [address] [place] [address] Phone: [phone] Fax: [phone]     Social Determinants of Health (SDOH) Social History: SDOH Screenings   Food Insecurity: No Food Insecurity (10/30/2022)  Housing: Low Risk  (10/30/2022)   Transportation Needs: No Transportation Needs (10/30/2022)  Utilities: Not At Risk (10/30/2022)  Depression (PHQ2-9): Medium Risk (06/25/2022)  Tobacco Use: Medium Risk (10/30/2022)   SDOH Interventions:     Readmission Risk Interventions     No data to display

## 2022-10-31 NOTE — Discharge Summary (Signed)
Name: [name] MRN: [id number] DOB: Aug 21, 1960 62 y.o. PCP: Marrianne Mood, MD  Date of Admission: 10/30/2022 10:21 AM Date of Discharge: No discharge date for patient encounter. Attending Physician: Ginnie Smart, MD  Discharge Diagnosis: 1. Principal Problem:   Acute on chronic hypoxic respiratory failure (HCC) Active Problems:   Anxiety   Chronic pancreatitis due to acute alcohol intoxication (HCC)   COPD with emphysema (HCC)   Acute respiratory failure with hypoxia (HCC)   Tobacco abuse   Depression   Goals of care, counseling/discussion   Discharge Medications: Allergies as of 10/31/2022       Reactions   Nsaids Other (See Comments)   Bleeding gastritis        Medication List     STOP taking these medications    cefUROXime 500 MG tablet Commonly known as: CEFTIN   formoterol 20 MCG/2ML nebulizer solution Commonly known as: PERFOROMIST   omeprazole 40 MG capsule Commonly known as: PRILOSEC   predniSONE 5 MG tablet Commonly known as: DELTASONE   Pulmicort 0.5 MG/2ML nebulizer solution Generic drug: budesonide   Yupelri 175 MCG/3ML nebulizer solution Generic drug: revefenacin       TAKE these medications    albuterol (2.5 MG/3ML) 0.083% nebulizer solution Commonly known as: PROVENTIL Take 3 mLs by nebulization every 6 (six) hours as needed for wheezing or shortness of breath.   Ventolin HFA 108 (90 Base) MCG/ACT inhaler Generic drug: albuterol Inhale 2 puffs into the lungs every 6 (six) hours as needed for wheezing or shortness of breath.   amitriptyline 50 MG tablet Commonly known as: ELAVIL Take 1 tablet (50 mg total) by mouth at bedtime.   DULoxetine 30 MG capsule Commonly known as: Cymbalta Take 1 capsule (30 mg total) by mouth 2 (two) times daily.   guaiFENesin 600 MG 12 hr tablet Commonly known as: MUCINEX Take 1 tablet (600 mg total) by mouth 2 (two) times daily.   hydrOXYzine 25 MG tablet Commonly known as:  ATARAX Take 1 tablet (25 mg total) by mouth 3 (three) times daily as needed.   LORazepam 0.5 MG tablet Commonly known as: ATIVAN Take 1 tablet (0.5 mg total) by mouth 2 (two) times daily as needed for anxiety.   midazolam 2 MG/2ML Soln injection Commonly known as: VERSED Inject 2 mLs (2 mg total) into the vein every 2 (two) hours as needed (anxiety).   morphine (PF) 4 MG/ML injection Inject 1 mL (4 mg total) into the vein every hour as needed for moderate pain or severe pain (dyspnea).   morphine CONCENTRATE 10 MG/0.5ML Soln concentrated solution Place 0.5 mLs (10 mg total) under the tongue every 2 (two) hours as needed for severe pain, moderate pain or shortness of breath.   ondansetron 4 MG tablet Commonly known as: ZOFRAN Take 1 tablet (4 mg total) by mouth every 6 (six) hours.   traMADol 50 MG tablet Commonly known as: Ultram Take 2 tablets (100 mg total) by mouth every 6 (six) hours as needed.        Disposition and follow-up:   [name] was discharged from [facility] in Serious condition.  At the hospital follow up visit please address:  1.   End stage COPD -comfort care per hospice  2.  Labs / imaging needed at time of follow-up: NA  3.  Pending labs/ test needing follow-up: NA  Follow-up Appointments:   Hospital Course by problem list: 1.  [name] is a 62  y.o. male living with severe COPD on supplemental oxygen, 2 vessel CAD, anxiety, chronic pancreatitis who presented for SOB and admitted for COPD exacerbation in the setting of RLL pneumonia.   # Bullous Emphysema # Acute exacerbation of COPD # RLL Pneumonia # Lactic acidosis, resolved Follows with Dr. Craige Cotta, PFT's from 2020 with FEV1 46%, ratio 57, DLCO 33%. Recently treated for AECOPD 4/24. Ran out of nebulized medications 5/5, since experiencing increased dyspnea, wheezing, SOB, productive cough. On presentation afebrile, HDS, tachycardic satting in the low 80s on  admission requiring NRB and weaned to 6L [place]. Of note is on up to 6L at home but this is intermittent. Treated with azithromycin, ceftriaxone, solumedrol and magnesium in the ED. Lactate from 2.6 to 4.5 consistent with lactic acidosis, likely secondary to hypoxemia. At the time of ABG arterial pH 7.34, pO2 74, bicarb 24.8, pCO2 45.Home regimen includes albuterol 3ml neb q6 hrs prn, performist 2ml neb BID, pulmicort 1 vial neb BID, yupelri 3 mL neb daily, prednisone 5 mg daily, guaifenesin 600mg  BID, daliresp daily. Patient received 1.5L LR overnight for lactic acidosis, which is resolved with lactic acid 1.5 this morning. Leukocytosis improved at 11.6 from 16.9. Blood and sputum cultures pending. Transitioned from IV to PO azithromycin 500 mg daily. Transitioned from IV solumedrol to PO prednisone 40 mg. Continued all home LAMA/LABA/ICS nebulized meds. Palliative care consulted during admission, patient discharged to [facility] today for hospice care inpatient.   # Anxiety Patient takes lorazepam at home since anxiety contributes to increased dyspnea at times. Continue lorazepam 0.5 mg BID PRN.    # Chronic pancreatitis Patient reports continued RUQ pain from chronic pancreatitis. Encouraged patient to inform nurse when pain is severe so he can receive medications. Continued tramadol 100 mg Q6h PRN for pain control.    # GOC/Protein calorie malnutrition Patient previously received palliative care outpatient but was lost to follow-up once his provider switched jobs. Palliative followed during admission and helped with transition to [facility] hospice bed today.  Discharge Exam:   BP 112/74 (BP Location: Right Arm)   Pulse 93   Temp 98 F (36.7 C) (Oral)   Resp 17   Ht 5\' 8"  (1.727 m)   Wt 49.9 kg Comment: weight is correct  SpO2 93%   BMI 16.71 kg/m  Discharge exam:  Constitutional: Chronically ill-appearing male sitting up in bed. Appears tired, uncomfortable, and  tremulous. Cardiovascular: Heart sounds distant with tachycardic rate, regular rhythm. No murmurs, rubs, or gallops. Pulmonary: Increased respiratory effort on 4L [place] with accessory muscle use. Expiratory wheezes and coarse rhonchi bilaterally. Abdominal: Soft. Non-distended. Tenderness in RUQ. Normal bowel sounds.  Neurological: Alert and oriented to person, place, and time. Skin: Warm and dry.  Pertinent Labs, Studies, and Procedures:     Latest Ref Rng & Units 10/31/2022    7:04 AM 10/30/2022   10:33 AM 10/12/2021    1:50 PM  CBC  WBC 4.0 - 10.5 K/uL 11.6  16.9  16.7   Hemoglobin 13.0 - 17.0 g/dL 40.9  81.1  91.4   Hematocrit 39.0 - 52.0 % 32.9  40.5  43.9   Platelets 150 - 400 K/uL 179  200  204        Latest Ref Rng & Units 10/31/2022    7:04 AM 10/30/2022   10:33 AM 10/15/2022   11:26 AM  BMP  Glucose 70 - 99 mg/dL 782  956  80   BUN 6 - 20 mg/dL 23  29  19   Creatinine 0.61 - 1.24 mg/dL 4.09  8.11  9.14   Sodium 135 - 145 mmol/L 137  135  141   Potassium 3.5 - 5.1 mmol/L 4.2  4.3  4.0   Chloride 98 - 111 mmol/L 99  97  105   CO2 22 - 32 mmol/L 25  24  24    Calcium 8.9 - 10.3 mg/dL 8.7  9.1  9.7    DG Chest Port 1 View  Result Date: 10/30/2022 CLINICAL DATA:  Shortness of breath, history of COPD. EXAM: PORTABLE CHEST 1 VIEW COMPARISON:  Chest radiograph 05/31/2022, CT chest 10/10/2022. FINDINGS: The cardiomediastinal silhouette is normal. Emphysematous changes are again seen in both lungs. There is new extensive airspace opacity in the right lower lobe consistent with pneumonia. There is no other focal airspace consolidation. There is no pulmonary edema. There is no significant pleural effusion. There is no pneumothorax There is no acute osseous abnormality. IMPRESSION: Right lower lobe pneumonia. Electronically Signed   By: Lesia Hausen M.D.   On: 10/30/2022 10:58     Discharge Instructions: Discharge Instructions     Diet - low sodium heart healthy   Complete by: As directed     Increase activity slowly   Complete by: As directed        Signed: Willette Cluster, MD 10/31/2022, 3:27 PM   Pager: Willette Cluster, MD Internal Medicine Resident, PGY-1 [facility]  Pager: [phone]

## 2022-10-31 NOTE — Progress Notes (Signed)
NAME:  [name], MRN:  [id number], DOB:  Jul 23, 1960, LOS: 1 ADMISSION DATE:  10/30/2022, CONSULTATION DATE:  10/30/22 REFERRING MD:  Hyacinth Meeker CHIEF COMPLAINT:  Dyspnea   History of Present Illness:  [name] is a 62 y.o. M with PMH of severe COPD on home O2 and followed by Dr. Craige Cotta (PFT's from 2020 with FEV1 46%, ratio 57, DLCO 33%).  He presented to [facility] ED 5/8 with dyspnea, worsening cough and wheeze, along with increased WOB.    Last seen in office 4/23 for acute visit, worsening SOB x 3 weeks, with productive greenish sputum, CT 10/10/22 neg, doxy and steroid taper, and started on daliresp.  Given short refill of his ativan he takes for chronic dyspnea, which greatly helps per patient, and was mention of reaching back out to palliative care for symptom management as pt had lost follow up since November 2023 (provider he was seeing had left the practice).  He completed abx course and tapered back to his baseline of 5mg  prednisone daily and was feeling better but ran out of his nebulizer's on Sunday, 5/5 along with his ativan and since developed chills, worsening productive cough with sticky greenish sputum and blood streaked, wheezing, SOB.  Denies any LE edema or sick exposures.  Is not taking methotrexate anymore.  Not taking any anticoagulants or ASA.  Unable to recall how many liters of home O2 he is on, last visit charted 3-4L. Confirms his DNR/ DNI status.    In ER, requiring up to NRB since weaned down to 6L O2, afebrile, tachycardic and normotensive.  Treated with nebs, solumedrol, magnesium.  CXR demonstrated RLL PNA.  WBC 16.9, Hgb wnl, respiratory panel pending.  Zithromax and ceftriaxone started.  PCCM asked to see in consultation for further recs.  Pertinent  Medical History  COPD with bullous emphysema, chronic hypoxic respiratory failure on , former smoker, pancreatitis, chronic cholecystitis, psoriasis on methotrexate, HLD, depression  04/06/2019 PFTs: FVC 85%, FEV1 46%, ratio  57, TLC 107%, DLCOunc 33%. Severe obstructive airway disease with increased lung volumes and severe diffusion defect. No BD   Significant Hospital Events: Including procedures, antibiotic start and stop dates in addition to other pertinent events   5/8 admit.  Interim History / Subjective:  Remains extremely jittery and short of breath Objective   Blood pressure 111/76, pulse 94, temperature 97.9 F (36.6 C), temperature source Oral, resp. rate 18, height 5\' 8"  (1.727 m), weight 49.9 kg, SpO2 93 %.        Intake/Output Summary (Last 24 hours) at 10/31/2022 0930 Last data filed at 10/31/2022 0800 Gross per 24 hour  Intake 2733.73 ml  Output 575 ml  Net 2158.73 ml   Filed Weights   10/30/22 1031 10/31/22 0442  Weight: 55.8 kg 49.9 kg   Examination: Nervous and jittery and in respiratory distress No JVD is appreciated Coarse rhonchi bilaterally Decreased air movement Heart sounds are distant Abdomen soft nontender Extremities with edema   Assessment & Plan:   AECOPD AoC hypoxic respiratory failure (on home O2 chronically) - 2/2 AECOPD and RLL PNA. O2 as needed  Noninvasive as needed   WOB.  Remains DNI confirmed with pt.  Continue Zithromax and Rocephin Follow culture data Negative viral panel Steroids Inhaled steroids and bronchodilators As needed Ativan Pulmonary will continue to follow       Ongoing tobacco dependence. Needs to stop smoking    Hx lung nodule - stable. Monitor   Goals of care: Per our  last outpatient pulmonary note, pt had been seeing palliative care as an outpatient, last seen November 2023 then lost to follow up after provider left practice. There was mention of re-engaging palliative care as of 10/15/22. - Will place palliative care consult while he is inpatient and continue his ativan 0.5mg  BID prn - patient confirms his code status of DNI/ DNR   Best Practice (right click and "Reselect all SmartList Selections" daily)   Diet/type:  Regular consistency (see orders) DVT prophylaxis: prophylactic heparin  GI prophylaxis: PPI Lines: N/A Foley:  N/A Code Status:  DNR Last date of multidisciplinary goals of care discussion [10/30/22]  Labs   CBC: Recent Labs  Lab 10/30/22 1033 10/31/22 0704  WBC 16.9* 11.6*  NEUTROABS 15.8*  --   HGB 13.5 10.8*  HCT 40.5 32.9*  MCV 98.1 98.5  PLT 200 179    Basic Metabolic Panel: Recent Labs  Lab 10/30/22 1033 10/31/22 0704  NA 135 137  K 4.3 4.2  CL 97* 99  CO2 24 25  GLUCOSE 187* 131*  BUN 29* 23*  CREATININE 1.05 0.68  CALCIUM 9.1 8.7*   GFR: Estimated Creatinine Clearance: 69.3 mL/min (by C-G formula based on SCr of 0.68 mg/dL). Recent Labs  Lab 10/30/22 1033 10/30/22 1145 10/30/22 1926 10/30/22 2238 10/31/22 0253 10/31/22 0704  WBC 16.9*  --   --   --   --  11.6*  LATICACIDVEN  --    < > 4.8* 2.7* 1.4 1.5   < > = values in this interval not displayed.    Liver Function Tests: Recent Labs  Lab 10/30/22 1033  AST 39  ALT 26  ALKPHOS 87  BILITOT 0.3  PROT 7.0  ALBUMIN 2.5*   No results for input(s): "LIPASE", "AMYLASE" in the last 168 hours. No results for input(s): "AMMONIA" in the last 168 hours.  ABG    Component Value Date/Time   PHART 7.34 (L) 10/30/2022 1548   PCO2ART 45 10/30/2022 1548   PO2ART 74 (L) 10/30/2022 1548   HCO3 24.8 10/30/2022 1548   TCO2 16 12/05/2009 1124   ACIDBASEDEF 1.3 10/30/2022 1548   O2SAT 97 10/30/2022 1548     Coagulation Profile: Recent Labs  Lab 10/30/22 1033  INR 1.2    Cardiac Enzymes: No results for input(s): "CKTOTAL", "CKMB", "CKMBINDEX", "TROPONINI" in the last 168 hours.  HbA1C: No results found for: "HGBA1C"  CBG: No results for input(s): "GLUCAP" in the last 168 hours.    Brett Canales Darlin Stenseth ACNP Acute Care Nurse Practitioner [facility] Please consult Amion 10/31/2022, 9:32 AM

## 2022-10-31 NOTE — Progress Notes (Signed)
Subjective:  Patient feels about the same today. He reports continued difficulty breathing with worsening cough, though no recent hemoptysis. Nebulizers/inhalers provide some relief. RUQ abdominal pain is still present and feels like a knot that tightens with each breath. He denies fevers and chills.  Objective:  Vital signs in last 24 hours: Vitals:   10/30/22 2241 10/31/22 0442 10/31/22 0735 10/31/22 0756  BP: 100/74 96/63 111/76   Pulse: 98 90 94 94  Resp: 20 20 18    Temp: 98.2 F (36.8 C) 98.2 F (36.8 C) 97.9 F (36.6 C)   TempSrc: Oral Oral Oral   SpO2: 93% 90% 93% 93%  Weight:  49.9 kg    Height:       Intake/Output Summary (Last 24 hours) at 10/31/2022 0941 Last data filed at 10/31/2022 0800 Gross per 24 hour  Intake 2733.73 ml  Output 575 ml  Net 2158.73 ml   Physical Exam: Constitutional: Chronically ill-appearing male sitting up in bed. Appears tired, uncomfortable, and tremulous. Cardiovascular: Heart sounds distant with tachycardic rate, regular rhythm. No murmurs, rubs, or gallops. Pulmonary: Increased respiratory effort on 4L [place] with accessory muscle use. Expiratory wheezes and coarse rhonchi bilaterally. Abdominal: Soft. Non-distended. Tenderness in RUQ. Normal bowel sounds.  Neurological: Alert and oriented to person, place, and time. Skin: Warm and dry.   Assessment/Plan:  Principal Problem:   Acute on chronic hypoxic respiratory failure (HCC) Active Problems:   Acute respiratory failure with hypoxia (HCC)  [name] is a 62 y.o. male living with severe COPD on supplemental oxygen, 2 vessel CAD, anxiety, chronic pancreatitis who presented for SOB and admitted for COPD exacerbation in the setting of RLL pneumonia.  # Bullous Emphysema # Acute exacerbation of COPD # RLL Pneumonia # Lactic acidosis, resolved Patient currently saturating well on 4L [place]. He continues to have increased work of breathing with bilateral wheezes and rhonchi on exam.  Encouraged patient to request albuterol nebulizer PRN instead of using his home inhaler. Patient received 1.5L LR overnight for lactic acidosis, which is now resolved with lactic acid 1.5 this morning. Leukocytosis improving at 11.6 from 16.9. Blood and sputum cultures pending. Transitioned from IV to PO azithromycin 500 mg daily. Transitioned from IV solumedrol to PO prednisone 40 mg. Will continue bronchodilators.  - Appreciate PCCM recommendations - f/u legionella urine antigen - f/u blood cultures - f/u sputum culture - IV ceftriaxone 2 g daily + PO azithromycin 500 mg daily - prednisone 40 mg (day 2 of 5) - albuterol nebs Q4h PRN, brovana nebs BID, pulmicort 0.5mg  nebs BID, yupelri nebs daily - Guaifenesin 600mg  BID - BiPAP PRN  - O2 goal 88-92%  # Anxiety Patient takes lorazepam at home since anxiety contributes to increased dyspnea at times. Will continue lorazepam 0.5 mg BID PRN. - lorazepam 0.5 mg BID PRN.  # Chronic pancreatitis Patient reports continued RUQ pain from chronic pancreatitis. Encouraged patient to inform nurse when pain is severe so he can receive medications. Will continue tramadol 100 mg Q6h PRN for pain control. - tramadol 100 mg Q6h PRN   # GOC/Protein calorie malnutrition Patient previously received palliative care outpatient but was lost to follow-up once his provider switched jobs. He is agreeable to talking with palliative care while inpatient to see what services may be available. May benefit from home hospice. Nutrition consulted for evaluation. - Palliative care consult  Diet: regular Bowel: none VTE: Xarelto IVF: none Code: DNR/DNI PT/OT recs: none LOS: day 1  Prior to  Admission Living Arrangement: home with mother who has dementia Anticipated Discharge Location: TBD Barriers to Discharge: continued management  Whitman Hero, Medical Student 10/31/2022, 9:41 AM

## 2022-10-31 NOTE — Progress Notes (Signed)
Report called to [facility], spoke with Pam/RN.

## 2022-10-31 NOTE — Progress Notes (Signed)
This chaplain responded to PMT NP-Shae consult for EOL spiritual care. The chaplain understands the Pt. is transferring to residential hospice at [facility].   The chaplain introduced herself to the Pt. and family with the goal of building rapport. Sitting with the Pt. and family as the medical team managed the Pt. symptoms allowed the chaplain to listen reflectively to the Pt. story and what is important to him today. The chaplain affirmed his thoughts and offered the Pt. the space to practice his important conversations.  The chaplain understands the Pt. prefers to transfer to [facility] in his clothes from home, which his family is bringing him. The chaplain provided education to the family on how to contact spiritual care if follow up is needed.  Chaplain Stephanie Acre [phone]

## 2022-11-01 LAB — CULTURE, BLOOD (ROUTINE X 2): Special Requests: ADEQUATE

## 2022-11-01 LAB — LEGIONELLA PNEUMOPHILA SEROGP 1 UR AG: L. pneumophila Serogp 1 Ur Ag: NEGATIVE

## 2022-11-01 LAB — CULTURE, RESPIRATORY W GRAM STAIN

## 2022-11-02 LAB — CULTURE, RESPIRATORY W GRAM STAIN: Culture: NORMAL

## 2022-11-02 LAB — CULTURE, BLOOD (ROUTINE X 2): Special Requests: ADEQUATE

## 2022-11-04 ENCOUNTER — Other Ambulatory Visit (HOSPITAL_COMMUNITY): Payer: Self-pay

## 2022-11-04 LAB — CULTURE, BLOOD (ROUTINE X 2): Culture: NO GROWTH

## 2022-11-23 DEATH — deceased

## 2023-10-16 ENCOUNTER — Encounter: Payer: Self-pay | Admitting: Acute Care

## 2023-10-21 ENCOUNTER — Encounter: Payer: Self-pay | Admitting: Acute Care

## 2023-10-21 ENCOUNTER — Other Ambulatory Visit: Payer: Self-pay | Admitting: Acute Care

## 2023-10-21 DIAGNOSIS — Z87891 Personal history of nicotine dependence: Secondary | ICD-10-CM

## 2023-10-21 DIAGNOSIS — Z122 Encounter for screening for malignant neoplasm of respiratory organs: Secondary | ICD-10-CM

## 2023-11-06 ENCOUNTER — Other Ambulatory Visit

## 2024-03-22 ENCOUNTER — Other Ambulatory Visit (HOSPITAL_COMMUNITY): Payer: Self-pay
